# Patient Record
Sex: Female | Born: 1955 | Race: White | Hispanic: No | Marital: Married | Smoking: Former smoker
Health system: Southern US, Community
[De-identification: ages and names within clinical notes are randomized; demographics above are authoritative.]

## PROBLEM LIST (undated history)

## (undated) DIAGNOSIS — K219 Gastro-esophageal reflux disease without esophagitis: Secondary | ICD-10-CM

## (undated) DIAGNOSIS — F419 Anxiety disorder, unspecified: Secondary | ICD-10-CM

## (undated) DIAGNOSIS — I619 Nontraumatic intracerebral hemorrhage, unspecified: Secondary | ICD-10-CM

## (undated) DIAGNOSIS — D649 Anemia, unspecified: Secondary | ICD-10-CM

## (undated) DIAGNOSIS — F32A Depression, unspecified: Secondary | ICD-10-CM

## (undated) DIAGNOSIS — M879 Osteonecrosis, unspecified: Secondary | ICD-10-CM

## (undated) DIAGNOSIS — F329 Major depressive disorder, single episode, unspecified: Secondary | ICD-10-CM

## (undated) DIAGNOSIS — D709 Neutropenia, unspecified: Secondary | ICD-10-CM

## (undated) DIAGNOSIS — G629 Polyneuropathy, unspecified: Secondary | ICD-10-CM

## (undated) DIAGNOSIS — I639 Cerebral infarction, unspecified: Secondary | ICD-10-CM

## (undated) DIAGNOSIS — I10 Essential (primary) hypertension: Secondary | ICD-10-CM

## (undated) DIAGNOSIS — B370 Candidal stomatitis: Secondary | ICD-10-CM

## (undated) DIAGNOSIS — R51 Headache: Secondary | ICD-10-CM

## (undated) DIAGNOSIS — C801 Malignant (primary) neoplasm, unspecified: Secondary | ICD-10-CM

## (undated) DIAGNOSIS — M1990 Unspecified osteoarthritis, unspecified site: Secondary | ICD-10-CM

## (undated) DIAGNOSIS — K5790 Diverticulosis of intestine, part unspecified, without perforation or abscess without bleeding: Secondary | ICD-10-CM

## (undated) DIAGNOSIS — R519 Headache, unspecified: Secondary | ICD-10-CM

## (undated) DIAGNOSIS — R569 Unspecified convulsions: Secondary | ICD-10-CM

## (undated) DIAGNOSIS — R5081 Fever presenting with conditions classified elsewhere: Secondary | ICD-10-CM

## (undated) DIAGNOSIS — Z8719 Personal history of other diseases of the digestive system: Secondary | ICD-10-CM

## (undated) HISTORY — DX: Cerebral infarction, unspecified: I63.9

## (undated) HISTORY — PX: MASTECTOMY: SHX3

## (undated) HISTORY — DX: Unspecified convulsions: R56.9

---

## 1981-07-14 HISTORY — PX: TUBAL LIGATION: SHX77

## 1984-07-14 DIAGNOSIS — I619 Nontraumatic intracerebral hemorrhage, unspecified: Secondary | ICD-10-CM

## 1984-07-14 HISTORY — DX: Nontraumatic intracerebral hemorrhage, unspecified: I61.9

## 1984-07-14 HISTORY — PX: CEREBRAL ANGIOGRAM: SHX1326

## 2016-03-28 DIAGNOSIS — C50912 Malignant neoplasm of unspecified site of left female breast: Secondary | ICD-10-CM

## 2016-04-13 HISTORY — PX: OTHER SURGICAL HISTORY: SHX169

## 2016-05-01 DIAGNOSIS — Z171 Estrogen receptor negative status [ER-]: Secondary | ICD-10-CM

## 2016-05-01 DIAGNOSIS — C50812 Malignant neoplasm of overlapping sites of left female breast: Secondary | ICD-10-CM

## 2016-05-01 DIAGNOSIS — C7951 Secondary malignant neoplasm of bone: Secondary | ICD-10-CM

## 2016-05-09 DIAGNOSIS — C50812 Malignant neoplasm of overlapping sites of left female breast: Secondary | ICD-10-CM

## 2016-05-25 DIAGNOSIS — C50812 Malignant neoplasm of overlapping sites of left female breast: Secondary | ICD-10-CM

## 2016-05-25 DIAGNOSIS — K219 Gastro-esophageal reflux disease without esophagitis: Secondary | ICD-10-CM | POA: Diagnosis not present

## 2016-05-25 DIAGNOSIS — I10 Essential (primary) hypertension: Secondary | ICD-10-CM | POA: Diagnosis not present

## 2016-05-25 DIAGNOSIS — R5081 Fever presenting with conditions classified elsewhere: Secondary | ICD-10-CM

## 2016-05-25 DIAGNOSIS — F419 Anxiety disorder, unspecified: Secondary | ICD-10-CM | POA: Diagnosis not present

## 2016-05-25 DIAGNOSIS — C50919 Malignant neoplasm of unspecified site of unspecified female breast: Secondary | ICD-10-CM

## 2016-05-25 DIAGNOSIS — C7951 Secondary malignant neoplasm of bone: Secondary | ICD-10-CM

## 2016-05-25 DIAGNOSIS — D709 Neutropenia, unspecified: Secondary | ICD-10-CM | POA: Diagnosis not present

## 2016-05-25 DIAGNOSIS — D701 Agranulocytosis secondary to cancer chemotherapy: Secondary | ICD-10-CM

## 2016-05-25 DIAGNOSIS — B370 Candidal stomatitis: Secondary | ICD-10-CM | POA: Diagnosis not present

## 2016-05-26 DIAGNOSIS — C50919 Malignant neoplasm of unspecified site of unspecified female breast: Secondary | ICD-10-CM | POA: Diagnosis not present

## 2016-05-26 DIAGNOSIS — D709 Neutropenia, unspecified: Secondary | ICD-10-CM | POA: Diagnosis not present

## 2016-05-26 DIAGNOSIS — C7951 Secondary malignant neoplasm of bone: Secondary | ICD-10-CM | POA: Diagnosis not present

## 2016-05-26 DIAGNOSIS — I10 Essential (primary) hypertension: Secondary | ICD-10-CM | POA: Diagnosis not present

## 2016-05-27 DIAGNOSIS — D709 Neutropenia, unspecified: Secondary | ICD-10-CM | POA: Diagnosis not present

## 2016-05-27 DIAGNOSIS — C7951 Secondary malignant neoplasm of bone: Secondary | ICD-10-CM | POA: Diagnosis not present

## 2016-05-27 DIAGNOSIS — B370 Candidal stomatitis: Secondary | ICD-10-CM

## 2016-05-27 DIAGNOSIS — C50919 Malignant neoplasm of unspecified site of unspecified female breast: Secondary | ICD-10-CM | POA: Diagnosis not present

## 2016-05-27 DIAGNOSIS — I10 Essential (primary) hypertension: Secondary | ICD-10-CM | POA: Diagnosis not present

## 2016-05-28 DIAGNOSIS — I10 Essential (primary) hypertension: Secondary | ICD-10-CM | POA: Diagnosis not present

## 2016-05-28 DIAGNOSIS — D709 Neutropenia, unspecified: Secondary | ICD-10-CM

## 2016-05-28 DIAGNOSIS — C7951 Secondary malignant neoplasm of bone: Secondary | ICD-10-CM

## 2016-05-28 DIAGNOSIS — K219 Gastro-esophageal reflux disease without esophagitis: Secondary | ICD-10-CM

## 2016-05-28 DIAGNOSIS — B370 Candidal stomatitis: Secondary | ICD-10-CM

## 2016-05-28 DIAGNOSIS — F419 Anxiety disorder, unspecified: Secondary | ICD-10-CM

## 2016-05-28 DIAGNOSIS — C50919 Malignant neoplasm of unspecified site of unspecified female breast: Secondary | ICD-10-CM

## 2016-06-03 DIAGNOSIS — D701 Agranulocytosis secondary to cancer chemotherapy: Secondary | ICD-10-CM | POA: Diagnosis not present

## 2016-06-03 DIAGNOSIS — C7951 Secondary malignant neoplasm of bone: Secondary | ICD-10-CM | POA: Diagnosis not present

## 2016-06-03 DIAGNOSIS — C50812 Malignant neoplasm of overlapping sites of left female breast: Secondary | ICD-10-CM

## 2016-06-24 DIAGNOSIS — C50812 Malignant neoplasm of overlapping sites of left female breast: Secondary | ICD-10-CM | POA: Diagnosis not present

## 2016-06-24 DIAGNOSIS — C7951 Secondary malignant neoplasm of bone: Secondary | ICD-10-CM | POA: Diagnosis not present

## 2016-07-16 DIAGNOSIS — C50812 Malignant neoplasm of overlapping sites of left female breast: Secondary | ICD-10-CM | POA: Diagnosis not present

## 2016-07-16 DIAGNOSIS — C7951 Secondary malignant neoplasm of bone: Secondary | ICD-10-CM | POA: Diagnosis not present

## 2016-08-06 DIAGNOSIS — C773 Secondary and unspecified malignant neoplasm of axilla and upper limb lymph nodes: Secondary | ICD-10-CM

## 2016-08-06 DIAGNOSIS — C50812 Malignant neoplasm of overlapping sites of left female breast: Secondary | ICD-10-CM | POA: Diagnosis not present

## 2016-08-06 DIAGNOSIS — C7951 Secondary malignant neoplasm of bone: Secondary | ICD-10-CM

## 2016-09-17 DIAGNOSIS — Z171 Estrogen receptor negative status [ER-]: Secondary | ICD-10-CM | POA: Diagnosis not present

## 2016-09-17 DIAGNOSIS — G629 Polyneuropathy, unspecified: Secondary | ICD-10-CM | POA: Diagnosis not present

## 2016-09-17 DIAGNOSIS — D701 Agranulocytosis secondary to cancer chemotherapy: Secondary | ICD-10-CM | POA: Diagnosis not present

## 2016-09-17 DIAGNOSIS — D649 Anemia, unspecified: Secondary | ICD-10-CM | POA: Diagnosis not present

## 2016-09-17 DIAGNOSIS — C7951 Secondary malignant neoplasm of bone: Secondary | ICD-10-CM | POA: Diagnosis not present

## 2016-09-17 DIAGNOSIS — C773 Secondary and unspecified malignant neoplasm of axilla and upper limb lymph nodes: Secondary | ICD-10-CM | POA: Diagnosis not present

## 2016-09-17 DIAGNOSIS — N6452 Nipple discharge: Secondary | ICD-10-CM | POA: Diagnosis not present

## 2016-09-17 DIAGNOSIS — C50812 Malignant neoplasm of overlapping sites of left female breast: Secondary | ICD-10-CM | POA: Diagnosis not present

## 2016-10-08 DIAGNOSIS — C50812 Malignant neoplasm of overlapping sites of left female breast: Secondary | ICD-10-CM

## 2016-10-08 DIAGNOSIS — D6481 Anemia due to antineoplastic chemotherapy: Secondary | ICD-10-CM

## 2016-10-08 DIAGNOSIS — C7951 Secondary malignant neoplasm of bone: Secondary | ICD-10-CM | POA: Diagnosis not present

## 2016-10-08 DIAGNOSIS — M8788 Other osteonecrosis, other site: Secondary | ICD-10-CM

## 2016-10-08 DIAGNOSIS — G620 Drug-induced polyneuropathy: Secondary | ICD-10-CM | POA: Diagnosis not present

## 2016-10-29 DIAGNOSIS — N6452 Nipple discharge: Secondary | ICD-10-CM | POA: Diagnosis not present

## 2016-10-29 DIAGNOSIS — C7951 Secondary malignant neoplasm of bone: Secondary | ICD-10-CM | POA: Diagnosis not present

## 2016-10-29 DIAGNOSIS — C50812 Malignant neoplasm of overlapping sites of left female breast: Secondary | ICD-10-CM | POA: Diagnosis not present

## 2016-11-11 HISTORY — PX: BREAST SURGERY: SHX581

## 2016-12-03 DIAGNOSIS — C7951 Secondary malignant neoplasm of bone: Secondary | ICD-10-CM

## 2016-12-03 DIAGNOSIS — C50812 Malignant neoplasm of overlapping sites of left female breast: Secondary | ICD-10-CM | POA: Diagnosis not present

## 2017-01-05 ENCOUNTER — Other Ambulatory Visit: Payer: Self-pay | Admitting: Neurosurgery

## 2017-01-05 DIAGNOSIS — I671 Cerebral aneurysm, nonruptured: Secondary | ICD-10-CM

## 2017-01-20 ENCOUNTER — Ambulatory Visit (HOSPITAL_COMMUNITY)
Admission: RE | Admit: 2017-01-20 | Discharge: 2017-01-20 | Disposition: A | Discharge status: Home / Self Care | Payer: Medicaid Other | Source: Ambulatory Visit | Attending: Neurosurgery | Admitting: Neurosurgery

## 2017-01-20 ENCOUNTER — Other Ambulatory Visit: Payer: Self-pay | Admitting: Neurosurgery

## 2017-01-20 ENCOUNTER — Encounter (HOSPITAL_COMMUNITY): Payer: Self-pay | Admitting: Radiology

## 2017-01-20 DIAGNOSIS — I671 Cerebral aneurysm, nonruptured: Secondary | ICD-10-CM | POA: Diagnosis not present

## 2017-01-20 DIAGNOSIS — C50919 Malignant neoplasm of unspecified site of unspecified female breast: Secondary | ICD-10-CM | POA: Diagnosis not present

## 2017-01-20 DIAGNOSIS — Z87891 Personal history of nicotine dependence: Secondary | ICD-10-CM | POA: Diagnosis not present

## 2017-01-20 HISTORY — PX: IR ANGIO INTRA EXTRACRAN SEL INTERNAL CAROTID BILAT MOD SED: IMG5363

## 2017-01-20 HISTORY — PX: IR ANGIO VERTEBRAL SEL VERTEBRAL BILAT MOD SED: IMG5369

## 2017-01-20 HISTORY — PX: IR 3D INDEPENDENT WKST: IMG2385

## 2017-01-20 LAB — APTT: aPTT: 22 seconds — ABNORMAL LOW (ref 24–36)

## 2017-01-20 LAB — URINALYSIS, ROUTINE W REFLEX MICROSCOPIC
Bilirubin Urine: NEGATIVE
GLUCOSE, UA: NEGATIVE mg/dL
Hgb urine dipstick: NEGATIVE
KETONES UR: NEGATIVE mg/dL
LEUKOCYTES UA: NEGATIVE
NITRITE: NEGATIVE
PROTEIN: NEGATIVE mg/dL
Specific Gravity, Urine: 1.014 (ref 1.005–1.030)
pH: 8 (ref 5.0–8.0)

## 2017-01-20 LAB — CBC WITH DIFFERENTIAL/PLATELET
BASOS ABS: 0 10*3/uL (ref 0.0–0.1)
BASOS PCT: 0 %
EOS ABS: 0.1 10*3/uL (ref 0.0–0.7)
Eosinophils Relative: 2 %
HEMATOCRIT: 37.6 % (ref 36.0–46.0)
HEMOGLOBIN: 11.9 g/dL — AB (ref 12.0–15.0)
Lymphocytes Relative: 47 %
Lymphs Abs: 1.5 10*3/uL (ref 0.7–4.0)
MCH: 28.7 pg (ref 26.0–34.0)
MCHC: 31.6 g/dL (ref 30.0–36.0)
MCV: 90.8 fL (ref 78.0–100.0)
Monocytes Absolute: 0.1 10*3/uL (ref 0.1–1.0)
Monocytes Relative: 3 %
NEUTROS ABS: 1.5 10*3/uL — AB (ref 1.7–7.7)
NEUTROS PCT: 48 %
Platelets: 295 10*3/uL (ref 150–400)
RBC: 4.14 MIL/uL (ref 3.87–5.11)
RDW: 14.3 % (ref 11.5–15.5)
WBC: 3.1 10*3/uL — AB (ref 4.0–10.5)

## 2017-01-20 LAB — BASIC METABOLIC PANEL
ANION GAP: 9 (ref 5–15)
BUN: 11 mg/dL (ref 6–20)
CALCIUM: 9.6 mg/dL (ref 8.9–10.3)
CO2: 24 mmol/L (ref 22–32)
CREATININE: 0.87 mg/dL (ref 0.44–1.00)
Chloride: 104 mmol/L (ref 101–111)
Glucose, Bld: 99 mg/dL (ref 65–99)
Potassium: 4.6 mmol/L (ref 3.5–5.1)
SODIUM: 137 mmol/L (ref 135–145)

## 2017-01-20 LAB — PROTIME-INR
INR: 0.96
PROTHROMBIN TIME: 12.8 s (ref 11.4–15.2)

## 2017-01-20 MED ORDER — CHLORHEXIDINE GLUCONATE CLOTH 2 % EX PADS: 6.0000 | MEDICATED_PAD | Freq: Once | CUTANEOUS | Status: DC

## 2017-01-20 MED ORDER — CEFAZOLIN SODIUM-DEXTROSE 2-4 GM/100ML-% IV SOLN: 2.0000 g | INTRAVENOUS | Status: DC

## 2017-01-20 MED ORDER — LIDOCAINE HCL 1 % IJ SOLN
INTRAMUSCULAR | Status: DC | PRN
  Administered 2017-01-20: 10 mL

## 2017-01-20 MED ORDER — CEFAZOLIN SODIUM-DEXTROSE 2-4 GM/100ML-% IV SOLN
INTRAVENOUS | Status: AC
  Filled 2017-01-20: qty 100

## 2017-01-20 MED ORDER — LIDOCAINE HCL (PF) 1 % IJ SOLN
INTRAMUSCULAR | Status: AC
  Filled 2017-01-20: qty 30

## 2017-01-20 MED ORDER — FENTANYL CITRATE (PF) 100 MCG/2ML IJ SOLN
INTRAMUSCULAR | Status: AC
  Filled 2017-01-20: qty 2

## 2017-01-20 MED ORDER — MIDAZOLAM HCL 2 MG/2ML IJ SOLN
INTRAMUSCULAR | Status: AC | PRN
  Administered 2017-01-20: 1 mg via INTRAVENOUS

## 2017-01-20 MED ORDER — FENTANYL CITRATE (PF) 100 MCG/2ML IJ SOLN
INTRAMUSCULAR | Status: AC | PRN
  Administered 2017-01-20: 25 ug via INTRAVENOUS

## 2017-01-20 MED ORDER — MIDAZOLAM HCL 2 MG/2ML IJ SOLN
INTRAMUSCULAR | Status: AC
  Filled 2017-01-20: qty 2

## 2017-01-20 MED ORDER — IOPAMIDOL (ISOVUE-300) INJECTION 61%
INTRAVENOUS | Status: AC
  Administered 2017-01-20: 35 mL
  Filled 2017-01-20: qty 100

## 2017-01-20 MED ORDER — SODIUM CHLORIDE 0.9 % IV SOLN: INTRAVENOUS | Status: DC

## 2017-01-20 MED ORDER — HYDROCODONE-ACETAMINOPHEN 5-325 MG PO TABS: 1.0000 | ORAL_TABLET | ORAL | Status: DC | PRN

## 2017-01-20 MED ORDER — HEPARIN SODIUM (PORCINE) 1000 UNIT/ML IJ SOLN
INTRAMUSCULAR | Status: AC
  Filled 2017-01-20: qty 2

## 2017-01-20 MED ORDER — HEPARIN SODIUM (PORCINE) 1000 UNIT/ML IJ SOLN
INTRAMUSCULAR | Status: AC | PRN
  Administered 2017-01-20: 2000 [IU] via INTRAVENOUS

## 2017-01-20 NOTE — Sedation Documentation (Signed)
Patient is resting comfortably. 

## 2017-01-20 NOTE — Discharge Instructions (Signed)
Cerebral Angiogram, Care After °Refer to this sheet in the next few weeks. These instructions provide you with information on caring for yourself after your procedure. Your health care provider may also give you more specific instructions. Your treatment has been planned according to current medical practices, but problems sometimes occur. Call your health care provider if you have any problems or questions after your procedure. °What can I expect after the procedure? °After your procedure, it is typical to have the following: °· Bruising at the catheter insertion site that usually fades within 1-2 weeks. °· Blood collecting in the tissue (hematoma) that may be painful to the touch. It should usually decrease in size and tenderness within 1-2 weeks. °· A mild headache. ° °Follow these instructions at home: °· Take medicines only as directed by your health care provider. °· You may shower 24-48 hours after the procedure or as directed by your health care provider. Remove the bandage (dressing) and gently wash the site with plain soap and water. Pat the area dry with a clean towel. Do not rub the site, because this may cause bleeding. °· Do not take baths, swim, or use a hot tub until your health care provider approves. °· Check your insertion site every day for redness, swelling, or drainage. °· Do not apply powder or lotion to the site. °· Do not lift over 10 lb (4.5 kg) for 5 days after your procedure or as directed by your health care provider. °· Ask your health care provider when it is okay to: °? Return to work or school. °? Resume usual physical activities or sports. °? Resume sexual activity. °· Do not drive home if you are discharged the same day as the procedure. Have someone else drive you. °· You may drive 24 hours after the procedure unless otherwise instructed by your health care provider. °· Do not operate machinery or power tools for 24 hours after the procedure or as directed by your health care  provider. °· If your procedure was done as an outpatient procedure, which means that you went home the same day as your procedure, a responsible adult should be with you for the first 24 hours after you arrive home. °· Keep all follow-up visits as directed by your health care provider. This is important. °Contact a health care provider if: °· You have a fever. °· You have chills. °· You have increased bleeding from the catheter insertion site. Hold pressure on the site. °Get help right away if: °· You have vision changes or loss of vision. °· You have numbness or weakness on one side of your body. °· You have difficulty talking, or you have slurred speech or cannot speak (aphasia). °· You feel confused or have difficulty remembering. °· You have unusual pain at the catheter insertion site. °· You have redness, warmth, or swelling at the catheter insertion site. °· You have drainage (other than a small amount of blood on the dressing) from the catheter insertion site. °· The catheter insertion site is bleeding, and the bleeding does not stop after 30 minutes of holding steady pressure on the site. °These symptoms may represent a serious problem that is an emergency. Do not wait to see if the symptoms will go away. Get medical help right away. Call your local emergency services (911 in [place] Do not drive yourself to the hospital. °This information is not intended to replace advice given to you by your health care provider. Make sure you discuss any questions   you have with your health care provider. °Document Released: 11/14/2013 Document Revised: 12/06/2015 Document Reviewed: 07/13/2013 °Elsevier Interactive Patient Education © 2017 [employer] °Moderate Conscious Sedation, Adult, Care After °These instructions provide you with information about caring for yourself after your procedure. Your health care provider may also give you more specific instructions. Your treatment has been planned according to current  medical practices, but problems sometimes occur. Call your health care provider if you have any problems or questions after your procedure. °What can I expect after the procedure? °After your procedure, it is common: °· To feel sleepy for several hours. °· To feel clumsy and have poor balance for several hours. °· To have poor judgment for several hours. °· To vomit if you eat too soon. ° °Follow these instructions at home: °For at least 24 hours after the procedure: ° °· Do not: °? Participate in activities where you could fall or become injured. °? Drive. °? Use heavy machinery. °? Drink alcohol. °? Take sleeping pills or medicines that cause drowsiness. °? Make important decisions or sign legal documents. °? Take care of children on your own. °· Rest. °Eating and drinking °· Follow the diet recommended by your health care provider. °· If you vomit: °? Drink water, juice, or soup when you can drink without vomiting. °? Make sure you have little or no nausea before eating solid foods. °General instructions °· Have a responsible adult stay with you until you are awake and alert. °· Take over-the-counter and prescription medicines only as told by your health care provider. °· If you smoke, do not smoke without supervision. °· Keep all follow-up visits as told by your health care provider. This is important. °Contact a health care provider if: °· You keep feeling nauseous or you keep vomiting. °· You feel light-headed. °· You develop a rash. °· You have a fever. °Get help right away if: °· You have trouble breathing. °This information is not intended to replace advice given to you by your health care provider. Make sure you discuss any questions you have with your health care provider. °Document Released: 04/20/2013 Document Revised: 12/03/2015 Document Reviewed: 10/20/2015 °Elsevier Interactive Patient Education © 2018 [employer] ° °

## 2017-01-20 NOTE — H&P (Signed)
CC:  History of brain aneurysm  HPI: Diane Short is a 61 year old woman I am seeing for a history of aneurysm. She comes in after recent CT scan demonstrated a right-sided aneurysm. Briefly, she is currently under treatment for metastatic breast cancer. She has recently undergone mastectomy for symptomatic relief. She was on clinical trial chemotherapy, but follow-up scan revealed progression of her disease. She has therefore recently been started on Taxol. 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 At that time she said she underwent a diagnostic angiogram and subsequent right-sided temporal craniotomy. She was told that the aneurysm was "covered." She was not told about any clipping of the aneurysm. Of note, the patient has a history of tobacco smoking but quit this past November. She does have a medical history of high blood pressure which is reasonably well controlled medically. She has a strong family history of intracranial aneurysms including her father, a 1st cousin, and another family member on her father side.   PMH: No past medical history on file.  PSH: No past surgical history on file.  SH: Social History  Substance Use Topics  . Smoking status: Not on file  . Smokeless tobacco: Not on file  . Alcohol use Not on file    MEDS: Prior to Admission medications   Medication Sig Start Date End Date Taking? Authorizing Provider  acetaminophen (TYLENOL) 500 MG tablet Take 1,000 mg by mouth every 6 (six) hours as needed for mild pain.   Yes [provider]  Calcium Carbonate-Vitamin D (CALCIUM PLUS VITAMIN D PO) Take 1 tablet by mouth 3 (three) times daily.   Yes [provider]  carvedilol (COREG) 12.5 MG tablet Take 12.5 mg by mouth 2 (two) times daily with a meal.   Yes [provider]  citalopram (CELEXA) 20 MG tablet Take 20 mg by mouth daily.   Yes [provider]  dexamethasone (DECADRON) 4 MG  tablet Take 12 mg by mouth See admin instructions. Take 3 tabs the night before chemo and 3 tabs the morning of chemo   Yes [provider]  diphenhydrAMINE (BENADRYL) 25 MG tablet Take 25 mg by mouth at bedtime as needed for sleep.   Yes [provider]  loperamide (IMODIUM A-D) 2 MG tablet Take 2-4 mg by mouth daily as needed for diarrhea or loose stools.   Yes [provider]  omeprazole (PRILOSEC) 20 MG capsule Take 20 mg by mouth daily.   Yes [provider]  ondansetron (ZOFRAN) 4 MG tablet Take 4 mg by mouth every 4 (four) hours as needed for nausea or vomiting.   Yes [provider]  PACLitaxel (TAXOL IV) Inject into the vein. Takes once a week for 3 weeks then off for a week   Yes [provider]  prochlorperazine (COMPAZINE) 10 MG tablet Take 10 mg by mouth every 6 (six) hours as needed for nausea or vomiting.   Yes [provider]  loratadine-pseudoephedrine (CLARITIN-D 24 HOUR) 10-240 MG 24 hr tablet Take 1 tablet by mouth daily as needed for allergies.    [provider]    ALLERGY: Allergies  Allergen Reactions  . Nsaids Other (See Comments)    GI upset   . Ace Inhibitors Other (See Comments)    cough    ROS: ROS  NEUROLOGIC EXAM: Awake, alert, oriented Memory and concentration grossly intact Speech fluent, appropriate CN grossly intact Motor exam: Upper Extremities Deltoid Bicep Tricep  Grip  Right 5/5 5/5 5/5 5/5  Left 5/5 5/5 5/5 5/5   Lower Extremity IP Quad PF DF EHL  Right 5/5 5/5 5/5 5/5 5/5  Left 5/5 5/5 5/5 5/5 5/5   Sensation grossly intact to LT  [facility]: CT scan of the brain with and without contrast was reviewed. This appears to demonstrate a right middle cerebral artery bifurcation aneurysm measuring at least 14-15 mm in maximal dimension. There is evidence of prior right-sided frontotemporal craniotomy. Of note, there is no metal clip identified in proximity to this aneurysm.  There are metal cable securing the craniotomy flap.   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 She also has other risk factors including history of smoking, and strong family history. Unfortunately, she is currently on treatment for stage IV metastatic breast cancer. At this point, I do not know exactly what her life expectancy would be given her metastatic breast cancer. Certainly, under normal circumstances in a patient who does not have metastatic cancer, this is an aneurysm that should be treated.   PLAN: we will plan on proceeding with diagnostic cerebral angiogram in an attempt to further characterize this aneurysm.   Risks of the angiogram were discussed in the office, including the small risk of stroke, hemorrhage, dissection, contrast reaction, nephropathy, and groin hematoma. All her questions were answered.

## 2017-01-21 ENCOUNTER — Encounter (HOSPITAL_COMMUNITY): Payer: Self-pay | Admitting: Neurosurgery

## 2017-01-29 DIAGNOSIS — C50812 Malignant neoplasm of overlapping sites of left female breast: Secondary | ICD-10-CM

## 2017-01-29 DIAGNOSIS — C7951 Secondary malignant neoplasm of bone: Secondary | ICD-10-CM | POA: Diagnosis not present

## 2017-02-17 ENCOUNTER — Other Ambulatory Visit (HOSPITAL_COMMUNITY): Payer: Self-pay | Admitting: Neurosurgery

## 2017-02-17 DIAGNOSIS — I671 Cerebral aneurysm, nonruptured: Secondary | ICD-10-CM

## 2017-03-02 HISTORY — PX: CATARACT EXTRACTION: SUR2

## 2017-03-03 ENCOUNTER — Other Ambulatory Visit: Payer: Self-pay | Admitting: Neurosurgery

## 2017-03-05 ENCOUNTER — Encounter (HOSPITAL_COMMUNITY): Payer: Self-pay

## 2017-03-05 ENCOUNTER — Encounter (HOSPITAL_COMMUNITY)
Admission: RE | Admit: 2017-03-05 | Discharge: 2017-03-05 | Disposition: A | Discharge status: Home / Self Care | Payer: Medicaid Other | Source: Ambulatory Visit | Attending: Neurosurgery | Admitting: Neurosurgery

## 2017-03-05 DIAGNOSIS — I671 Cerebral aneurysm, nonruptured: Secondary | ICD-10-CM | POA: Diagnosis not present

## 2017-03-05 HISTORY — DX: Unspecified osteoarthritis, unspecified site: M19.90

## 2017-03-05 HISTORY — DX: Personal history of other diseases of the digestive system: Z87.19

## 2017-03-05 HISTORY — DX: Anxiety disorder, unspecified: F41.9

## 2017-03-05 HISTORY — DX: Essential (primary) hypertension: I10

## 2017-03-05 HISTORY — DX: Malignant (primary) neoplasm, unspecified: C80.1

## 2017-03-05 HISTORY — DX: Nontraumatic intracerebral hemorrhage, unspecified: I61.9

## 2017-03-05 HISTORY — DX: Gastro-esophageal reflux disease without esophagitis: K21.9

## 2017-03-05 HISTORY — DX: Anemia, unspecified: D64.9

## 2017-03-05 LAB — CBC WITH DIFFERENTIAL/PLATELET
BASOS PCT: 0 %
Basophils Absolute: 0 10*3/uL (ref 0.0–0.1)
EOS ABS: 0.2 10*3/uL (ref 0.0–0.7)
Eosinophils Relative: 2 %
HCT: 38.3 % (ref 36.0–46.0)
Hemoglobin: 12.1 g/dL (ref 12.0–15.0)
LYMPHS ABS: 2.6 10*3/uL (ref 0.7–4.0)
Lymphocytes Relative: 29 %
MCH: 28.8 pg (ref 26.0–34.0)
MCHC: 31.6 g/dL (ref 30.0–36.0)
MCV: 91.2 fL (ref 78.0–100.0)
MONO ABS: 0.6 10*3/uL (ref 0.1–1.0)
MONOS PCT: 7 %
NEUTROS PCT: 62 %
Neutro Abs: 5.6 10*3/uL (ref 1.7–7.7)
PLATELETS: 269 10*3/uL (ref 150–400)
RBC: 4.2 MIL/uL (ref 3.87–5.11)
RDW: 15.9 % — AB (ref 11.5–15.5)
WBC: 9.1 10*3/uL (ref 4.0–10.5)

## 2017-03-05 LAB — URINALYSIS, ROUTINE W REFLEX MICROSCOPIC
Bilirubin Urine: NEGATIVE
Glucose, UA: NEGATIVE mg/dL
Hgb urine dipstick: NEGATIVE
Ketones, ur: NEGATIVE mg/dL
Nitrite: NEGATIVE
PH: 6 (ref 5.0–8.0)
Protein, ur: NEGATIVE mg/dL
SPECIFIC GRAVITY, URINE: 1.006 (ref 1.005–1.030)

## 2017-03-05 LAB — BASIC METABOLIC PANEL
Anion gap: 9 (ref 5–15)
BUN: 10 mg/dL (ref 6–20)
CALCIUM: 9.9 mg/dL (ref 8.9–10.3)
CHLORIDE: 100 mmol/L — AB (ref 101–111)
CO2: 28 mmol/L (ref 22–32)
CREATININE: 0.96 mg/dL (ref 0.44–1.00)
GFR calc Af Amer: >60 mL/min (ref >60)
GFR calc non Af Amer: >60 mL/min (ref >60)
GLUCOSE: 98 mg/dL (ref 65–99)
Potassium: 4.5 mmol/L (ref 3.5–5.1)
Sodium: 137 mmol/L (ref 135–145)

## 2017-03-05 LAB — APTT: aPTT: 27 seconds (ref 24–36)

## 2017-03-05 LAB — PROTIME-INR
INR: 1
PROTHROMBIN TIME: 13.2 s (ref 11.4–15.2)

## 2017-03-05 NOTE — Progress Notes (Signed)
PCP: Dr. Alanda Amass  Request ekg from [facility]

## 2017-03-05 NOTE — Pre-Procedure Instructions (Addendum)
Diane Short  03/05/2017      [facility] [address], [place] [address] [place] [address] Phone: [phone] Fax: [phone]    Your procedure is scheduled on Aug. 31  Report to [facility] at 6:30 A.M.  Call this number if you have problems the morning of surgery:  [phone]   Remember:  Do not eat food or drink liquids after midnight on Thurs. Aug. 30   Take these medicines the morning of surgery with A SIP OF WATER : tylenol if needed, carvedilol(coreg), aspirin, celexa, plavix, ativan if needed, omeprazole, eye drops               Stop 1 week prior to surgery: vitamins and herbal medicines, fish oil                 Do not wear jewelry, make-up or nail polish.  Do not wear lotions, powders, or perfumes, or deoderant.  Do not shave 48 hours prior to surgery.  Men may shave face and neck.  Do not bring valuables to the hospital.  [facility] is not responsible for any belongings or valuables.  Contacts, dentures or bridgework may not be worn into surgery.  Leave your suitcase in the car.  After surgery it may be brought to your room.  For patients admitted to the hospital, discharge time will be determined by your treatment team.  Patients discharged the day of surgery will not be allowed to drive home.    Special instructions:   [facility]- Preparing For Surgery  Before surgery, you can play an important role. Because skin is not sterile, your skin needs to be as free of germs as possible. You can reduce the number of germs on your skin by washing with CHG (chlorahexidine gluconate) Soap before surgery.  CHG is an antiseptic cleaner which kills germs and bonds with the skin to continue killing germs even after washing.  Please do not use if you have an allergy to CHG or antibacterial soaps. If your skin becomes reddened/irritated stop using the CHG.  Do not shave (including legs and underarms)  for at least 48 hours prior to first CHG shower. It is OK to shave your face.  Please follow these instructions carefully.   1. Shower the NIGHT BEFORE SURGERY and the MORNING OF SURGERY with CHG.   2. If you chose to wash your hair, wash your hair first as usual with your normal shampoo.  3. After you shampoo, rinse your hair and body thoroughly to remove the shampoo.  4. Use CHG as you would any other liquid soap. You can apply CHG directly to the skin and wash gently with a scrungie or a clean washcloth.   5. Apply the CHG Soap to your body ONLY FROM THE NECK DOWN.  Do not use on open wounds or open sores. Avoid contact with your eyes, ears, mouth and genitals (private parts). Wash genitals (private parts) with your normal soap.  6. Wash thoroughly, paying special attention to the area where your surgery will be performed.  7. Thoroughly rinse your body with warm water from the neck down.  8. DO NOT shower/wash with your normal soap after using and rinsing off the CHG Soap.  9. Pat yourself dry with a CLEAN TOWEL.   10. Wear CLEAN PAJAMAS   11. Place CLEAN SHEETS on your bed the night of your first shower and  DO NOT SLEEP WITH PETS.    Day of Surgery: Do not apply any deodorants/lotions. Please wear clean clothes to the hospital/surgery center.      Please read over the following fact sheets that you were given. Coughing and Deep Breathing and Surgical Site Infection Prevention

## 2017-03-13 ENCOUNTER — Encounter (HOSPITAL_COMMUNITY): Payer: Self-pay | Admitting: Certified Registered Nurse Anesthetist

## 2017-03-13 ENCOUNTER — Inpatient Hospital Stay (HOSPITAL_COMMUNITY): Payer: Medicaid Other | Admitting: Certified Registered Nurse Anesthetist

## 2017-03-13 ENCOUNTER — Ambulatory Visit (HOSPITAL_COMMUNITY)
Admission: RE | Admit: 2017-03-13 | Discharge: 2017-03-13 | Disposition: A | Discharge status: Home / Self Care | Payer: Medicaid Other | Source: Ambulatory Visit | Attending: Neurosurgery | Admitting: Neurosurgery

## 2017-03-13 ENCOUNTER — Inpatient Hospital Stay (HOSPITAL_COMMUNITY)
Admission: RE | Admit: 2017-03-13 | Discharge: 2017-03-14 | DRG: 026 | Disposition: A | Discharge status: Home / Self Care | Payer: Medicaid Other | Source: Ambulatory Visit | Attending: Neurosurgery | Admitting: Neurosurgery

## 2017-03-13 ENCOUNTER — Encounter (HOSPITAL_COMMUNITY)
Admission: RE | Disposition: A | Discharge status: Home / Self Care | Payer: Self-pay | Source: Ambulatory Visit | Attending: Neurosurgery

## 2017-03-13 DIAGNOSIS — F17210 Nicotine dependence, cigarettes, uncomplicated: Secondary | ICD-10-CM | POA: Insufficient documentation

## 2017-03-13 DIAGNOSIS — C7951 Secondary malignant neoplasm of bone: Secondary | ICD-10-CM | POA: Diagnosis present

## 2017-03-13 DIAGNOSIS — Z7982 Long term (current) use of aspirin: Secondary | ICD-10-CM

## 2017-03-13 DIAGNOSIS — K219 Gastro-esophageal reflux disease without esophagitis: Secondary | ICD-10-CM | POA: Diagnosis present

## 2017-03-13 DIAGNOSIS — I10 Essential (primary) hypertension: Secondary | ICD-10-CM

## 2017-03-13 DIAGNOSIS — I671 Cerebral aneurysm, nonruptured: Secondary | ICD-10-CM | POA: Diagnosis present

## 2017-03-13 DIAGNOSIS — Z853 Personal history of malignant neoplasm of breast: Secondary | ICD-10-CM

## 2017-03-13 DIAGNOSIS — M1990 Unspecified osteoarthritis, unspecified site: Secondary | ICD-10-CM

## 2017-03-13 DIAGNOSIS — Z79899 Other long term (current) drug therapy: Secondary | ICD-10-CM

## 2017-03-13 DIAGNOSIS — F419 Anxiety disorder, unspecified: Secondary | ICD-10-CM | POA: Diagnosis present

## 2017-03-13 DIAGNOSIS — Z7902 Long term (current) use of antithrombotics/antiplatelets: Secondary | ICD-10-CM

## 2017-03-13 DIAGNOSIS — Z888 Allergy status to other drugs, medicaments and biological substances status: Secondary | ICD-10-CM | POA: Diagnosis not present

## 2017-03-13 DIAGNOSIS — Z886 Allergy status to analgesic agent status: Secondary | ICD-10-CM

## 2017-03-13 HISTORY — PX: RADIOLOGY WITH ANESTHESIA: SHX6223

## 2017-03-13 HISTORY — PX: IR TRANSCATH/EMBOLIZ: IMG695

## 2017-03-13 HISTORY — PX: IR ANGIOGRAM FOLLOW UP STUDY: IMG697

## 2017-03-13 HISTORY — PX: IR NEURO EACH ADD'L AFTER BASIC UNI RIGHT (MS): IMG5374

## 2017-03-13 HISTORY — PX: IR ANGIO INTRA EXTRACRAN SEL INTERNAL CAROTID UNI R MOD SED: IMG5362

## 2017-03-13 LAB — CREATININE, SERUM
CREATININE: 0.85 mg/dL (ref 0.44–1.00)
GFR calc Af Amer: >60 mL/min (ref >60)
GFR calc non Af Amer: >60 mL/min (ref >60)

## 2017-03-13 LAB — CBC
HCT: 34.1 % — ABNORMAL LOW (ref 36.0–46.0)
HEMOGLOBIN: 10.7 g/dL — AB (ref 12.0–15.0)
MCH: 29.2 pg (ref 26.0–34.0)
MCHC: 31.4 g/dL (ref 30.0–36.0)
MCV: 92.9 fL (ref 78.0–100.0)
Platelets: 197 10*3/uL (ref 150–400)
RBC: 3.67 MIL/uL — AB (ref 3.87–5.11)
RDW: 16.2 % — ABNORMAL HIGH (ref 11.5–15.5)
WBC: 9.1 10*3/uL (ref 4.0–10.5)

## 2017-03-13 LAB — MRSA PCR SCREENING: MRSA by PCR: NEGATIVE

## 2017-03-13 SURGERY — RADIOLOGY WITH ANESTHESIA
Anesthesia: General

## 2017-03-13 MED ORDER — HEPARIN SODIUM (PORCINE) 5000 UNIT/ML IJ SOLN
5000.0000 [IU] | Freq: Three times a day (TID) | INTRAMUSCULAR | Status: DC
  Administered 2017-03-14: 5000 [IU] via SUBCUTANEOUS
  Filled 2017-03-13: qty 1

## 2017-03-13 MED ORDER — HYDROMORPHONE HCL 1 MG/ML IJ SOLN: 0.2500 mg | INTRAMUSCULAR | Status: DC | PRN

## 2017-03-13 MED ORDER — ONDANSETRON HCL 4 MG/2ML IJ SOLN
INTRAMUSCULAR | Status: DC | PRN
  Administered 2017-03-13: 4 mg via INTRAVENOUS

## 2017-03-13 MED ORDER — NONFORMULARY OR COMPOUNDED ITEM
1.0000 [drp] | Freq: Two times a day (BID) | Status: DC
  Administered 2017-03-13 – 2017-03-14 (×2): 1 [drp] via OPHTHALMIC
  Filled 2017-03-13: qty 1

## 2017-03-13 MED ORDER — FENTANYL CITRATE (PF) 100 MCG/2ML IJ SOLN
INTRAMUSCULAR | Status: DC | PRN
  Administered 2017-03-13 (×2): 100 ug via INTRAVENOUS

## 2017-03-13 MED ORDER — LABETALOL HCL 5 MG/ML IV SOLN
INTRAVENOUS | Status: AC
  Administered 2017-03-13: 10 mg
  Filled 2017-03-13: qty 4

## 2017-03-13 MED ORDER — LOPERAMIDE HCL 2 MG PO CAPS: 2.0000 mg | ORAL_CAPSULE | Freq: Every day | ORAL | Status: DC | PRN

## 2017-03-13 MED ORDER — PROPOFOL 10 MG/ML IV BOLUS
INTRAVENOUS | Status: DC | PRN
  Administered 2017-03-13: 150 mg via INTRAVENOUS
  Administered 2017-03-13: 50 mg via INTRAVENOUS

## 2017-03-13 MED ORDER — ONDANSETRON HCL 4 MG PO TABS: 4.0000 mg | ORAL_TABLET | ORAL | Status: DC | PRN

## 2017-03-13 MED ORDER — CALCIUM CARBONATE-VITAMIN D 500-200 MG-UNIT PO TABS
1.0000 | ORAL_TABLET | Freq: Two times a day (BID) | ORAL | Status: DC
  Administered 2017-03-13 – 2017-03-14 (×2): 1 via ORAL
  Filled 2017-03-13 (×2): qty 1

## 2017-03-13 MED ORDER — LORAZEPAM 1 MG PO TABS: 0.5000 mg | ORAL_TABLET | ORAL | Status: DC | PRN

## 2017-03-13 MED ORDER — PHENYLEPHRINE HCL 10 MG/ML IJ SOLN
INTRAVENOUS | Status: DC | PRN
  Administered 2017-03-13: 50 ug/min via INTRAVENOUS

## 2017-03-13 MED ORDER — CARVEDILOL 12.5 MG PO TABS
25.0000 mg | ORAL_TABLET | Freq: Two times a day (BID) | ORAL | Status: DC
  Administered 2017-03-13 – 2017-03-14 (×2): 25 mg via ORAL
  Filled 2017-03-13 (×3): qty 2

## 2017-03-13 MED ORDER — IOPAMIDOL (ISOVUE-300) INJECTION 61%
150.0000 mL | Freq: Once | INTRAVENOUS | Status: AC | PRN
  Administered 2017-03-13: 60 mL via INTRA_ARTERIAL

## 2017-03-13 MED ORDER — HYDROCODONE-ACETAMINOPHEN 5-325 MG PO TABS
1.0000 | ORAL_TABLET | ORAL | Status: DC | PRN
Start: 2017-03-13 — End: 2017-03-14

## 2017-03-13 MED ORDER — MEPERIDINE HCL 25 MG/ML IJ SOLN: 6.2500 mg | INTRAMUSCULAR | Status: DC | PRN

## 2017-03-13 MED ORDER — NONFORMULARY OR COMPOUNDED ITEM: 1.0000 [drp] | Freq: Every day | Status: DC

## 2017-03-13 MED ORDER — SODIUM CHLORIDE 0.9 % IV SOLN
INTRAVENOUS | Status: DC
  Administered 2017-03-13 – 2017-03-14 (×2): via INTRAVENOUS

## 2017-03-13 MED ORDER — ARTIFICIAL TEARS OPHTHALMIC OINT
TOPICAL_OINTMENT | OPHTHALMIC | Status: DC | PRN
  Administered 2017-03-13: 1 via OPHTHALMIC

## 2017-03-13 MED ORDER — SODIUM CHLORIDE 0.9 % IV SOLN
INTRAVENOUS | Status: DC | PRN
  Administered 2017-03-13: 12:00:00 via INTRAVENOUS

## 2017-03-13 MED ORDER — LABETALOL HCL 5 MG/ML IV SOLN: 10.0000 mg | INTRAVENOUS | Status: DC | PRN

## 2017-03-13 MED ORDER — CHLORHEXIDINE GLUCONATE CLOTH 2 % EX PADS: 6.0000 | MEDICATED_PAD | Freq: Once | CUTANEOUS | Status: DC

## 2017-03-13 MED ORDER — CEFAZOLIN SODIUM-DEXTROSE 2-4 GM/100ML-% IV SOLN
2.0000 g | INTRAVENOUS | Status: AC
  Administered 2017-03-13: 2 g via INTRAVENOUS
  Filled 2017-03-13: qty 100

## 2017-03-13 MED ORDER — DEXAMETHASONE SODIUM PHOSPHATE 10 MG/ML IJ SOLN
INTRAMUSCULAR | Status: DC | PRN
  Administered 2017-03-13: 5 mg via INTRAVENOUS

## 2017-03-13 MED ORDER — LABETALOL HCL 5 MG/ML IV SOLN
INTRAVENOUS | Status: DC | PRN
  Administered 2017-03-13: 10 mg via INTRAVENOUS
  Administered 2017-03-13 (×2): 5 mg via INTRAVENOUS

## 2017-03-13 MED ORDER — LACTATED RINGERS IV SOLN: INTRAVENOUS | Status: DC

## 2017-03-13 MED ORDER — PREDNISOLON-GATIFLOX-BROMFENAC 1-0.5-0.075 % OP SUSP: 1.0000 [drp] | Freq: Two times a day (BID) | OPHTHALMIC | Status: DC

## 2017-03-13 MED ORDER — MIDAZOLAM HCL 2 MG/2ML IJ SOLN
INTRAMUSCULAR | Status: AC
  Filled 2017-03-13: qty 2

## 2017-03-13 MED ORDER — MIDAZOLAM HCL 2 MG/2ML IJ SOLN
INTRAMUSCULAR | Status: DC | PRN
  Administered 2017-03-13: 2 mg via INTRAVENOUS

## 2017-03-13 MED ORDER — ASPIRIN 325 MG PO TABS
325.0000 mg | ORAL_TABLET | Freq: Every day | ORAL | Status: DC
  Administered 2017-03-14: 325 mg via ORAL
  Filled 2017-03-13: qty 1

## 2017-03-13 MED ORDER — PANTOPRAZOLE SODIUM 40 MG PO TBEC
40.0000 mg | DELAYED_RELEASE_TABLET | Freq: Every day | ORAL | Status: DC
  Administered 2017-03-14: 40 mg via ORAL
  Filled 2017-03-13: qty 1

## 2017-03-13 MED ORDER — DIPHENHYDRAMINE HCL 25 MG PO TABS
25.0000 mg | ORAL_TABLET | Freq: Every evening | ORAL | Status: DC | PRN
Start: 2017-03-13 — End: 2017-03-14
  Filled 2017-03-13: qty 1

## 2017-03-13 MED ORDER — SUGAMMADEX SODIUM 200 MG/2ML IV SOLN
INTRAVENOUS | Status: DC | PRN
  Administered 2017-03-13: 200 mg via INTRAVENOUS

## 2017-03-13 MED ORDER — ROCURONIUM BROMIDE 100 MG/10ML IV SOLN
INTRAVENOUS | Status: DC | PRN
  Administered 2017-03-13: 10 mg via INTRAVENOUS
  Administered 2017-03-13: 30 mg via INTRAVENOUS
  Administered 2017-03-13: 50 mg via INTRAVENOUS

## 2017-03-13 MED ORDER — LIDOCAINE HCL (CARDIAC) 20 MG/ML IV SOLN
INTRAVENOUS | Status: DC | PRN
  Administered 2017-03-13: 100 mg via INTRAVENOUS

## 2017-03-13 MED ORDER — EPHEDRINE SULFATE 50 MG/ML IJ SOLN
INTRAMUSCULAR | Status: DC | PRN
  Administered 2017-03-13: 10 mg via INTRAVENOUS

## 2017-03-13 MED ORDER — IOPAMIDOL (ISOVUE-300) INJECTION 61%
INTRAVENOUS | Status: AC
  Filled 2017-03-13: qty 150

## 2017-03-13 MED ORDER — CLOPIDOGREL BISULFATE 75 MG PO TABS
75.0000 mg | ORAL_TABLET | Freq: Every day | ORAL | Status: DC
  Administered 2017-03-14: 75 mg via ORAL
  Filled 2017-03-13: qty 1

## 2017-03-13 MED ORDER — PROCHLORPERAZINE MALEATE 10 MG PO TABS
10.0000 mg | ORAL_TABLET | Freq: Four times a day (QID) | ORAL | Status: DC | PRN
  Filled 2017-03-13: qty 1

## 2017-03-13 MED ORDER — SODIUM CHLORIDE 0.9 % IV SOLN
INTRAVENOUS | Status: DC | PRN
  Administered 2017-03-13 (×2): via INTRAVENOUS

## 2017-03-13 MED ORDER — CITALOPRAM HYDROBROMIDE 10 MG PO TABS
20.0000 mg | ORAL_TABLET | Freq: Every day | ORAL | Status: DC
  Administered 2017-03-14: 20 mg via ORAL
  Filled 2017-03-13: qty 2

## 2017-03-13 MED ORDER — ONDANSETRON HCL 4 MG/2ML IJ SOLN: 4.0000 mg | INTRAMUSCULAR | Status: DC | PRN

## 2017-03-13 MED ORDER — SENNA 8.6 MG PO TABS
1.0000 | ORAL_TABLET | Freq: Two times a day (BID) | ORAL | Status: DC
  Administered 2017-03-13 – 2017-03-14 (×2): 8.6 mg via ORAL
  Filled 2017-03-13 (×2): qty 1

## 2017-03-13 MED ORDER — HEPARIN SODIUM (PORCINE) 1000 UNIT/ML IJ SOLN
INTRAMUSCULAR | Status: DC | PRN
  Administered 2017-03-13: 1000 [IU] via INTRAVENOUS
  Administered 2017-03-13: 5000 [IU] via INTRAVENOUS

## 2017-03-13 MED ORDER — ONDANSETRON HCL 4 MG/2ML IJ SOLN: 4.0000 mg | Freq: Once | INTRAMUSCULAR | Status: DC | PRN

## 2017-03-13 MED ORDER — DOCUSATE SODIUM 100 MG PO CAPS
100.0000 mg | ORAL_CAPSULE | Freq: Two times a day (BID) | ORAL | Status: DC
  Administered 2017-03-13 – 2017-03-14 (×2): 100 mg via ORAL
  Filled 2017-03-13 (×2): qty 1

## 2017-03-13 NOTE — Anesthesia Procedure Notes (Signed)
Procedure Name: Intubation Date/Time: 03/13/2017 11:13 AM Performed by: Rejeana Brock L Pre-anesthesia Checklist: Patient identified, Emergency Drugs available, Suction available and Patient being monitored Patient Re-evaluated:Patient Re-evaluated prior to induction Oxygen Delivery Method: Circle System Utilized Preoxygenation: Pre-oxygenation with 100% oxygen Induction Type: IV induction Ventilation: Mask ventilation without difficulty Laryngoscope Size: Mac and 3 Grade View: Grade I Tube type: Subglottic suction tube Tube size: 7.5 mm Number of attempts: 1 Airway Equipment and Method: Stylet and Oral airway Placement Confirmation: ETT inserted through vocal cords under direct vision,  positive ETCO2 and breath sounds checked- equal and bilateral Secured at: 22 cm Tube secured with: Tape Dental Injury: Teeth and Oropharynx as per pre-operative assessment

## 2017-03-13 NOTE — H&P (Signed)
Chief Complaint  Aneurysm  History of Present Illness  Diane Short is a 61 y.o. female with metastatic breast CA and a history of right MCA aneurysm surgery decades ago who was found to have large aneurysm residual/recurrence. She was also found to have multiple other aneurysms including left ICA, small right ICA and Acom, and a PICA aneurysm. I have spoken with her oncologist in [place] who indicates that her life expectancy is on the order of 5-10 yrs. After lengthy discussion with the patient and her family, they elected to proceed with treatment.  Past Medical History   Past Medical History:  Diagnosis Date  . 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 Anxiety   . Arthritis   . Cancer [facility])    breast cancer  . Cerebral hemorrhage ([place]) 1986  . GERD (gastroesophageal reflux disease)   . History of hiatal hernia   . Hypertension     Past Surgical History   Past Surgical History:  Procedure Laterality Date  . BREAST SURGERY Left 11/2016   metastic to bone  . CATARACT EXTRACTION Right 03/02/2017  . CEREBRAL ANGIOGRAM  1986  . CESAREAN SECTION  1980  . IR 3D INDEPENDENT WKST  01/20/2017  . IR ANGIO INTRA EXTRACRAN SEL INTERNAL CAROTID BILAT MOD SED  01/20/2017  . IR ANGIO VERTEBRAL SEL VERTEBRAL BILAT MOD SED  01/20/2017  . port a cathh  04/2016  . TUBAL LIGATION  1983    Social History   Social History  Substance Use Topics  . Smoking status: Current Every Day Smoker    Packs/day: 0.50    Years: 42.00    Types: Cigarettes  . Smokeless tobacco: Never Used  . Alcohol use Yes     Comment: light social    Medications   Prior to Admission medications   Medication Sig Start Date End Date Taking? Authorizing Provider  acetaminophen (TYLENOL) 500 MG tablet Take 1,000 mg by mouth 2 (two) times daily as needed for mild pain.    Yes [provider]  aspirin 325 MG tablet Take 325 mg by mouth daily.   Yes [provider]  Calcium Citrate-Vitamin D (CALCIUM CITRATE +  D3 PO) Take 1-2 tablets by mouth 2 (two) times daily. Takes 2 in the am and 1 in the evening   Yes [provider]  carvedilol (COREG) 25 MG tablet Take 25 mg by mouth 2 (two) times daily with a meal.   Yes [provider]  citalopram (CELEXA) 20 MG tablet Take 20 mg by mouth daily.   Yes [provider]  clopidogrel (PLAVIX) 75 MG tablet Take 75 mg by mouth daily.   Yes [provider]  dexamethasone (DECADRON) 4 MG tablet Take 12 mg by mouth See admin instructions. Take 3 tabs the night before chemo and 3 tabs the morning of chemo   Yes [provider]  diphenhydrAMINE (BENADRYL) 25 MG tablet Take 25 mg by mouth at bedtime as needed for sleep.   Yes [provider]  loperamide (IMODIUM A-D) 2 MG tablet Take 2-4 mg by mouth daily as needed for diarrhea or loose stools.   Yes [provider]  LORazepam (ATIVAN) 0.5 MG tablet Take 0.5 mg by mouth every 4 (four) hours as needed for anxiety.   Yes [provider]  omeprazole (PRILOSEC) 20 MG capsule Take 20 mg by mouth daily.   Yes [provider]  ondansetron (ZOFRAN) 4 MG tablet Take 4 mg by  mouth every 4 (four) hours as needed for nausea or vomiting.   Yes [provider]  Prednisolon-Gatiflox-Bromfenac 1-0.5-0.075 % SUSP Place 1 drop into the right eye See admin instructions. Starting aug 19 1 drops in right eye 3 times daily for 1 week 1 drop in right eye 2 times daily for 1 week 1 drop in right eye daily for 14 days   Yes [provider]  PACLitaxel (TAXOL IV) Inject into the vein. Takes once a week for 3 weeks then off for a week    [provider]  prochlorperazine (COMPAZINE) 10 MG tablet Take 10 mg by mouth every 6 (six) hours as needed for nausea or vomiting.    [provider]    Allergies   Allergies  Allergen Reactions  . Nsaids Other (See Comments)    GI upset   . Ace Inhibitors Other (See Comments)    cough     Review of Systems  ROS  Neurologic Exam  Awake, alert, oriented Memory and concentration grossly intact Speech fluent, appropriate CN grossly intact Motor exam: Upper Extremities Deltoid Bicep Tricep Grip  Right 5/5 5/5 5/5 5/5  Left 5/5 5/5 5/5 5/5   Lower Extremities IP Quad PF DF EHL  Right 5/5 5/5 5/5 5/5 5/5  Left 5/5 5/5 5/5 5/5 5/5   Sensation grossly intact to LT  Imaging  Large right MCA wide-necked aneurysm incorporating the MCA divisions. There are also smaller right Pcom, ACA, and larger left ophthalmic and PICA aneurysms.  Impression  - 61 y.o. female with multiple aneurysms, history of subarachnoid hemorrhage, and metastatic breast CA.  Plan  - Proceed with stent-supported coiling of right MCA aneurysm  I have reviewed extensively the risks and benefits as well as alternatives with the patient and family. All questions were answered and consent was obtained.

## 2017-03-13 NOTE — Transfer of Care (Signed)
Immediate Anesthesia Transfer of Care Note  Patient: Diane Short  Procedure(s) Performed: Procedure(s): stent coiling of right MCA aneurysm (N/A)  Patient Location: PACU  Anesthesia Type:General  Level of Consciousness: awake and alert   Airway & Oxygen Therapy: Patient Spontanous Breathing and Patient connected to nasal cannula oxygen  Post-op Assessment: Report given to RN, Post -op Vital signs reviewed and stable and Patient moving all extremities X 4  Post vital signs: Reviewed and stable  Last Vitals: There were no vitals filed for this visit.  Last Pain: There were no vitals filed for this visit.       Complications: No apparent anesthesia complications

## 2017-03-13 NOTE — Anesthesia Postprocedure Evaluation (Signed)
Anesthesia Post Note  Patient: Diane Short  Procedure(s) Performed: Procedure(s) (LRB): stent coiling of right MCA aneurysm (N/A)     Patient location during evaluation: PACU Anesthesia Type: General Level of consciousness: awake and alert Pain management: pain level controlled Vital Signs Assessment: post-procedure vital signs reviewed and stable Respiratory status: spontaneous breathing, nonlabored ventilation, respiratory function stable and patient connected to nasal cannula oxygen Cardiovascular status: blood pressure returned to baseline and stable Postop Assessment: no signs of nausea or vomiting Anesthetic complications: no    Last Vitals:  Vitals:   03/13/17 1517 03/13/17 1530  BP: 136/78   Pulse: 84 84  Resp: 11 14  Temp:  36.7 C  SpO2: 99% 99%    Last Pain: There were no vitals filed for this visit.               [name]

## 2017-03-13 NOTE — Progress Notes (Addendum)
[facility] Progress Note Patient Name: Diane Short DOB: Oct 02, 1955 MRN: [id number]   Date of Service  03/13/2017  HPI/Events of Note  Patient admitted for coiling of aneurysm with known metastatic breast cancer. Status post right MCA aneurysm coiling. Brief camera check is patient resting comfortably with family at bedside. Currently on nasal cannula. Normal work of breathing.   eICU Interventions  Continuing plan of care as per primary service.      Intervention Category Evaluation Type: New Patient Evaluation  Tera Partridge 03/13/2017, 4:14 PM

## 2017-03-13 NOTE — Anesthesia Preprocedure Evaluation (Signed)
Anesthesia Evaluation  Patient identified by MRN, date of birth, ID band Patient awake    Reviewed: Allergy & Precautions, NPO status , Patient's Chart, lab work & pertinent test results  Airway Mallampati: I  TM Distance: >3 FB Neck ROM: Full    Dental   Pulmonary Current Smoker,    Pulmonary exam normal        Cardiovascular hypertension, Pt. on medications Normal cardiovascular exam     Neuro/Psych Anxiety    GI/Hepatic GERD  Medicated and Controlled,  Endo/Other    Renal/GU      Musculoskeletal   Abdominal   Peds  Hematology   Anesthesia Other Findings   Reproductive/Obstetrics                             Anesthesia Physical Anesthesia Plan  ASA: III  Anesthesia Plan: General   Post-op Pain Management:    Induction: Intravenous  PONV Risk Score and Plan: 2 and Ondansetron, Dexamethasone and Treatment may vary due to age or medical condition  Airway Management Planned: Oral ETT  Additional Equipment: Arterial line  Intra-op Plan:   Post-operative Plan: Post-operative intubation/ventilation  Informed Consent:   Plan Discussed with: CRNA and Surgeon  Anesthesia Plan Comments:         Anesthesia Quick Evaluation

## 2017-03-13 NOTE — Sedation Documentation (Signed)
7 Fr. Exoseal to right groin 

## 2017-03-13 NOTE — Anesthesia Procedure Notes (Signed)
Arterial Line Insertion Start/End8/31/2018 10:00 AM, 03/13/2017 10:04 AM Performed by: Lelon Perla A, CRNA  Patient location: Pre-op. Preanesthetic checklist: patient identified, IV checked, site marked, risks and benefits discussed, surgical consent, monitors and equipment checked, pre-op evaluation, timeout performed and anesthesia consent Lidocaine 1% used for infiltration and patient sedated Right, radial was placed Catheter size: 20 G Hand hygiene performed , maximum sterile barriers used  and Seldinger technique used Allen's test indicative of satisfactory collateral circulation Attempts: 1 Procedure performed without using ultrasound guided technique. Following insertion, dressing applied. Post procedure assessment: normal and unchanged  Patient tolerated the procedure well with no immediate complications.

## 2017-03-14 NOTE — Progress Notes (Signed)
patient seen and examined postoperatively. Awake, alert, conversant. Following commands, good strength in all extremities.  - observe in ICU overnight. - if stable, plan on d\c home in am with continuation of Asa 325mg  and plavix 75mg  daily

## 2017-03-14 NOTE — Care Management Note (Signed)
Case Management Note  Patient Details  Name: Diane Short MRN: [id number] Date of Birth: Apr 22, 1956  Subjective/Objective:                 Patient with order to DC to home today. Chart reviewed. No Home Health or Equipment needs, no unacknowledged Case Management consults or medication needs identified at the time of this note. Plan for DC to home. If needs arise today prior to discharge, please call Carles Collet RN CM at [phone].    Action/Plan:   Expected Discharge Date:  03/14/17               Expected Discharge Plan:  Home/Self Care  In-House Referral:     Discharge planning Services  CM Consult  Post Acute Care Choice:    Choice offered to:     DME Arranged:    DME Agency:     HH Arranged:    HH Agency:     Status of Service:  Completed, signed off  If discussed at [employer] of Stay Meetings, dates discussed:    Additional Comments:  Carles Collet, RN 03/14/2017, 10:22 AM

## 2017-03-14 NOTE — Discharge Summary (Signed)
Physician Discharge Summary  Patient ID: Diane Short MRN: [id number] DOB/AGE: 11-24-1955 61 y.o.  Admit date: 03/13/2017 Discharge date: 03/14/2017  Admission Diagnoses:Cerebral aneurysm  Discharge Diagnoses: The same Active Problems:   Cerebral aneurysm, nonruptured   Discharged Condition: good  Hospital Course: Dr. Kathyrn Sheriff performed a coiling of the patient's cerebral aneurysm on 03/13/2017.  The patient's postoperative course was unremarkable. On postoperative day #1 the patient requested discharge to home. The patient, and her husband, were given oral and written discharge instructions. All her questions were answered.  Consults: None Significant Diagnostic Studies: Cerebral arteriogram Treatments: Coiling of intracranial aneurysm Discharge Exam: Blood pressure (!) 141/76, pulse 87, temperature 98.1 F (36.7 C), temperature source Oral, resp. rate 18, last menstrual period 08/14/2010, SpO2 99 %. The patient is alert and pleasant. Her speech and strength is normal.  Disposition: Home  Discharge Instructions    Call MD for:  difficulty breathing, headache or visual disturbances    Complete by:  As directed    Call MD for:  extreme fatigue    Complete by:  As directed    Call MD for:  hives    Complete by:  As directed    Call MD for:  persistant dizziness or light-headedness    Complete by:  As directed    Call MD for:  persistant nausea and vomiting    Complete by:  As directed    Call MD for:  redness, tenderness, or signs of infection (pain, swelling, redness, odor or green/yellow discharge around incision site)    Complete by:  As directed    Call MD for:  severe uncontrolled pain    Complete by:  As directed    Call MD for:  temperature >100.4    Complete by:  As directed    Diet - low sodium heart healthy    Complete by:  As directed    Discharge instructions    Complete by:  As directed    Call [phone] for a followup appointment. Take a stool  softener while you are using pain medications.   Driving Restrictions    Complete by:  As directed    Do not drive for 2 weeks.   Increase activity slowly    Complete by:  As directed    Lifting restrictions    Complete by:  As directed    Do not lift more than 5 pounds. No excessive bending or twisting.   May shower / Bathe    Complete by:  As directed    He may shower after the pain she is removed 3 days after surgery. Leave the incision alone.   Remove dressing in 24 hours    Complete by:  As directed      Allergies as of 03/14/2017      Reactions   Nsaids Other (See Comments)   GI upset    Ace Inhibitors Other (See Comments)   cough      Medication List    STOP taking these medications   dexamethasone 4 MG tablet Commonly known as:  DECADRON     TAKE these medications   acetaminophen 500 MG tablet Commonly known as:  TYLENOL Take 1,000 mg by mouth 2 (two) times daily as needed for mild pain.   aspirin 325 MG tablet Take 325 mg by mouth daily.   CALCIUM CITRATE + D3 PO Take 1-2 tablets by mouth 2 (two) times daily. Takes 2 in the am and 1 in the evening  carvedilol 25 MG tablet Commonly known as:  COREG Take 25 mg by mouth 2 (two) times daily with a meal.   citalopram 20 MG tablet Commonly known as:  CELEXA Take 20 mg by mouth daily.   clopidogrel 75 MG tablet Commonly known as:  PLAVIX Take 75 mg by mouth daily.   diphenhydrAMINE 25 MG tablet Commonly known as:  BENADRYL Take 25 mg by mouth at bedtime as needed for sleep.   loperamide 2 MG tablet Commonly known as:  IMODIUM A-D Take 2-4 mg by mouth daily as needed for diarrhea or loose stools.   LORazepam 0.5 MG tablet Commonly known as:  ATIVAN Take 0.5 mg by mouth every 4 (four) hours as needed for anxiety.   omeprazole 20 MG capsule Commonly known as:  PRILOSEC Take 20 mg by mouth daily.   ondansetron 4 MG tablet Commonly known as:  ZOFRAN Take 4 mg by mouth every 4 (four) hours as needed  for nausea or vomiting.   Prednisolon-Gatiflox-Bromfenac 1-0.5-0.075 % Susp Place 1 drop into the right eye See admin instructions. Starting aug 19 1 drops in right eye 3 times daily for 1 week 1 drop in right eye 2 times daily for 1 week 1 drop in right eye daily for 14 days   prochlorperazine 10 MG tablet Commonly known as:  COMPAZINE Take 10 mg by mouth every 6 (six) hours as needed for nausea or vomiting.   TAXOL IV Inject into the vein. Takes once a week for 3 weeks then off for a week            Discharge Care Instructions        Start     Ordered   03/14/17 0000  Discharge instructions    Comments:  Call [phone] for a followup appointment. Take a stool softener while you are using pain medications.   03/14/17 1004   03/14/17 0000  Increase activity slowly     03/14/17 1004   03/14/17 0000  May shower / Bathe    Comments:  He may shower after the pain she is removed 3 days after surgery. Leave the incision alone.   03/14/17 1004   03/14/17 0000  Driving Restrictions    Comments:  Do not drive for 2 weeks.   03/14/17 1004   03/14/17 0000  Lifting restrictions    Comments:  Do not lift more than 5 pounds. No excessive bending or twisting.   03/14/17 1004   03/14/17 0000  Diet - low sodium heart healthy     03/14/17 1004   03/14/17 0000  Call MD for:  temperature >100.4     03/14/17 1004   03/14/17 0000  Call MD for:  persistant nausea and vomiting     03/14/17 1004   03/14/17 0000  Call MD for:  severe uncontrolled pain     03/14/17 1004   03/14/17 0000  Call MD for:  redness, tenderness, or signs of infection (pain, swelling, redness, odor or green/yellow discharge around incision site)     03/14/17 1004   03/14/17 0000  Call MD for:  difficulty breathing, headache or visual disturbances     03/14/17 1004   03/14/17 0000  Call MD for:  hives     03/14/17 1004   03/14/17 0000  Call MD for:  persistant dizziness or light-headedness     03/14/17 1004    03/14/17 0000  Call MD for:  extreme fatigue     03/14/17 1004  03/14/17 0000  Remove dressing in 24 hours     03/14/17 1004       Signed: Jalayne Ganesh D 03/14/2017, 10:04 AM

## 2017-03-14 NOTE — Progress Notes (Signed)
Patient and husband given and reviewed all discharge instructions including MD number to call for follow up appt, puncture site care, restrictions, when to call MD/911, information on aneurysms, stroke prevention including s/s, and angiogram and medications. Patient provided with resources to quit smoking. States she quit for 6 months but then starting smoking again and knows that she needs to quit. All questions answered. Neurologically stable, femoral site level 0 and dp pulse 2+ brisk cap refill. Patient d/c home with belongings via wheelchair/NT escort.

## 2017-03-16 ENCOUNTER — Encounter (HOSPITAL_COMMUNITY): Payer: Self-pay | Admitting: Neurosurgery

## 2017-03-24 ENCOUNTER — Other Ambulatory Visit: Payer: Self-pay | Admitting: Neurosurgery

## 2017-03-25 ENCOUNTER — Other Ambulatory Visit (HOSPITAL_COMMUNITY): Payer: Self-pay | Admitting: Neurosurgery

## 2017-03-25 DIAGNOSIS — I671 Cerebral aneurysm, nonruptured: Secondary | ICD-10-CM

## 2017-04-08 ENCOUNTER — Encounter (HOSPITAL_COMMUNITY): Payer: Self-pay | Admitting: *Deleted

## 2017-04-08 NOTE — Progress Notes (Signed)
Pt denies SOB, chest pain, and being under the care of a cardiologist. Pt stated that a stress test was performed > 10 years ago. Pt stated that an echo and chest x ray was done at [facility]; records requested. Pt denies having a cardiac cath. Pt denies recent labs. Pt made aware to take Aspirin and Plavix morning of procedure ( per [facility], Surgical Coordinator). Pt made aware to stop taking vitamins, fish oil and herbal medications. Pt verbalized understanding of all pre-op instructions.

## 2017-04-09 ENCOUNTER — Encounter (HOSPITAL_COMMUNITY)
Admission: RE | Disposition: A | Discharge status: Home / Self Care | Payer: Self-pay | Source: Ambulatory Visit | Attending: Neurosurgery

## 2017-04-09 ENCOUNTER — Inpatient Hospital Stay (HOSPITAL_COMMUNITY)
Admission: RE | Admit: 2017-04-09 | Discharge: 2017-04-10 | DRG: 026 | Disposition: A | Discharge status: Home / Self Care | Payer: Medicaid Other | Source: Ambulatory Visit | Attending: Neurosurgery | Admitting: Neurosurgery

## 2017-04-09 ENCOUNTER — Inpatient Hospital Stay (HOSPITAL_COMMUNITY): Payer: Medicaid Other | Admitting: Certified Registered"

## 2017-04-09 ENCOUNTER — Ambulatory Visit (HOSPITAL_COMMUNITY)
Admission: RE | Admit: 2017-04-09 | Discharge: 2017-04-09 | Disposition: A | Discharge status: Home / Self Care | Payer: Medicaid Other | Source: Ambulatory Visit | Attending: Neurosurgery | Admitting: Neurosurgery

## 2017-04-09 ENCOUNTER — Encounter (HOSPITAL_COMMUNITY): Payer: Self-pay | Admitting: *Deleted

## 2017-04-09 DIAGNOSIS — I10 Essential (primary) hypertension: Secondary | ICD-10-CM | POA: Diagnosis present

## 2017-04-09 DIAGNOSIS — K449 Diaphragmatic hernia without obstruction or gangrene: Secondary | ICD-10-CM | POA: Diagnosis present

## 2017-04-09 DIAGNOSIS — K219 Gastro-esophageal reflux disease without esophagitis: Secondary | ICD-10-CM | POA: Diagnosis present

## 2017-04-09 DIAGNOSIS — F17210 Nicotine dependence, cigarettes, uncomplicated: Secondary | ICD-10-CM | POA: Diagnosis present

## 2017-04-09 DIAGNOSIS — Z7902 Long term (current) use of antithrombotics/antiplatelets: Secondary | ICD-10-CM

## 2017-04-09 DIAGNOSIS — M1990 Unspecified osteoarthritis, unspecified site: Secondary | ICD-10-CM | POA: Diagnosis present

## 2017-04-09 DIAGNOSIS — F419 Anxiety disorder, unspecified: Secondary | ICD-10-CM | POA: Diagnosis present

## 2017-04-09 DIAGNOSIS — Z7982 Long term (current) use of aspirin: Secondary | ICD-10-CM | POA: Diagnosis not present

## 2017-04-09 DIAGNOSIS — Z9841 Cataract extraction status, right eye: Secondary | ICD-10-CM

## 2017-04-09 DIAGNOSIS — C7951 Secondary malignant neoplasm of bone: Secondary | ICD-10-CM | POA: Diagnosis present

## 2017-04-09 DIAGNOSIS — Z886 Allergy status to analgesic agent status: Secondary | ICD-10-CM | POA: Diagnosis not present

## 2017-04-09 DIAGNOSIS — Z888 Allergy status to other drugs, medicaments and biological substances status: Secondary | ICD-10-CM

## 2017-04-09 DIAGNOSIS — I671 Cerebral aneurysm, nonruptured: Secondary | ICD-10-CM | POA: Diagnosis present

## 2017-04-09 DIAGNOSIS — Z853 Personal history of malignant neoplasm of breast: Secondary | ICD-10-CM | POA: Diagnosis not present

## 2017-04-09 DIAGNOSIS — I739 Peripheral vascular disease, unspecified: Secondary | ICD-10-CM | POA: Diagnosis present

## 2017-04-09 HISTORY — PX: IR ANGIO INTRA EXTRACRAN SEL COM CAROTID INNOMINATE UNI R MOD SED: IMG5359

## 2017-04-09 HISTORY — PX: IR US GUIDE VASC ACCESS RIGHT: IMG2390

## 2017-04-09 HISTORY — PX: IR ANGIOGRAM FOLLOW UP STUDY: IMG697

## 2017-04-09 HISTORY — DX: Headache: R51

## 2017-04-09 HISTORY — PX: IR NEURO EACH ADD'L AFTER BASIC UNI LEFT (MS): IMG5373

## 2017-04-09 HISTORY — PX: RADIOLOGY WITH ANESTHESIA: SHX6223

## 2017-04-09 HISTORY — PX: IR TRANSCATH/EMBOLIZ: IMG695

## 2017-04-09 HISTORY — DX: Headache, unspecified: R51.9

## 2017-04-09 LAB — CBC WITH DIFFERENTIAL/PLATELET
Basophils Absolute: 0 10*3/uL (ref 0.0–0.1)
Basophils Relative: 0 %
Eosinophils Absolute: 0.2 10*3/uL (ref 0.0–0.7)
Eosinophils Relative: 3 %
HEMATOCRIT: 36.7 % (ref 36.0–46.0)
HEMOGLOBIN: 12.1 g/dL (ref 12.0–15.0)
LYMPHS ABS: 1.6 10*3/uL (ref 0.7–4.0)
Lymphocytes Relative: 28 %
MCH: 30.5 pg (ref 26.0–34.0)
MCHC: 33 g/dL (ref 30.0–36.0)
MCV: 92.4 fL (ref 78.0–100.0)
MONO ABS: 0.4 10*3/uL (ref 0.1–1.0)
MONOS PCT: 6 %
NEUTROS ABS: 3.5 10*3/uL (ref 1.7–7.7)
Neutrophils Relative %: 63 %
Platelets: 261 10*3/uL (ref 150–400)
RBC: 3.97 MIL/uL (ref 3.87–5.11)
RDW: 15 % (ref 11.5–15.5)
WBC: 5.6 10*3/uL (ref 4.0–10.5)

## 2017-04-09 LAB — SURGICAL PCR SCREEN
MRSA, PCR: NEGATIVE
Staphylococcus aureus: NEGATIVE

## 2017-04-09 LAB — BASIC METABOLIC PANEL
ANION GAP: 10 (ref 5–15)
BUN: 9 mg/dL (ref 6–20)
CHLORIDE: 103 mmol/L (ref 101–111)
CO2: 26 mmol/L (ref 22–32)
CREATININE: 0.8 mg/dL (ref 0.44–1.00)
Calcium: 9.3 mg/dL (ref 8.9–10.3)
GFR calc non Af Amer: >60 mL/min (ref >60)
Glucose, Bld: 96 mg/dL (ref 65–99)
Potassium: 4.4 mmol/L (ref 3.5–5.1)
SODIUM: 139 mmol/L (ref 135–145)

## 2017-04-09 LAB — URINALYSIS, ROUTINE W REFLEX MICROSCOPIC
Bilirubin Urine: NEGATIVE
GLUCOSE, UA: NEGATIVE mg/dL
HGB URINE DIPSTICK: NEGATIVE
Ketones, ur: NEGATIVE mg/dL
LEUKOCYTES UA: NEGATIVE
Nitrite: NEGATIVE
PH: 7 (ref 5.0–8.0)
PROTEIN: NEGATIVE mg/dL
SPECIFIC GRAVITY, URINE: 1.014 (ref 1.005–1.030)

## 2017-04-09 LAB — APTT: aPTT: 27 seconds (ref 24–36)

## 2017-04-09 LAB — PROTIME-INR
INR: 0.99
Prothrombin Time: 13 seconds (ref 11.4–15.2)

## 2017-04-09 SURGERY — RADIOLOGY WITH ANESTHESIA
Anesthesia: General

## 2017-04-09 MED ORDER — CALCIUM CARBONATE-VITAMIN D 500-200 MG-UNIT PO TABS
ORAL_TABLET | Freq: Two times a day (BID) | ORAL | Status: DC
  Administered 2017-04-09 – 2017-04-10 (×2): 1 via ORAL
  Filled 2017-04-09 (×2): qty 1

## 2017-04-09 MED ORDER — NICARDIPINE HCL IN NACL 20-0.86 MG/200ML-% IV SOLN
3.0000 mg/h | INTRAVENOUS | Status: DC
  Administered 2017-04-09: 7.5 mg/h via INTRAVENOUS
  Administered 2017-04-09: 10 mg/h via INTRAVENOUS
  Filled 2017-04-09 (×2): qty 200

## 2017-04-09 MED ORDER — DEXAMETHASONE SODIUM PHOSPHATE 10 MG/ML IJ SOLN
INTRAMUSCULAR | Status: DC | PRN
  Administered 2017-04-09: 4 mg via INTRAVENOUS

## 2017-04-09 MED ORDER — SODIUM CHLORIDE 0.9 % IV SOLN: INTRAVENOUS | Status: DC

## 2017-04-09 MED ORDER — CITALOPRAM HYDROBROMIDE 10 MG PO TABS
20.0000 mg | ORAL_TABLET | Freq: Every day | ORAL | Status: DC
  Administered 2017-04-10: 20 mg via ORAL
  Filled 2017-04-09: qty 2

## 2017-04-09 MED ORDER — PANTOPRAZOLE SODIUM 40 MG PO TBEC
40.0000 mg | DELAYED_RELEASE_TABLET | Freq: Every day | ORAL | Status: DC
  Administered 2017-04-10: 40 mg via ORAL
  Filled 2017-04-09: qty 1

## 2017-04-09 MED ORDER — ONDANSETRON HCL 4 MG/2ML IJ SOLN
INTRAMUSCULAR | Status: DC | PRN
  Administered 2017-04-09: 4 mg via INTRAVENOUS

## 2017-04-09 MED ORDER — SODIUM CHLORIDE 0.9 % IV SOLN
INTRAVENOUS | Status: DC | PRN
  Administered 2017-04-09 (×2): via INTRAVENOUS

## 2017-04-09 MED ORDER — HYDROCODONE-ACETAMINOPHEN 5-325 MG PO TABS: 1.0000 | ORAL_TABLET | ORAL | Status: DC | PRN

## 2017-04-09 MED ORDER — ESMOLOL HCL 100 MG/10ML IV SOLN
INTRAVENOUS | Status: DC | PRN
  Administered 2017-04-09: 20 mg via INTRAVENOUS
  Administered 2017-04-09: 10 mg via INTRAVENOUS

## 2017-04-09 MED ORDER — LIDOCAINE HCL (CARDIAC) 20 MG/ML IV SOLN
INTRAVENOUS | Status: DC | PRN
  Administered 2017-04-09: 100 mg via INTRAVENOUS

## 2017-04-09 MED ORDER — LORAZEPAM 1 MG PO TABS: 0.5000 mg | ORAL_TABLET | ORAL | Status: DC | PRN

## 2017-04-09 MED ORDER — PROPOFOL 10 MG/ML IV BOLUS
INTRAVENOUS | Status: DC | PRN
  Administered 2017-04-09: 175 mg via INTRAVENOUS

## 2017-04-09 MED ORDER — PHENYLEPHRINE HCL 10 MG/ML IJ SOLN
INTRAMUSCULAR | Status: DC | PRN
  Administered 2017-04-09: 200 ug via INTRAVENOUS

## 2017-04-09 MED ORDER — ONDANSETRON HCL 4 MG PO TABS: 4.0000 mg | ORAL_TABLET | ORAL | Status: DC | PRN

## 2017-04-09 MED ORDER — ROCURONIUM BROMIDE 100 MG/10ML IV SOLN
INTRAVENOUS | Status: DC | PRN
  Administered 2017-04-09: 50 mg via INTRAVENOUS
  Administered 2017-04-09: 10 mg via INTRAVENOUS

## 2017-04-09 MED ORDER — LACTATED RINGERS IV SOLN
INTRAVENOUS | Status: DC
  Administered 2017-04-09: 12:00:00 via INTRAVENOUS

## 2017-04-09 MED ORDER — NICARDIPINE HCL IN NACL 20-0.86 MG/200ML-% IV SOLN
INTRAVENOUS | Status: DC | PRN
  Administered 2017-04-09: 5 mg/h via INTRAVENOUS

## 2017-04-09 MED ORDER — FENTANYL CITRATE (PF) 100 MCG/2ML IJ SOLN
INTRAMUSCULAR | Status: DC | PRN
  Administered 2017-04-09 (×4): 50 ug via INTRAVENOUS

## 2017-04-09 MED ORDER — DIPHENHYDRAMINE HCL 25 MG PO TABS
25.0000 mg | ORAL_TABLET | Freq: Every evening | ORAL | Status: DC | PRN
Start: 2017-04-09 — End: 2017-04-10
  Filled 2017-04-09: qty 1

## 2017-04-09 MED ORDER — PREDNISOLON-GATIFLOX-BROMFENAC 1-0.5-0.075 % OP SUSP: 1.0000 [drp] | OPHTHALMIC | Status: DC

## 2017-04-09 MED ORDER — PROCHLORPERAZINE MALEATE 10 MG PO TABS
10.0000 mg | ORAL_TABLET | Freq: Four times a day (QID) | ORAL | Status: DC | PRN
  Filled 2017-04-09: qty 1

## 2017-04-09 MED ORDER — FENTANYL CITRATE (PF) 100 MCG/2ML IJ SOLN: 25.0000 ug | INTRAMUSCULAR | Status: DC | PRN

## 2017-04-09 MED ORDER — DEXTROSE 5 % IV SOLN
INTRAVENOUS | Status: DC | PRN
  Administered 2017-04-09: 25 ug/min via INTRAVENOUS

## 2017-04-09 MED ORDER — CARVEDILOL 12.5 MG PO TABS
25.0000 mg | ORAL_TABLET | Freq: Two times a day (BID) | ORAL | Status: DC
  Administered 2017-04-09 – 2017-04-10 (×2): 25 mg via ORAL
  Filled 2017-04-09 (×2): qty 2

## 2017-04-09 MED ORDER — LABETALOL HCL 5 MG/ML IV SOLN
INTRAVENOUS | Status: DC | PRN
  Administered 2017-04-09 (×2): 10 mg via INTRAVENOUS

## 2017-04-09 MED ORDER — SUGAMMADEX SODIUM 200 MG/2ML IV SOLN
INTRAVENOUS | Status: DC | PRN
  Administered 2017-04-09: 200 mg via INTRAVENOUS

## 2017-04-09 MED ORDER — HEPARIN SODIUM (PORCINE) 1000 UNIT/ML IJ SOLN
INTRAMUSCULAR | Status: DC | PRN
  Administered 2017-04-09: 5000 [IU] via INTRAVENOUS

## 2017-04-09 MED ORDER — ASPIRIN 325 MG PO TABS
325.0000 mg | ORAL_TABLET | Freq: Every day | ORAL | Status: DC
  Administered 2017-04-10: 325 mg via ORAL
  Filled 2017-04-09 (×2): qty 1

## 2017-04-09 MED ORDER — LABETALOL HCL 5 MG/ML IV SOLN: 10.0000 mg | INTRAVENOUS | Status: DC | PRN

## 2017-04-09 MED ORDER — ACETAMINOPHEN 500 MG PO TABS: 1000.0000 mg | ORAL_TABLET | Freq: Two times a day (BID) | ORAL | Status: DC | PRN

## 2017-04-09 MED ORDER — SUCCINYLCHOLINE CHLORIDE 20 MG/ML IJ SOLN
INTRAMUSCULAR | Status: DC | PRN
  Administered 2017-04-09: 100 mg via INTRAVENOUS

## 2017-04-09 MED ORDER — MUPIROCIN 2 % EX OINT
TOPICAL_OINTMENT | CUTANEOUS | Status: AC
  Filled 2017-04-09: qty 22

## 2017-04-09 MED ORDER — CHLORHEXIDINE GLUCONATE CLOTH 2 % EX PADS
6.0000 | MEDICATED_PAD | Freq: Once | CUTANEOUS | Status: DC
  Administered 2017-04-09: 6 via TOPICAL

## 2017-04-09 MED ORDER — CLOPIDOGREL BISULFATE 75 MG PO TABS
75.0000 mg | ORAL_TABLET | Freq: Every day | ORAL | Status: DC
  Administered 2017-04-10: 75 mg via ORAL
  Filled 2017-04-09: qty 1

## 2017-04-09 MED ORDER — PANTOPRAZOLE SODIUM 40 MG IV SOLR: 40.0000 mg | Freq: Every day | INTRAVENOUS | Status: DC

## 2017-04-09 MED ORDER — LOPERAMIDE HCL 2 MG PO CAPS: 2.0000 mg | ORAL_CAPSULE | Freq: Every day | ORAL | Status: DC | PRN

## 2017-04-09 MED ORDER — FENTANYL CITRATE (PF) 100 MCG/2ML IJ SOLN
INTRAMUSCULAR | Status: AC
  Filled 2017-04-09: qty 4

## 2017-04-09 MED ORDER — ONDANSETRON HCL 4 MG/2ML IJ SOLN: 4.0000 mg | INTRAMUSCULAR | Status: DC | PRN

## 2017-04-09 MED ORDER — CHLORHEXIDINE GLUCONATE CLOTH 2 % EX PADS: 6.0000 | MEDICATED_PAD | Freq: Once | CUTANEOUS | Status: DC

## 2017-04-09 MED ORDER — CEFAZOLIN SODIUM-DEXTROSE 2-4 GM/100ML-% IV SOLN
2.0000 g | INTRAVENOUS | Status: AC
  Administered 2017-04-09: 2 g via INTRAVENOUS
  Filled 2017-04-09: qty 100

## 2017-04-09 MED ORDER — IOPAMIDOL (ISOVUE-300) INJECTION 61%
INTRAVENOUS | Status: AC
  Administered 2017-04-09: 55 mL
  Filled 2017-04-09: qty 150

## 2017-04-09 NOTE — Anesthesia Postprocedure Evaluation (Signed)
Anesthesia Post Note  Patient: Diane Short  Procedure(s) Performed: Procedure(s) (LRB): Arteriogram, pipeline embolization of aneruysm (N/A)     Patient location during evaluation: PACU Anesthesia Type: General Level of consciousness: awake Pain management: pain level controlled Vital Signs Assessment: post-procedure vital signs reviewed and stable Respiratory status: spontaneous breathing Cardiovascular status: stable Anesthetic complications: no    Last Vitals:  Vitals:   04/09/17 1136 04/09/17 1600  BP: (!) 160/72   Resp: 19   Temp: 36.7 C (!) (P) 36.4 C  SpO2: 100%     Last Pain:  Vitals:   04/09/17 1136  TempSrc: Oral                 Chanese Hartsough

## 2017-04-09 NOTE — Transfer of Care (Signed)
Immediate Anesthesia Transfer of Care Note  Patient: Diane Short  Procedure(s) Performed: Procedure(s): Arteriogram, pipeline embolization of aneruysm (N/A)  Patient Location: PACU  Anesthesia Type:General  Level of Consciousness: awake and alert   Airway & Oxygen Therapy: Patient Spontanous Breathing and Patient connected to nasal cannula oxygen  Post-op Assessment: Report given to RN and Post -op Vital signs reviewed and stable  Post vital signs: Reviewed and stable  Last Vitals:  Vitals:   04/09/17 1136  BP: (!) 160/72  Resp: 19  Temp: 36.7 C  SpO2: 100%    Last Pain:  Vitals:   04/09/17 1136  TempSrc: Oral         Complications: No apparent anesthesia complications

## 2017-04-09 NOTE — Progress Notes (Signed)
Pt seen in PACU. Awake, alert, oriented. Speech fluent. Moving all extremities with good strength. Will admit to ICU, goal SBP 110-176mmHg.

## 2017-04-09 NOTE — Brief Op Note (Signed)
04/09/2017  3:24 PM  PATIENT:  Diane Short  61 y.o. female  PRE-OPERATIVE DIAGNOSIS:  non ruptured cerebral aneurysm  I67.1  POST-OPERATIVE DIAGNOSIS:  Same  PROCEDURE:  Procedure(s): Arteriogram, pipeline embolization of aneruysm (N/A)  SURGEON:  Surgeon(s) and Role:    * Consuella Lose, MD - Primary  ANESTHESIA:   general  EBL:  Total I/O In: -  Out: 100 [Urine:100]  BLOOD ADMINISTERED:none  DRAINS: none   LOCAL MEDICATIONS USED:  NONE  DICTATION: .Other Dictation: Dictation Number See radiology report  PLAN OF CARE: Admit to inpatient   PATIENT DISPOSITION:  PACU - hemodynamically stable.

## 2017-04-09 NOTE — Anesthesia Procedure Notes (Signed)
Procedure Name: Intubation Date/Time: 04/09/2017 1:26 PM Performed by: Rejeana Brock L Pre-anesthesia Checklist: Patient identified, Emergency Drugs available, Suction available and Patient being monitored Patient Re-evaluated:Patient Re-evaluated prior to induction Oxygen Delivery Method: Circle System Utilized Preoxygenation: Pre-oxygenation with 100% oxygen Induction Type: IV induction Ventilation: Mask ventilation without difficulty Laryngoscope Size: Mac and 3 Grade View: Grade I Tube type: Subglottic suction tube Tube size: 7.5 mm Number of attempts: 1 Airway Equipment and Method: Stylet and Oral airway Placement Confirmation: ETT inserted through vocal cords under direct vision,  positive ETCO2 and breath sounds checked- equal and bilateral Secured at: 21 cm Tube secured with: Tape Dental Injury: Teeth and Oropharynx as per pre-operative assessment

## 2017-04-09 NOTE — H&P (Signed)
Chief Complaint  Aneurysms  History of Present Illness  Diane Short is a 61 y.o. female with a history of subarachnoid hemorrhage decades ago and multiple intracranial aneurysms. She also has a history of metastatic breast CA which appears to be stable in discussion with her oncologist. She underwent treatment of a large right MCA aneurysm a few weeks ago without complication and presents for treatment of a LICA aneurysm. She remains on ASA/Plavix without any significant bruising/spontaneous bleeding.  Past Medical History   Past Medical History:  Diagnosis Date  . 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 Anxiety   . Arthritis   . Cancer [facility])    breast cancer  . Cerebral hemorrhage ([place]) 1986  . GERD (gastroesophageal reflux disease)   . Headache   . History of hiatal hernia   . Hypertension     Past Surgical History   Past Surgical History:  Procedure Laterality Date  . BREAST SURGERY Left 11/2016   metastic to bone  . CATARACT EXTRACTION Right 03/02/2017  . CEREBRAL ANGIOGRAM  1986  . CESAREAN SECTION  1980  . IR 3D INDEPENDENT WKST  01/20/2017  . IR ANGIO INTRA EXTRACRAN SEL INTERNAL CAROTID BILAT MOD SED  01/20/2017  . IR ANGIO INTRA EXTRACRAN SEL INTERNAL CAROTID UNI R MOD SED  03/13/2017  . IR ANGIO VERTEBRAL SEL VERTEBRAL BILAT MOD SED  01/20/2017  . IR ANGIOGRAM FOLLOW UP STUDY  03/13/2017  . IR ANGIOGRAM FOLLOW UP STUDY  03/13/2017  . IR ANGIOGRAM FOLLOW UP STUDY  03/13/2017  . IR ANGIOGRAM FOLLOW UP STUDY  03/13/2017  . IR ANGIOGRAM FOLLOW UP STUDY  03/13/2017  . IR ANGIOGRAM FOLLOW UP STUDY  03/13/2017  . IR ANGIOGRAM FOLLOW UP STUDY  03/13/2017  . IR ANGIOGRAM FOLLOW UP STUDY  03/13/2017  . IR ANGIOGRAM FOLLOW UP STUDY  03/13/2017  . IR ANGIOGRAM FOLLOW UP STUDY  03/13/2017  . IR ANGIOGRAM FOLLOW UP STUDY  03/13/2017  . IR ANGIOGRAM FOLLOW UP STUDY  03/13/2017  . IR NEURO EACH ADD'L AFTER BASIC UNI RIGHT (MS)  03/13/2017  . IR TRANSCATH/EMBOLIZ  03/13/2017  . port a cathh   04/2016  . RADIOLOGY WITH ANESTHESIA N/A 03/13/2017   Procedure: stent coiling of right MCA aneurysm;  Surgeon: Consuella Lose, MD;  Location: [place];  Service: Radiology;  Laterality: N/A;  . TUBAL LIGATION  1983    Social History   Social History  Substance Use Topics  . Smoking status: Current Every Day Smoker    Packs/day: 0.50    Years: 42.00    Types: Cigarettes  . Smokeless tobacco: Never Used  . Alcohol use No    Medications   Prior to Admission medications   Medication Sig Start Date End Date Taking? Authorizing Provider  acetaminophen (TYLENOL) 500 MG tablet Take 1,000 mg by mouth 2 (two) times daily as needed for mild pain.    Yes [provider]  aspirin 325 MG tablet Take 325 mg by mouth daily.   Yes [provider]  Calcium Citrate-Vitamin D (CALCIUM CITRATE + D3 PO) Take 1-2 tablets by mouth 2 (two) times daily. Takes 2 in the am and 1 in the evening   Yes [provider]  carvedilol (COREG) 25 MG tablet Take 25 mg by mouth 2 (two) times daily with a meal.   Yes [provider]  citalopram (CELEXA) 20 MG tablet Take 20 mg by mouth daily.   Yes [provider]  clopidogrel (PLAVIX) 75 MG tablet Take 75 mg by mouth daily.   Yes [provider]  diphenhydrAMINE (BENADRYL) 25 MG tablet Take 25 mg by mouth at bedtime as needed for sleep.   Yes [provider]  LORazepam (ATIVAN) 0.5 MG tablet Take 0.5 mg by mouth every 4 (four) hours as needed for anxiety.   Yes [provider]  omeprazole (PRILOSEC) 20 MG capsule Take 20 mg by mouth daily.   Yes [provider]  Prednisolon-Gatiflox-Bromfenac 1-0.5-0.075 % SUSP Place 1 drop into the right eye See admin instructions. Starting aug 19 1 drops in right eye 3 times daily for 1 week 1 drop in right eye 2 times daily for 1 week 1 drop in right eye daily for 14 days   Yes [provider]  dexamethasone (DECADRON) 4 MG tablet Take 12 mg by  mouth See admin instructions. 12 mg the night before Chemo treatment, and 12 mg the morning of Chemo treatment    [provider]  loperamide (IMODIUM A-D) 2 MG tablet Take 2-4 mg by mouth daily as needed for diarrhea or loose stools.    [provider]  ondansetron (ZOFRAN) 4 MG tablet Take 4 mg by mouth every 4 (four) hours as needed for nausea or vomiting.    [provider]  PACLitaxel (TAXOL) 300 MG/50ML injection Inject into the vein See admin instructions. Inject into the vein. Takes once a week for 3 weeks then off for a week    [provider]  prochlorperazine (COMPAZINE) 10 MG tablet Take 10 mg by mouth every 6 (six) hours as needed for nausea or vomiting.    [provider]    Allergies   Allergies  Allergen Reactions  . Nsaids Other (See Comments)    GI upset   . Ace Inhibitors Other (See Comments)    cough    Review of Systems  ROS  Neurologic Exam  Awake, alert, oriented Memory and concentration grossly intact Speech fluent, appropriate CN grossly intact Motor exam: Upper Extremities Deltoid Bicep Tricep Grip  Right 5/5 5/5 5/5 5/5  Left 5/5 5/5 5/5 5/5   Lower Extremities IP Quad PF DF EHL  Right 5/5 5/5 5/5 5/5 5/5  Left 5/5 5/5 5/5 5/5 5/5   Sensation grossly intact to LT   Impression  - 61 y.o. female with multiple aneurysms, for treatment of LICA aneurysm today  Plan  - Proceed with pipeline embolization of LICA aneurysm  I have reviewed at length with the patient and her family the indications, risks, benefits, and alternatives. All questions were answered and consent was obtained.

## 2017-04-09 NOTE — Anesthesia Preprocedure Evaluation (Signed)
Anesthesia Evaluation  Patient identified by MRN, date of birth, ID band Patient awake    Reviewed: Allergy & Precautions, NPO status , Patient's Chart, lab work & pertinent test results  Airway Mallampati: II  TM Distance: >3 FB     Dental   Pulmonary Current Smoker,    breath sounds clear to auscultation       Cardiovascular hypertension, + Peripheral Vascular Disease   Rhythm:Regular Rate:Normal     Neuro/Psych  Headaches,    GI/Hepatic Neg liver ROS, hiatal hernia, GERD  ,  Endo/Other    Renal/GU negative Renal ROS     Musculoskeletal  (+) Arthritis ,   Abdominal   Peds  Hematology  (+) anemia ,   Anesthesia Other Findings   Reproductive/Obstetrics                             Anesthesia Physical Anesthesia Plan  ASA: III  Anesthesia Plan: General   Post-op Pain Management:    Induction: Intravenous  PONV Risk Score and Plan: 2 and Ondansetron, Dexamethasone and Treatment may vary due to age or medical condition  Airway Management Planned: Oral ETT  Additional Equipment:   Intra-op Plan:   Post-operative Plan: Extubation in OR  Informed Consent: I have reviewed the patients History and Physical, chart, labs and discussed the procedure including the risks, benefits and alternatives for the proposed anesthesia with the patient or authorized representative who has indicated his/her understanding and acceptance.   Dental advisory given  Plan Discussed with: CRNA and Anesthesiologist  Anesthesia Plan Comments:         Anesthesia Quick Evaluation

## 2017-04-09 NOTE — Anesthesia Procedure Notes (Signed)
Arterial Line Insertion Start/End9/27/2018 1:30 PM, 04/09/2017 1:40 PM Performed by: Inda Coke, CRNA  Patient location: OOR procedure area. Preanesthetic checklist: patient identified, IV checked, site marked, risks and benefits discussed, surgical consent, monitors and equipment checked, pre-op evaluation, timeout performed and anesthesia consent Patient sedated Right, radial was placed Catheter size: 20 G Hand hygiene performed  and maximum sterile barriers used  Allen's test indicative of satisfactory collateral circulation Attempts: 2 Procedure performed without using ultrasound guided technique. Ultrasound Notes:anatomy identified Following insertion, dressing applied. Post procedure assessment: normal  Patient tolerated the procedure well with no immediate complications.

## 2017-04-10 ENCOUNTER — Encounter (HOSPITAL_COMMUNITY): Payer: Self-pay | Admitting: Neurosurgery

## 2017-04-10 NOTE — Progress Notes (Signed)
Pt seen and examined. No issues overnight. Pt reports subjective word-finding difficulty, very mild. No weakness, visual changes. Voiding normally, able to get OOB.  EXAM: Temp:  [97.5 F (36.4 C)-98.6 F (37 C)] 98.4 F (36.9 C) (09/28 0400) Pulse Rate:  [69-96] 74 (09/28 0700) Resp:  [10-19] 13 (09/28 0700) BP: (95-160)/(55-86) 105/64 (09/28 0700) SpO2:  [94 %-100 %] 97 % (09/28 0700) Arterial Line BP: (113-145)/(46-66) 143/62 (09/28 0700) Weight:  [86.2 kg (190 lb)-90.6 kg (199 lb 11.8 oz)] 90.6 kg (199 lb 11.8 oz) (09/28 0545) Intake/Output      09/27 0701 - 09/28 0700 09/28 0701 - 09/29 0700   I.V. (mL/kg) 1200 (13.2)    Total Intake(mL/kg) 1200 (13.2)    Urine (mL/kg/hr) 2025    Blood 5    Total Output 2030     Net -830           Awake, alert, oriented Speech fluent CN intact Good strength throughout Right groin site c/d/i  LABS: Lab Results  Component Value Date   CREATININE 0.80 04/09/2017   BUN 9 04/09/2017   NA 139 04/09/2017   K 4.4 04/09/2017   CL 103 04/09/2017   CO2 26 04/09/2017   Lab Results  Component Value Date   WBC 5.6 04/09/2017   HGB 12.1 04/09/2017   HCT 36.7 04/09/2017   MCV 92.4 04/09/2017   PLT 261 04/09/2017    IMPRESSION: - 61 y.o. female POD#1 Pipeline embolization LICA aneurysm, doing well  PLAN: - D/C home today - Cont ASA / Plavix daily

## 2017-04-10 NOTE — Plan of Care (Signed)
Problem: Activity: Goal: Risk for activity intolerance will decrease Outcome: Completed/Met Date Met: 04/10/17 Ambulated in hall without complications  Problem: Nutrition: Goal: Adequate nutrition will be maintained Outcome: Completed/Met Date Met: 04/10/17 Started Carb Mod diet.  Good appetite

## 2017-04-10 NOTE — Discharge Summary (Signed)
Physician Discharge Summary  Patient ID: Diane Short MRN: [id number] DOB/AGE: [birth year] 59 y.o.  Admit date: 04/09/2017 Discharge date: 04/10/2017  Admission Diagnoses:  Cerebral aneurysm, unruptured  Discharge Diagnoses:  Same Active Problems:   Cerebral aneurysm, nonruptured   Discharged Condition: Stable  Hospital Course:  Diane Short is a 61 y.o. female with multiple intracranial aneurysms admitted after elective Pipeline embolization LICA aneurysm. She was at baseline postop, observed in ICU and discharged on POD#1. She was tolerating diet, voiding normally, ambulating well.  Treatments: Surgery - Pipeline embolization LICA aneurysm  Discharge Exam: Blood pressure 105/64, pulse 74, temperature 98.4 F (36.9 C), temperature source Oral, resp. rate 13, height 5\' 4"  (1.626 m), weight 90.6 kg (199 lb 11.8 oz), last menstrual period 08/14/2010, SpO2 97 %. Awake, alert, oriented Speech fluent, appropriate CN grossly intact 5/5 BUE/BLE Wound c/d/i  Disposition: 01-Home or Self Care  Discharge Instructions    Call MD for:  redness, tenderness, or signs of infection (pain, swelling, redness, odor or green/yellow discharge around incision site)    Complete by:  As directed    Call MD for:  temperature >100.4    Complete by:  As directed    Diet - low sodium heart healthy    Complete by:  As directed    Discharge instructions    Complete by:  As directed    Walk at home as much as possible, at least 4 times / day   Increase activity slowly    Complete by:  As directed    Lifting restrictions    Complete by:  As directed    No lifting > 10 lbs   May shower / Bathe    Complete by:  As directed    48 hours after surgery   May walk up steps    Complete by:  As directed    No dressing needed    Complete by:  As directed    Other Restrictions    Complete by:  As directed    No bending/twisting at waist     Allergies as of 04/10/2017      Reactions   Nsaids Other (See Comments)   GI upset    Ace Inhibitors Other (See Comments)   cough      Medication List    TAKE these medications   acetaminophen 500 MG tablet Commonly known as:  TYLENOL Take 1,000 mg by mouth 2 (two) times daily as needed for mild pain.   aspirin 325 MG tablet Take 325 mg by mouth daily.   CALCIUM CITRATE + D3 PO Take 1-2 tablets by mouth 2 (two) times daily. Takes 2 in the am and 1 in the evening   carvedilol 25 MG tablet Commonly known as:  COREG Take 25 mg by mouth 2 (two) times daily with a meal.   citalopram 20 MG tablet Commonly known as:  CELEXA Take 20 mg by mouth daily.   clopidogrel 75 MG tablet Commonly known as:  PLAVIX Take 75 mg by mouth daily.   dexamethasone 4 MG tablet Commonly known as:  DECADRON Take 12 mg by mouth See admin instructions. 12 mg the night before Chemo treatment, and 12 mg the morning of Chemo treatment   diphenhydrAMINE 25 MG tablet Commonly known as:  BENADRYL Take 25 mg by mouth at bedtime as needed for sleep.   loperamide 2 MG tablet Commonly known as:  IMODIUM A-D Take 2-4 mg by mouth daily as needed for diarrhea  or loose stools.   LORazepam 0.5 MG tablet Commonly known as:  ATIVAN Take 0.5 mg by mouth every 4 (four) hours as needed for anxiety.   omeprazole 20 MG capsule Commonly known as:  PRILOSEC Take 20 mg by mouth daily.   ondansetron 4 MG tablet Commonly known as:  ZOFRAN Take 4 mg by mouth every 4 (four) hours as needed for nausea or vomiting.   PACLitaxel 300 MG/50ML injection Commonly known as:  TAXOL Inject into the vein See admin instructions. Inject into the vein. Takes once a week for 3 weeks then off for a week   Prednisolon-Gatiflox-Bromfenac 1-0.5-0.075 % Susp Place 1 drop into the right eye See admin instructions. Starting aug 19 1 drops in right eye 3 times daily for 1 week 1 drop in right eye 2 times daily for 1 week 1 drop in right eye daily for 14 days   prochlorperazine 10  MG tablet Commonly known as:  COMPAZINE Take 10 mg by mouth every 6 (six) hours as needed for nausea or vomiting.            Discharge Care Instructions        Start     Ordered   04/10/17 0000  Discharge instructions    Comments:  Walk at home as much as possible, at least 4 times / day   04/10/17 0847   04/10/17 0000  Increase activity slowly     04/10/17 0847   04/10/17 0000  May walk up steps     04/10/17 0847   04/10/17 0000  May shower / Bathe    Comments:  48 hours after surgery   04/10/17 0847   04/10/17 0000  Lifting restrictions    Comments:  No lifting > 10 lbs   04/10/17 0847   04/10/17 0000  Other Restrictions    Comments:  No bending/twisting at waist   04/10/17 0847   04/10/17 0000  Diet - low sodium heart healthy     04/10/17 0847   04/10/17 0000  No dressing needed     04/10/17 0847   04/10/17 0000  Call MD for:  temperature >100.4     04/10/17 0847   04/10/17 0000  Call MD for:  redness, tenderness, or signs of infection (pain, swelling, redness, odor or green/yellow discharge around incision site)     04/10/17 0847     Follow-up Information    Consuella Lose, MD. Schedule an appointment as soon as possible for a visit in 2 week(s).   Specialty:  Neurosurgery Contact information: [address] [place] [address] [place] [address] [phone]           Signed: Jairo Ben 04/10/2017, 8:47 AM

## 2017-04-21 ENCOUNTER — Other Ambulatory Visit: Payer: Self-pay | Admitting: Neurosurgery

## 2017-04-21 ENCOUNTER — Other Ambulatory Visit (HOSPITAL_COMMUNITY): Payer: Self-pay | Admitting: Neurosurgery

## 2017-04-21 DIAGNOSIS — I671 Cerebral aneurysm, nonruptured: Secondary | ICD-10-CM

## 2017-05-25 ENCOUNTER — Other Ambulatory Visit: Payer: Self-pay | Admitting: Student

## 2017-05-25 ENCOUNTER — Encounter (HOSPITAL_COMMUNITY): Payer: Self-pay | Admitting: *Deleted

## 2017-05-25 NOTE — Progress Notes (Signed)
Spoke with pt for pre-op call. Pt denies any cardiac history, chest pain or sob. Pt states she is not diabetic. Pt states that she has been taking Plavix and Aspirin and knows to take in the AM.

## 2017-05-25 NOTE — Anesthesia Preprocedure Evaluation (Addendum)
Anesthesia Evaluation  Patient identified by MRN, date of birth, ID band Patient awake    Reviewed: Allergy & Precautions, NPO status , Patient's Chart, lab work & pertinent test results, reviewed documented beta blocker date and time   Airway Mallampati: II  TM Distance: >3 FB Neck ROM: Full    Dental  (+) Dental Advisory Given, Teeth Intact   Pulmonary Current Smoker,    breath sounds clear to auscultation       Cardiovascular hypertension, Pt. on medications and Pt. on home beta blockers + Peripheral Vascular Disease   Rhythm:Regular Rate:Normal     Neuro/Psych  Headaches, Anxiety Depression Hx cerebral hemorrhage 1986    GI/Hepatic Neg liver ROS, hiatal hernia, GERD  Controlled and Medicated,  Endo/Other    Renal/GU negative Renal ROS     Musculoskeletal  (+) Arthritis ,   Abdominal   Peds  Hematology  (+) anemia ,   Anesthesia Other Findings Breast cancer  Reproductive/Obstetrics                            Anesthesia Physical  Anesthesia Plan  ASA: III  Anesthesia Plan: General   Post-op Pain Management:    Induction: Intravenous  PONV Risk Score and Plan: 2 and Ondansetron, Dexamethasone, Treatment may vary due to age or medical condition and Midazolam  Airway Management Planned: Oral ETT  Additional Equipment: Arterial line  Intra-op Plan:   Post-operative Plan: Extubation in OR  Informed Consent: I have reviewed the patients History and Physical, chart, labs and discussed the procedure including the risks, benefits and alternatives for the proposed anesthesia with the patient or authorized representative who has indicated his/her understanding and acceptance.   Dental advisory given  Plan Discussed with: CRNA  Anesthesia Plan Comments:        Anesthesia Quick Evaluation

## 2017-05-26 ENCOUNTER — Inpatient Hospital Stay (HOSPITAL_COMMUNITY): Payer: Medicaid Other | Admitting: Certified Registered Nurse Anesthetist

## 2017-05-26 ENCOUNTER — Inpatient Hospital Stay (HOSPITAL_COMMUNITY)
Admission: RE | Admit: 2017-05-26 | Discharge: 2017-05-27 | DRG: 272 | Disposition: A | Discharge status: Home / Self Care | Payer: Medicaid Other | Source: Ambulatory Visit | Attending: Neurosurgery | Admitting: Neurosurgery

## 2017-05-26 ENCOUNTER — Encounter (HOSPITAL_COMMUNITY)
Admission: RE | Disposition: A | Discharge status: Home / Self Care | Payer: Self-pay | Source: Ambulatory Visit | Attending: Neurosurgery

## 2017-05-26 ENCOUNTER — Other Ambulatory Visit: Payer: Self-pay

## 2017-05-26 ENCOUNTER — Ambulatory Visit (HOSPITAL_COMMUNITY)
Admission: RE | Admit: 2017-05-26 | Discharge: 2017-05-26 | Disposition: A | Discharge status: Home / Self Care | Payer: Medicaid Other | Source: Ambulatory Visit | Attending: Neurosurgery | Admitting: Neurosurgery

## 2017-05-26 ENCOUNTER — Encounter (HOSPITAL_COMMUNITY): Payer: Self-pay | Admitting: *Deleted

## 2017-05-26 DIAGNOSIS — Z9841 Cataract extraction status, right eye: Secondary | ICD-10-CM | POA: Diagnosis not present

## 2017-05-26 DIAGNOSIS — K219 Gastro-esophageal reflux disease without esophagitis: Secondary | ICD-10-CM | POA: Diagnosis present

## 2017-05-26 DIAGNOSIS — D649 Anemia, unspecified: Secondary | ICD-10-CM | POA: Diagnosis present

## 2017-05-26 DIAGNOSIS — Z79899 Other long term (current) drug therapy: Secondary | ICD-10-CM

## 2017-05-26 DIAGNOSIS — Z7982 Long term (current) use of aspirin: Secondary | ICD-10-CM

## 2017-05-26 DIAGNOSIS — M1990 Unspecified osteoarthritis, unspecified site: Secondary | ICD-10-CM

## 2017-05-26 DIAGNOSIS — Z853 Personal history of malignant neoplasm of breast: Secondary | ICD-10-CM

## 2017-05-26 DIAGNOSIS — I671 Cerebral aneurysm, nonruptured: Secondary | ICD-10-CM | POA: Diagnosis present

## 2017-05-26 DIAGNOSIS — I726 Aneurysm of vertebral artery: Principal | ICD-10-CM | POA: Diagnosis present

## 2017-05-26 DIAGNOSIS — Z886 Allergy status to analgesic agent status: Secondary | ICD-10-CM

## 2017-05-26 DIAGNOSIS — Z9012 Acquired absence of left breast and nipple: Secondary | ICD-10-CM

## 2017-05-26 DIAGNOSIS — F17210 Nicotine dependence, cigarettes, uncomplicated: Secondary | ICD-10-CM | POA: Diagnosis present

## 2017-05-26 DIAGNOSIS — Z888 Allergy status to other drugs, medicaments and biological substances status: Secondary | ICD-10-CM | POA: Diagnosis not present

## 2017-05-26 DIAGNOSIS — I10 Essential (primary) hypertension: Secondary | ICD-10-CM | POA: Diagnosis present

## 2017-05-26 DIAGNOSIS — Z7901 Long term (current) use of anticoagulants: Secondary | ICD-10-CM

## 2017-05-26 DIAGNOSIS — F418 Other specified anxiety disorders: Secondary | ICD-10-CM | POA: Diagnosis present

## 2017-05-26 HISTORY — PX: IR ANGIOGRAM FOLLOW UP STUDY: IMG697

## 2017-05-26 HISTORY — PX: IR 3D INDEPENDENT WKST: IMG2385

## 2017-05-26 HISTORY — DX: Depression, unspecified: F32.A

## 2017-05-26 HISTORY — DX: Diverticulosis of intestine, part unspecified, without perforation or abscess without bleeding: K57.90

## 2017-05-26 HISTORY — DX: Major depressive disorder, single episode, unspecified: F32.9

## 2017-05-26 HISTORY — PX: IR TRANSCATH/EMBOLIZ: IMG695

## 2017-05-26 HISTORY — DX: Polyneuropathy, unspecified: G62.9

## 2017-05-26 HISTORY — PX: RADIOLOGY WITH ANESTHESIA: SHX6223

## 2017-05-26 HISTORY — PX: IR ANGIO VERTEBRAL SEL VERTEBRAL UNI L MOD SED: IMG5367

## 2017-05-26 LAB — CBC WITH DIFFERENTIAL/PLATELET
Basophils Absolute: 0 10*3/uL (ref 0.0–0.1)
Basophils Relative: 0 %
EOS ABS: 0.2 10*3/uL (ref 0.0–0.7)
EOS PCT: 4 %
HCT: 37.6 % (ref 36.0–46.0)
HEMOGLOBIN: 12.1 g/dL (ref 12.0–15.0)
LYMPHS ABS: 1.8 10*3/uL (ref 0.7–4.0)
LYMPHS PCT: 29 %
MCH: 30.1 pg (ref 26.0–34.0)
MCHC: 32.2 g/dL (ref 30.0–36.0)
MCV: 93.5 fL (ref 78.0–100.0)
MONOS PCT: 7 %
Monocytes Absolute: 0.4 10*3/uL (ref 0.1–1.0)
Neutro Abs: 3.6 10*3/uL (ref 1.7–7.7)
Neutrophils Relative %: 60 %
Platelets: 274 10*3/uL (ref 150–400)
RBC: 4.02 MIL/uL (ref 3.87–5.11)
RDW: 12.9 % (ref 11.5–15.5)
WBC: 6 10*3/uL (ref 4.0–10.5)

## 2017-05-26 LAB — BASIC METABOLIC PANEL
Anion gap: 8 (ref 5–15)
BUN: 10 mg/dL (ref 6–20)
CHLORIDE: 104 mmol/L (ref 101–111)
CO2: 24 mmol/L (ref 22–32)
CREATININE: 0.88 mg/dL (ref 0.44–1.00)
Calcium: 9 mg/dL (ref 8.9–10.3)
GFR calc Af Amer: >60 mL/min (ref >60)
GFR calc non Af Amer: >60 mL/min (ref >60)
Glucose, Bld: 100 mg/dL — ABNORMAL HIGH (ref 65–99)
POTASSIUM: 4 mmol/L (ref 3.5–5.1)
Sodium: 136 mmol/L (ref 135–145)

## 2017-05-26 LAB — CBC
HEMATOCRIT: 31.5 % — AB (ref 36.0–46.0)
Hemoglobin: 10 g/dL — ABNORMAL LOW (ref 12.0–15.0)
MCH: 30 pg (ref 26.0–34.0)
MCHC: 31.7 g/dL (ref 30.0–36.0)
MCV: 94.6 fL (ref 78.0–100.0)
PLATELETS: 227 10*3/uL (ref 150–400)
RBC: 3.33 MIL/uL — AB (ref 3.87–5.11)
RDW: 12.7 % (ref 11.5–15.5)
WBC: 8.2 10*3/uL (ref 4.0–10.5)

## 2017-05-26 LAB — CREATININE, SERUM
Creatinine, Ser: 0.81 mg/dL (ref 0.44–1.00)
GFR calc Af Amer: >60 mL/min (ref >60)
GFR calc non Af Amer: >60 mL/min (ref >60)

## 2017-05-26 LAB — URINALYSIS, ROUTINE W REFLEX MICROSCOPIC
BILIRUBIN URINE: NEGATIVE
Glucose, UA: NEGATIVE mg/dL
Hgb urine dipstick: NEGATIVE
KETONES UR: NEGATIVE mg/dL
Leukocytes, UA: NEGATIVE
NITRITE: NEGATIVE
PROTEIN: NEGATIVE mg/dL
Specific Gravity, Urine: 1.02 (ref 1.005–1.030)
pH: 6 (ref 5.0–8.0)

## 2017-05-26 LAB — PROTIME-INR
INR: 1
PROTHROMBIN TIME: 13.1 s (ref 11.4–15.2)

## 2017-05-26 LAB — MRSA PCR SCREENING: MRSA BY PCR: NEGATIVE

## 2017-05-26 LAB — APTT: aPTT: 27 seconds (ref 24–36)

## 2017-05-26 SURGERY — IR WITH ANESTHESIA
Anesthesia: General

## 2017-05-26 MED ORDER — PROMETHAZINE HCL 25 MG/ML IJ SOLN: 6.2500 mg | INTRAMUSCULAR | Status: DC | PRN

## 2017-05-26 MED ORDER — ONDANSETRON HCL 4 MG PO TABS: 4.0000 mg | ORAL_TABLET | ORAL | Status: DC | PRN

## 2017-05-26 MED ORDER — CALCIUM CARBONATE-VITAMIN D 500-200 MG-UNIT PO TABS
1.0000 | ORAL_TABLET | Freq: Two times a day (BID) | ORAL | Status: DC
  Administered 2017-05-26 – 2017-05-27 (×3): 1 via ORAL
  Filled 2017-05-26 (×3): qty 1

## 2017-05-26 MED ORDER — DIPHENHYDRAMINE HCL 25 MG PO CAPS: 25.0000 mg | ORAL_CAPSULE | Freq: Every evening | ORAL | Status: DC | PRN

## 2017-05-26 MED ORDER — PANTOPRAZOLE SODIUM 40 MG PO TBEC
40.0000 mg | DELAYED_RELEASE_TABLET | Freq: Every day | ORAL | Status: DC
  Administered 2017-05-27: 40 mg via ORAL
  Filled 2017-05-26: qty 1

## 2017-05-26 MED ORDER — MIDAZOLAM HCL 2 MG/2ML IJ SOLN
INTRAMUSCULAR | Status: AC
  Filled 2017-05-26: qty 2

## 2017-05-26 MED ORDER — CEFAZOLIN SODIUM-DEXTROSE 2-4 GM/100ML-% IV SOLN
2.0000 g | INTRAVENOUS | Status: AC
Start: 2017-05-26 — End: 2017-05-26
  Administered 2017-05-26: 2 g via INTRAVENOUS

## 2017-05-26 MED ORDER — LIDOCAINE HCL (CARDIAC) 20 MG/ML IV SOLN
INTRAVENOUS | Status: DC | PRN
  Administered 2017-05-26: 80 mg via INTRAVENOUS

## 2017-05-26 MED ORDER — IOPAMIDOL (ISOVUE-300) INJECTION 61%
INTRAVENOUS | Status: AC
  Administered 2017-05-26: 35 mL
  Filled 2017-05-26: qty 150

## 2017-05-26 MED ORDER — CEFAZOLIN SODIUM-DEXTROSE 2-4 GM/100ML-% IV SOLN
INTRAVENOUS | Status: AC
  Filled 2017-05-26: qty 100

## 2017-05-26 MED ORDER — HEPARIN SODIUM (PORCINE) 1000 UNIT/ML IJ SOLN
INTRAMUSCULAR | Status: DC | PRN
  Administered 2017-05-26: 5000 [IU] via INTRAVENOUS

## 2017-05-26 MED ORDER — FENTANYL CITRATE (PF) 100 MCG/2ML IJ SOLN
INTRAMUSCULAR | Status: DC | PRN
  Administered 2017-05-26 (×3): 50 ug via INTRAVENOUS

## 2017-05-26 MED ORDER — PROPOFOL 10 MG/ML IV BOLUS
INTRAVENOUS | Status: DC | PRN
  Administered 2017-05-26: 200 mg via INTRAVENOUS

## 2017-05-26 MED ORDER — SODIUM CHLORIDE 0.9 % IV SOLN
INTRAVENOUS | Status: DC | PRN
  Administered 2017-05-26: 08:00:00 via INTRAVENOUS

## 2017-05-26 MED ORDER — LORAZEPAM 1 MG PO TABS: 0.5000 mg | ORAL_TABLET | ORAL | Status: DC | PRN

## 2017-05-26 MED ORDER — ROCURONIUM BROMIDE 100 MG/10ML IV SOLN
INTRAVENOUS | Status: DC | PRN
  Administered 2017-05-26: 50 mg via INTRAVENOUS
  Administered 2017-05-26 (×2): 15 mg via INTRAVENOUS

## 2017-05-26 MED ORDER — CLOPIDOGREL BISULFATE 75 MG PO TABS
75.0000 mg | ORAL_TABLET | Freq: Every day | ORAL | Status: DC
  Administered 2017-05-27: 75 mg via ORAL
  Filled 2017-05-26: qty 1

## 2017-05-26 MED ORDER — ACETAMINOPHEN 500 MG PO TABS: 1000.0000 mg | ORAL_TABLET | Freq: Two times a day (BID) | ORAL | Status: DC | PRN

## 2017-05-26 MED ORDER — MIDAZOLAM HCL 5 MG/5ML IJ SOLN
INTRAMUSCULAR | Status: DC | PRN
Start: 2017-05-26 — End: 2017-05-26
  Administered 2017-05-26: 2 mg via INTRAVENOUS

## 2017-05-26 MED ORDER — ASPIRIN 325 MG PO TABS
325.0000 mg | ORAL_TABLET | Freq: Every day | ORAL | Status: DC
  Administered 2017-05-27: 325 mg via ORAL
  Filled 2017-05-26: qty 1

## 2017-05-26 MED ORDER — CHLORHEXIDINE GLUCONATE CLOTH 2 % EX PADS: 6.0000 | MEDICATED_PAD | Freq: Once | CUTANEOUS | Status: DC

## 2017-05-26 MED ORDER — HYDROCODONE-ACETAMINOPHEN 5-325 MG PO TABS: 1.0000 | ORAL_TABLET | ORAL | Status: DC | PRN

## 2017-05-26 MED ORDER — ONDANSETRON HCL 4 MG/2ML IJ SOLN
INTRAMUSCULAR | Status: DC | PRN
  Administered 2017-05-26: 4 mg via INTRAVENOUS

## 2017-05-26 MED ORDER — DEXAMETHASONE SODIUM PHOSPHATE 10 MG/ML IJ SOLN
INTRAMUSCULAR | Status: DC | PRN
  Administered 2017-05-26: 4 mg via INTRAVENOUS

## 2017-05-26 MED ORDER — LABETALOL HCL 5 MG/ML IV SOLN
INTRAVENOUS | Status: DC | PRN
  Administered 2017-05-26: 5 mg via INTRAVENOUS
  Administered 2017-05-26: 10 mg via INTRAVENOUS

## 2017-05-26 MED ORDER — DIPHENHYDRAMINE HCL 25 MG PO TABS
25.0000 mg | ORAL_TABLET | Freq: Every evening | ORAL | Status: DC | PRN
  Administered 2017-05-26: 25 mg via ORAL
  Filled 2017-05-26 (×2): qty 1

## 2017-05-26 MED ORDER — SUGAMMADEX SODIUM 200 MG/2ML IV SOLN
INTRAVENOUS | Status: DC | PRN
  Administered 2017-05-26: 200 mg via INTRAVENOUS

## 2017-05-26 MED ORDER — LACTATED RINGERS IV SOLN
INTRAVENOUS | Status: DC
  Administered 2017-05-26: 07:00:00 via INTRAVENOUS

## 2017-05-26 MED ORDER — HEPARIN (PORCINE) IN NACL 100-0.45 UNIT/ML-% IJ SOLN
INTRAMUSCULAR | Status: AC
  Filled 2017-05-26: qty 250

## 2017-05-26 MED ORDER — LOPERAMIDE HCL 2 MG PO CAPS: 2.0000 mg | ORAL_CAPSULE | Freq: Every day | ORAL | Status: DC | PRN

## 2017-05-26 MED ORDER — SODIUM CHLORIDE 0.9 % IV SOLN
INTRAVENOUS | Status: DC
  Administered 2017-05-26: 16:00:00 via INTRAVENOUS

## 2017-05-26 MED ORDER — FENTANYL CITRATE (PF) 100 MCG/2ML IJ SOLN
INTRAMUSCULAR | Status: AC
  Filled 2017-05-26: qty 4

## 2017-05-26 MED ORDER — PROCHLORPERAZINE MALEATE 10 MG PO TABS
10.0000 mg | ORAL_TABLET | Freq: Four times a day (QID) | ORAL | Status: DC | PRN
  Filled 2017-05-26: qty 1

## 2017-05-26 MED ORDER — LABETALOL HCL 5 MG/ML IV SOLN: 10.0000 mg | INTRAVENOUS | Status: DC | PRN

## 2017-05-26 MED ORDER — FENTANYL CITRATE (PF) 100 MCG/2ML IJ SOLN: 25.0000 ug | INTRAMUSCULAR | Status: DC | PRN

## 2017-05-26 MED ORDER — CITALOPRAM HYDROBROMIDE 10 MG PO TABS
20.0000 mg | ORAL_TABLET | Freq: Every day | ORAL | Status: DC
  Administered 2017-05-27: 20 mg via ORAL
  Filled 2017-05-26: qty 2

## 2017-05-26 MED ORDER — PHENYLEPHRINE HCL 10 MG/ML IJ SOLN
INTRAVENOUS | Status: DC | PRN
  Administered 2017-05-26: 35 ug/min via INTRAVENOUS

## 2017-05-26 MED ORDER — HEPARIN SODIUM (PORCINE) 5000 UNIT/ML IJ SOLN: 5000.0000 [IU] | Freq: Three times a day (TID) | INTRAMUSCULAR | Status: DC

## 2017-05-26 MED ORDER — CARVEDILOL 12.5 MG PO TABS
25.0000 mg | ORAL_TABLET | Freq: Two times a day (BID) | ORAL | Status: DC
  Administered 2017-05-26 – 2017-05-27 (×2): 25 mg via ORAL
  Filled 2017-05-26 (×2): qty 2

## 2017-05-26 MED ORDER — CALCIUM CITRATE-VITAMIN D 250-200 MG-UNIT PO TABS: ORAL_TABLET | Freq: Two times a day (BID) | ORAL | Status: DC

## 2017-05-26 NOTE — Brief Op Note (Signed)
05/26/2017  10:51 AM  PATIENT:  Diane Short  61 y.o. female  PRE-OPERATIVE DIAGNOSIS:  I67.1 non ruptured cerebral aneurysm  POST-OPERATIVE DIAGNOSIS:  * No post-op diagnosis entered *  PROCEDURE:  Procedure(s): Stent supported coil embolization (N/A) left PICA aneurysm  SURGEON:  Surgeon(s) and Role:    Consuella Lose, MD - Primary  PHYSICIAN ASSISTANT:   ASSISTANTS: none   ANESTHESIA:   general  EBL:  10 mL   BLOOD ADMINISTERED:none  DRAINS: none   LOCAL MEDICATIONS USED:  NONE  SPECIMEN:  No Specimen  DISPOSITION OF SPECIMEN:  N/A  COUNTS:  YES  TOURNIQUET:  * No tourniquets in log *  DICTATION: .Note written in EPIC  PLAN OF CARE: Admit to inpatient   PATIENT DISPOSITION:  PACU - hemodynamically stable.   Delay start of Pharmacological VTE agent (>24hrs) due to surgical blood loss or risk of bleeding: no

## 2017-05-26 NOTE — Anesthesia Procedure Notes (Signed)
Arterial Line Insertion Start/End11/13/2018 8:54 AM, 05/26/2017 8:59 AM Performed by: Audry Pili, MD, anesthesiologist  Patient location: OR. Preanesthetic checklist: patient identified, IV checked, risks and benefits discussed, surgical consent, monitors and equipment checked, pre-op evaluation, timeout performed and anesthesia consent Lidocaine 1% used for infiltration Right, radial was placed Catheter size: 20 Fr Hand hygiene performed  and maximum sterile barriers used   Attempts: 1 Procedure performed without using ultrasound guided technique. Following insertion, dressing applied and Biopatch. Post procedure assessment: normal and unchanged  Patient tolerated the procedure well with no immediate complications. Additional procedure comments: Placed post induction per surgeon preference.

## 2017-05-26 NOTE — Anesthesia Procedure Notes (Signed)
Procedure Name: Intubation Date/Time: 05/26/2017 8:57 AM Performed by: Inda Coke, CRNA Pre-anesthesia Checklist: Patient identified, Emergency Drugs available, Suction available and Patient being monitored Patient Re-evaluated:Patient Re-evaluated prior to induction Oxygen Delivery Method: Circle System Utilized Preoxygenation: Pre-oxygenation with 100% oxygen Induction Type: IV induction Ventilation: Mask ventilation without difficulty Laryngoscope Size: Mac and 3 Grade View: Grade I Tube type: Oral Tube size: 7.0 mm Number of attempts: 1 Airway Equipment and Method: Stylet and Oral airway Placement Confirmation: ETT inserted through vocal cords under direct vision,  positive ETCO2 and breath sounds checked- equal and bilateral Secured at: 21 cm Tube secured with: Tape Dental Injury: Teeth and Oropharynx as per pre-operative assessment

## 2017-05-26 NOTE — H&P (Signed)
Chief Complaint  Brain aneurysms  History of Present Illness  Diane Short is a 61 y.o. female with a history of metastatic breast cancer, and multiple intracranial aneurysms with a history of subarachnoid hemorrhage.  She has undergone treatment of the right middle cerebral artery aneurysm, as well as the left internal carotid artery aneurysm over the last few months.  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  She remains on aspirin and Plavix, and has taken a dose this morning.  She does not report any abnormal bleeding, bruising, etc.  Past Medical History   Past Medical History:  Diagnosis Date  . 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 Anxiety   . Arthritis   . Cancer [facility])    breast cancer  . Cerebral hemorrhage ([place]) 1986  . Depression    due to cancer dx  . Diverticulosis   . GERD (gastroesophageal reflux disease)   . Headache   . History of hiatal hernia   . Hypertension   . 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 Pneumonia     Past Surgical History   Past Surgical History:  Procedure Laterality Date  . BREAST SURGERY Left 11/2016   metastic to bone  . CATARACT EXTRACTION Right 03/02/2017  . CEREBRAL ANGIOGRAM  1986  . CESAREAN SECTION  1980  . IR 3D INDEPENDENT WKST  01/20/2017  . IR ANGIO INTRA EXTRACRAN SEL COM CAROTID INNOMINATE UNI R MOD SED  04/09/2017  . IR ANGIO INTRA EXTRACRAN SEL INTERNAL CAROTID BILAT MOD SED  01/20/2017  . IR ANGIO INTRA EXTRACRAN SEL INTERNAL CAROTID UNI R MOD SED  03/13/2017  . IR ANGIO VERTEBRAL SEL VERTEBRAL BILAT MOD SED  01/20/2017  . IR ANGIOGRAM FOLLOW UP STUDY  03/13/2017  . IR ANGIOGRAM FOLLOW UP STUDY  03/13/2017  . IR ANGIOGRAM FOLLOW UP STUDY  03/13/2017  . IR ANGIOGRAM FOLLOW UP STUDY  03/13/2017  . IR ANGIOGRAM FOLLOW UP STUDY  03/13/2017  . IR ANGIOGRAM FOLLOW UP STUDY  03/13/2017  . IR ANGIOGRAM FOLLOW UP STUDY  03/13/2017  . IR ANGIOGRAM FOLLOW UP STUDY  03/13/2017  . IR ANGIOGRAM FOLLOW UP STUDY  03/13/2017  .  IR ANGIOGRAM FOLLOW UP STUDY  03/13/2017  . IR ANGIOGRAM FOLLOW UP STUDY  03/13/2017  . IR ANGIOGRAM FOLLOW UP STUDY  03/13/2017  . IR ANGIOGRAM FOLLOW UP STUDY  04/09/2017  . IR NEURO EACH ADD'L AFTER BASIC UNI LEFT (MS)  04/09/2017  . IR NEURO EACH ADD'L AFTER BASIC UNI RIGHT (MS)  03/13/2017  . IR TRANSCATH/EMBOLIZ  03/13/2017  . IR TRANSCATH/EMBOLIZ  04/09/2017  . IR US GUIDE VASC ACCESS RIGHT  04/09/2017  . MASTECTOMY Left   . port a cathh  04/2016  . TUBAL LIGATION  1983    Social History   Social History   Tobacco Use  . Smoking status: Current Every Day Smoker    Packs/day: 0.50    Years: 42.00    Pack years: 21.00    Types: Cigarettes  . Smokeless tobacco: Never Used  Substance Use Topics  . Alcohol use: No  . Drug use: No    Medications   Prior to Admission medications   Medication Sig Start Date End Date Taking? Authorizing Provider  acetaminophen (TYLENOL) 500 MG tablet Take 1,000 mg by mouth 2 (two) times daily as needed for mild pain.    Yes [provider]  aspirin 325 MG tablet Take  325 mg by mouth daily.   Yes [provider]  Calcium Citrate-Vitamin D (CALCIUM CITRATE + D3 PO) Take 1-2 tablets by mouth 2 (two) times daily. Takes 2 in the am and 1 in the evening   Yes [provider]  carvedilol (COREG) 25 MG tablet Take 25 mg by mouth 2 (two) times daily with a meal.   Yes [provider]  citalopram (CELEXA) 20 MG tablet Take 20 mg by mouth daily.   Yes [provider]  clopidogrel (PLAVIX) 75 MG tablet Take 75 mg by mouth daily.   Yes [provider]  diphenhydrAMINE (BENADRYL) 25 MG tablet Take 25 mg by mouth at bedtime as needed for sleep.   Yes [provider]  omeprazole (PRILOSEC) 20 MG capsule Take 20 mg by mouth daily.   Yes [provider]  dexamethasone (DECADRON) 4 MG tablet Take 12 mg by mouth See admin instructions. 12 mg the night before Chemo treatment, and 12 mg the morning of  Chemo treatment    [provider]  loperamide (IMODIUM A-D) 2 MG tablet Take 2-4 mg by mouth daily as needed for diarrhea or loose stools.    [provider]  LORazepam (ATIVAN) 0.5 MG tablet Take 0.5 mg by mouth every 4 (four) hours as needed for anxiety.    [provider]  ondansetron (ZOFRAN) 4 MG tablet Take 4 mg by mouth every 4 (four) hours as needed for nausea or vomiting.    [provider]  PACLitaxel (TAXOL) 300 MG/50ML injection Inject into the vein See admin instructions. Inject into the vein. Takes once a week for 3 weeks then off for a week    [provider]  prochlorperazine (COMPAZINE) 10 MG tablet Take 10 mg by mouth every 6 (six) hours as needed for nausea or vomiting.    [provider]    Allergies   Allergies  Allergen Reactions  . Nsaids Other (See Comments)    GI upset   . Ace Inhibitors Other (See Comments)    cough    Review of Systems  ROS  Neurologic Exam  Awake, alert, oriented Memory and concentration grossly intact Speech fluent, appropriate CN grossly intact Motor exam: Upper Extremities Deltoid Bicep Tricep Grip  Right 5/5 5/5 5/5 5/5  Left 5/5 5/5 5/5 5/5   Lower Extremities IP Quad PF DF EHL  Right 5/5 5/5 5/5 5/5 5/5  Left 5/5 5/5 5/5 5/5 5/5   Sensation grossly intact to LT  Imaging  Approximately 5 x 3-1/2 mm left posterior inferior cerebellar artery aneurysm  Impression  - 61 y.o. female with multiple intracranial aneurysms including a untreated 5 mm left PICA aneurysm.  Plan  - Proceed with stent-coiling of LPICA aneurysm  I have reviewed the risks, benefits, and alternatives to the procedure with the patient and family. They are fully aware of the procedure. All questions were answered and consent was obtained.

## 2017-05-26 NOTE — Sedation Documentation (Signed)
Bedside report given. Dressing to right groin dry and intact. Pulses present. Pt awake and alert. In no distress.

## 2017-05-26 NOTE — Sedation Documentation (Signed)
Pt brought to IR2 by CRNA. Awake and alert. In no distress. Speech clear and appropriate. MAE well

## 2017-05-26 NOTE — Sedation Documentation (Signed)
Sheath to right groin removed. V-Pad applied 

## 2017-05-26 NOTE — Progress Notes (Signed)
This RN noted hematoma on pt right groin/lower abd upon arrival to unit. Called Dr. Kathyrn Sheriff. Held pressure until IR tech at bedside at 1315.

## 2017-05-26 NOTE — Transfer of Care (Signed)
Immediate Anesthesia Transfer of Care Note  Patient: Diane Short  Procedure(s) Performed: Stent supported coil embolization (N/A )  Patient Location: PACU  Anesthesia Type:General  Level of Consciousness: awake, alert  and oriented  Airway & Oxygen Therapy: Patient Spontanous Breathing and Patient connected to nasal cannula oxygen  Post-op Assessment: Report given to RN, Post -op Vital signs reviewed and stable, Patient moving all extremities X 4 and Patient able to stick tongue midline  Post vital signs: Reviewed and stable  Last Vitals:  Vitals:   05/26/17 0639  BP: 129/81  Pulse: 78  Resp: 18  Temp: 36.7 C  SpO2: 99%    Last Pain:  Vitals:   05/26/17 0639  TempSrc: Oral      Patients Stated Pain Goal: 4 (10/62/69 4854)  Complications: No apparent anesthesia complications

## 2017-05-26 NOTE — Anesthesia Postprocedure Evaluation (Signed)
Anesthesia Post Note  Patient: Evianna Chandran  Procedure(s) Performed: Stent supported coil embolization (N/A )     Patient location during evaluation: PACU Anesthesia Type: General Level of consciousness: awake and alert Pain management: pain level controlled Vital Signs Assessment: post-procedure vital signs reviewed and stable Respiratory status: spontaneous breathing, nonlabored ventilation, respiratory function stable and patient connected to nasal cannula oxygen Cardiovascular status: blood pressure returned to baseline and stable Postop Assessment: no apparent nausea or vomiting Anesthetic complications: no    Last Vitals:  Vitals:   05/26/17 1214 05/26/17 1215  BP: 95/69   Pulse: 82 84  Resp: 14 13  Temp:    SpO2: 95% 96%    Last Pain:  Vitals:   05/26/17 0639  TempSrc: Oral                 Audry Pili

## 2017-05-27 ENCOUNTER — Encounter (HOSPITAL_COMMUNITY): Payer: Self-pay | Admitting: Neurosurgery

## 2017-05-27 NOTE — Progress Notes (Signed)
Patient up and walking this am, eating breakfast, voided, and spoke with MD. Waiting on discharge orders. IV removed. Zalan Shidler, Rande Brunt, RN

## 2017-05-27 NOTE — Discharge Summary (Addendum)
Physician Discharge Summary  Patient ID: Diane Short MRN: [id number] DOB/AGE: Nov 28, 1955 61 y.o.  Admit date: 05/26/2017 Discharge date: 05/27/2017  Admission Diagnoses:  Left PICA aneurysm  Discharge Diagnoses:  Same Active Problems:   Cerebral aneurysm, nonruptured   Discharged Condition: Stable  Hospital Course:  Diane Short is a 61 y.o. female with multiple intracranial aneurysms and breast CA, admitted for elective treatment of left PICA aneurysm. She was at baseline postop, had a small hematoma in the right groin which resolved with continued manual pressure. She was ambulating normally, tolerating diet, and voiding.  Treatments: Surgery - Coiling left PICA aneurysm  Discharge Exam: Blood pressure (!) 121/97, pulse 82, temperature 98.6 F (37 C), temperature source Oral, resp. rate 13, height 5' 4.5" (1.638 m), weight 88.5 kg (195 lb), last menstrual period 08/14/2010, SpO2 96 %. Awake, alert, oriented Speech fluent, appropriate CN grossly intact 5/5 BUE/BLE Wound c/d/i  Disposition: 01-Home or Self Care  Discharge Instructions    Call MD for:  redness, tenderness, or signs of infection (pain, swelling, redness, odor or green/yellow discharge around incision site)   Complete by:  As directed    Call MD for:  temperature >100.4   Complete by:  As directed    Diet - low sodium heart healthy   Complete by:  As directed    Discharge instructions   Complete by:  As directed    Walk at home as much as possible, at least 4 times / day   Increase activity slowly   Complete by:  As directed    Lifting restrictions   Complete by:  As directed    No lifting > 10 lbs   May shower / Bathe   Complete by:  As directed    48 hours after surgery   May walk up steps   Complete by:  As directed    No dressing needed   Complete by:  As directed    Other Restrictions   Complete by:  As directed    No bending/twisting at waist     Allergies as of  05/27/2017      Reactions   Nsaids Other (See Comments)   GI upset    Ace Inhibitors Other (See Comments)   cough      Medication List    TAKE these medications   acetaminophen 500 MG tablet Commonly known as:  TYLENOL Take 1,000 mg by mouth 2 (two) times daily as needed for mild pain.   aspirin 325 MG tablet Take 325 mg by mouth daily.   CALCIUM CITRATE + D3 PO Take 1-2 tablets by mouth 2 (two) times daily. Takes 2 in the am and 1 in the evening   carvedilol 25 MG tablet Commonly known as:  COREG Take 25 mg by mouth 2 (two) times daily with a meal.   citalopram 20 MG tablet Commonly known as:  CELEXA Take 20 mg by mouth daily.   clopidogrel 75 MG tablet Commonly known as:  PLAVIX Take 75 mg by mouth daily.   dexamethasone 4 MG tablet Commonly known as:  DECADRON Take 12 mg by mouth See admin instructions. 12 mg the night before Chemo treatment, and 12 mg the morning of Chemo treatment   diphenhydrAMINE 25 MG tablet Commonly known as:  BENADRYL Take 25 mg by mouth at bedtime as needed for sleep.   loperamide 2 MG tablet Commonly known as:  IMODIUM A-D Take 2-4 mg by mouth daily as needed for  diarrhea or loose stools.   LORazepam 0.5 MG tablet Commonly known as:  ATIVAN Take 0.5 mg by mouth every 4 (four) hours as needed for anxiety.   omeprazole 20 MG capsule Commonly known as:  PRILOSEC Take 20 mg by mouth daily.   ondansetron 4 MG tablet Commonly known as:  ZOFRAN Take 4 mg by mouth every 4 (four) hours as needed for nausea or vomiting.   PACLitaxel 300 MG/50ML injection Commonly known as:  TAXOL Inject into the vein See admin instructions. Inject into the vein. Takes once a week for 3 weeks then off for a week   prochlorperazine 10 MG tablet Commonly known as:  COMPAZINE Take 10 mg by mouth every 6 (six) hours as needed for nausea or vomiting.      Follow-up Information    Diane Lose, MD Follow up in 4 week(s).   Specialty:   Neurosurgery Contact information: [address] [place] [address] [phone]           Signed: Consuella Short, Diane Short 05/27/2017, 8:46 AM

## 2017-05-27 NOTE — Progress Notes (Signed)
Pt seen and examined. No issues overnight. No groin/leg pain, able to ambulate without difficulty. Tolerating diet, voided normally.  EXAM: Temp:  [97.3 F (36.3 C)-99.6 F (37.6 C)] 98.6 F (37 C) (11/14 0400) Pulse Rate:  [67-98] 82 (11/14 0700) Resp:  [10-23] 13 (11/14 0700) BP: (94-156)/(45-97) 121/97 (11/14 0700) SpO2:  [93 %-100 %] 96 % (11/14 0700) Arterial Line BP: (91-156)/(47-84) 97/53 (11/14 0600) Intake/Output      11/13 0701 - 11/14 0700 11/14 0701 - 11/15 0700   P.O.  240   I.V. (mL/kg) 2150 (24.3) 75 (0.8)   Total Intake(mL/kg) 2150 (24.3) 315 (3.6)   Urine (mL/kg/hr) 2950 (1.4)    Blood 10    Total Output 2960    Net -810 +315        Urine Occurrence  1 x    Awake, alert, oriented Speech fluent CN intact Good strength throughout Groin site soft  LABS: Lab Results  Component Value Date   CREATININE 0.81 05/26/2017   BUN 10 05/26/2017   NA 136 05/26/2017   K 4.0 05/26/2017   CL 104 05/26/2017   CO2 24 05/26/2017   Lab Results  Component Value Date   WBC 8.2 05/26/2017   HGB 10.0 (L) 05/26/2017   HCT 31.5 (L) 05/26/2017   MCV 94.6 05/26/2017   PLT 227 05/26/2017    IMPRESSION: - 61 y.o. female POD#1 s/p coiling LPICA aneurysm, at baseline  PLAN: - Will d/c home today

## 2017-06-09 DIAGNOSIS — C50812 Malignant neoplasm of overlapping sites of left female breast: Secondary | ICD-10-CM

## 2017-06-09 DIAGNOSIS — C7951 Secondary malignant neoplasm of bone: Secondary | ICD-10-CM

## 2017-06-09 DIAGNOSIS — C779 Secondary and unspecified malignant neoplasm of lymph node, unspecified: Secondary | ICD-10-CM

## 2017-07-15 DIAGNOSIS — C779 Secondary and unspecified malignant neoplasm of lymph node, unspecified: Secondary | ICD-10-CM | POA: Diagnosis not present

## 2017-07-15 DIAGNOSIS — C7951 Secondary malignant neoplasm of bone: Secondary | ICD-10-CM | POA: Diagnosis not present

## 2017-07-15 DIAGNOSIS — C50812 Malignant neoplasm of overlapping sites of left female breast: Secondary | ICD-10-CM | POA: Diagnosis not present

## 2017-09-08 DIAGNOSIS — C50812 Malignant neoplasm of overlapping sites of left female breast: Secondary | ICD-10-CM | POA: Diagnosis not present

## 2017-09-08 DIAGNOSIS — C779 Secondary and unspecified malignant neoplasm of lymph node, unspecified: Secondary | ICD-10-CM | POA: Diagnosis not present

## 2017-09-08 DIAGNOSIS — C7951 Secondary malignant neoplasm of bone: Secondary | ICD-10-CM | POA: Diagnosis not present

## 2017-10-21 ENCOUNTER — Other Ambulatory Visit: Payer: Self-pay | Admitting: Neurosurgery

## 2017-10-21 DIAGNOSIS — I671 Cerebral aneurysm, nonruptured: Secondary | ICD-10-CM

## 2017-11-17 ENCOUNTER — Other Ambulatory Visit: Payer: Self-pay | Admitting: Neurosurgery

## 2017-11-17 ENCOUNTER — Ambulatory Visit (HOSPITAL_COMMUNITY)
Admission: RE | Admit: 2017-11-17 | Discharge: 2017-11-17 | Disposition: A | Discharge status: Home / Self Care | Payer: Medicaid Other | Source: Ambulatory Visit | Attending: Neurosurgery | Admitting: Neurosurgery

## 2017-11-17 ENCOUNTER — Encounter (HOSPITAL_COMMUNITY): Payer: Self-pay | Admitting: Neurosurgery

## 2017-11-17 DIAGNOSIS — Z853 Personal history of malignant neoplasm of breast: Secondary | ICD-10-CM | POA: Diagnosis not present

## 2017-11-17 DIAGNOSIS — Z7982 Long term (current) use of aspirin: Secondary | ICD-10-CM | POA: Insufficient documentation

## 2017-11-17 DIAGNOSIS — I10 Essential (primary) hypertension: Secondary | ICD-10-CM | POA: Insufficient documentation

## 2017-11-17 DIAGNOSIS — I671 Cerebral aneurysm, nonruptured: Secondary | ICD-10-CM

## 2017-11-17 DIAGNOSIS — F329 Major depressive disorder, single episode, unspecified: Secondary | ICD-10-CM | POA: Insufficient documentation

## 2017-11-17 DIAGNOSIS — Z7902 Long term (current) use of antithrombotics/antiplatelets: Secondary | ICD-10-CM | POA: Insufficient documentation

## 2017-11-17 DIAGNOSIS — Z886 Allergy status to analgesic agent status: Secondary | ICD-10-CM | POA: Diagnosis not present

## 2017-11-17 DIAGNOSIS — G629 Polyneuropathy, unspecified: Secondary | ICD-10-CM | POA: Insufficient documentation

## 2017-11-17 DIAGNOSIS — K219 Gastro-esophageal reflux disease without esophagitis: Secondary | ICD-10-CM | POA: Diagnosis not present

## 2017-11-17 DIAGNOSIS — Z09 Encounter for follow-up examination after completed treatment for conditions other than malignant neoplasm: Secondary | ICD-10-CM | POA: Diagnosis present

## 2017-11-17 DIAGNOSIS — F419 Anxiety disorder, unspecified: Secondary | ICD-10-CM | POA: Insufficient documentation

## 2017-11-17 DIAGNOSIS — F17210 Nicotine dependence, cigarettes, uncomplicated: Secondary | ICD-10-CM | POA: Diagnosis not present

## 2017-11-17 DIAGNOSIS — Z79899 Other long term (current) drug therapy: Secondary | ICD-10-CM | POA: Insufficient documentation

## 2017-11-17 HISTORY — PX: IR ANGIO INTRA EXTRACRAN SEL INTERNAL CAROTID BILAT MOD SED: IMG5363

## 2017-11-17 HISTORY — PX: IR ANGIO VERTEBRAL SEL VERTEBRAL UNI L MOD SED: IMG5367

## 2017-11-17 LAB — CBC WITH DIFFERENTIAL/PLATELET
BASOS ABS: 0 10*3/uL (ref 0.0–0.1)
Basophils Relative: 1 %
Eosinophils Absolute: 0.1 10*3/uL (ref 0.0–0.7)
Eosinophils Relative: 4 %
HCT: 32.8 % — ABNORMAL LOW (ref 36.0–46.0)
Hemoglobin: 10.3 g/dL — ABNORMAL LOW (ref 12.0–15.0)
LYMPHS ABS: 1.1 10*3/uL (ref 0.7–4.0)
Lymphocytes Relative: 52 %
MCH: 31.4 pg (ref 26.0–34.0)
MCHC: 31.4 g/dL (ref 30.0–36.0)
MCV: 100 fL (ref 78.0–100.0)
Monocytes Absolute: 0.1 10*3/uL (ref 0.1–1.0)
Monocytes Relative: 6 %
Neutro Abs: 0.7 10*3/uL — ABNORMAL LOW (ref 1.7–7.7)
Neutrophils Relative %: 37 %
Platelets: 280 10*3/uL (ref 150–400)
RBC: 3.28 MIL/uL — AB (ref 3.87–5.11)
RDW: 16.3 % — AB (ref 11.5–15.5)
WBC: 2 10*3/uL — AB (ref 4.0–10.5)

## 2017-11-17 LAB — BASIC METABOLIC PANEL
ANION GAP: 8 (ref 5–15)
CHLORIDE: 108 mmol/L (ref 101–111)
CO2: 25 mmol/L (ref 22–32)
CREATININE: 0.73 mg/dL (ref 0.44–1.00)
Calcium: 9.1 mg/dL (ref 8.9–10.3)
GFR calc Af Amer: >60 mL/min (ref >60)
GLUCOSE: 110 mg/dL — AB (ref 65–99)
POTASSIUM: 4.8 mmol/L (ref 3.5–5.1)
Sodium: 141 mmol/L (ref 135–145)

## 2017-11-17 LAB — PROTIME-INR
INR: 1.03
Prothrombin Time: 13.4 seconds (ref 11.4–15.2)

## 2017-11-17 LAB — APTT: aPTT: 25 seconds (ref 24–36)

## 2017-11-17 MED ORDER — HYDROCODONE-ACETAMINOPHEN 5-325 MG PO TABS: 1.0000 | ORAL_TABLET | ORAL | Status: DC | PRN

## 2017-11-17 MED ORDER — FENTANYL CITRATE (PF) 100 MCG/2ML IJ SOLN
INTRAMUSCULAR | Status: DC | PRN
  Administered 2017-11-17: 25 ug via INTRAVENOUS

## 2017-11-17 MED ORDER — SODIUM CHLORIDE 0.9 % IV SOLN: INTRAVENOUS | Status: DC

## 2017-11-17 MED ORDER — LIDOCAINE HCL (PF) 1 % IJ SOLN
INTRAMUSCULAR | Status: AC | PRN
  Administered 2017-11-17: 10 mL

## 2017-11-17 MED ORDER — MIDAZOLAM HCL 2 MG/2ML IJ SOLN
INTRAMUSCULAR | Status: AC
  Filled 2017-11-17: qty 2

## 2017-11-17 MED ORDER — HEPARIN SODIUM (PORCINE) 1000 UNIT/ML IJ SOLN
INTRAMUSCULAR | Status: AC
  Filled 2017-11-17: qty 1

## 2017-11-17 MED ORDER — FENTANYL CITRATE (PF) 100 MCG/2ML IJ SOLN
INTRAMUSCULAR | Status: AC
  Filled 2017-11-17: qty 2

## 2017-11-17 MED ORDER — MIDAZOLAM HCL 2 MG/2ML IJ SOLN
INTRAMUSCULAR | Status: DC | PRN
  Administered 2017-11-17: 1 mg via INTRAVENOUS

## 2017-11-17 MED ORDER — LIDOCAINE HCL 1 % IJ SOLN
INTRAMUSCULAR | Status: AC
  Filled 2017-11-17: qty 20

## 2017-11-17 MED ORDER — HEPARIN SODIUM (PORCINE) 1000 UNIT/ML IJ SOLN
INTRAMUSCULAR | Status: DC | PRN
  Administered 2017-11-17: 2000 [IU] via INTRAVENOUS

## 2017-11-17 MED ORDER — IOPAMIDOL (ISOVUE-300) INJECTION 61%
INTRAVENOUS | Status: AC
  Administered 2017-11-17: 45 mL
  Filled 2017-11-17: qty 100

## 2017-11-17 NOTE — Sedation Documentation (Signed)
IR will place exoseal R- 5French to R groin

## 2017-11-17 NOTE — Sedation Documentation (Signed)
Holding pressure R groin 

## 2017-11-17 NOTE — Discharge Instructions (Signed)
°  Drink plenty of fluids over the next 2-3 days ° °Femoral Site Care °Refer to this sheet in the next few weeks. These instructions provide you with information about caring for yourself after your procedure. Your health care provider may also give you more specific instructions. Your treatment has been planned according to current medical practices, but problems sometimes occur. Call your health care provider if you have any problems or questions after your procedure. °What can I expect after the procedure? °After your procedure, it is typical to have the following: °· Bruising at the site that usually fades within 1-2 weeks. °· Blood collecting in the tissue (hematoma) that may be painful to the touch. It should usually decrease in size and tenderness within 1-2 weeks. ° °Follow these instructions at home: °· Take medicines only as directed by your health care provider. °· You may shower 24-48 hours after the procedure or as directed by your health care provider. Remove the bandage (dressing) and gently wash the site with plain soap and water. Pat the area dry with a clean towel. Do not rub the site, because this may cause bleeding. °· Do not take baths, swim, or use a hot tub until your health care provider approves. °· Check your insertion site every day for redness, swelling, or drainage. °· Do not apply powder or lotion to the site. °· Limit use of stairs to twice a day for the first 2-3 days or as directed by your health care provider. °· Do not squat for the first 2-3 days or as directed by your health care provider. °· Do not lift over 10 lb (4.5 kg) for 5 days after your procedure or as directed by your health care provider. °· Ask your health care provider when it is okay to: °? Return to work or school. °? Resume usual physical activities or sports. °? Resume sexual activity. °· Do not drive home if you are discharged the same day as the procedure. Have someone else drive you. °· You may drive 24 hours  after the procedure unless otherwise instructed by your health care provider. °· Do not operate machinery or power tools for 24 hours after the procedure or as directed by your health care provider. °· If your procedure was done as an outpatient procedure, which means that you went home the same day as your procedure, a responsible adult should be with you for the first 24 hours after you arrive home. °· Keep all follow-up visits as directed by your health care provider. This is important. °Contact a health care provider if: °· You have a fever. °· You have chills. °· You have increased bleeding from the site. Hold pressure on the site. °Get help right away if: °· You have unusual pain at the site. °· You have redness, warmth, or swelling at the site. °· You have drainage (other than a small amount of blood on the dressing) from the site. °· The site is bleeding, and the bleeding does not stop after 30 minutes of holding steady pressure on the site. °· Your leg or foot becomes pale, cool, tingly, or numb. °This information is not intended to replace advice given to you by your health care provider. Make sure you discuss any questions you have with your health care provider. °Document Released: 03/03/2014 Document Revised: 12/06/2015 Document Reviewed: 01/17/2014 °Elsevier Interactive Patient Education © 2018 [employer] ° °

## 2017-11-17 NOTE — Progress Notes (Signed)
Dr Kathyrn Sheriff was contacted to verify orders for today. Surgical orders were placed but pt is having an angiogram. Not all orders were completed.

## 2017-11-17 NOTE — Sedation Documentation (Signed)
Patient is resting comfortably. 

## 2017-11-17 NOTE — H&P (Signed)
Chief Complaint  Aneurysms  History of Present Illness  Diane Short is a 62 y.o. female with a history of remote SAH and multiple intracranial aneurysms. She underwent stent-supported coiling of a right MCA aneurysm in 8/18, Pipeline embolization of a LICA aneurysm in 8/75, and coiling of a LPICA aneurysm in 11/18. She has made an excellent recovery and presents today for routine f/u angiogram.  Past Medical History   Past Medical History:  Diagnosis Date  . 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 Anxiety   . Arthritis   . Cancer [facility])    breast cancer  . Cerebral hemorrhage ([place]) 1986  . Depression    due to cancer dx  . Diverticulosis   . GERD (gastroesophageal reflux disease)   . Headache   . History of hiatal hernia   . Hypertension   . 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 Pneumonia     Past Surgical History   Past Surgical History:  Procedure Laterality Date  . BREAST SURGERY Left 11/2016   metastic to bone  . CATARACT EXTRACTION Right 03/02/2017  . CEREBRAL ANGIOGRAM  1986  . CESAREAN SECTION  1980  . IR 3D INDEPENDENT WKST  01/20/2017  . IR 3D INDEPENDENT WKST  05/26/2017  . IR ANGIO INTRA EXTRACRAN SEL COM CAROTID INNOMINATE UNI R MOD SED  04/09/2017  . IR ANGIO INTRA EXTRACRAN SEL INTERNAL CAROTID BILAT MOD SED  01/20/2017  . IR ANGIO INTRA EXTRACRAN SEL INTERNAL CAROTID UNI R MOD SED  03/13/2017  . IR ANGIO VERTEBRAL SEL VERTEBRAL BILAT MOD SED  01/20/2017  . IR ANGIO VERTEBRAL SEL VERTEBRAL UNI L MOD SED  05/26/2017  . IR ANGIOGRAM FOLLOW UP STUDY  03/13/2017  . IR ANGIOGRAM FOLLOW UP STUDY  03/13/2017  . IR ANGIOGRAM FOLLOW UP STUDY  03/13/2017  . IR ANGIOGRAM FOLLOW UP STUDY  03/13/2017  . IR ANGIOGRAM FOLLOW UP STUDY  03/13/2017  . IR ANGIOGRAM FOLLOW UP STUDY  03/13/2017  . IR ANGIOGRAM FOLLOW UP STUDY  03/13/2017  . IR ANGIOGRAM FOLLOW UP STUDY  03/13/2017  . IR ANGIOGRAM FOLLOW UP STUDY  03/13/2017  . IR ANGIOGRAM FOLLOW UP STUDY  03/13/2017  . IR ANGIOGRAM FOLLOW UP  STUDY  03/13/2017  . IR ANGIOGRAM FOLLOW UP STUDY  03/13/2017  . IR ANGIOGRAM FOLLOW UP STUDY  04/09/2017  . IR ANGIOGRAM FOLLOW UP STUDY  05/26/2017  . IR ANGIOGRAM FOLLOW UP STUDY  05/26/2017  . IR NEURO EACH ADD'L AFTER BASIC UNI LEFT (MS)  04/09/2017  . IR NEURO EACH ADD'L AFTER BASIC UNI RIGHT (MS)  03/13/2017  . IR TRANSCATH/EMBOLIZ  03/13/2017  . IR TRANSCATH/EMBOLIZ  04/09/2017  . IR TRANSCATH/EMBOLIZ  05/26/2017  . IR US GUIDE VASC ACCESS RIGHT  04/09/2017  . MASTECTOMY Left   . port a cathh  04/2016  . RADIOLOGY WITH ANESTHESIA N/A 03/13/2017   Procedure: stent coiling of right MCA aneurysm;  Surgeon: Consuella Lose, MD;  Location: [place];  Service: Radiology;  Laterality: N/A;  . RADIOLOGY WITH ANESTHESIA N/A 04/09/2017   Procedure: Arteriogram, pipeline embolization of aneruysm;  Surgeon: Consuella Lose, MD;  Location: [place];  Service: Radiology;  Laterality: N/A;  . RADIOLOGY WITH ANESTHESIA N/A 05/26/2017   Procedure: Stent supported coil embolization;  Surgeon: Consuella Lose, MD;  Location: [place];  Service: Radiology;  Laterality: N/A;  . [place]    Social History   Social History  Tobacco Use  . Smoking status: Current Every Day Smoker    Packs/day: 0.50    Years: 42.00    Pack years: 21.00    Types: Cigarettes  . Smokeless tobacco: Never Used  Substance Use Topics  . Alcohol use: No  . Drug use: No    Medications   Prior to Admission medications   Medication Sig Start Date End Date Taking? Authorizing Provider  acetaminophen (TYLENOL) 500 MG tablet Take 1,000 mg by mouth 2 (two) times daily as needed for mild pain.    Yes [provider]  aspirin 325 MG tablet Take 325 mg by mouth daily.   Yes [provider]  Calcium Citrate-Vitamin D (CALCIUM CITRATE + D3 PO) Take 1-2 tablets by mouth 2 (two) times daily. Takes 2 in the am and 1 in the evening   Yes [provider]  carvedilol (COREG) 25 MG tablet Take 25 mg by  mouth 2 (two) times daily with a meal.   Yes [provider]  citalopram (CELEXA) 20 MG tablet Take 20 mg by mouth daily.   Yes [provider]  clopidogrel (PLAVIX) 75 MG tablet Take 75 mg by mouth daily.   Yes [provider]  dexamethasone (DECADRON) 4 MG tablet Take 12 mg by mouth See admin instructions. 12 mg the night before Chemo treatment, and 12 mg the morning of Chemo treatment   Yes [provider]  diphenhydrAMINE (BENADRYL) 25 MG tablet Take 25 mg by mouth at bedtime as needed for sleep.   Yes [provider]  loperamide (IMODIUM A-D) 2 MG tablet Take 2-4 mg by mouth daily as needed for diarrhea or loose stools.   Yes [provider]  LORazepam (ATIVAN) 0.5 MG tablet Take 0.5 mg by mouth every 4 (four) hours as needed for anxiety.   Yes [provider]  omeprazole (PRILOSEC) 20 MG capsule Take 20 mg by mouth daily.   Yes [provider]  ondansetron (ZOFRAN) 4 MG tablet Take 4 mg by mouth every 4 (four) hours as needed for nausea or vomiting.   Yes [provider]  PACLitaxel (TAXOL) 300 MG/50ML injection Inject into the vein See admin instructions. Inject into the vein. Takes once a week for 3 weeks then off for a week   Yes [provider]  prochlorperazine (COMPAZINE) 10 MG tablet Take 10 mg by mouth every 6 (six) hours as needed for nausea or vomiting.   Yes [provider]    Allergies   Allergies  Allergen Reactions  . Nsaids Other (See Comments)    GI upset   . Ace Inhibitors Other (See Comments)    cough    Review of Systems  ROS  Neurologic Exam  Awake, alert, oriented Memory and concentration grossly intact Speech fluent, appropriate CN grossly intact Motor exam: Upper Extremities Deltoid Bicep Tricep Grip  Right 5/5 5/5 5/5 5/5  Left 5/5 5/5 5/5 5/5   Lower Extremities IP Quad PF DF EHL  Right 5/5 5/5 5/5 5/5 5/5  Left 5/5 5/5 5/5 5/5 5/5   Sensation  grossly intact to LT  Impression  - 62 y.o. female s/p treatment of multiple intracranial aneurysms, here for routine 6 month f/u.  Plan  - Proceed with diagnostic cerebral angiogram  I have reviewed the risks, benefits, and alternatives to the angiogram with the patient and her husband previously. All questions were answered and consent was obtained.

## 2017-11-17 NOTE — Sedation Documentation (Signed)
Hemostasis R groin- bedrest began- dsg on

## 2017-11-18 LAB — PATHOLOGIST SMEAR REVIEW

## 2017-12-01 DIAGNOSIS — C7951 Secondary malignant neoplasm of bone: Secondary | ICD-10-CM | POA: Diagnosis not present

## 2017-12-01 DIAGNOSIS — C50812 Malignant neoplasm of overlapping sites of left female breast: Secondary | ICD-10-CM | POA: Diagnosis not present

## 2017-12-01 DIAGNOSIS — C779 Secondary and unspecified malignant neoplasm of lymph node, unspecified: Secondary | ICD-10-CM | POA: Diagnosis not present

## 2017-12-01 DIAGNOSIS — G620 Drug-induced polyneuropathy: Secondary | ICD-10-CM | POA: Diagnosis not present

## 2017-12-29 DIAGNOSIS — C7951 Secondary malignant neoplasm of bone: Secondary | ICD-10-CM

## 2017-12-29 DIAGNOSIS — D709 Neutropenia, unspecified: Secondary | ICD-10-CM

## 2017-12-29 DIAGNOSIS — C779 Secondary and unspecified malignant neoplasm of lymph node, unspecified: Secondary | ICD-10-CM | POA: Diagnosis not present

## 2017-12-29 DIAGNOSIS — D649 Anemia, unspecified: Secondary | ICD-10-CM | POA: Diagnosis not present

## 2017-12-29 DIAGNOSIS — C50812 Malignant neoplasm of overlapping sites of left female breast: Secondary | ICD-10-CM

## 2018-01-06 ENCOUNTER — Other Ambulatory Visit: Payer: Self-pay | Admitting: Radiation Therapy

## 2018-01-06 DIAGNOSIS — C7931 Secondary malignant neoplasm of brain: Secondary | ICD-10-CM

## 2018-01-06 DIAGNOSIS — C7949 Secondary malignant neoplasm of other parts of nervous system: Principal | ICD-10-CM

## 2018-01-07 ENCOUNTER — Encounter: Payer: Self-pay | Admitting: Radiation Oncology

## 2018-01-07 ENCOUNTER — Ambulatory Visit
Admission: RE | Admit: 2018-01-07 | Discharge: 2018-01-07 | Disposition: A | Discharge status: Home / Self Care | Payer: Medicaid Other | Source: Ambulatory Visit | Attending: Radiation Oncology | Admitting: Radiation Oncology

## 2018-01-07 ENCOUNTER — Other Ambulatory Visit: Payer: Self-pay

## 2018-01-07 VITALS — BP 122/65 | HR 90 | Temp 98.7°F | Resp 16 | Ht 64.5 in | Wt 200.0 lb

## 2018-01-07 DIAGNOSIS — Z171 Estrogen receptor negative status [ER-]: Secondary | ICD-10-CM | POA: Diagnosis not present

## 2018-01-07 DIAGNOSIS — C7931 Secondary malignant neoplasm of brain: Secondary | ICD-10-CM | POA: Insufficient documentation

## 2018-01-07 DIAGNOSIS — C50912 Malignant neoplasm of unspecified site of left female breast: Secondary | ICD-10-CM | POA: Insufficient documentation

## 2018-01-07 DIAGNOSIS — Z7982 Long term (current) use of aspirin: Secondary | ICD-10-CM | POA: Insufficient documentation

## 2018-01-07 DIAGNOSIS — Z886 Allergy status to analgesic agent status: Secondary | ICD-10-CM | POA: Diagnosis not present

## 2018-01-07 DIAGNOSIS — Z79899 Other long term (current) drug therapy: Secondary | ICD-10-CM | POA: Diagnosis not present

## 2018-01-07 DIAGNOSIS — Z9221 Personal history of antineoplastic chemotherapy: Secondary | ICD-10-CM | POA: Diagnosis not present

## 2018-01-07 DIAGNOSIS — Z7902 Long term (current) use of antithrombotics/antiplatelets: Secondary | ICD-10-CM | POA: Diagnosis not present

## 2018-01-07 HISTORY — DX: Neutropenia, unspecified: R50.81

## 2018-01-07 HISTORY — DX: Candidal stomatitis: B37.0

## 2018-01-07 HISTORY — DX: Neutropenia, unspecified: D70.9

## 2018-01-07 HISTORY — DX: Osteonecrosis, unspecified: M87.9

## 2018-01-07 NOTE — Progress Notes (Signed)
Radiation Oncology         [phone] ________________________________  Name: Diane Short        MRN: [id number]  Date of Service: 01/07/2018 DOB: 11/05/1955  NW:GNFAO, Rayne Du, MD  Derwood Kaplan, MD     REFERRING PHYSICIAN: Derwood Kaplan, MD   DIAGNOSIS: The primary encounter diagnosis was Malignant neoplasm of left breast in female, estrogen receptor negative, unspecified site of breast ([place]). A diagnosis of Brain metastasis ([place]) was also pertinent to this visit.   HISTORY OF PRESENT ILLNESS: Diane Short is a 62 y.o. female seen at the request of Dr. Hinton Rao for newly diagnosed brain metastasis in the setting of a prior history of breast cancer. The patient was originally diagnosed with her breast cancer in 2017 and had weakly ER positive, ER negative, HER2 negative disease who presented with bone metastases in the left iliac bone. She had left axillary nodes and two masses in the left breast. She went on clinical trial with systemic chemotherapy, and underwent left mastectomy and node biopsy in May 2018.  She has not had any prior radiotherapy for her breast cancer.  She also has a history of a right sided aneurysm and had craniotomy approximately 30 years ago.  A CT scan in 2018 revealed concerns for recurrent aneurysm.  After multiple angiograms studies, she has undergone 3 procedures with Dr. Kathyrn Sheriff.  She underwent stent coiling of the right MCA aneurysm in August 2018, arteriogram and pipeline embolization of aneurysm in September 2018, and stent supported coil embolization in November 2018. Her last CT on 11/30/17 revealed stable scattered disease in the iliac bones, right femoral neck, and lumbar spine. She also has stable mediastinal adenopathy, and two nodes minimally enlarged in the mediastinum. She described headache and is currently receiving chemotherapy so she was sent for MRI of the brain on 01/06/18 which revealed a solitary left posterior  frontal metastasis measuring 13 x 15 x 26 mm. Multifocal areas of restricted diffusion in the right hemisphere consistent with her prior embolization versus infarcts. She has a left mastoid effusion and encasement of the left ICA. She comes today for evaluation to discuss radiotherapy to treat her brain disease. Of note, Dr. Kathyrn Sheriff is aware of her current situation and recommends stereotactic radiosurgery [facility]). She was started on Dexamethasone 4 mg daily as of yesterday.     PREVIOUS RADIATION THERAPY: No   PAST MEDICAL HISTORY:  Past Medical History:  Diagnosis Date  . 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 Anxiety   . Arthritis   . Cancer [facility])    breast cancer  . Cerebral hemorrhage ([place]) 1986  . Depression    due to cancer dx  . Diverticulosis   . GERD (gastroesophageal reflux disease)   . Headache   . History of hiatal hernia   . Hypertension   . 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 Neutropenic fever ([place])    Chemo related  . Osteonecrosis ([place]) 2017-2018   Right Jaw bone  . Thrush, oral    Chemo related       PAST SURGICAL HISTORY: Past Surgical History:  Procedure Laterality Date  . BREAST SURGERY Left 11/2016   metastic to bone  . CATARACT EXTRACTION Right 03/02/2017  . CEREBRAL ANGIOGRAM  1986  . CESAREAN SECTION  1980  . IR 3D INDEPENDENT WKST  01/20/2017  . IR 3D INDEPENDENT WKST  05/26/2017  . IR ANGIO INTRA EXTRACRAN SEL COM  CAROTID INNOMINATE UNI R MOD SED  04/09/2017  . IR ANGIO INTRA EXTRACRAN SEL INTERNAL CAROTID BILAT MOD SED  01/20/2017  . IR ANGIO INTRA EXTRACRAN SEL INTERNAL CAROTID BILAT MOD SED  11/17/2017  . IR ANGIO INTRA EXTRACRAN SEL INTERNAL CAROTID UNI R MOD SED  03/13/2017  . IR ANGIO VERTEBRAL SEL VERTEBRAL BILAT MOD SED  01/20/2017  . IR ANGIO VERTEBRAL SEL VERTEBRAL UNI L MOD SED  05/26/2017  . IR ANGIO VERTEBRAL SEL VERTEBRAL UNI L MOD SED  11/17/2017  . IR ANGIOGRAM FOLLOW UP STUDY  03/13/2017  . IR ANGIOGRAM FOLLOW UP STUDY  03/13/2017  . IR ANGIOGRAM  FOLLOW UP STUDY  03/13/2017  . IR ANGIOGRAM FOLLOW UP STUDY  03/13/2017  . IR ANGIOGRAM FOLLOW UP STUDY  03/13/2017  . IR ANGIOGRAM FOLLOW UP STUDY  03/13/2017  . IR ANGIOGRAM FOLLOW UP STUDY  03/13/2017  . IR ANGIOGRAM FOLLOW UP STUDY  03/13/2017  . IR ANGIOGRAM FOLLOW UP STUDY  03/13/2017  . IR ANGIOGRAM FOLLOW UP STUDY  03/13/2017  . IR ANGIOGRAM FOLLOW UP STUDY  03/13/2017  . IR ANGIOGRAM FOLLOW UP STUDY  03/13/2017  . IR ANGIOGRAM FOLLOW UP STUDY  04/09/2017  . IR ANGIOGRAM FOLLOW UP STUDY  05/26/2017  . IR ANGIOGRAM FOLLOW UP STUDY  05/26/2017  . IR NEURO EACH ADD'L AFTER BASIC UNI LEFT (MS)  04/09/2017  . IR NEURO EACH ADD'L AFTER BASIC UNI RIGHT (MS)  03/13/2017  . IR TRANSCATH/EMBOLIZ  03/13/2017  . IR TRANSCATH/EMBOLIZ  04/09/2017  . IR TRANSCATH/EMBOLIZ  05/26/2017  . IR US GUIDE VASC ACCESS RIGHT  04/09/2017  . MASTECTOMY Left   . port a cathh  04/2016  . RADIOLOGY WITH ANESTHESIA N/A 03/13/2017   Procedure: stent coiling of right MCA aneurysm;  Surgeon: Consuella Lose, MD;  Location: [place];  Service: Radiology;  Laterality: N/A;  . RADIOLOGY WITH ANESTHESIA N/A 04/09/2017   Procedure: Arteriogram, pipeline embolization of aneruysm;  Surgeon: Consuella Lose, MD;  Location: [place];  Service: Radiology;  Laterality: N/A;  . RADIOLOGY WITH ANESTHESIA N/A 05/26/2017   Procedure: Stent supported coil embolization;  Surgeon: Consuella Lose, MD;  Location: [place];  Service: Radiology;  Laterality: N/A;  . TUBAL LIGATION  1983     FAMILY HISTORY:  Family History  Problem Relation Age of Onset  . Alzheimer's disease Mother   . Colon cancer Father   . Cerebral aneurysm Father   . Diabetes Sister   . Stomach cancer Brother   . Esophageal cancer Brother   . Multiple myeloma Brother   . Hypertension Sister      SOCIAL HISTORY:  reports that she has been smoking cigarettes.  She has a 21.00 pack-year smoking history. She has never used smokeless tobacco. She reports that she does  not drink alcohol or use drugs. The patient is married and lives in [place]. 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   ALLERGIES: Nsaids and Ace inhibitors   MEDICATIONS:  Current Outpatient Medications  Medication Sig Dispense Refill  . acetaminophen (TYLENOL) 500 MG tablet Take 1,000 mg by mouth 2 (two) times daily as needed for mild pain.     Marland Kitchen aspirin 81 MG tablet Take 81 mg by mouth daily.     . Calcium Citrate-Vitamin D (CALCIUM CITRATE + D3 PO) Take 1-2 tablets by mouth 2 (two) times daily. Takes 2 in the am and 1 in the evening    . carvedilol (COREG) 25 MG tablet Take  25 mg by mouth 2 (two) times daily with a meal.    . citalopram (CELEXA) 20 MG tablet Take 20 mg by mouth daily.    Marland Kitchen dexamethasone (DECADRON) 4 MG tablet Take 4 mg by mouth daily.     . diphenhydrAMINE (BENADRYL) 25 MG tablet Take 25 mg by mouth at bedtime as needed for sleep.    Marland Kitchen loperamide (IMODIUM A-D) 2 MG tablet Take 2-4 mg by mouth daily as needed for diarrhea or loose stools.    Marland Kitchen LORazepam (ATIVAN) 0.5 MG tablet Take 0.5 mg by mouth every 4 (four) hours as needed for anxiety.    Marland Kitchen omeprazole (PRILOSEC) 20 MG capsule Take 20 mg by mouth daily.    . ondansetron (ZOFRAN) 4 MG tablet Take 4 mg by mouth every 4 (four) hours as needed for nausea or vomiting.    . prochlorperazine (COMPAZINE) 10 MG tablet Take 10 mg by mouth every 6 (six) hours as needed for nausea or vomiting.    . clopidogrel (PLAVIX) 75 MG tablet Take 75 mg by mouth daily.    Marland Kitchen PACLitaxel (TAXOL) 300 MG/50ML injection Inject into the vein See admin instructions. Inject into the vein. Takes once a week for 3 weeks then off for a week     No current facility-administered medications for this encounter.      REVIEW OF SYSTEMS: On review of systems, the patient reports that she is doing well overall. She has had a dull left sided headache for several weeks, perhaps even months in retrospect. She denies any visual or auditory changes. She is not  having any weakness or dizziness. She denies any chest pain, shortness of breath, cough, fevers, chills, night sweats, unintended weight changes. She denies any bowel or bladder disturbances, and denies abdominal pain, nausea or vomiting. She denies any new musculoskeletal or joint aches or pains. A complete review of systems is obtained and is otherwise negative.     PHYSICAL EXAM:  Wt Readings from Last 3 Encounters:  01/07/18 200 lb (90.7 kg)  11/17/17 200 lb (90.7 kg)  05/26/17 195 lb (88.5 kg)   Temp Readings from Last 3 Encounters:  01/07/18 98.7 F (37.1 C) (Oral)  11/17/17 98.8 F (37.1 C)  05/27/17 97.9 F (36.6 C) (Axillary)   BP Readings from Last 3 Encounters:  01/07/18 122/65  11/17/17 123/71  05/27/17 (!) 121/97   Pulse Readings from Last 3 Encounters:  01/07/18 90  11/17/17 84  05/27/17 82   Pain Assessment Pain Score: 1 (barely a 1, mild headache, just enought to know its there.)/10  In general this is a well appearing caucasian female in no acute distress. She's alert and oriented x4 and appropriate throughout the examination. Cardiopulmonary assessment is negative for acute distress and she exhibits normal effort. HEENT reveals that she is normocephalic atraumatic EOMs are intact.  No focal neurologic deficits are appreciated today.    ECOG = 1  0 - Asymptomatic (Fully active, able to carry on all predisease activities without restriction)  1 - Symptomatic but completely ambulatory (Restricted in physically strenuous activity but ambulatory and able to carry out work of a light or sedentary nature. For example, light housework, office work)  2 - Symptomatic, <50% in bed during the day (Ambulatory and capable of all self care but unable to carry out any work activities. Up and about more than 50% of waking hours)  3 - Symptomatic, >50% in bed, but not bedbound (Capable of only limited self-care,  confined to bed or chair 50% or more of waking hours)  4 -  Bedbound (Completely disabled. Cannot carry on any self-care. Totally confined to bed or chair)  5 - Death   Eustace Pen MM, Creech RH, Tormey DC, et al. [phone]). "Toxicity and response criteria of the [facility]". Will Oncol. 5 (6): 649-55    LABORATORY DATA:  Lab Results  Component Value Date   WBC 2.0 (L) 11/17/2017   HGB 10.3 (L) 11/17/2017   HCT 32.8 (L) 11/17/2017   MCV 100.0 11/17/2017   PLT 280 11/17/2017   Lab Results  Component Value Date   NA 141 11/17/2017   K 4.8 11/17/2017   CL 108 11/17/2017   CO2 25 11/17/2017   No results found for: ALT, AST, GGT, ALKPHOS, BILITOT    RADIOGRAPHY: No results found.     IMPRESSION/PLAN: 1. 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 Dr. Lisbeth Renshaw discusses the pathology findings and reviews the nature of metastatic disease to the brain.  Given the solitary nature of the findings on her MRI studies, we believe that she would be a candidate for stereotactic radiosurgery.  She does need to undergo additional MRI and we have checked that her stents and coils are compatible with 3T MRI.  We will follow-up with these results to confirm that she is still a candidate.  We also discussed the options to also consider whole brain radiotherapy though at this point it appears she would be a better candidate for targeted therapy to reduce toxicity that whole brain could expose her to.  We discussed the risks, benefits, short, and long term effects of radiotherapy, and the patient is interested in proceeding. Dr. Lisbeth Renshaw discusses the delivery and logistics of radiotherapy and anticipates single fraction of stereotactic radiosurgery possibly with open face mask trial. We outlined the rationale for close surveillance per NCCN guidelines as well.  She is in agreement with this plan.  She will continue to follow-up with Dr. Hinton Rao in [place] for her systemic management.   Regarding steroids, she will continue Dexamethasone at 4 mg daily. 2. GI Prophylaxis. The patient will continue her Dexamethasone as outlined above, and takes prilosec. She's been encouraged to continue this. 3. History of Recurrent MCA aneurysm. The patient has undergone stent coiling in 2018, pipeline embolization, and stent supported coil embolization on each occasion with Dr. Kathyrn Sheriff. She will continue her routine follow up with him and he will be an integral part of her case as we move forward with her SRS treatment plan.  In a visit lasting 60 minutes, greater than 50% of the time was spent face to face discussing her case, and coordinating the patient's care.  The above documentation reflects my direct findings during this shared patient visit. Please see the separate note by Dr. Lisbeth Renshaw on this date for the remainder of the patient's plan of care.    Carola Rhine, PAC

## 2018-01-07 NOTE — Progress Notes (Signed)
Location/Histology of Brain Tumor: Secondary malignant neoplasm of brain and spinal cord.  Primary Breast cancer with mets to brain and bone. Sept 2017 Breast: - Malignant neoplasm of overlapping sites of left female breast- Triple negative, stage 4- mets to bone.- weakly ER +, Her2 -  -Secondary malignant neoplasm of axillary lymph nodes. 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  Started taxol after mastectomy.  Started having blurred vision, went to eye dr. Prescription changed, found cataract.  Had removed.  Resolved for a while, started again.  Went for scan- showed aneurysm.  Patient presented with symptoms of: Headaches last Saturday.  Cerebral Angiogram 11/17/2017  1. 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 2. Continued flow remodeling of a supraclinoid left internal carotid artery aneurysm 6 months after pipeline embolization. There is minimal aneurysm filling. There is no in stent stenosis. 3. Small aneurysm residual measuring 2.3 x 2.8 mm of the previously coiled left PICA aneurysm. 4. 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  MRI Head 01/06/2018 -Solitary left posterior frontal metastasis, 13 x 15 x 26 mm. -Multifocal areas of restricted diffusion, right hemisphere, which could represent watershed infarcts versus a shower of emboli. -Suspected partial recanalization of the treated right MCA saccular aneurysm.  Repeat catheter angiogram is likely necessary to further assess. -Marrow replacement in the clivus, consistent with metastatic breast cancer.  Encasement of the left ICA.  Left mastoid effusion, possible eustacian tube dysfunction due to  tumor. -aneurysm is filling back up, the one that was gone on 11/2017 scan.  MRI Brain scheduled for 01/10/2018  Past or anticipated interventions, if any, per neurosurgery:  Dr. Kathyrn Sheriff 11/17/17 -Stent supported coiling of a right MCA aneurysm 8/18 -Pipeline embolization of a LICA aneurysm in 5/05 -Coiling of a LPICA aneurysm in 11/18  Between repairs she had cataracts removed.  Total of 4 aneurysms Dr. 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  Changed to [place] and gemzar due to neuropathy with taxol. Dose of Decadron, if applicable: 4 mg daily  Recent neurologic symptoms, if any:   Seizures:  No  Headaches: mild headache 2/10, constant  Nausea: No  Dizziness/ataxia: lightheaded a couple of time upon standing.  Difficulty with hand coordination: No, fine motor skills takes maybe a couple tries.  Focal numbness/weakness: Left arm lasted 2-3 minutes last week became numb.  Felt like when you hit funny bone.  Visual deficits/changes: No, improved when cataracts removed.  Confusion/Memory deficits: No confusion, noticed some memory loss.  Painful bone metastases at present, if any: Right hip girdle  BP 122/65 (BP Location: Right Arm, Patient Position: Sitting, Cuff Size: Normal)   Pulse 90   Temp 98.7 F (37.1 C) (Oral)   Resp 16   Ht 5' 4.5" (1.638 m)   Wt 200 lb (90.7 kg)   LMP 08/14/2010 (Exact Date)   SpO2 100%   BMI 33.80 kg/m    Wt Readings from Last 3 Encounters:  01/07/18 200 lb (90.7 kg)  11/17/17 200 lb (90.7 kg)  05/26/17 195 lb (88.5 kg)    SAFETY ISSUES:  Prior radiation? No  Pacemaker/ICD? No  Possible current pregnancy?  Tubal Ligation  Is the patient on methotrexate?  No  Additional Complaints / other details: History of bone and breast cancer.  Port placed 04/2016- Right Chest Left Mastectomy- Bone Metastasis- hip  girdle

## 2018-01-10 ENCOUNTER — Other Ambulatory Visit: Payer: Medicaid Other

## 2018-01-13 ENCOUNTER — Other Ambulatory Visit: Payer: Self-pay | Admitting: *Deleted

## 2018-01-13 ENCOUNTER — Ambulatory Visit
Admission: RE | Admit: 2018-01-13 | Discharge: 2018-01-13 | Disposition: A | Discharge status: Home / Self Care | Payer: Medicaid Other | Source: Ambulatory Visit | Attending: Radiation Oncology | Admitting: Radiation Oncology

## 2018-01-13 ENCOUNTER — Ambulatory Visit: Payer: Medicaid Other

## 2018-01-13 VITALS — BP 140/73 | HR 67 | Temp 98.8°F | Resp 20 | Wt 198.0 lb

## 2018-01-13 DIAGNOSIS — Z510 Encounter for antineoplastic radiation therapy: Secondary | ICD-10-CM | POA: Diagnosis present

## 2018-01-13 DIAGNOSIS — C50912 Malignant neoplasm of unspecified site of left female breast: Secondary | ICD-10-CM | POA: Diagnosis not present

## 2018-01-13 DIAGNOSIS — C7931 Secondary malignant neoplasm of brain: Secondary | ICD-10-CM | POA: Diagnosis not present

## 2018-01-13 DIAGNOSIS — Z171 Estrogen receptor negative status [ER-]: Secondary | ICD-10-CM | POA: Diagnosis not present

## 2018-01-13 LAB — BUN & CREATININE (CHCC)
BUN: 14 mg/dL (ref 6–20)
Creatinine: 0.82 mg/dL (ref 0.44–1.00)
GFR, Estimated: >60 mL/min (ref >60)

## 2018-01-13 MED ORDER — HEPARIN SOD (PORK) LOCK FLUSH 100 UNIT/ML IV SOLN
500.0000 [IU] | Freq: Once | INTRAVENOUS | Status: AC
  Administered 2018-01-13: 500 [IU] via INTRAVENOUS

## 2018-01-13 MED ORDER — SODIUM CHLORIDE 0.9% FLUSH
10.0000 mL | Freq: Once | INTRAVENOUS | Status: AC
  Administered 2018-01-13: 10 mL via INTRAVENOUS

## 2018-01-13 NOTE — Progress Notes (Signed)
Has armband been applied?  Yes  Does patient have an allergy to IV contrast dye?: No   Has patient ever received premedication for IV contrast dye?: No  Does patient take metformin?: No  If patient does take metformin when was the last dose: N/A  Date of lab work: 01/13/2018 BUN: 14 CR: 0.82 EGfr: >60  IV site: Port accessed  Has IV site been added to flowsheet?  Yes

## 2018-01-16 ENCOUNTER — Ambulatory Visit
Admission: RE | Admit: 2018-01-16 | Discharge: 2018-01-16 | Disposition: A | Discharge status: Home / Self Care | Payer: Medicaid Other | Source: Ambulatory Visit | Attending: Radiation Oncology | Admitting: Radiation Oncology

## 2018-01-16 ENCOUNTER — Other Ambulatory Visit: Payer: Medicaid Other

## 2018-01-16 DIAGNOSIS — C7931 Secondary malignant neoplasm of brain: Secondary | ICD-10-CM

## 2018-01-16 DIAGNOSIS — C7949 Secondary malignant neoplasm of other parts of nervous system: Principal | ICD-10-CM

## 2018-01-16 MED ORDER — GADOBENATE DIMEGLUMINE 529 MG/ML IV SOLN
19.0000 mL | Freq: Once | INTRAVENOUS | Status: AC | PRN
  Administered 2018-01-16: 19 mL via INTRAVENOUS

## 2018-01-19 ENCOUNTER — Ambulatory Visit: Payer: Medicaid Other | Admitting: Radiation Oncology

## 2018-01-19 DIAGNOSIS — Z510 Encounter for antineoplastic radiation therapy: Secondary | ICD-10-CM | POA: Diagnosis not present

## 2018-01-21 ENCOUNTER — Encounter: Payer: Self-pay | Admitting: Radiation Oncology

## 2018-01-21 ENCOUNTER — Ambulatory Visit
Admission: RE | Admit: 2018-01-21 | Discharge: 2018-01-21 | Disposition: A | Discharge status: Home / Self Care | Payer: Medicaid Other | Source: Ambulatory Visit | Attending: Radiation Oncology | Admitting: Radiation Oncology

## 2018-01-21 VITALS — BP 149/74 | HR 70 | Temp 98.0°F | Resp 18

## 2018-01-21 DIAGNOSIS — C7931 Secondary malignant neoplasm of brain: Secondary | ICD-10-CM

## 2018-01-21 DIAGNOSIS — Z510 Encounter for antineoplastic radiation therapy: Secondary | ICD-10-CM | POA: Diagnosis not present

## 2018-01-21 NOTE — Progress Notes (Signed)
[place] arrived with Mont Dutton ambulatory post [facility] to brain x one. 0858 BP (!) 152/70 (BP Location: Right Arm, Patient Position: Sitting, Cuff Size: Normal)   Pulse 60   Temp 98.2 F (36.8 C) (Oral)   Resp 18   LMP 08/14/2010 (Exact Date)   SpO2 100%  Monitor for  30 minutes alert and oriented x 3, denies headache, dizziness or any visual problems  fatigue or pain.  Knows to call for any unusual symptoms, increased head ache that won't go away, increased fever > 100.5, increased vomiting, vision changes.  Understands to avoid strenuous activity for the next 24 hours and call [phone] with needs.   One month follow up appointment card given with instructions by Mont Dutton, RPT [phone] BP (!) 149/74 (BP Location: Right Arm, Patient Position: Sitting, Cuff Size: Normal)   Pulse 70   Temp 98 F (36.7 C) (Oral)   Resp 18   LMP 08/14/2010 (Exact Date)   SpO2 100%   0927 Discharged home with husband and grandson  ambulatory upstairs to waiting area for the car.  Denies headache, dizziness or any visual problems  fatigue or pain.

## 2018-01-23 NOTE — Progress Notes (Signed)
  Radiation Oncology         [phone]) [phone] ________________________________  Name: Diane Short MRN: [id number]  Date: 01/21/2018  DOB: [birth year]   SPECIAL TREATMENT PROCEDURE   3D TREATMENT PLANNING AND DOSIMETRY: The patient's radiation plan was reviewed and approved by Dr. Elenor Legato from neurosurgery and radiation oncology prior to treatment. It showed 3-dimensional radiation distributions overlaid onto the planning CT/MRI image set. [facility] for the target structures as well as the organs at risk were reviewed. The documentation of the 3D plan and dosimetry are filed in the radiation oncology EMR.   NARRATIVE: The patient was brought to the TrueBeam stereotactic radiation treatment machine and placed supine on the CT couch. The head frame was applied, and the patient was set up for stereotactic radiosurgery. Neurosurgery was present for the set-up and delivery   SIMULATION VERIFICATION: In the couch zero-angle position, the patient underwent Exactrac imaging using the Brainlab system with orthogonal KV images. These were carefully aligned and repeated to confirm treatment position for each of the isocenters. The Exactrac snap film verification was repeated at each couch angle.   SPECIAL TREATMENT PROCEDURE: The patient received stereotactic radiosurgery to the following target:  PTV1 target was treated using 3 Arcs to a prescription dose of 18 Gy. ExacTrac Snap verification was performed for each couch angle.   STEREOTACTIC TREATMENT MANAGEMENT: Following delivery, the patient was transported to nursing in stable condition and monitored for possible acute effects. Vital signs were recorded . The patient tolerated treatment without significant acute effects, and was discharged to home in stable condition.  PLAN: Follow-up in one month.   ------------------------------------------------  Jodelle Gross, MD, PhD

## 2018-01-28 NOTE — Addendum Note (Signed)
Encounter addended by: Consuella Lose, MD on: 01/28/2018 11:31 AM  Actions taken: Sign clinical note

## 2018-01-28 NOTE — Op Note (Signed)
  STEREOTACTIC RADIOSURGERY OPERATIVE NOTE   PRE-OPERATIVE DIAGNOSIS:  Metastatic Breast CA  POST-OPERATIVE DIAGNOSIS:  Same  PROCEDURE:  Stereotactic Radiosurgery  SURGEON:  Consuella Lose, MD  RADIATION ONCOLOGIST: Dr. Kyung Rudd, MD  TECHNIQUE:  The patient underwent a radiation treatment planning session in the radiation oncology simulation suite under the care of the radiation oncology physician and physicist.  I participated closely in the radiation treatment planning afterwards. The patient underwent planning CT which was fused to 3T high resolution MRI with 1 mm axial slices.  These images were fused on the planning system.  We contoured the gross target volumes and subsequently expanded this to yield the Planning Target Volume. I actively participated in the planning process.  I helped to define and review the target contours and also the contours of the optic pathway, eyes, brainstem and selected nearby organs at risk.  All the dose constraints for critical structures were reviewed and compared to AAPM Task Group 101.  The prescription dose conformity was reviewed.  I approved the plan electronically.    Accordingly, Diane Short  was brought to the TrueBeam stereotactic radiation treatment linac and placed in the custom immobilization mask.  The patient was aligned according to the IR fiducial markers with BrainLab Exactrac, then orthogonal x-rays were used in ExacTrac with the 6DOF robotic table and the shifts were made to align the patient  Diane Short received stereotactic radiosurgery to a prescription dose of 18Gy to left frontal lesion uneventfully.    The detailed description of the procedure is recorded in the radiation oncology procedure note.  I was present for the duration of the procedure.  DISPOSITION:   Following delivery, the patient was transported to nursing in stable condition and monitored for possible acute effects to be discharged to home in  stable condition with follow-up in one month.  Consuella Lose, MD [facility]

## 2018-02-03 NOTE — Progress Notes (Signed)
  Radiation Oncology         [phone] ________________________________  Name: Diane Short MRN: [id number]  Date: 01/21/2018  DOB: Oct 29, 1955  End of Treatment Note  Diagnosis:   62 y.o. 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  There were no signs of acute toxicity after treatment.  Plan: The patient has completed radiation treatment. The patient will return to radiation oncology clinic for routine followup in one month. I advised the patient to call or return sooner if they have any questions or concerns related to their recovery or treatment. ________________________________  Jodelle Gross, MD, PhD  This document serves as a record of services personally performed by Kyung Rudd, MD. It was created on his behalf by Rae Lips, a trained medical scribe. The creation of this record is based on the scribe's personal observations and the provider's statements to them. This document has been checked and approved by the attending provider.

## 2018-02-22 ENCOUNTER — Other Ambulatory Visit: Payer: Self-pay

## 2018-02-22 ENCOUNTER — Ambulatory Visit
Admission: RE | Admit: 2018-02-22 | Discharge: 2018-02-22 | Disposition: A | Discharge status: Home / Self Care | Payer: Medicaid Other | Source: Ambulatory Visit | Attending: Radiation Oncology | Admitting: Radiation Oncology

## 2018-02-22 ENCOUNTER — Ambulatory Visit: Payer: Medicaid Other

## 2018-02-22 ENCOUNTER — Encounter: Payer: Self-pay | Admitting: Radiation Oncology

## 2018-02-22 DIAGNOSIS — I671 Cerebral aneurysm, nonruptured: Secondary | ICD-10-CM | POA: Diagnosis not present

## 2018-02-22 DIAGNOSIS — Z886 Allergy status to analgesic agent status: Secondary | ICD-10-CM | POA: Diagnosis not present

## 2018-02-22 DIAGNOSIS — C50912 Malignant neoplasm of unspecified site of left female breast: Secondary | ICD-10-CM | POA: Diagnosis not present

## 2018-02-22 DIAGNOSIS — Z171 Estrogen receptor negative status [ER-]: Secondary | ICD-10-CM | POA: Insufficient documentation

## 2018-02-22 DIAGNOSIS — Z79899 Other long term (current) drug therapy: Secondary | ICD-10-CM | POA: Insufficient documentation

## 2018-02-22 DIAGNOSIS — Z7982 Long term (current) use of aspirin: Secondary | ICD-10-CM | POA: Insufficient documentation

## 2018-02-22 DIAGNOSIS — C7931 Secondary malignant neoplasm of brain: Secondary | ICD-10-CM | POA: Insufficient documentation

## 2018-02-22 NOTE — Addendum Note (Signed)
Encounter addended by: Malena Edman, RN on: 02/22/2018 1:16 PM  Actions taken: Charge Capture section accepted

## 2018-02-22 NOTE — Progress Notes (Signed)
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  The patient tolerated her procedure well, she was given taper instructions for  dexamethasone following this but developed some rebound symptoms of headache and is taking 4 mg daily.                      On review of systems, the patient states she has had several episodes of headaches while trying to taper to 2 mg. She is back to 4 mg of Dexamethasone daily. She is having trouble with waking up at night and falling back asleep. She's taking benadryl and ativan, which has not significantly changed, but a change in position and to another room allows her to fall back asleep. She denies any concerns with nausea. Her headache is described as throbbing in a band like presentation and she does not have a history of headaches prior to her brain disease being diagnosed. She does have post nasal drip as a result of her current chemo regimen. No other complaints are noted.  ALLERGIES:  is allergic to nsaids and ace inhibitors.  Meds: Current Outpatient Medications  Medication Sig Dispense Refill  . acetaminophen (TYLENOL) 500 MG tablet Take 1,000 mg by mouth 2 (two) times daily as needed for mild pain.     Marland Kitchen aspirin 81 MG tablet Take 81 mg by mouth daily.     . Calcium Citrate-Vitamin D (CALCIUM CITRATE + D3 PO) Take 1-2 tablets by mouth 2 (two) times daily. Takes 2 in the am and 1 in the evening    . carvedilol (COREG) 25 MG tablet Take 25 mg by  mouth 2 (two) times daily with a meal.    . citalopram (CELEXA) 20 MG tablet Take 20 mg by mouth daily.    Marland Kitchen dexamethasone (DECADRON) 4 MG tablet Take 4 mg by mouth daily.     Marland Kitchen LORazepam (ATIVAN) 0.5 MG tablet Take 0.5 mg by mouth every 4 (four) hours as needed for anxiety.    Marland Kitchen omeprazole (PRILOSEC) 20 MG capsule Take 20 mg by mouth daily.    . diphenhydrAMINE (BENADRYL) 25 MG tablet Take 25 mg by mouth at bedtime as needed for sleep.    Marland Kitchen loperamide (IMODIUM A-D) 2 MG tablet Take 2-4 mg by mouth daily as needed for diarrhea or loose stools.    . ondansetron (ZOFRAN) 4 MG tablet Take 4 mg by mouth every 4 (four) hours as needed for nausea or vomiting.    . prochlorperazine (COMPAZINE) 10 MG tablet Take 10 mg by mouth every 6 (six) hours as needed for nausea or vomiting.     No current facility-administered medications for this encounter.     Physical Findings:  height is 5' 4.5" (1.638 m) (pended) and weight is 210 lb 12.8 oz (95.6 kg) (pended). Her oral temperature is 98.3 F (36.8 C) (pended). Her blood pressure is 163/81 (abnormal, pended) and her pulse is 79 (pended). Her respiration is  18 (pended) and oxygen saturation is 100% (pended).  Pain Assessment Pain Score: (P) 1  Pain Frequency: (P) Several days a week Pain Loc: (P) Back/10 In general this is a well appearing caucasian female in no acute distress. She's alert and oriented x4 and appropriate throughout the examination. Cardiopulmonary assessment is negative for acute distress and she exhibits normal effort. She appears to be neurologically intact without focal changes.  Lab Findings: Lab Results  Component Value Date   WBC 2.0 (L) 11/17/2017   HGB 10.3 (L) 11/17/2017   HCT 32.8 (L) 11/17/2017   MCV 100.0 11/17/2017   PLT 280 11/17/2017     Radiographic Findings: No results found.  Impression/Plan: 1. 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   The patient appears to be recovering from the effects of radiotherapy.  We plan to follow her in her in our multidisciplinary brain and spine oncology conference in clinic.  She will be due for her first posttreatment scan with an MRI in approximately  2 months time.  She will also follow-up with Dr. Hinton Rao and continues on Gemzar. I will be happy to review the results with her by phone for her next scan, and then see her every 3 months subsequently after MRI scans.  We did review NCCN guidelines for the frequency of imaging. We discussed trying again to taper her Dexamethasone and reviewed the need to continue PPI therapy and risks of long term steroid use. She is in agreement to go to 2 mg Dexamethasone daily until 8/19 then 2 mg every other day and end on 8/28. She will notify us of headaches or other symptoms during her taper. 2. History of recurrent MCA aneurysm.  The patient continues to follow expectantly with Dr. Kathyrn Sheriff.  As he participates in brain oncology conference, we will confirm with him whether she needs to have angiography at the time of her MRI scans for following her oncology course. 3. Sleep disruptions. I anticipate that this is multifactorial but hopefully her symptoms will improve after tapering her steroid. 4. PAC. The patient has central IV access and wishes for her PAC to be used for MRI related Contrast. We will coordinate this.     Carola Rhine, PAC

## 2018-02-23 ENCOUNTER — Encounter: Payer: Self-pay | Admitting: Radiation Oncology

## 2018-02-26 DIAGNOSIS — D649 Anemia, unspecified: Secondary | ICD-10-CM | POA: Diagnosis not present

## 2018-02-26 DIAGNOSIS — C771 Secondary and unspecified malignant neoplasm of intrathoracic lymph nodes: Secondary | ICD-10-CM | POA: Diagnosis not present

## 2018-02-26 DIAGNOSIS — Z7982 Long term (current) use of aspirin: Secondary | ICD-10-CM

## 2018-02-26 DIAGNOSIS — Z923 Personal history of irradiation: Secondary | ICD-10-CM

## 2018-02-26 DIAGNOSIS — R600 Localized edema: Secondary | ICD-10-CM

## 2018-02-26 DIAGNOSIS — D701 Agranulocytosis secondary to cancer chemotherapy: Secondary | ICD-10-CM

## 2018-02-26 DIAGNOSIS — R55 Syncope and collapse: Secondary | ICD-10-CM

## 2018-02-26 DIAGNOSIS — D709 Neutropenia, unspecified: Secondary | ICD-10-CM

## 2018-02-26 DIAGNOSIS — C7931 Secondary malignant neoplasm of brain: Secondary | ICD-10-CM

## 2018-02-26 DIAGNOSIS — Z79899 Other long term (current) drug therapy: Secondary | ICD-10-CM

## 2018-02-26 DIAGNOSIS — C50812 Malignant neoplasm of overlapping sites of left female breast: Secondary | ICD-10-CM

## 2018-02-26 DIAGNOSIS — C7951 Secondary malignant neoplasm of bone: Secondary | ICD-10-CM | POA: Diagnosis not present

## 2018-03-04 NOTE — Progress Notes (Addendum)
  Radiation Oncology         [phone] ________________________________  Name: Diane Short MRN: [id number]  Date: 01/13/2018  DOB: 03-11-1956  DIAGNOSIS:     ICD-10-CM   1. Brain metastasis ([place]) C79.31     NARRATIVE:  The patient was brought to the [facility].  Identity was confirmed.  All relevant records and images related to the planned course of therapy were reviewed.  The patient freely provided informed written consent to proceed with treatment after reviewing the details related to the planned course of therapy. The consent form was witnessed and verified by the simulation staff. Intravenous access was established for contrast administration. Then, the patient was set-up in a stable reproducible supine position for radiation therapy.  A relocatable thermoplastic stereotactic head frame was fabricated for precise immobilization.  CT images were obtained.  Surface markings were placed.  The CT images were loaded into the planning software and fused with the patient's targeting MRI scan.  Then the target and avoidance structures were contoured.  Treatment planning then occurred.  The radiation prescription was entered and confirmed.  I have requested 3D planning  I have requested a DVH of the following structures: Brain stem, brain, left eye, right eye, lenses, optic chiasm, target volumes, uninvolved brain, and normal tissue.    SPECIAL TREATMENT PROCEDURE:  The planned course of therapy using radiation constitutes a special treatment procedure. Special care is required in the management of this patient for the following reasons. This treatment constitutes a Special Treatment Procedure for the following reason: High dose per fraction requiring special monitoring for increased toxicities of treatment including daily imaging.  The special nature of the planned course of radiotherapy will require increased physician supervision and oversight to ensure patient's safety with  optimal treatment outcomes.  PLAN:  The patient will receive 18 Gy in 1 fraction.   ------------------------------------------------  Jodelle Gross, MD, PhD

## 2018-03-22 ENCOUNTER — Inpatient Hospital Stay (HOSPITAL_COMMUNITY)
Admission: EM | Admit: 2018-03-22 | Discharge: 2018-04-16 | DRG: 020 | Disposition: A | Discharge status: Rehabilitation Facility | Payer: Medicaid Other | Source: Other Acute Inpatient Hospital | Attending: Internal Medicine | Admitting: Internal Medicine

## 2018-03-22 ENCOUNTER — Emergency Department (HOSPITAL_COMMUNITY): Payer: Medicaid Other

## 2018-03-22 ENCOUNTER — Inpatient Hospital Stay (HOSPITAL_COMMUNITY): Payer: Medicaid Other

## 2018-03-22 ENCOUNTER — Encounter (HOSPITAL_COMMUNITY): Payer: Self-pay

## 2018-03-22 DIAGNOSIS — C7931 Secondary malignant neoplasm of brain: Secondary | ICD-10-CM | POA: Diagnosis present

## 2018-03-22 DIAGNOSIS — C50912 Malignant neoplasm of unspecified site of left female breast: Secondary | ICD-10-CM | POA: Diagnosis present

## 2018-03-22 DIAGNOSIS — S60221A Contusion of right hand, initial encounter: Secondary | ICD-10-CM | POA: Diagnosis not present

## 2018-03-22 DIAGNOSIS — Z9289 Personal history of other medical treatment: Secondary | ICD-10-CM | POA: Diagnosis not present

## 2018-03-22 DIAGNOSIS — I10 Essential (primary) hypertension: Secondary | ICD-10-CM | POA: Diagnosis present

## 2018-03-22 DIAGNOSIS — W19XXXA Unspecified fall, initial encounter: Secondary | ICD-10-CM | POA: Diagnosis present

## 2018-03-22 DIAGNOSIS — R569 Unspecified convulsions: Secondary | ICD-10-CM | POA: Diagnosis not present

## 2018-03-22 DIAGNOSIS — R1310 Dysphagia, unspecified: Secondary | ICD-10-CM | POA: Diagnosis not present

## 2018-03-22 DIAGNOSIS — Z8249 Family history of ischemic heart disease and other diseases of the circulatory system: Secondary | ICD-10-CM

## 2018-03-22 DIAGNOSIS — Z9012 Acquired absence of left breast and nipple: Secondary | ICD-10-CM

## 2018-03-22 DIAGNOSIS — N390 Urinary tract infection, site not specified: Secondary | ICD-10-CM | POA: Diagnosis present

## 2018-03-22 DIAGNOSIS — G8191 Hemiplegia, unspecified affecting right dominant side: Secondary | ICD-10-CM | POA: Diagnosis present

## 2018-03-22 DIAGNOSIS — R410 Disorientation, unspecified: Secondary | ICD-10-CM | POA: Diagnosis not present

## 2018-03-22 DIAGNOSIS — K626 Ulcer of anus and rectum: Secondary | ICD-10-CM | POA: Diagnosis not present

## 2018-03-22 DIAGNOSIS — E876 Hypokalemia: Secondary | ICD-10-CM | POA: Diagnosis not present

## 2018-03-22 DIAGNOSIS — Z171 Estrogen receptor negative status [ER-]: Secondary | ICD-10-CM

## 2018-03-22 DIAGNOSIS — J189 Pneumonia, unspecified organism: Secondary | ICD-10-CM

## 2018-03-22 DIAGNOSIS — E874 Mixed disorder of acid-base balance: Secondary | ICD-10-CM | POA: Diagnosis not present

## 2018-03-22 DIAGNOSIS — R0989 Other specified symptoms and signs involving the circulatory and respiratory systems: Secondary | ICD-10-CM | POA: Diagnosis not present

## 2018-03-22 DIAGNOSIS — G910 Communicating hydrocephalus: Secondary | ICD-10-CM | POA: Diagnosis present

## 2018-03-22 DIAGNOSIS — Z7189 Other specified counseling: Secondary | ICD-10-CM

## 2018-03-22 DIAGNOSIS — F17210 Nicotine dependence, cigarettes, uncomplicated: Secondary | ICD-10-CM | POA: Diagnosis present

## 2018-03-22 DIAGNOSIS — J9602 Acute respiratory failure with hypercapnia: Secondary | ICD-10-CM | POA: Diagnosis not present

## 2018-03-22 DIAGNOSIS — I609 Nontraumatic subarachnoid hemorrhage, unspecified: Secondary | ICD-10-CM | POA: Diagnosis not present

## 2018-03-22 DIAGNOSIS — J90 Pleural effusion, not elsewhere classified: Secondary | ICD-10-CM | POA: Diagnosis not present

## 2018-03-22 DIAGNOSIS — Z888 Allergy status to other drugs, medicaments and biological substances status: Secondary | ICD-10-CM

## 2018-03-22 DIAGNOSIS — R0600 Dyspnea, unspecified: Secondary | ICD-10-CM | POA: Diagnosis not present

## 2018-03-22 DIAGNOSIS — E870 Hyperosmolality and hypernatremia: Secondary | ICD-10-CM | POA: Diagnosis not present

## 2018-03-22 DIAGNOSIS — R5381 Other malaise: Secondary | ICD-10-CM | POA: Diagnosis not present

## 2018-03-22 DIAGNOSIS — Z807 Family history of other malignant neoplasms of lymphoid, hematopoietic and related tissues: Secondary | ICD-10-CM

## 2018-03-22 DIAGNOSIS — R627 Adult failure to thrive: Secondary | ICD-10-CM | POA: Diagnosis not present

## 2018-03-22 DIAGNOSIS — Z978 Presence of other specified devices: Secondary | ICD-10-CM

## 2018-03-22 DIAGNOSIS — J9600 Acute respiratory failure, unspecified whether with hypoxia or hypercapnia: Secondary | ICD-10-CM | POA: Diagnosis not present

## 2018-03-22 DIAGNOSIS — Z9841 Cataract extraction status, right eye: Secondary | ICD-10-CM

## 2018-03-22 DIAGNOSIS — R29731 NIHSS score 31: Secondary | ICD-10-CM | POA: Diagnosis not present

## 2018-03-22 DIAGNOSIS — Z298 Encounter for other specified prophylactic measures: Secondary | ICD-10-CM | POA: Diagnosis not present

## 2018-03-22 DIAGNOSIS — J95859 Other complication of respirator [ventilator]: Secondary | ICD-10-CM

## 2018-03-22 DIAGNOSIS — Z79891 Long term (current) use of opiate analgesic: Secondary | ICD-10-CM

## 2018-03-22 DIAGNOSIS — I739 Peripheral vascular disease, unspecified: Secondary | ICD-10-CM | POA: Diagnosis present

## 2018-03-22 DIAGNOSIS — I69051 Hemiplegia and hemiparesis following nontraumatic subarachnoid hemorrhage affecting right dominant side: Secondary | ICD-10-CM | POA: Diagnosis not present

## 2018-03-22 DIAGNOSIS — R4701 Aphasia: Secondary | ICD-10-CM | POA: Diagnosis not present

## 2018-03-22 DIAGNOSIS — Z23 Encounter for immunization: Secondary | ICD-10-CM

## 2018-03-22 DIAGNOSIS — D6481 Anemia due to antineoplastic chemotherapy: Secondary | ICD-10-CM | POA: Diagnosis present

## 2018-03-22 DIAGNOSIS — T502X5A Adverse effect of carbonic-anhydrase inhibitors, benzothiadiazides and other diuretics, initial encounter: Secondary | ICD-10-CM | POA: Diagnosis not present

## 2018-03-22 DIAGNOSIS — R001 Bradycardia, unspecified: Secondary | ICD-10-CM | POA: Diagnosis present

## 2018-03-22 DIAGNOSIS — Z79899 Other long term (current) drug therapy: Secondary | ICD-10-CM

## 2018-03-22 DIAGNOSIS — G9341 Metabolic encephalopathy: Secondary | ICD-10-CM | POA: Diagnosis not present

## 2018-03-22 DIAGNOSIS — R7303 Prediabetes: Secondary | ICD-10-CM | POA: Diagnosis not present

## 2018-03-22 DIAGNOSIS — B9620 Unspecified Escherichia coli [E. coli] as the cause of diseases classified elsewhere: Secondary | ICD-10-CM | POA: Diagnosis present

## 2018-03-22 DIAGNOSIS — R0902 Hypoxemia: Secondary | ICD-10-CM

## 2018-03-22 DIAGNOSIS — R739 Hyperglycemia, unspecified: Secondary | ICD-10-CM | POA: Diagnosis present

## 2018-03-22 DIAGNOSIS — R29727 NIHSS score 27: Secondary | ICD-10-CM | POA: Diagnosis present

## 2018-03-22 DIAGNOSIS — E8770 Fluid overload, unspecified: Secondary | ICD-10-CM | POA: Diagnosis not present

## 2018-03-22 DIAGNOSIS — F419 Anxiety disorder, unspecified: Secondary | ICD-10-CM | POA: Diagnosis present

## 2018-03-22 DIAGNOSIS — J9811 Atelectasis: Secondary | ICD-10-CM | POA: Diagnosis present

## 2018-03-22 DIAGNOSIS — D638 Anemia in other chronic diseases classified elsewhere: Secondary | ICD-10-CM | POA: Diagnosis not present

## 2018-03-22 DIAGNOSIS — D539 Nutritional anemia, unspecified: Secondary | ICD-10-CM | POA: Diagnosis present

## 2018-03-22 DIAGNOSIS — I6032 Nontraumatic subarachnoid hemorrhage from left posterior communicating artery: Principal | ICD-10-CM | POA: Diagnosis present

## 2018-03-22 DIAGNOSIS — R490 Dysphonia: Secondary | ICD-10-CM | POA: Diagnosis not present

## 2018-03-22 DIAGNOSIS — K1379 Other lesions of oral mucosa: Secondary | ICD-10-CM | POA: Diagnosis not present

## 2018-03-22 DIAGNOSIS — J9690 Respiratory failure, unspecified, unspecified whether with hypoxia or hypercapnia: Secondary | ICD-10-CM

## 2018-03-22 DIAGNOSIS — J988 Other specified respiratory disorders: Secondary | ICD-10-CM | POA: Diagnosis not present

## 2018-03-22 DIAGNOSIS — K5790 Diverticulosis of intestine, part unspecified, without perforation or abscess without bleeding: Secondary | ICD-10-CM | POA: Diagnosis present

## 2018-03-22 DIAGNOSIS — T451X5A Adverse effect of antineoplastic and immunosuppressive drugs, initial encounter: Secondary | ICD-10-CM | POA: Diagnosis present

## 2018-03-22 DIAGNOSIS — Z833 Family history of diabetes mellitus: Secondary | ICD-10-CM

## 2018-03-22 DIAGNOSIS — J9601 Acute respiratory failure with hypoxia: Secondary | ICD-10-CM | POA: Diagnosis not present

## 2018-03-22 DIAGNOSIS — Z820 Family history of epilepsy and other diseases of the nervous system: Secondary | ICD-10-CM

## 2018-03-22 DIAGNOSIS — I959 Hypotension, unspecified: Secondary | ICD-10-CM | POA: Diagnosis present

## 2018-03-22 DIAGNOSIS — R9401 Abnormal electroencephalogram [EEG]: Secondary | ICD-10-CM | POA: Diagnosis present

## 2018-03-22 DIAGNOSIS — K219 Gastro-esophageal reflux disease without esophagitis: Secondary | ICD-10-CM | POA: Diagnosis present

## 2018-03-22 DIAGNOSIS — Z515 Encounter for palliative care: Secondary | ICD-10-CM | POA: Diagnosis not present

## 2018-03-22 DIAGNOSIS — J9801 Acute bronchospasm: Secondary | ICD-10-CM | POA: Diagnosis not present

## 2018-03-22 DIAGNOSIS — Z800 Family history of malignant neoplasm of digestive organs: Secondary | ICD-10-CM

## 2018-03-22 DIAGNOSIS — J690 Pneumonitis due to inhalation of food and vomit: Secondary | ICD-10-CM | POA: Diagnosis not present

## 2018-03-22 DIAGNOSIS — E669 Obesity, unspecified: Secondary | ICD-10-CM | POA: Diagnosis present

## 2018-03-22 DIAGNOSIS — D6959 Other secondary thrombocytopenia: Secondary | ICD-10-CM | POA: Diagnosis not present

## 2018-03-22 DIAGNOSIS — G629 Polyneuropathy, unspecified: Secondary | ICD-10-CM | POA: Diagnosis present

## 2018-03-22 DIAGNOSIS — R1312 Dysphagia, oropharyngeal phase: Secondary | ICD-10-CM | POA: Diagnosis not present

## 2018-03-22 DIAGNOSIS — L8990 Pressure ulcer of unspecified site, unspecified stage: Secondary | ICD-10-CM

## 2018-03-22 DIAGNOSIS — F329 Major depressive disorder, single episode, unspecified: Secondary | ICD-10-CM | POA: Diagnosis present

## 2018-03-22 DIAGNOSIS — C7951 Secondary malignant neoplasm of bone: Secondary | ICD-10-CM | POA: Diagnosis present

## 2018-03-22 DIAGNOSIS — G92 Toxic encephalopathy: Secondary | ICD-10-CM | POA: Diagnosis not present

## 2018-03-22 DIAGNOSIS — G9340 Encephalopathy, unspecified: Secondary | ICD-10-CM | POA: Diagnosis not present

## 2018-03-22 DIAGNOSIS — I671 Cerebral aneurysm, nonruptured: Secondary | ICD-10-CM | POA: Diagnosis present

## 2018-03-22 DIAGNOSIS — Z7982 Long term (current) use of aspirin: Secondary | ICD-10-CM

## 2018-03-22 DIAGNOSIS — E162 Hypoglycemia, unspecified: Secondary | ICD-10-CM | POA: Diagnosis not present

## 2018-03-22 DIAGNOSIS — Z923 Personal history of irradiation: Secondary | ICD-10-CM

## 2018-03-22 DIAGNOSIS — Z6838 Body mass index (BMI) 38.0-38.9, adult: Secondary | ICD-10-CM

## 2018-03-22 DIAGNOSIS — J9589 Other postprocedural complications and disorders of respiratory system, not elsewhere classified: Secondary | ICD-10-CM | POA: Diagnosis not present

## 2018-03-22 DIAGNOSIS — E559 Vitamin D deficiency, unspecified: Secondary | ICD-10-CM | POA: Diagnosis not present

## 2018-03-22 DIAGNOSIS — R601 Generalized edema: Secondary | ICD-10-CM | POA: Diagnosis not present

## 2018-03-22 DIAGNOSIS — R451 Restlessness and agitation: Secondary | ICD-10-CM | POA: Diagnosis not present

## 2018-03-22 DIAGNOSIS — R740 Nonspecific elevation of levels of transaminase and lactic acid dehydrogenase [LDH]: Secondary | ICD-10-CM | POA: Diagnosis not present

## 2018-03-22 DIAGNOSIS — C50919 Malignant neoplasm of unspecified site of unspecified female breast: Secondary | ICD-10-CM | POA: Diagnosis not present

## 2018-03-22 DIAGNOSIS — K449 Diaphragmatic hernia without obstruction or gangrene: Secondary | ICD-10-CM | POA: Diagnosis present

## 2018-03-22 DIAGNOSIS — G4700 Insomnia, unspecified: Secondary | ICD-10-CM | POA: Diagnosis not present

## 2018-03-22 DIAGNOSIS — R7309 Other abnormal glucose: Secondary | ICD-10-CM | POA: Diagnosis not present

## 2018-03-22 DIAGNOSIS — G9389 Other specified disorders of brain: Secondary | ICD-10-CM | POA: Diagnosis present

## 2018-03-22 DIAGNOSIS — M1990 Unspecified osteoarthritis, unspecified site: Secondary | ICD-10-CM | POA: Diagnosis present

## 2018-03-22 LAB — COMPREHENSIVE METABOLIC PANEL
ALBUMIN: 3.2 g/dL — AB (ref 3.5–5.0)
ALK PHOS: 76 U/L (ref 38–126)
ALT: 35 U/L (ref 0–44)
ANION GAP: 10 (ref 5–15)
AST: 33 U/L (ref 15–41)
BUN: 6 mg/dL (ref 6–20)
CALCIUM: 8.5 mg/dL — AB (ref 8.9–10.3)
CO2: 25 mmol/L (ref 22–32)
Chloride: 105 mmol/L (ref 98–111)
Creatinine, Ser: 0.76 mg/dL (ref 0.44–1.00)
GFR calc Af Amer: >60 mL/min (ref >60)
GFR calc non Af Amer: >60 mL/min (ref >60)
Glucose, Bld: 180 mg/dL — ABNORMAL HIGH (ref 70–99)
POTASSIUM: 3.6 mmol/L (ref 3.5–5.1)
SODIUM: 140 mmol/L (ref 135–145)
Total Bilirubin: 0.5 mg/dL (ref 0.3–1.2)
Total Protein: 5.8 g/dL — ABNORMAL LOW (ref 6.5–8.1)

## 2018-03-22 LAB — CBC WITH DIFFERENTIAL/PLATELET
BASOS ABS: 0 10*3/uL (ref 0.0–0.1)
BASOS PCT: 0 %
EOS ABS: 0 10*3/uL (ref 0.0–0.7)
Eosinophils Relative: 0 %
HCT: 31.8 % — ABNORMAL LOW (ref 36.0–46.0)
HEMOGLOBIN: 9.7 g/dL — AB (ref 12.0–15.0)
LYMPHS ABS: 1.5 10*3/uL (ref 0.7–4.0)
Lymphocytes Relative: 13 %
MCH: 30.9 pg (ref 26.0–34.0)
MCHC: 30.5 g/dL (ref 30.0–36.0)
MCV: 101.3 fL — ABNORMAL HIGH (ref 78.0–100.0)
MONO ABS: 0.3 10*3/uL (ref 0.1–1.0)
Monocytes Relative: 3 %
NEUTROS ABS: 9.7 10*3/uL — AB (ref 1.7–7.7)
Neutrophils Relative %: 84 %
Platelets: 73 10*3/uL — ABNORMAL LOW (ref 150–400)
RBC: 3.14 MIL/uL — ABNORMAL LOW (ref 3.87–5.11)
RDW: 18.9 % — AB (ref 11.5–15.5)
WBC: 11.5 10*3/uL — ABNORMAL HIGH (ref 4.0–10.5)

## 2018-03-22 LAB — I-STAT CHEM 8, ED
BUN: 5 mg/dL — ABNORMAL LOW (ref 6–20)
CALCIUM ION: 1.15 mmol/L (ref 1.15–1.40)
CREATININE: 0.6 mg/dL (ref 0.44–1.00)
Chloride: 103 mmol/L (ref 98–111)
GLUCOSE: 183 mg/dL — AB (ref 70–99)
HCT: 28 % — ABNORMAL LOW (ref 36.0–46.0)
HEMOGLOBIN: 9.5 g/dL — AB (ref 12.0–15.0)
Potassium: 3.5 mmol/L (ref 3.5–5.1)
SODIUM: 139 mmol/L (ref 135–145)
TCO2: 27 mmol/L (ref 22–32)

## 2018-03-22 LAB — GLUCOSE, CAPILLARY
Glucose-Capillary: 144 mg/dL — ABNORMAL HIGH (ref 70–99)
Glucose-Capillary: 130 mg/dL — ABNORMAL HIGH (ref 70–99)
Glucose-Capillary: 124 mg/dL — ABNORMAL HIGH (ref 70–99)
Glucose-Capillary: 115 mg/dL — ABNORMAL HIGH (ref 70–99)
Glucose-Capillary: 136 mg/dL — ABNORMAL HIGH (ref 70–99)

## 2018-03-22 LAB — POCT I-STAT 3, ART BLOOD GAS (G3+)
Acid-base deficit: 2 mmol/L (ref 0.0–2.0)
Bicarbonate: 24.3 mmol/L (ref 20.0–28.0)
Bicarbonate: 25.6 mmol/L (ref 20.0–28.0)
O2 SAT: 94 %
O2 SAT: 99 %
PCO2 ART: 50.5 mmHg — AB (ref 32.0–48.0)
PO2 ART: 164 mmHg — AB (ref 83.0–108.0)
Patient temperature: 38.4
TCO2: 26 mmol/L (ref 22–32)
TCO2: 27 mmol/L (ref 22–32)
pCO2 arterial: 51.1 mmHg — ABNORMAL HIGH (ref 32.0–48.0)
pH, Arterial: 7.286 — ABNORMAL LOW (ref 7.350–7.450)
pH, Arterial: 7.319 — ABNORMAL LOW (ref 7.350–7.450)
pO2, Arterial: 82 mmHg — ABNORMAL LOW (ref 83.0–108.0)

## 2018-03-22 LAB — I-STAT ARTERIAL BLOOD GAS, ED
ACID-BASE DEFICIT: 1 mmol/L (ref 0.0–2.0)
Bicarbonate: 25.8 mmol/L (ref 20.0–28.0)
O2 SAT: 96 %
PH ART: 7.32 — AB (ref 7.350–7.450)
PO2 ART: 80 mmHg — AB (ref 83.0–108.0)
TCO2: 27 mmol/L (ref 22–32)
pCO2 arterial: 49 mmHg — ABNORMAL HIGH (ref 32.0–48.0)

## 2018-03-22 LAB — PROTIME-INR
INR: 1.04
Prothrombin Time: 13.5 seconds (ref 11.4–15.2)

## 2018-03-22 LAB — MRSA PCR SCREENING: MRSA by PCR: NEGATIVE

## 2018-03-22 MED ORDER — MIDAZOLAM HCL 2 MG/2ML IJ SOLN
INTRAMUSCULAR | Status: AC
  Filled 2018-03-22: qty 2

## 2018-03-22 MED ORDER — CLEVIDIPINE BUTYRATE 0.5 MG/ML IV EMUL: 0.0000 mg/h | INTRAVENOUS | Status: DC

## 2018-03-22 MED ORDER — STROKE: EARLY STAGES OF RECOVERY BOOK
Freq: Once | Status: DC
  Filled 2018-03-22: qty 1

## 2018-03-22 MED ORDER — PANTOPRAZOLE SODIUM 40 MG PO TBEC: 40.0000 mg | DELAYED_RELEASE_TABLET | Freq: Every day | ORAL | Status: DC

## 2018-03-22 MED ORDER — IOPAMIDOL (ISOVUE-370) INJECTION 76%
50.0000 mL | Freq: Once | INTRAVENOUS | Status: AC | PRN
  Administered 2018-03-22: 50 mL via INTRAVENOUS

## 2018-03-22 MED ORDER — DOCUSATE SODIUM 100 MG PO CAPS
100.0000 mg | ORAL_CAPSULE | Freq: Two times a day (BID) | ORAL | Status: DC
  Filled 2018-03-22: qty 1

## 2018-03-22 MED ORDER — MIDAZOLAM HCL 2 MG/2ML IJ SOLN
1.0000 mg | INTRAMUSCULAR | Status: DC | PRN
  Administered 2018-03-22: 05:00:00 via INTRAVENOUS

## 2018-03-22 MED ORDER — DEXMEDETOMIDINE HCL IN NACL 200 MCG/50ML IV SOLN
0.0000 ug/kg/h | INTRAVENOUS | Status: DC
  Administered 2018-03-22: 1 ug/kg/h via INTRAVENOUS
  Administered 2018-03-22: 0.4 ug/kg/h via INTRAVENOUS
  Filled 2018-03-22 (×2): qty 50

## 2018-03-22 MED ORDER — FENTANYL 2500MCG IN NS 250ML (10MCG/ML) PREMIX INFUSION
25.0000 ug/h | INTRAVENOUS | Status: DC
  Administered 2018-03-22: 250 ug/h via INTRAVENOUS
  Administered 2018-03-22: 25 ug/h via INTRAVENOUS
  Administered 2018-03-23: 175 ug/h via INTRAVENOUS
  Administered 2018-03-24: 150 ug/h via INTRAVENOUS
  Filled 2018-03-22 (×4): qty 250

## 2018-03-22 MED ORDER — IOPAMIDOL (ISOVUE-370) INJECTION 76%
INTRAVENOUS | Status: AC
  Filled 2018-03-22: qty 50

## 2018-03-22 MED ORDER — NICARDIPINE HCL IN NACL 20-0.86 MG/200ML-% IV SOLN
INTRAVENOUS | Status: AC
  Filled 2018-03-22: qty 200

## 2018-03-22 MED ORDER — PANTOPRAZOLE SODIUM 40 MG PO PACK
40.0000 mg | PACK | Freq: Every day | ORAL | Status: DC
  Administered 2018-03-22 – 2018-03-24 (×3): 40 mg
  Filled 2018-03-22 (×4): qty 20

## 2018-03-22 MED ORDER — SODIUM CHLORIDE 0.9 % IV SOLN
0.0000 mg/h | INTRAVENOUS | Status: DC
  Filled 2018-03-22: qty 10

## 2018-03-22 MED ORDER — NICARDIPINE HCL IN NACL 20-0.86 MG/200ML-% IV SOLN
0.0000 mg/h | INTRAVENOUS | Status: DC
  Administered 2018-03-22: 9 mg/h via INTRAVENOUS
  Administered 2018-03-22: 12.5 mg/h via INTRAVENOUS
  Administered 2018-03-24 (×2): 15 mg/h via INTRAVENOUS
  Administered 2018-03-25: 2.5 mg/h via INTRAVENOUS
  Administered 2018-03-25: 15 mg/h via INTRAVENOUS
  Administered 2018-03-25: 7.5 mg/h via INTRAVENOUS
  Filled 2018-03-22 (×6): qty 200

## 2018-03-22 MED ORDER — FENTANYL 2500MCG IN NS 250ML (10MCG/ML) PREMIX INFUSION
25.0000 ug/h | INTRAVENOUS | Status: DC
  Administered 2018-03-22: 150 ug/h via INTRAVENOUS

## 2018-03-22 MED ORDER — FENTANYL BOLUS VIA INFUSION
25.0000 ug | INTRAVENOUS | Status: DC | PRN
  Filled 2018-03-22: qty 25

## 2018-03-22 MED ORDER — MIDAZOLAM BOLUS VIA INFUSION
1.0000 mg | INTRAVENOUS | Status: DC | PRN
  Filled 2018-03-22: qty 2

## 2018-03-22 MED ORDER — MIDAZOLAM HCL 2 MG/2ML IJ SOLN
2.0000 mg | INTRAMUSCULAR | Status: DC | PRN
  Administered 2018-03-23: 2 mg via INTRAVENOUS
  Filled 2018-03-22: qty 2

## 2018-03-22 MED ORDER — ONDANSETRON HCL 4 MG/2ML IJ SOLN: 4.0000 mg | Freq: Four times a day (QID) | INTRAMUSCULAR | Status: DC | PRN

## 2018-03-22 MED ORDER — ACETAMINOPHEN 160 MG/5ML PO SOLN
650.0000 mg | ORAL | Status: DC | PRN
  Administered 2018-03-22 – 2018-04-16 (×27): 650 mg
  Filled 2018-03-22 (×29): qty 20.3

## 2018-03-22 MED ORDER — ACETAMINOPHEN 325 MG PO TABS
650.0000 mg | ORAL_TABLET | ORAL | Status: DC | PRN
  Administered 2018-03-25 – 2018-04-08 (×5): 650 mg via ORAL
  Filled 2018-03-22 (×4): qty 2

## 2018-03-22 MED ORDER — ONDANSETRON 4 MG PO TBDP: 4.0000 mg | ORAL_TABLET | Freq: Four times a day (QID) | ORAL | Status: DC | PRN

## 2018-03-22 MED ORDER — CHLORHEXIDINE GLUCONATE 0.12% ORAL RINSE (MEDLINE KIT)
15.0000 mL | Freq: Two times a day (BID) | OROMUCOSAL | Status: DC
  Administered 2018-03-22 – 2018-04-15 (×48): 15 mL via OROMUCOSAL

## 2018-03-22 MED ORDER — FENTANYL BOLUS VIA INFUSION
50.0000 ug | INTRAVENOUS | Status: DC | PRN
  Filled 2018-03-22: qty 50

## 2018-03-22 MED ORDER — ALBUTEROL SULFATE (2.5 MG/3ML) 0.083% IN NEBU
2.5000 mg | INHALATION_SOLUTION | RESPIRATORY_TRACT | Status: DC | PRN
  Administered 2018-03-26: 2.5 mg via RESPIRATORY_TRACT
  Filled 2018-03-22: qty 3

## 2018-03-22 MED ORDER — SODIUM CHLORIDE 0.9 % IV SOLN
INTRAVENOUS | Status: DC
  Administered 2018-03-22 – 2018-03-27 (×8): via INTRAVENOUS

## 2018-03-22 MED ORDER — IPRATROPIUM-ALBUTEROL 0.5-2.5 (3) MG/3ML IN SOLN
3.0000 mL | Freq: Four times a day (QID) | RESPIRATORY_TRACT | Status: DC
  Administered 2018-03-22 – 2018-03-26 (×16): 3 mL via RESPIRATORY_TRACT
  Filled 2018-03-22 (×16): qty 3

## 2018-03-22 MED ORDER — NIMODIPINE 30 MG PO CAPS
60.0000 mg | ORAL_CAPSULE | ORAL | Status: DC
  Administered 2018-04-08 – 2018-04-09 (×5): 60 mg via ORAL
  Filled 2018-03-22 (×10): qty 2

## 2018-03-22 MED ORDER — MIDAZOLAM HCL 2 MG/2ML IJ SOLN
1.0000 mg | INTRAMUSCULAR | Status: DC | PRN
  Administered 2018-03-22 (×2): 1 mg via INTRAVENOUS

## 2018-03-22 MED ORDER — DEXMEDETOMIDINE HCL IN NACL 400 MCG/100ML IV SOLN
0.4000 ug/kg/h | INTRAVENOUS | Status: DC
  Administered 2018-03-22: 1 ug/kg/h via INTRAVENOUS
  Administered 2018-03-22: 0.8 ug/kg/h via INTRAVENOUS
  Administered 2018-03-22: 0.9 ug/kg/h via INTRAVENOUS
  Administered 2018-03-23: 0.6 ug/kg/h via INTRAVENOUS
  Administered 2018-03-23: 0.8 ug/kg/h via INTRAVENOUS
  Administered 2018-03-23: 0.6 ug/kg/h via INTRAVENOUS
  Administered 2018-03-24: 0.5 ug/kg/h via INTRAVENOUS
  Filled 2018-03-22 (×6): qty 100

## 2018-03-22 MED ORDER — SODIUM CHLORIDE 0.9 % IV SOLN
Freq: Once | INTRAVENOUS | Status: AC
  Administered 2018-03-22: 05:00:00 via INTRAVENOUS

## 2018-03-22 MED ORDER — FENTANYL CITRATE (PF) 100 MCG/2ML IJ SOLN
50.0000 ug | Freq: Once | INTRAMUSCULAR | Status: AC
  Administered 2018-03-22: 50 ug via INTRAVENOUS
  Filled 2018-03-22: qty 2

## 2018-03-22 MED ORDER — NIMODIPINE 60 MG/20ML PO SOLN
60.0000 mg | ORAL | Status: DC
  Administered 2018-03-22 – 2018-04-12 (×115): 60 mg
  Filled 2018-03-22 (×115): qty 20

## 2018-03-22 MED ORDER — INSULIN ASPART 100 UNIT/ML ~~LOC~~ SOLN
0.0000 [IU] | SUBCUTANEOUS | Status: DC
  Administered 2018-03-22 – 2018-03-24 (×5): 3 [IU] via SUBCUTANEOUS
  Administered 2018-03-24 (×2): 4 [IU] via SUBCUTANEOUS
  Administered 2018-03-24: 7 [IU] via SUBCUTANEOUS
  Administered 2018-03-24: 3 [IU] via SUBCUTANEOUS
  Administered 2018-03-25 – 2018-03-26 (×7): 4 [IU] via SUBCUTANEOUS
  Administered 2018-03-26: 7 [IU] via SUBCUTANEOUS
  Administered 2018-03-26: 4 [IU] via SUBCUTANEOUS
  Administered 2018-03-26: 3 [IU] via SUBCUTANEOUS
  Administered 2018-03-26: 4 [IU] via SUBCUTANEOUS
  Administered 2018-03-26: 3 [IU] via SUBCUTANEOUS
  Administered 2018-03-27 (×5): 7 [IU] via SUBCUTANEOUS
  Administered 2018-03-27: 4 [IU] via SUBCUTANEOUS
  Administered 2018-03-28: 7 [IU] via SUBCUTANEOUS
  Administered 2018-03-28 (×2): 4 [IU] via SUBCUTANEOUS
  Administered 2018-03-28: 3 [IU] via SUBCUTANEOUS
  Administered 2018-03-28: 4 [IU] via SUBCUTANEOUS
  Administered 2018-03-29 – 2018-03-30 (×9): 3 [IU] via SUBCUTANEOUS
  Administered 2018-03-30: 4 [IU] via SUBCUTANEOUS
  Administered 2018-03-30 – 2018-03-31 (×3): 3 [IU] via SUBCUTANEOUS
  Administered 2018-03-31 (×2): 4 [IU] via SUBCUTANEOUS
  Administered 2018-03-31: 3 [IU] via SUBCUTANEOUS
  Administered 2018-03-31: 4 [IU] via SUBCUTANEOUS
  Administered 2018-03-31 – 2018-04-01 (×2): 3 [IU] via SUBCUTANEOUS
  Administered 2018-04-01: 4 [IU] via SUBCUTANEOUS
  Administered 2018-04-01 (×2): 3 [IU] via SUBCUTANEOUS
  Administered 2018-04-01: 4 [IU] via SUBCUTANEOUS
  Administered 2018-04-02: 3 [IU] via SUBCUTANEOUS
  Administered 2018-04-02 (×3): 4 [IU] via SUBCUTANEOUS
  Administered 2018-04-02: 3 [IU] via SUBCUTANEOUS
  Administered 2018-04-02 – 2018-04-03 (×2): 4 [IU] via SUBCUTANEOUS
  Administered 2018-04-03: 7 [IU] via SUBCUTANEOUS
  Administered 2018-04-03 – 2018-04-05 (×8): 3 [IU] via SUBCUTANEOUS

## 2018-03-22 MED ORDER — ACETAMINOPHEN 650 MG RE SUPP
650.0000 mg | RECTAL | Status: DC | PRN
  Administered 2018-04-04: 650 mg via RECTAL
  Filled 2018-03-22: qty 1

## 2018-03-22 MED ORDER — SODIUM CHLORIDE 0.9 % IV SOLN
INTRAVENOUS | Status: DC | PRN
  Administered 2018-03-22 – 2018-03-24 (×2): 250 mL via INTRAVENOUS

## 2018-03-22 MED ORDER — NICARDIPINE HCL IN NACL 20-0.86 MG/200ML-% IV SOLN
3.0000 mg/h | Freq: Once | INTRAVENOUS | Status: AC
  Administered 2018-03-22: 5 mg/h via INTRAVENOUS
  Filled 2018-03-22: qty 200

## 2018-03-22 MED ORDER — NOREPINEPHRINE 4 MG/250ML-% IV SOLN
0.0000 ug/min | INTRAVENOUS | Status: DC
  Administered 2018-03-22: 8 ug/min via INTRAVENOUS
  Filled 2018-03-22 (×3): qty 250

## 2018-03-22 MED ORDER — FUROSEMIDE 10 MG/ML IJ SOLN
40.0000 mg | Freq: Once | INTRAMUSCULAR | Status: AC
  Administered 2018-03-22: 40 mg via INTRAVENOUS
  Filled 2018-03-22: qty 4

## 2018-03-22 MED ORDER — LEVETIRACETAM IN NACL 1000 MG/100ML IV SOLN
1000.0000 mg | Freq: Two times a day (BID) | INTRAVENOUS | Status: DC
  Administered 2018-03-22 – 2018-03-26 (×8): 1000 mg via INTRAVENOUS
  Filled 2018-03-22 (×8): qty 100

## 2018-03-22 MED ORDER — ORAL CARE MOUTH RINSE
15.0000 mL | OROMUCOSAL | Status: DC
  Administered 2018-03-22 – 2018-04-04 (×125): 15 mL via OROMUCOSAL

## 2018-03-22 NOTE — Consult Note (Signed)
Reason for Consult: Subarachnoid hemorrhage grade 5 Referring Physician: Emergency department  Diane Short is an 62 y.o. female.  HPI: 62 year old female with a long and complicated past medical history to include progressive stage IV breast cancer with current completion of second cycle of chemotherapy for palliative management.  She is also recently had radiation stereotactic radiosurgery for a single solitary brain met.  Of note she is got a history of 5 intracerebral aneurysms 3 of whom have been treated endovascularly.  She has 2 remaining a right P-comm and an Acomm.  She had a progressive decline over the last few weeks according to her husband and last night she went to bed and awoke around 1:00 sat inside the bed was responsive to her husband saying she felt fine although she did at one point say she thought she might of had a stroke she was not complaining of any worsening of her chronic headache.  Sooner she got up she collapsed and fell backwards into the bed and then table.  Husband feels that she had some event while she was sleeping that woke her up and to the patient's perspective represented a possible stroke.  Patient was seen by EMS taken to [facility] and on route to [place] became unresponsive requiring intubation at [facility] CT scan showed subarachnoid hemorrhage and patient has been transferred here for evaluation.  Husband does say that she is got incurable stage IV progressive breast cancer and is actively in the middle of chemotherapy having finished her second cycle last week scheduled to have a week off this week.  Has noted be markedly anemic this past week with a hemoglobin dropping to 7 was transfused 2 units of packed cells.  Has had problems with chronic and progressive fatigue lack of energy and generalized weakness.  Past Medical History:  Diagnosis Date  . 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 Anxiety   . Arthritis   . Cancer [facility])    breast cancer  . Cerebral hemorrhage  ([place]) 1986  . Depression    due to cancer dx  . Diverticulosis   . GERD (gastroesophageal reflux disease)   . Headache   . History of hiatal hernia   . Hypertension   . 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 Neutropenic fever ([place])    Chemo related  . Osteonecrosis ([place]) 2017-2018   Right Jaw bone  . Thrush, oral    Chemo related    Past Surgical History:  Procedure Laterality Date  . BREAST SURGERY Left 11/2016   metastic to bone  . CATARACT EXTRACTION Right 03/02/2017  . CEREBRAL ANGIOGRAM  1986  . CESAREAN SECTION  1980  . IR 3D INDEPENDENT [facility]KST  01/20/2017  . IR 3D INDEPENDENT [facility]KST  05/26/2017  . IR ANGIO INTRA EXTRACRAN SEL COM CAROTID INNOMINATE UNI R MOD SED  04/09/2017  . IR ANGIO INTRA EXTRACRAN SEL INTERNAL CAROTID BILAT MOD SED  01/20/2017  . IR ANGIO INTRA EXTRACRAN SEL INTERNAL CAROTID BILAT MOD SED  11/17/2017  . IR ANGIO INTRA EXTRACRAN SEL INTERNAL CAROTID UNI R MOD SED  03/13/2017  . IR ANGIO VERTEBRAL SEL VERTEBRAL BILAT MOD SED  01/20/2017  . IR ANGIO VERTEBRAL SEL VERTEBRAL UNI L MOD SED  05/26/2017  . IR ANGIO VERTEBRAL SEL VERTEBRAL UNI L MOD SED  11/17/2017  . IR ANGIOGRAM FOLLOW UP STUDY  03/13/2017  . IR ANGIOGRAM FOLLOW UP STUDY  03/13/2017  . IR ANGIOGRAM FOLLOW UP STUDY  03/13/2017  . IR ANGIOGRAM FOLLOW UP STUDY  03/13/2017  . IR ANGIOGRAM FOLLOW UP STUDY  03/13/2017  . IR ANGIOGRAM FOLLOW UP STUDY  03/13/2017  . IR ANGIOGRAM FOLLOW UP STUDY  03/13/2017  . IR ANGIOGRAM FOLLOW UP STUDY  03/13/2017  . IR ANGIOGRAM FOLLOW UP STUDY  03/13/2017  . IR ANGIOGRAM FOLLOW UP STUDY  03/13/2017  . IR ANGIOGRAM FOLLOW UP STUDY  03/13/2017  . IR ANGIOGRAM FOLLOW UP STUDY  03/13/2017  . IR ANGIOGRAM FOLLOW UP STUDY  04/09/2017  . IR ANGIOGRAM FOLLOW UP STUDY  05/26/2017  . IR ANGIOGRAM FOLLOW UP STUDY  05/26/2017  . IR NEURO EACH ADD'L AFTER BASIC UNI LEFT (MS)  04/09/2017  . IR NEURO EACH ADD'L AFTER BASIC UNI RIGHT (MS)  03/13/2017  . IR TRANSCATH/EMBOLIZ  03/13/2017  . IR  TRANSCATH/EMBOLIZ  04/09/2017  . IR TRANSCATH/EMBOLIZ  05/26/2017  . IR US GUIDE VASC ACCESS RIGHT  04/09/2017  . MASTECTOMY Left   . port a cathh  04/2016  . RADIOLOGY WITH ANESTHESIA N/A 03/13/2017   Procedure: stent coiling of right MCA aneurysm;  Surgeon: Consuella Lose, MD;  Location: [place];  Service: Radiology;  Laterality: N/A;  . RADIOLOGY WITH ANESTHESIA N/A 04/09/2017   Procedure: Arteriogram, pipeline embolization of aneruysm;  Surgeon: Consuella Lose, MD;  Location: [place];  Service: Radiology;  Laterality: N/A;  . RADIOLOGY WITH ANESTHESIA N/A 05/26/2017   Procedure: Stent supported coil embolization;  Surgeon: Consuella Lose, MD;  Location: [place];  Service: Radiology;  Laterality: N/A;  . TUBAL LIGATION  1983    Family History  Problem Relation Age of Onset  . Alzheimer's disease Mother   . Colon cancer Father   . Cerebral aneurysm Father   . Diabetes Sister   . Stomach cancer Brother   . Esophageal cancer Brother   . Multiple myeloma Brother   . Hypertension Sister     Social History:  reports that she has been smoking cigarettes. She has a 21.00 pack-year smoking history. She has never used smokeless tobacco. She reports that she does not drink alcohol or use drugs.  Allergies:  Allergies  Allergen Reactions  . Nsaids Other (See Comments)    GI upset   . Ace Inhibitors Other (See Comments)    cough    Medications: I have reviewed the patient's current medications.  Results for orders placed or performed during the hospital encounter of 03/22/18 (from the past 48 hour(s))  Comprehensive metabolic panel     Status: Abnormal   Collection Time: 03/22/18  5:39 AM  Result Value Ref Range   Sodium 140 135 - 145 mmol/L   Potassium 3.6 3.5 - 5.1 mmol/L   Chloride 105 98 - 111 mmol/L   CO2 25 22 - 32 mmol/L   Glucose, Bld 180 (H) 70 - 99 mg/dL   BUN 6 6 - 20 mg/dL   Creatinine, Ser 0.76 0.44 - 1.00 mg/dL   Calcium 8.5 (L) 8.9 - 10.3 mg/dL   Total  Protein 5.8 (L) 6.5 - 8.1 g/dL   Albumin 3.2 (L) 3.5 - 5.0 g/dL   AST 33 15 - 41 U/L   ALT 35 0 - 44 U/L   Alkaline Phosphatase 76 38 - 126 U/L   Total Bilirubin 0.5 0.3 - 1.2 mg/dL   GFR calc non Af Amer >60 >60 mL/min   GFR calc Af Amer >60 >60 mL/min    Comment: (NOTE) The eGFR has been calculated using  the CKD EPI equation. This calculation has not been validated in all clinical situations. eGFR's persistently <60 mL/min signify possible Chronic Kidney Disease.    Anion gap 10 5 - 15    Comment: Performed at [facility] [address] [place], [place] [address]  I-Stat Chem 8, ED     Status: Abnormal   Collection Time: 03/22/18  5:46 AM  Result Value Ref Range   Sodium 139 135 - 145 mmol/L   Potassium 3.5 3.5 - 5.1 mmol/L   Chloride 103 98 - 111 mmol/L   BUN 5 (L) 6 - 20 mg/dL   Creatinine, Ser 0.60 0.44 - 1.00 mg/dL   Glucose, Bld 183 (H) 70 - 99 mg/dL   Calcium, Ion 1.15 1.15 - 1.40 mmol/L   TCO2 27 22 - 32 mmol/L   Hemoglobin 9.5 (L) 12.0 - 15.0 g/dL   HCT 28.0 (L) 36.0 - 46.0 %    No results found.  Review of Systems  Unable to perform ROS: Critical illness   Blood pressure 117/78, pulse 87, temperature (!) 95.1 F (35.1 C), temperature source Temporal, resp. rate (!) 9, height '5\' 4"'  (1.626 m), last menstrual period 08/14/2010, SpO2 100 %. Physical Exam  Neurological: She is unresponsive.  Pupils pinpoint currently on fentanyl and Versed minimal response to noxious stimulation but patient did have spontaneous movement with a gag of all 4 extremities.    Assessment/Plan: 62 year old female with multiple medical problems to include progressive stage IV breast cancer multiple intracranial aneurysms presents with subarachnoid hemorrhage possibly from rupture of 1 of her to untreated aneurysms.  We have ordered a CT angiogram will await interpretation of this recommend critical care admission medicine consult possibly even neurology evaluation for possible  stroke as the impetus of her collapse.  There was no history consistent with sudden onset headache that would more clearly point towards the rupture of the aneurysm is being the inciting event.  In addition she also had trauma when she fell back and hit her head although this would be an unusual location for traumatic hemorrhage is possible in the setting of this patient with multiple medical comorbidities.  Odie Rauen P 03/22/2018, 6:26 AM

## 2018-03-22 NOTE — Procedures (Signed)
Arterial Catheter Insertion Procedure Note Diane Short [id number] 1955/08/09  Procedure: Insertion of Arterial Catheter  Indications: Blood pressure monitoring  Procedure Details Consent: Unable to obtain consent because of altered level of consciousness. Time Out: Verified patient identification, verified procedure, site/side was marked, verified correct patient position, special equipment/implants available, medications/allergies/relevent history reviewed, required imaging and test results available.  Performed  Maximum sterile technique was used including antiseptics, cap, gloves, gown, hand hygiene, mask and sheet. Skin prep: Chlorhexidine; local anesthetic administered 20 gauge catheter was inserted into right radial artery using the Seldinger technique. ULTRASOUND GUIDANCE USED: NO Evaluation Blood flow good; BP tracing good. Complications: No apparent complications.   Gonzella Lex 03/22/2018

## 2018-03-22 NOTE — Progress Notes (Signed)
IV midazolam 50 mg/50 mL returned and wasted in clean room's sharps container. [place], CPhT is witness.

## 2018-03-22 NOTE — ED Provider Notes (Addendum)
[facility] EMERGENCY DEPARTMENT Provider Note  CSN: [id number] Arrival date & time: 03/22/18 0086  Chief Complaint(s) No chief complaint on file.  HPI Diane Short is a 62 y.o. 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  She presented there for sudden onset headache, patient also had fall following the headache resulting in head trauma on the nightstand.  EMS was called and noted that the patient was altered, bradycardic and was throwing up.  Upon arrival to [facility], patient was intubated for airway protection, he reported that his CT confirmed evidence of aneurysmal bleed.  Pressures were controlled with fentanyl and propofol.  However she was weaned off of propofol due to low blood pressures.  In route, EMS reported that the patient remain stable with blood pressures in the 110s. she required an additional 100 mcg of fentanyl, but no other intervention.  Remainder of history, ROS, and physical exam limited due to patient's condition (intubation, sedation). Additional information was obtained from transferring MD, EMS, and records.   Level V Caveat.  On review of records from [place]: labs revealed WBC 7.6, hemoglobin 9.6, hematocrit 28.7, platelets of 57.  INR 1.1.   Chest x-ray showed appropriate ET tube placement with no evidence of pulmonary infiltrates.   CT scan confirmed acute subarachnoid hemorrhage predominantly within the basilar cistern with extension into the ventricles.  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  HPI  Past Medical History Past Medical History:  Diagnosis Date  . 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 Anxiety   . Arthritis   . Cancer [facility])    breast cancer  . Cerebral hemorrhage ([place]) 1986  .  Depression    due to cancer dx  . Diverticulosis   . GERD (gastroesophageal reflux disease)   . Headache   . History of hiatal hernia   . Hypertension   . 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 Neutropenic fever ([place])    Chemo related  . Osteonecrosis ([place]) 2017-2018   Right Jaw bone  . Thrush, oral    Chemo related   Patient Active Problem List   Diagnosis Date Noted  . Malignant neoplasm of left breast in female, estrogen receptor negative ([place]) 01/07/2018  . Brain metastasis ([place]) 01/07/2018  . Cerebral aneurysm, nonruptured 03/13/2017   Home Medication(s) Prior to Admission medications   Medication Sig Start Date End Date Taking? Authorizing Provider  acetaminophen (TYLENOL) 500 MG tablet Take 1,000 mg by mouth 2 (two) times daily as needed for mild pain.    Yes [provider]  aspirin 81 MG tablet Take 81 mg by mouth daily.    Yes [provider]  Calcium Citrate-Vitamin D (CALCIUM CITRATE + D3 PO) Take 1-2 tablets by mouth 2 (two) times daily. Takes 2 in the am and 1 in the evening   Yes [provider]  carvedilol (COREG) 25 MG tablet Take 25 mg by mouth 2 (two) times daily with a meal.   Yes [provider]  citalopram (CELEXA) 20 MG tablet Take 20 mg by mouth daily.   Yes [provider]  diphenhydrAMINE (BENADRYL) 25 MG tablet Take 25 mg by mouth at bedtime as needed for sleep.   Yes [provider]  loperamide (IMODIUM A-D) 2 MG tablet Take 2-4 mg  by mouth daily as needed for diarrhea or loose stools.   Yes [provider]  loratadine (CLARITIN) 10 MG tablet Take 10 mg by mouth daily.   Yes [provider]  LORazepam (ATIVAN) 0.5 MG tablet Take 0.5 mg by mouth every 4 (four) hours as needed for anxiety.   Yes [provider]  omeprazole (PRILOSEC) 20 MG capsule Take 20 mg by mouth daily.   Yes [provider]  ondansetron (ZOFRAN) 4 MG tablet Take 4 mg by mouth every 4 (four) hours as  needed for nausea or vomiting.   Yes [provider]  prochlorperazine (COMPAZINE) 10 MG tablet Take 10 mg by mouth every 6 (six) hours as needed for nausea or vomiting.   Yes [provider]  traMADol (ULTRAM) 50 MG tablet Take 50 mg by mouth every 6 (six) hours as needed for moderate pain.   Yes [provider]                                                                                                                                    Past Surgical History Past Surgical History:  Procedure Laterality Date  . BREAST SURGERY Left 11/2016   metastic to bone  . CATARACT EXTRACTION Right 03/02/2017  . CEREBRAL ANGIOGRAM  1986  . CESAREAN SECTION  1980  . IR 3D INDEPENDENT WKST  01/20/2017  . IR 3D INDEPENDENT WKST  05/26/2017  . IR ANGIO INTRA EXTRACRAN SEL COM CAROTID INNOMINATE UNI R MOD SED  04/09/2017  . IR ANGIO INTRA EXTRACRAN SEL INTERNAL CAROTID BILAT MOD SED  01/20/2017  . IR ANGIO INTRA EXTRACRAN SEL INTERNAL CAROTID BILAT MOD SED  11/17/2017  . IR ANGIO INTRA EXTRACRAN SEL INTERNAL CAROTID UNI R MOD SED  03/13/2017  . IR ANGIO VERTEBRAL SEL VERTEBRAL BILAT MOD SED  01/20/2017  . IR ANGIO VERTEBRAL SEL VERTEBRAL UNI L MOD SED  05/26/2017  . IR ANGIO VERTEBRAL SEL VERTEBRAL UNI L MOD SED  11/17/2017  . IR ANGIOGRAM FOLLOW UP STUDY  03/13/2017  . IR ANGIOGRAM FOLLOW UP STUDY  03/13/2017  . IR ANGIOGRAM FOLLOW UP STUDY  03/13/2017  . IR ANGIOGRAM FOLLOW UP STUDY  03/13/2017  . IR ANGIOGRAM FOLLOW UP STUDY  03/13/2017  . IR ANGIOGRAM FOLLOW UP STUDY  03/13/2017  . IR ANGIOGRAM FOLLOW UP STUDY  03/13/2017  . IR ANGIOGRAM FOLLOW UP STUDY  03/13/2017  . IR ANGIOGRAM FOLLOW UP STUDY  03/13/2017  . IR ANGIOGRAM FOLLOW UP STUDY  03/13/2017  . IR ANGIOGRAM FOLLOW UP STUDY  03/13/2017  . IR ANGIOGRAM FOLLOW UP STUDY  03/13/2017  . IR ANGIOGRAM FOLLOW UP STUDY  04/09/2017  . IR ANGIOGRAM FOLLOW UP STUDY  05/26/2017  . IR ANGIOGRAM FOLLOW UP STUDY  05/26/2017  . IR NEURO EACH  ADD'L AFTER BASIC UNI LEFT (MS)  04/09/2017  . IR NEURO EACH ADD'L AFTER BASIC UNI RIGHT (  MS)  03/13/2017  . IR TRANSCATH/EMBOLIZ  03/13/2017  . IR TRANSCATH/EMBOLIZ  04/09/2017  . IR TRANSCATH/EMBOLIZ  05/26/2017  . IR US GUIDE VASC ACCESS RIGHT  04/09/2017  . MASTECTOMY Left   . port a cathh  04/2016  . RADIOLOGY WITH ANESTHESIA N/A 03/13/2017   Procedure: stent coiling of right MCA aneurysm;  Surgeon: Consuella Lose, MD;  Location: [facility];  Service: Radiology;  Laterality: N/A;  . RADIOLOGY WITH ANESTHESIA N/A 04/09/2017   Procedure: Arteriogram, pipeline embolization of aneruysm;  Surgeon: Consuella Lose, MD;  Location: [place];  Service: Radiology;  Laterality: N/A;  . RADIOLOGY WITH ANESTHESIA N/A 05/26/2017   Procedure: Stent supported coil embolization;  Surgeon: Consuella Lose, MD;  Location: [place];  Service: Radiology;  Laterality: N/A;  . TUBAL LIGATION  1983   Family History Family History  Problem Relation Age of Onset  . Alzheimer's disease Mother   . Colon cancer Father   . Cerebral aneurysm Father   . Diabetes Sister   . Stomach cancer Brother   . Esophageal cancer Brother   . Multiple myeloma Brother   . Hypertension Sister     Social History Social History   Tobacco Use  . Smoking status: Current Every Day Smoker    Packs/day: 0.50    Years: 42.00    Pack years: 21.00    Types: Cigarettes  . Smokeless tobacco: Never Used  Substance Use Topics  . Alcohol use: No  . Drug use: No   Allergies Nsaids and Ace inhibitors  Review of Systems Review of Systems  Unable to perform ROS: Intubated    Physical Exam Vital Signs  I have reviewed the triage vital signs BP 110/63   Pulse 68   Temp (!) 95.1 F (35.1 C) (Temporal)   Resp (!) 9   Ht '5\' 4"'  (1.626 m)   LMP 08/14/2010 (Exact Date)   SpO2 97%   BMI (P) 36.18 kg/m   Physical Exam  Constitutional: She appears well-developed and well-nourished. No distress.  HENT:  Head: Normocephalic and  atraumatic.  Nose: Nose normal.  Eyes: Pupils are equal, round, and reactive to light. Conjunctivae and EOM are normal. Right eye exhibits no discharge. Left eye exhibits no discharge. No scleral icterus.  Neck: Normal range of motion. Neck supple.  Cardiovascular: Normal rate and regular rhythm. Exam reveals no gallop and no friction rub.  No murmur heard. Pulmonary/Chest: Effort normal and breath sounds normal. No stridor. No respiratory distress. She has no rales.  Abdominal: Soft. She exhibits no distension. There is no tenderness.  Musculoskeletal: She exhibits no edema or tenderness.  Neurological: She is unresponsive.  Skin: Skin is warm and dry. No rash noted. She is not diaphoretic. No erythema.  Psychiatric: She has a normal mood and affect.  Vitals reviewed.   ED Results and Treatments Labs (all labs ordered are listed, but only abnormal results are displayed) Labs Reviewed  CBC WITH DIFFERENTIAL/PLATELET - Abnormal; Notable for the following components:      Result Value   WBC 11.5 (*)    RBC 3.14 (*)    Hemoglobin 9.7 (*)    HCT 31.8 (*)    MCV 101.3 (*)    RDW 18.9 (*)    Platelets 73 (*)    Neutro Abs 9.7 (*)    All other components within normal limits  COMPREHENSIVE METABOLIC PANEL - Abnormal; Notable for the following components:   Glucose, Bld 180 (*)    Calcium 8.5 (*)  Total Protein 5.8 (*)    Albumin 3.2 (*)    All other components within normal limits  I-STAT CHEM 8, ED - Abnormal; Notable for the following components:   BUN 5 (*)    Glucose, Bld 183 (*)    Hemoglobin 9.5 (*)    HCT 28.0 (*)    All other components within normal limits  PROTIME-INR                                                                                                                         EKG  EKG Interpretation  Date/Time:  Monday March 22 2018 04:24:06 EDT Ventricular Rate:  81 PR Interval:    QRS Duration: 89 QT Interval:  391 QTC Calculation: 454 R  Axis:   67 Text Interpretation:  Sinus rhythm Baseline wander in lead(s) II III aVL aVF V1 V2 V3 Otherwise no significant change Confirmed by Addison Lank [id number]) on 03/22/2018 5:02:09 AM      Radiology Ct Angio Head W Or Wo Contrast  Result Date: 03/22/2018 CLINICAL DATA:  Subarachnoid hemorrhage EXAM: CT ANGIOGRAPHY HEAD AND NECK TECHNIQUE: Multidetector CT imaging of the head and neck was performed using the standard protocol during bolus administration of intravenous contrast. Multiplanar CT image reconstructions and MIPs were obtained to evaluate the vascular anatomy. Carotid stenosis measurements (when applicable) are obtained utilizing NASCET criteria, using the distal internal carotid diameter as the denominator. CONTRAST:  49m ISOVUE-370 IOPAMIDOL (ISOVUE-370) INJECTION 76% COMPARISON:  Head CT 03/22/2018 and 12/24/2016 Cerebral angiogram 11/17/2017 FINDINGS: CTA NECK FINDINGS AORTIC ARCH: There is mild calcific atherosclerosis of the aortic arch. There is no aneurysm, dissection or hemodynamically significant stenosis of the visualized ascending aorta and aortic arch. Conventional 3 vessel aortic branching pattern. The visualized proximal subclavian arteries are widely patent. RIGHT CAROTID SYSTEM: --Common carotid artery: Widely patent origin without common carotid artery dissection or aneurysm. --Internal carotid artery: No dissection, occlusion or aneurysm. No hemodynamically significant stenosis. --External carotid artery: No acute abnormality. LEFT CAROTID SYSTEM: --Common carotid artery: Widely patent origin without common carotid artery dissection or aneurysm. --Internal carotid artery:No dissection, occlusion or aneurysm. No hemodynamically significant stenosis. --External carotid artery: No acute abnormality. VERTEBRAL ARTERIES: Codominant configuration. Both origins are normal. No dissection, occlusion or flow-limiting stenosis to the vertebrobasilar confluence. SKELETON: There is no bony  spinal canal stenosis. No lytic or blastic lesion. OTHER NECK: Normal pharynx, larynx and major salivary glands. No cervical lymphadenopathy. Unremarkable thyroid gland. UPPER CHEST: Endotracheal tube tip is just above the carina. There is bilateral upper lobe dependent atelectasis. CTA HEAD FINDINGS ANTERIOR CIRCULATION: --Intracranial internal carotid arteries: Flow diverting stent of the left internal carotid artery cavernous segment. No aneurysm filling at this site. Normal right ICA. --Anterior cerebral arteries: There is an inferiorly projecting aneurysm of the anterior communicating artery that measures 2 mm at its base and 3 mm base to apex. Both A1 segments are patent and normal caliber. The distal anterior cerebral arteries are normal. --  Middle cerebral arteries: There is a large coil mass at the site of the treated right MCA bifurcation aneurysm. Streak artifact obscures the area. There appears to be a focus of residual aneurysm filling measuring 2-3 mm (series 7, image 95). The left middle cerebral artery is normal. --Posterior communicating arteries: There is an inferiorly projecting aneurysm of the right P-comm origin that measures 3 mm at its base and 3 mm base to apex. The left P-comm is absent. POSTERIOR CIRCULATION: --Basilar artery: Normal. --Posterior cerebral arteries: Normal. --Superior cerebellar arteries: Normal. --Inferior cerebellar arteries: There is a coil mass at the site of a treated left PICA aneurysm. There appears to be a mild amount of residual filling along the medial aspect. VENOUS SINUSES: As permitted by contrast timing, patent. ANATOMIC VARIANTS: None DELAYED PHASE: No parenchymal contrast enhancement. Redemonstration of subarachnoid hemorrhage within the basal cisterns and sylvian fissures with intraventricular spread and associated communicating hydrocephalus. Volume of hemorrhage is increased slightly since the earlier study. Review of the MIP images confirms the above  findings. IMPRESSION: 1. Increased volume of subarachnoid hemorrhage predominantly located in the basal cisterns and sylvian fissures with intraventricular extension in a pattern most consistent with aneurysmal hemorrhage. It is not clear which aneurysm is the source of the hemorrhage, but the pattern seems to favor the left PICA as the most likely site. 2. Small focus of residual filling at the medial aspect of the treated left PICA aneurysm. 3. Treated large right MCA bifurcation aneurysm with a small focus of possible residual filling that is poorly visualized due to streak artifact from the coil mass. 4. Unchanged appearance of inferiorly projecting right P-comm aneurysm compared to the angiogram of 11/17/2017. 5. Unchanged appearance of inferiorly projecting A-comm aneurysm compared to cerebral angiogram of 11/17/2017. 6. Flow diverting stent at left ICA cavernous segment aneurysm site without residual filling. 7. No large vessel occlusion. Electronically Signed   By: Ulyses Jarred M.D.   On: 03/22/2018 06:49   Ct Angio Neck W And/or Wo Contrast  Result Date: 03/22/2018 CLINICAL DATA:  Subarachnoid hemorrhage EXAM: CT ANGIOGRAPHY HEAD AND NECK TECHNIQUE: Multidetector CT imaging of the head and neck was performed using the standard protocol during bolus administration of intravenous contrast. Multiplanar CT image reconstructions and MIPs were obtained to evaluate the vascular anatomy. Carotid stenosis measurements (when applicable) are obtained utilizing NASCET criteria, using the distal internal carotid diameter as the denominator. CONTRAST:  16m ISOVUE-370 IOPAMIDOL (ISOVUE-370) INJECTION 76% COMPARISON:  Head CT 03/22/2018 and 12/24/2016 Cerebral angiogram 11/17/2017 FINDINGS: CTA NECK FINDINGS AORTIC ARCH: There is mild calcific atherosclerosis of the aortic arch. There is no aneurysm, dissection or hemodynamically significant stenosis of the visualized ascending aorta and aortic arch. Conventional 3  vessel aortic branching pattern. The visualized proximal subclavian arteries are widely patent. RIGHT CAROTID SYSTEM: --Common carotid artery: Widely patent origin without common carotid artery dissection or aneurysm. --Internal carotid artery: No dissection, occlusion or aneurysm. No hemodynamically significant stenosis. --External carotid artery: No acute abnormality. LEFT CAROTID SYSTEM: --Common carotid artery: Widely patent origin without common carotid artery dissection or aneurysm. --Internal carotid artery:No dissection, occlusion or aneurysm. No hemodynamically significant stenosis. --External carotid artery: No acute abnormality. VERTEBRAL ARTERIES: Codominant configuration. Both origins are normal. No dissection, occlusion or flow-limiting stenosis to the vertebrobasilar confluence. SKELETON: There is no bony spinal canal stenosis. No lytic or blastic lesion. OTHER NECK: Normal pharynx, larynx and major salivary glands. No cervical lymphadenopathy. Unremarkable thyroid gland. UPPER CHEST: Endotracheal tube tip is just above  the carina. There is bilateral upper lobe dependent atelectasis. CTA HEAD FINDINGS ANTERIOR CIRCULATION: --Intracranial internal carotid arteries: Flow diverting stent of the left internal carotid artery cavernous segment. No aneurysm filling at this site. Normal right ICA. --Anterior cerebral arteries: There is an inferiorly projecting aneurysm of the anterior communicating artery that measures 2 mm at its base and 3 mm base to apex. Both A1 segments are patent and normal caliber. The distal anterior cerebral arteries are normal. --Middle cerebral arteries: There is a large coil mass at the site of the treated right MCA bifurcation aneurysm. Streak artifact obscures the area. There appears to be a focus of residual aneurysm filling measuring 2-3 mm (series 7, image 95). The left middle cerebral artery is normal. --Posterior communicating arteries: There is an inferiorly projecting  aneurysm of the right P-comm origin that measures 3 mm at its base and 3 mm base to apex. The left P-comm is absent. POSTERIOR CIRCULATION: --Basilar artery: Normal. --Posterior cerebral arteries: Normal. --Superior cerebellar arteries: Normal. --Inferior cerebellar arteries: There is a coil mass at the site of a treated left PICA aneurysm. There appears to be a mild amount of residual filling along the medial aspect. VENOUS SINUSES: As permitted by contrast timing, patent. ANATOMIC VARIANTS: None DELAYED PHASE: No parenchymal contrast enhancement. Redemonstration of subarachnoid hemorrhage within the basal cisterns and sylvian fissures with intraventricular spread and associated communicating hydrocephalus. Volume of hemorrhage is increased slightly since the earlier study. Review of the MIP images confirms the above findings. IMPRESSION: 1. Increased volume of subarachnoid hemorrhage predominantly located in the basal cisterns and sylvian fissures with intraventricular extension in a pattern most consistent with aneurysmal hemorrhage. It is not clear which aneurysm is the source of the hemorrhage, but the pattern seems to favor the left PICA as the most likely site. 2. Small focus of residual filling at the medial aspect of the treated left PICA aneurysm. 3. Treated large right MCA bifurcation aneurysm with a small focus of possible residual filling that is poorly visualized due to streak artifact from the coil mass. 4. Unchanged appearance of inferiorly projecting right P-comm aneurysm compared to the angiogram of 11/17/2017. 5. Unchanged appearance of inferiorly projecting A-comm aneurysm compared to cerebral angiogram of 11/17/2017. 6. Flow diverting stent at left ICA cavernous segment aneurysm site without residual filling. 7. No large vessel occlusion. Electronically Signed   By: Ulyses Jarred M.D.   On: 03/22/2018 06:49   Dg Chest Port 1 View  Result Date: 03/22/2018 CLINICAL DATA:  62 year old female  post intubation. Shortness of breath. Subsequent encounter. EXAM: PORTABLE CHEST 1 VIEW COMPARISON:  03/22/2018 2:06 a.m. FINDINGS: Endotracheal tube tip midline 2.5 cm above the carina. Right MediPort catheter tip mid superior vena cava level. Nasogastric tube courses below the diaphragm. Tip is not included on the present exam. Biapical pleural thickening.  No pneumothorax detected. Prominent mediastinum consistent with mediastinal adenopathy as noted on 02/26/2018 chest CT. Heart appears slightly prominent which may be related to AP magnification poor inspiration. Pulmonary vascular congestion.  No segmental consolidation. IMPRESSION: 1. Endotracheal tube tip 2.5 cm above the carina. 2. Progressive pulmonary vascular congestion when compared to exam earlier today. 3. Mediastinal adenopathy. Electronically Signed   By: Genia Del M.D.   On: 03/22/2018 07:12   Pertinent labs & imaging results that were available during my care of the patient were reviewed by me and considered in my medical decision making (see chart for details).  Medications Ordered in ED Medications  fentaNYL  2541mg in NS 2531m(1031mml) infusion-PREMIX (225 mcg/hr Intravenous Rate/Dose Change 03/22/18 0613)  fentaNYL (SUBLIMAZE) bolus via infusion 25 mcg (has no administration in time range)  midazolam (VERSED) injection 1 mg ( Intravenous Given 03/22/18 0508)  midazolam (VERSED) injection 1 mg (1 mg Intravenous Given 03/22/18 0611)  iopamidol (ISOVUE-370) 76 % injection (has no administration in time range)  niCARdipine in saline (CARDENE-IV) 20-0.86 MG/200ML-% infusion SOLN (has no administration in time range)  midazolam (VERSED) 50 mg in sodium chloride 0.9 % 50 mL (1 mg/mL) infusion (has no administration in time range)  midazolam (VERSED) bolus via infusion 1-2 mg (has no administration in time range)  fentaNYL (SUBLIMAZE) injection 50 mcg (50 mcg Intravenous Given 03/22/18 0453)  0.9 %  sodium chloride infusion ( Intravenous  New Bag/Given 03/22/18 0443)  nicardipine (CARDENE) 19m62m 0.86% saline 200ml19minfusion (0.1 mg/ml) (15 mg/hr Intravenous Rate/Dose Change 03/22/18 0527)  iopamidol (ISOVUE-370) 76 % injection 50 mL (50 mLs Intravenous Contrast Given 03/22/18 [phone]                                                                                                                                Procedures Procedures CRITICAL CARE Performed by: PedroGrayce Sessionsama Total critical care time: 35 minutes Critical care time was exclusive of separately billable procedures and treating other patients. Critical care was necessary to treat or prevent imminent or life-threatening deterioration. Critical care was time spent personally by me on the following activities: development of treatment plan with patient and/or surrogate as well as nursing, discussions with consultants, evaluation of patient's response to treatment, examination of patient, obtaining history from patient or surrogate, ordering and performing treatments and interventions, ordering and review of laboratory studies, ordering and review of radiographic studies, pulse oximetry and re-evaluation of patient's condition.   (including critical care time)  Medical Decision Making / ED Course I have reviewed the nursing notes for this encounter and the patient's prior records (if available in EHR or on provided paperwork).    Subarachnoid hemorrhage from aneurysmal bleed, intubated.  Initial blood pressures elevated with systolics in the 180s.415Atermittent sedation provided.  Patient placed on Cardene drip with a goal of systolic blood pressures in the 140s.  Plan from neurosurgery was to obtain a CT Angio of the head and neck.  Since chemistry panel had not resulted prior to transfer, will obtain labs here to assess renal function.    Dr. Gram Lonell Grandchild) evaluated the patient in the ED and based on comorbidities with likelihood of coagulopathy, they will not  surgically intervene at this time. They requested medical ICU admission for continued management. They will continue to follow.  Spoke with Dr. SommeOletta Darter ElinkSargeant team will evaluate in the ED.  Final Clinical Impression(s) / ED Diagnoses Final diagnoses:  SAH (subarachnoid hemorrhage) (HCC) [place]      This chart was dictated using  voice recognition software.  Despite best efforts to proofread,  errors can occur which can change the documentation meaning.     Fatima Blank, MD 03/22/18 [phone]

## 2018-03-22 NOTE — ED Provider Notes (Signed)
Signout from Dr. Leonette Monarch.  62-year-old female with subarachnoid hemorrhage intubated on sedation and nicardipine.  Neurosurgery planning no acute interventions.  Awaiting critical care interventionalist for admission.   Hayden Rasmussen, MD 03/23/18 1023

## 2018-03-22 NOTE — Progress Notes (Signed)
Patient transported to 4N18 from the ED without any apparent complications.

## 2018-03-22 NOTE — ED Triage Notes (Signed)
Pt comes via [place] from [facility], pt woke up around 1 am with sudden onset of headache and blurred vision, stopped at [facility] due to patient going unresponsive and need for intubation. Ct scans showed bleed. En route pt received 100 mcg fentanyl

## 2018-03-22 NOTE — Procedures (Signed)
ELECTROENCEPHALOGRAM REPORT   Patient: Diane Short       Room #: 7V72Q EEG No. ID: [id number] Age: 62 y.o.        Sex: female Referring Physician: Halford Chessman Report Date:  03/22/2018        Interpreting Physician: Alexis Goodell  History: Diane Short is an 62 y.o. female presenting after being unresponsive.  Found to have subarachnoid hemoorhage  Medications:  Colace, Insulin, Cardene, Nimotop, Protonix, Precedex, Levophed  Conditions of Recording:  This is a 21 channel routine scalp EEG performed with bipolar and monopolar montages arranged in accordance to the international 10/20 system of electrode placement. One channel was dedicated to EKG recording.  The patient is in the intubated and sedated state.  Description:  The background activity is slow and disorganized.  It consists of a mixture of poorly organized, intermixed theta and delta activity.  This activity seems more pronounced over the right temporal region due to the persistent increased voltage noted in this area.  Voltage otherwise is low. The patient is stimulated during the recording with no change in background rhythm noted.   No epileptiform activity is noted.  Hyperventilation and intermittent photic stimulation were not performed.  IMPRESSION: This is an abnormal EEG secondary to general background slowing.  This finding may be seen with a diffuse disturbance that is etiologically nonspecific, but may include a metabolic encephalopathy, among other possibilities.  This activity is more pronounced and of higher voltage over the right temporal region and may suggest a focal abnormality in that area.  Clinical correlation is recommended.  No epileptiform activity was noted.     Alexis Goodell, MD Neurology [phone] 03/22/2018, 3:57 PM

## 2018-03-22 NOTE — Progress Notes (Signed)
Initial Nutrition Assessment  DOCUMENTATION CODES:   Obesity unspecified  INTERVENTION:   When able to begin TF:  Vital High Protein at 50 ml/h (1200 ml per day)  Provides 1200 kcal, 105 gm protein, 1003 ml free water daily  NUTRITION DIAGNOSIS:   Inadequate oral intake related to inability to eat as evidenced by NPO status.  GOAL:   Provide needs based on ASPEN/SCCM guidelines  MONITOR:   Vent status, TF tolerance, Labs, I & O's  REASON FOR ASSESSMENT:   Ventilator    ASSESSMENT:   62 yo female with PMH of HTN, GERD, hiatal hernia, breast cancer with brain mets (receiving chemo & XRT), and osteonecrosis of R jaw bone who was admitted on 9/9 with SAH.  Patient is currently intubated on ventilator support MV: 9.3 L/min Temp (24hrs), Avg:99.2 F (37.3 C), Min:95.1 F (35.1 C), Max:100.2 F (37.9 C)   Labs reviewed. CBG's: 144-130 Medications reviewed and include Colace, fentanyl, Precedex, Levophed.  Per discussion with CCM team, no plans to begin TF today, but will likely be ready for feedings tomorrow.     NUTRITION - FOCUSED PHYSICAL EXAM:    Most Recent Value  Orbital Region  No depletion  Upper Arm Region  No depletion  Thoracic and Lumbar Region  Unable to assess  Buccal Region  Unable to assess  Temple Region  No depletion  Clavicle Bone Region  No depletion  Clavicle and Acromion Bone Region  No depletion  Scapular Bone Region  Unable to assess  Dorsal Hand  No depletion  Patellar Region  No depletion  Anterior Thigh Region  No depletion  Posterior Calf Region  No depletion  Edema (RD Assessment)  None  Hair  Reviewed  Eyes  Unable to assess  Mouth  Unable to assess  Skin  Reviewed  Nails  Reviewed       Diet Order:   Diet Order            Diet NPO time specified  Diet effective now              EDUCATION NEEDS:   No education needs have been identified at this time  Skin:  Skin Assessment: Reviewed RN Assessment  Last  BM:  unknown  Height:   Ht Readings from Last 1 Encounters:  03/22/18 5\' 4"  (1.626 m)    Weight:   Wt Readings from Last 1 Encounters:  03/22/18 90.7 kg    Ideal Body Weight:  54.5 kg  BMI:  Body mass index is 34.33 kg/m.  Estimated Nutritional Needs:   Kcal:  1000-1270  Protein:  109 gm  Fluid:  >/= 1.5 L    Molli Barrows, RD, LDN, [place] Pager [phone] After Hours Pager [phone]

## 2018-03-22 NOTE — Progress Notes (Addendum)
RT Note: I stat machine will not cross over results:  ABG drawn on 460-22-40% +5.  RR  Temp. 95.6  Results given to receiving RT.

## 2018-03-22 NOTE — Progress Notes (Signed)
NEUROSURGERY PROGRESS NOTE   Pts admission history reviewed. Spoke with Dr. Saintclair Halsted. Briefly, patient has history of metastatic breast cancer, and 5 known intracranial aneurysms.  Over the course of the last year she has undergone treatment of a large right middle cerebral artery aneurysm, a left carotid aneurysm, and a left PICA aneurysm.  More recently she was diagnosed with a solitary brain metastasis which was treated with stereotactic radiosurgery.  [facility] after she woke up and thought she had had a stroke.  By the time she got to outside hospital she required intubation for airway protection and was transferred here.  She has required pressors for blood pressure and heart rate.  She is actively on chemotherapy for treatment of her metastatic breast cancer.  She recently underwent transfusion for anemia.  EXAM:  BP 136/76   Pulse 80   Temp (!) 101.3 F (38.5 C)   Resp (!) 28   Ht 5\' 4"  (1.626 m)   Wt 90.7 kg   LMP 08/14/2010 (Exact Date)   SpO2 100%   BMI 34.33 kg/m   Intubated, sedated on precedex. Per RN she was moving BUE purposefully, localizing. No significant movements of BLE Pupils 32mm reactive Breathing over vent  IMAGING: Noncontrast head CT demonstrates basal subarachnoid hemorrhage.  Appears slightly eccentric to the left along the ventral surface of the posterior fossa.  There is also intraventricular extension.  There is mild ventriculomegaly.  CT angiogram was reviewed and compared to most recent catheter based angiogram of May 2019.  This again demonstrates small approximately 2 millimeter aneurysms in the right posterior communicating artery region and the anterior communicating artery region.  No significant aneurysm is seen in the right middle cerebral artery.  I do not see any real filling of the left ophthalmic aneurysm.  It is difficult to make out if there is significant aneurysm residual of the left pica.  IMPRESSION:  62  y.o. female with complicated medical history including actively treated metastatic breast cancer and history of multiple intracranial aneurysms.  She presents with Hunt-Hess Grade 4 subarachnoid hemorrhage.  she is requiring vasopressors for hypotension and bradycardia.  There is also mild ventriculomegaly however she has significant thrombocytopenia due to her chemotherapy.  From a neurosurgical perspective, I think the most pressing matter is whether not she requires CSF diversion for hydrocephalus.  At this point she has thrombocytopenic and I  think placement of a ventriculostomy would require platelet transfusion.  It is also unclear exactly which of her aneurysms may have ruptured to cause a subarachnoid hemorrhage.  Based on the location and pattern of bleeding I suspect the most likely culprit is the posterior inferior cerebellar artery aneurysm which on most recent angiogram was shown to have a small residual.  PLAN: - With her somewhat unstable hemodynamic condition, I will plan on repeating CT scan of the head without contrast tomorrow morning to determine if there is progressive hydrocephalus.  In this case we will plan on platelet transfusion and placement of ventriculostomy. - Based on her clinical condition tomorrow we can decide if it is reasonable to proceed with diagnostic angiogram and potential endovascular treatment of the posterior circulation aneurysm residual if feasible.  Given her condition, I do not think she would be a candidate for open surgical clipping of any of the aneurysms.  Alternatively, we could manage her medically, and if she does make some neurologic recovery  consider treating her aneurysm on a delayed basis.  I did review the situation with the patient's husband and other family.  This is obviously a very difficult situation.  I recommended the treatment course above taking into account  her age and underlying medical comorbidities.  I explained to them that she may  need ventriculostomy.  There is also risk of rehemorrhage if we elect to attempt to treat her aneurysm(s) in a delayed fashion.  We discussed the possibility that she may not make any meaningful neurologic recovery in which case, especially given her metastatic cancer, we would need to seriously consider our goals of care. Family seems to understand the situation. All their questions were answered and they are agreeable to the plan above.

## 2018-03-22 NOTE — H&P (Signed)
Diane Short  KGM:[id number] DOB: Oct 26, 1955 DOA: 03/22/2018 PCP: Burnard Bunting, MD    LOS: 0 days   Reason for Consult / Chief Complaint:  Headache  Consulting MD and date of consult:  Dr. Leonette Monarch, ER 03/22/2018  HPI/summary of hospital stay:  Hx from chart and family.  62 yo female with hx of Stage 4 breast cancer on chemo with XRT to brain met, 5 cerebral aneurysms with tx for 3 had persistent headache.  She went to bed on 03/21/18.  Husband noted her making grunting noises around 1 am on 03/22/18.  She got up to use the bathroom and then passed out.  She was brought to [facility] ER.  Intubated for airway.  CT head should SAH.  She was transferred to [facility] and seen by neurosurgery.  Subjective  Pt unable to provide information.  Assessment & Plan:   Subarachnoid hemorrhage. - consult neurology - seen by neurosurgery in ER  Acute respiratory failure with hypoxia, hypercapnia. Tobacco abuse. - full vent support - f/u CXR, ABG - scheduled BDs  Stage 4 Breast cancer with mets to brain. - followed by oncology in [place]  History of hypertension. Edema. - lasix 40 mg IV x 1  Hyperglycemia. - SSI  Anemia, thrombocytopenia. - likely from chronic disease and chemotherapy - f/u CBC    Best Practice / Goals of Care / Disposition.   DVT prophylaxis: SCDs GI prophylaxis: Protonis Diet: NPO Mobility: bed rest Code Status: full code Family Communication: updated on 03/22/18  Disposition / Summary of Today's Plan 03/22/18   Admit to neuro ICU, consult neurology, neurosurgery seen pt in ER, adjust sedation and f/u ABG.  Consultants: date of consult/date signed off (if applicable)/final recs  Neurosurgery 9/09 >> Neurology 9/09 >>  Procedures: Rt port >>  ETT 9/09 >>   Significant Diagnostic Tests: CT head 9/09 >> SAH, mild hydrocephalus  Micro Data:  Antimicrobials:     Objective    Examination:  General - sedated Eyes - pupils pinpoint ENT -  ETT in place, edema around neck Cardiac - regular rate/rhythm, no murmur Chest - b/l expiratory wheeze and faint crackles, s/p mastectomy, port in Rt chest Abdomen - soft, non tender, + bowel sounds, no hepatosplenomegaly GU - no lesions Extremities - 1+ edema Skin - no rashes Lymphatics - no lymphadenopathy Neuro - not following commands    Blood pressure 108/80, pulse 79, temperature (!) 95.1 F (35.1 C), temperature source Temporal, resp. rate (!) 9, height '5\' 4"'  (1.626 m), last menstrual period 08/14/2010, SpO2 97 %.    Vent Mode: PRVC FiO2 (%):  [100 %] 100 % Set Rate:  [18 bmp] 18 bmp Vt Set:  [440 mL] 440 mL PEEP:  [5 cmH20] 5 cmH20 Plateau Pressure:  [22 cmH20] 22 cmH20  No intake or output data in the 24 hours ending 03/22/18 0806 There were no vitals filed for this visit.   Labs    CBC: Recent Labs  Lab 03/22/18 0539 03/22/18 0546  WBC 11.5*  --   NEUTROABS 9.7*  --   HGB 9.7* 9.5*  HCT 31.8* 28.0*  MCV 101.3*  --   PLT 73*  --    Basic Metabolic Panel: Recent Labs  Lab 03/22/18 0539 03/22/18 0546  NA 140 139  K 3.6 3.5  CL 105 103  CO2 25  --   GLUCOSE 180* 183*  BUN 6 5*  CREATININE 0.76 0.60  CALCIUM 8.5*  --  GFR: CrCl cannot be calculated (Unknown ideal weight.). Recent Labs  Lab 03/22/18 0539  WBC 11.5*   Liver Function Tests: Recent Labs  Lab 03/22/18 0539  AST 33  ALT 35  ALKPHOS 76  BILITOT 0.5  PROT 5.8*  ALBUMIN 3.2*   No results for input(s): LIPASE, AMYLASE in the last 168 hours. No results for input(s): AMMONIA in the last 168 hours. ABG    Component Value Date/Time   PHART 7.320 (L) 03/22/2018 0700   PCO2ART 49.0 (H) 03/22/2018 0700   PO2ART 80.0 (L) 03/22/2018 0700   HCO3 25.8 03/22/2018 0700   TCO2 27 03/22/2018 0700   ACIDBASEDEF 1.0 03/22/2018 0700   O2SAT 96.0 03/22/2018 0700    Coagulation Profile: No results for input(s): INR, PROTIME in the last 168 hours. Cardiac Enzymes: No results for  input(s): CKTOTAL, CKMB, CKMBINDEX, TROPONINI in the last 168 hours. HbA1C: No results found for: HGBA1C CBG: No results for input(s): GLUCAP in the last 168 hours.   Review of Systems:     Past medical history  She,  has a past medical history of Anemia, Anxiety, Arthritis, Cancer ([place]), Cerebral hemorrhage ([place]) (1986), Depression, Diverticulosis, GERD (gastroesophageal reflux disease), Headache, History of hiatal hernia, Hypertension, Neuropathy, Neutropenic fever ([place]), Osteonecrosis ([place]) (2017-2018), and Thrush, oral.   Surgical History    Past Surgical History:  Procedure Laterality Date  . BREAST SURGERY Left 11/2016   metastic to bone  . CATARACT EXTRACTION Right 03/02/2017  . CEREBRAL ANGIOGRAM  1986  . CESAREAN SECTION  1980  . IR 3D INDEPENDENT WKST  01/20/2017  . IR 3D INDEPENDENT WKST  05/26/2017  . IR ANGIO INTRA EXTRACRAN SEL COM CAROTID INNOMINATE UNI R MOD SED  04/09/2017  . IR ANGIO INTRA EXTRACRAN SEL INTERNAL CAROTID BILAT MOD SED  01/20/2017  . IR ANGIO INTRA EXTRACRAN SEL INTERNAL CAROTID BILAT MOD SED  11/17/2017  . IR ANGIO INTRA EXTRACRAN SEL INTERNAL CAROTID UNI R MOD SED  03/13/2017  . IR ANGIO VERTEBRAL SEL VERTEBRAL BILAT MOD SED  01/20/2017  . IR ANGIO VERTEBRAL SEL VERTEBRAL UNI L MOD SED  05/26/2017  . IR ANGIO VERTEBRAL SEL VERTEBRAL UNI L MOD SED  11/17/2017  . IR ANGIOGRAM FOLLOW UP STUDY  03/13/2017  . IR ANGIOGRAM FOLLOW UP STUDY  03/13/2017  . IR ANGIOGRAM FOLLOW UP STUDY  03/13/2017  . IR ANGIOGRAM FOLLOW UP STUDY  03/13/2017  . IR ANGIOGRAM FOLLOW UP STUDY  03/13/2017  . IR ANGIOGRAM FOLLOW UP STUDY  03/13/2017  . IR ANGIOGRAM FOLLOW UP STUDY  03/13/2017  . IR ANGIOGRAM FOLLOW UP STUDY  03/13/2017  . IR ANGIOGRAM FOLLOW UP STUDY  03/13/2017  . IR ANGIOGRAM FOLLOW UP STUDY  03/13/2017  . IR ANGIOGRAM FOLLOW UP STUDY  03/13/2017  . IR ANGIOGRAM FOLLOW UP STUDY  03/13/2017  . IR ANGIOGRAM FOLLOW UP STUDY  04/09/2017  . IR ANGIOGRAM FOLLOW UP STUDY   05/26/2017  . IR ANGIOGRAM FOLLOW UP STUDY  05/26/2017  . IR NEURO EACH ADD'L AFTER BASIC UNI LEFT (MS)  04/09/2017  . IR NEURO EACH ADD'L AFTER BASIC UNI RIGHT (MS)  03/13/2017  . IR TRANSCATH/EMBOLIZ  03/13/2017  . IR TRANSCATH/EMBOLIZ  04/09/2017  . IR TRANSCATH/EMBOLIZ  05/26/2017  . IR US GUIDE VASC ACCESS RIGHT  04/09/2017  . MASTECTOMY Left   . port a cathh  04/2016  . RADIOLOGY WITH ANESTHESIA N/A 03/13/2017   Procedure: stent coiling of right MCA aneurysm;  Surgeon: Consuella Lose,  MD;  Location: [facility];  Service: Radiology;  Laterality: N/A;  . RADIOLOGY WITH ANESTHESIA N/A 04/09/2017   Procedure: Arteriogram, pipeline embolization of aneruysm;  Surgeon: Consuella Lose, MD;  Location: [facility];  Service: Radiology;  Laterality: N/A;  . RADIOLOGY WITH ANESTHESIA N/A 05/26/2017   Procedure: Stent supported coil embolization;  Surgeon: Consuella Lose, MD;  Location: [facility];  Service: Radiology;  Laterality: N/A;  . TUBAL LIGATION  1983     Social History   Social History   Socioeconomic History  . Marital status: Married    Spouse name: Not on file  . Number of children: Not on file  . Years of education: Not on file  . Highest education level: Not on file  Occupational History  . Not on file  Social Needs  . Financial resource strain: Not on file  . Food insecurity:    Worry: Not on file    Inability: Not on file  . Transportation needs:    Medical: Not on file    Non-medical: Not on file  Tobacco Use  . Smoking status: Current Every Day Smoker    Packs/day: 0.50    Years: 42.00    Pack years: 21.00    Types: Cigarettes  . Smokeless tobacco: Never Used  Substance and Sexual Activity  . Alcohol use: No  . Drug use: No  . Sexual activity: Not on file  Lifestyle  . Physical activity:    Days per week: Not on file    Minutes per session: Not on file  . Stress: Not on file  Relationships  . Social connections:    Talks on phone: Not on file    Gets  together: Not on file    Attends religious service: Not on file    Active member of club or organization: Not on file    Attends meetings of clubs or organizations: Not on file    Relationship status: Not on file  . Intimate partner violence:    Fear of current or ex partner: No    Emotionally abused: No    Physically abused: No    Forced sexual activity: No  Other Topics Concern  . Not on file  Social History Narrative  . Not on file  ,  reports that she has been smoking cigarettes. She has a 21.00 pack-year smoking history. She has never used smokeless tobacco. She reports that she does not drink alcohol or use drugs.   Family history   Her family history includes Alzheimer's disease in her mother; Cerebral aneurysm in her father; Colon cancer in her father; Diabetes in her sister; Esophageal cancer in her brother; Hypertension in her sister; Multiple myeloma in her brother; Stomach cancer in her brother.   Allergies Allergies  Allergen Reactions  . Nsaids Other (See Comments)    GI upset   . Ace Inhibitors Other (See Comments)    cough    Home meds  Prior to Admission medications   Medication Sig Start Date End Date Taking? Authorizing Provider  acetaminophen (TYLENOL) 500 MG tablet Take 1,000 mg by mouth 2 (two) times daily as needed for mild pain.    Yes [provider]  aspirin 81 MG tablet Take 81 mg by mouth daily.    Yes [provider]  Calcium Citrate-Vitamin D (CALCIUM CITRATE + D3 PO) Take 1-2 tablets by mouth 2 (two) times daily. Takes 2 in the am and 1 in the evening   Yes [provider]  carvedilol (COREG) 25 MG tablet Take 25 mg by mouth 2 (two) times daily with a meal.   Yes [provider]  citalopram (CELEXA) 20 MG tablet Take 20 mg by mouth daily.   Yes [provider]  diphenhydrAMINE (BENADRYL) 25 MG tablet Take 25 mg by mouth at bedtime as needed for sleep.   Yes [provider]  loperamide (IMODIUM  A-D) 2 MG tablet Take 2-4 mg by mouth daily as needed for diarrhea or loose stools.   Yes [provider]  loratadine (CLARITIN) 10 MG tablet Take 10 mg by mouth daily.   Yes [provider]  LORazepam (ATIVAN) 0.5 MG tablet Take 0.5 mg by mouth every 4 (four) hours as needed for anxiety.   Yes [provider]  omeprazole (PRILOSEC) 20 MG capsule Take 20 mg by mouth daily.   Yes [provider]  ondansetron (ZOFRAN) 4 MG tablet Take 4 mg by mouth every 4 (four) hours as needed for nausea or vomiting.   Yes [provider]  prochlorperazine (COMPAZINE) 10 MG tablet Take 10 mg by mouth every 6 (six) hours as needed for nausea or vomiting.   Yes [provider]  traMADol (ULTRAM) 50 MG tablet Take 50 mg by mouth every 6 (six) hours as needed for moderate pain.   Yes [provider]     CC time 43 minutes  Chesley Mires, MD [place] 03/22/2018, 8:06 AM

## 2018-03-22 NOTE — Progress Notes (Signed)
EEG completed, results pending. 

## 2018-03-22 NOTE — ED Notes (Signed)
Pt has swelling to neck area

## 2018-03-23 ENCOUNTER — Inpatient Hospital Stay (HOSPITAL_COMMUNITY): Payer: Medicaid Other

## 2018-03-23 ENCOUNTER — Encounter (HOSPITAL_COMMUNITY)
Admission: EM | Disposition: A | Discharge status: Rehabilitation Facility | Payer: Self-pay | Source: Other Acute Inpatient Hospital | Attending: Pulmonary Disease

## 2018-03-23 ENCOUNTER — Inpatient Hospital Stay (HOSPITAL_COMMUNITY): Payer: Medicaid Other | Admitting: Anesthesiology

## 2018-03-23 ENCOUNTER — Other Ambulatory Visit: Payer: Self-pay

## 2018-03-23 ENCOUNTER — Encounter (HOSPITAL_COMMUNITY): Payer: Self-pay | Admitting: Anesthesiology

## 2018-03-23 DIAGNOSIS — R0600 Dyspnea, unspecified: Secondary | ICD-10-CM

## 2018-03-23 DIAGNOSIS — T451X5A Adverse effect of antineoplastic and immunosuppressive drugs, initial encounter: Secondary | ICD-10-CM

## 2018-03-23 DIAGNOSIS — J988 Other specified respiratory disorders: Secondary | ICD-10-CM

## 2018-03-23 DIAGNOSIS — D6959 Other secondary thrombocytopenia: Secondary | ICD-10-CM

## 2018-03-23 HISTORY — PX: RADIOLOGY WITH ANESTHESIA: SHX6223

## 2018-03-23 HISTORY — PX: IR NEURO EACH ADD'L AFTER BASIC UNI LEFT (MS): IMG5373

## 2018-03-23 HISTORY — PX: IR ANGIOGRAM FOLLOW UP STUDY: IMG697

## 2018-03-23 HISTORY — PX: IR ANGIO INTRA EXTRACRAN SEL INTERNAL CAROTID BILAT MOD SED: IMG5363

## 2018-03-23 HISTORY — PX: IR TRANSCATH/EMBOLIZ: IMG695

## 2018-03-23 LAB — GLUCOSE, CAPILLARY
GLUCOSE-CAPILLARY: 123 mg/dL — AB (ref 70–99)
GLUCOSE-CAPILLARY: 116 mg/dL — AB (ref 70–99)
Glucose-Capillary: 110 mg/dL — ABNORMAL HIGH (ref 70–99)
Glucose-Capillary: 109 mg/dL — ABNORMAL HIGH (ref 70–99)
Glucose-Capillary: 118 mg/dL — ABNORMAL HIGH (ref 70–99)

## 2018-03-23 LAB — BASIC METABOLIC PANEL
Anion gap: 10 (ref 5–15)
BUN: 8 mg/dL (ref 6–20)
CALCIUM: 8.1 mg/dL — AB (ref 8.9–10.3)
CHLORIDE: 109 mmol/L (ref 98–111)
CO2: 21 mmol/L — AB (ref 22–32)
CREATININE: 0.93 mg/dL (ref 0.44–1.00)
GFR calc non Af Amer: >60 mL/min (ref >60)
GLUCOSE: 117 mg/dL — AB (ref 70–99)
Potassium: 3.7 mmol/L (ref 3.5–5.1)
Sodium: 140 mmol/L (ref 135–145)

## 2018-03-23 LAB — CBC
HCT: 26.7 % — ABNORMAL LOW (ref 36.0–46.0)
Hemoglobin: 8.3 g/dL — ABNORMAL LOW (ref 12.0–15.0)
MCH: 30.7 pg (ref 26.0–34.0)
MCHC: 31.1 g/dL (ref 30.0–36.0)
MCV: 98.9 fL (ref 78.0–100.0)
PLATELETS: 38 10*3/uL — AB (ref 150–400)
RBC: 2.7 MIL/uL — ABNORMAL LOW (ref 3.87–5.11)
RDW: 18.8 % — ABNORMAL HIGH (ref 11.5–15.5)
WBC: 8.7 10*3/uL (ref 4.0–10.5)

## 2018-03-23 LAB — MAGNESIUM
MAGNESIUM: 1.4 mg/dL — AB (ref 1.7–2.4)
MAGNESIUM: 2.2 mg/dL (ref 1.7–2.4)
MAGNESIUM: 1.9 mg/dL (ref 1.7–2.4)

## 2018-03-23 LAB — POCT I-STAT 3, ART BLOOD GAS (G3+)
Acid-base deficit: 3 mmol/L — ABNORMAL HIGH (ref 0.0–2.0)
Bicarbonate: 21.3 mmol/L (ref 20.0–28.0)
O2 SAT: 94 %
PH ART: 7.368 (ref 7.350–7.450)
Patient temperature: 99.7
TCO2: 22 mmol/L (ref 22–32)
pCO2 arterial: 37.2 mmHg (ref 32.0–48.0)
pO2, Arterial: 76 mmHg — ABNORMAL LOW (ref 83.0–108.0)

## 2018-03-23 LAB — ECHOCARDIOGRAM COMPLETE
Height: 64 in
Weight: 3597.91 oz

## 2018-03-23 LAB — PHOSPHORUS
PHOSPHORUS: 2.8 mg/dL (ref 2.5–4.6)
PHOSPHORUS: 2.8 mg/dL (ref 2.5–4.6)
Phosphorus: 3.2 mg/dL (ref 2.5–4.6)

## 2018-03-23 SURGERY — IR WITH ANESTHESIA
Anesthesia: General

## 2018-03-23 MED ORDER — SODIUM CHLORIDE 0.9 % IV SOLN
INTRAVENOUS | Status: DC | PRN
  Administered 2018-03-23: 15 ug/min via INTRAVENOUS

## 2018-03-23 MED ORDER — IOHEXOL 300 MG/ML  SOLN
80.0000 mL | Freq: Once | INTRAMUSCULAR | Status: AC | PRN
  Administered 2018-03-23: 80 mL via INTRA_ARTERIAL

## 2018-03-23 MED ORDER — MAGNESIUM SULFATE 2 GM/50ML IV SOLN
2.0000 g | Freq: Once | INTRAVENOUS | Status: AC
  Administered 2018-03-23: 2 g via INTRAVENOUS
  Filled 2018-03-23: qty 50

## 2018-03-23 MED ORDER — SODIUM CHLORIDE 0.9 % IV SOLN
INTRAVENOUS | Status: DC | PRN
  Administered 2018-03-23: 175 ug/h via INTRAVENOUS

## 2018-03-23 MED ORDER — VITAL HIGH PROTEIN PO LIQD
1000.0000 mL | ORAL | Status: DC
  Administered 2018-03-23: 1000 mL

## 2018-03-23 MED ORDER — ROCURONIUM BROMIDE 100 MG/10ML IV SOLN
INTRAVENOUS | Status: DC | PRN
  Administered 2018-03-23 (×2): 10 mg via INTRAVENOUS
  Administered 2018-03-23: 30 mg via INTRAVENOUS

## 2018-03-23 MED ORDER — PROPOFOL 10 MG/ML IV BOLUS
INTRAVENOUS | Status: DC | PRN
  Administered 2018-03-23: 75 mg via INTRAVENOUS

## 2018-03-23 MED ORDER — VITAL HIGH PROTEIN PO LIQD: 1000.0000 mL | ORAL | Status: DC

## 2018-03-23 MED ORDER — INFLUENZA VAC SPLIT QUAD 0.5 ML IM SUSY
0.5000 mL | PREFILLED_SYRINGE | INTRAMUSCULAR | Status: AC
  Administered 2018-03-24: 0.5 mL via INTRAMUSCULAR
  Filled 2018-03-23: qty 0.5

## 2018-03-23 MED ORDER — CHLORHEXIDINE GLUCONATE CLOTH 2 % EX PADS
6.0000 | MEDICATED_PAD | Freq: Every day | CUTANEOUS | Status: DC
  Administered 2018-03-23 – 2018-04-16 (×20): 6 via TOPICAL

## 2018-03-23 MED ORDER — PRO-STAT SUGAR FREE PO LIQD
30.0000 mL | Freq: Two times a day (BID) | ORAL | Status: DC
  Administered 2018-03-23: 30 mL
  Filled 2018-03-23: qty 30

## 2018-03-23 MED ORDER — ONDANSETRON HCL 4 MG/2ML IJ SOLN
INTRAMUSCULAR | Status: DC | PRN
  Administered 2018-03-23: 4 mg via INTRAVENOUS

## 2018-03-23 MED ORDER — SODIUM CHLORIDE 0.9% FLUSH
10.0000 mL | Freq: Two times a day (BID) | INTRAVENOUS | Status: DC
  Administered 2018-03-23 – 2018-03-26 (×7): 10 mL
  Administered 2018-03-26: 40 mL
  Administered 2018-03-27: 10 mL
  Administered 2018-03-27: 40 mL
  Administered 2018-03-28 – 2018-04-07 (×15): 10 mL
  Administered 2018-04-08: 20 mL
  Administered 2018-04-08 – 2018-04-12 (×9): 10 mL
  Administered 2018-04-13: 20 mL
  Administered 2018-04-13: 30 mL
  Administered 2018-04-14: 10 mL
  Administered 2018-04-14: 30 mL
  Administered 2018-04-15 – 2018-04-16 (×3): 10 mL

## 2018-03-23 MED ORDER — POTASSIUM CHLORIDE 20 MEQ/15ML (10%) PO SOLN
20.0000 meq | Freq: Once | ORAL | Status: AC
  Administered 2018-03-23: 20 meq
  Filled 2018-03-23: qty 15

## 2018-03-23 MED ORDER — DEXMEDETOMIDINE HCL IN NACL 200 MCG/50ML IV SOLN
INTRAVENOUS | Status: AC
  Administered 2018-03-23: 18:00:00
  Filled 2018-03-23: qty 50

## 2018-03-23 NOTE — Anesthesia Preprocedure Evaluation (Signed)
Anesthesia Evaluation  Patient identified by MRN, date of birth, ID band Patient unresponsive    Reviewed: Allergy & Precautions, NPO status , Patient's Chart, lab work & pertinent test results  Airway Mallampati: Intubated       Dental   Pulmonary Current Smoker,    + rhonchi        Cardiovascular hypertension, + Peripheral Vascular Disease   Rhythm:Regular Rate:Normal     Neuro/Psych    GI/Hepatic hiatal hernia, GERD  ,  Endo/Other    Renal/GU      Musculoskeletal  (+) Arthritis ,   Abdominal   Peds  Hematology  (+) anemia ,   Anesthesia Other Findings   Reproductive/Obstetrics                             Anesthesia Physical Anesthesia Plan  ASA: IV  Anesthesia Plan: General   Post-op Pain Management:    Induction: Intravenous  PONV Risk Score and Plan: Treatment may vary due to age or medical condition  Airway Management Planned: Oral ETT  Additional Equipment:   Intra-op Plan:   Post-operative Plan: Post-operative intubation/ventilation  Informed Consent:   Plan Discussed with: CRNA, Anesthesiologist and Surgeon  Anesthesia Plan Comments:         Anesthesia Quick Evaluation

## 2018-03-23 NOTE — Sedation Documentation (Signed)
Right groin sheath removed. 7Fr. Exoseal applied.

## 2018-03-23 NOTE — Progress Notes (Signed)
  NEUROSURGERY PROGRESS NOTE   No issues overnight.   EXAM:  BP 108/77   Pulse 72   Temp 99.3 F (37.4 C)   Resp (!) 29   Ht 5\' 4"  (1.626 m)   Wt 102 kg   LMP 08/14/2010 (Exact Date)   SpO2 99%   BMI 38.60 kg/m   Opens eyes spontaneously Breathing over vent Nods to questions Following commands briskly BUE/BLE  IMAGING: CT head this am reviewed and compared to yesterday. Demonstrates stable SAH/IVH with stable ventricular size.  IMPRESSION:  62 y.o. female SAHd# 1 with hx of metastatic breast CA on chemo currently. Appears to have improved neurologically, no clinical or radiographic evidence of HCP to necessitate CSF diversion. - Unclear which aneurysm has bled, however suspect it is the left PICA: pos fossa hematoma is centered around this region, and of the four anterior circulation aneurysms, 2 are treated essentially completely while two are quite small and have regular morphology. The PICA aneurysm was shown to have a residual on most recent angiogram. - With her neurologic improvement, I do think that proceeding the angiogram for possible treatment of the left PICA aneurysm is reasonable. Obviously, if the aneurysm is not treatable through endovascular means I do not think she is a candidate for open surgical clipping given her underlying medical condition and active chemo with thrombocytopenia.  PLAN: - Will proceed with angiogram, possible PICA coiling  I have reviewed the CT findings and the plan above with the patient's husband and family. All their questions were answered, and they are agreeable to proceed. Consent for the procedure was obtained. The patient's husband is very aware of the details of the procedure and its risks as he has been present with the patient for all three previous endovascular treatments.

## 2018-03-23 NOTE — Brief Op Note (Signed)
  NEUROSURGERY BRIEF OP NOTE   PREOP DX: Subarachnoid Hemorrhage  POSTOP DX: Same  PROCEDURE: Diagnostic cerebral angiogram, coil embolization of left PICA aneurysm  SURGEON: Dr. Consuella Lose, MD  ANESTHESIA: GETA  EBL: Minimal  SPECIMENS: None  COMPLICATIONS: None  CONDITION: Stable to recovery  FINDINGS (See full report in [place]): 1. Further recanalization of left PICA aneurysm since angiogram of May 2019, now measuring 3.6 x 5.2 mm, successfully coiled. 2. Stable appearance of previously treated right MCA aneurysm, without aneurysm recurrence or residual 3. Miniscule filling of previously treated left ICA aneurysm with Pipeline device, stable in comparison to prior angiogram of May 2019. 4. Stable appearance of small right Pcom and Acom aneurysms from May 2019.

## 2018-03-23 NOTE — Progress Notes (Signed)
Diane Short  SFK:[id number] DOB: 02-Mar-1956 DOA: 03/22/2018 PCP: Burnard Bunting, MD    LOS: 1 day   Reason for Consult / Chief Complaint:  Headache  Consulting MD and date of consult:  Dr. Leonette Monarch, ER 03/22/2018  HPI/summary of hospital stay:  62 yo female with hx of cerebral aneurysms s/p intervention presented with persistent headache, and altered mental status.  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  Intubated for airway protection.  Hx of Stage 4 breast cancer with brain met tx with chemo and brain XRT.  Subjective  Denies headache, chest pain, abdominal pain.  Assessment & Plan:   Subarachnoid hemorrhage. - neurosurgery to determine whether she needs to have diagnostic angiogram - keppra for seizure prophylaxis - continue nimotop  Acute respiratory failure with hypoxia, hypercapnia. Tobacco abuse. 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 - followed by oncology in [place]  History of hypertension. Edema. - monitor hemodynamics - hold outpt coreg for now  Hyperglycemia. - SSI  Anemia, thrombocytopenia. - likely from chronic disease and chemotherapy - f/u CBC with differential - transfuse PLT for count < 10K or if she needs surgical intervention  Hypokalemia, hypomagnesemia. - replace as needed  Transient fever 9/09. - could be related to [facility] - f/u cultures - monitor off ABx   Best Practice / Goals of Care / Disposition.   DVT prophylaxis: SCDs GI prophylaxis: Protonix Diet: tube feeds Mobility: bed rest Code Status: full code Family Communication: updated on 03/23/18  Disposition / Summary of Today's Plan 03/23/18   Mental status improved, and CT head findings stable.  Still requires vent support.  Will start tube feeds.  Neurosurgery to determine next steps for intervention of aneurysms.  Consultants: date of consult/date signed off (if applicable)/final recs  Neurosurgery  9/09 >>  Procedures: Rt port >>  ETT 9/09 >>   Significant Diagnostic Tests: CT head 9/09 >> SAH, mild hydrocephalus EEG 9/09 >> slowing CT head 9/10 >> stable SAH and IVH Echo 9/10 >>   Micro Data: Urine 9/09 >> Blood 9/09 >> Sputum 9/09 >>  Antimicrobials:     Objective    Examination:  General - alert Eyes - pupils reactive ENT - ETT in place Cardiac - regular rate/rhythm, no murmur Chest - equal breath sounds b/l, no wheezing or rales Abdomen - soft, non tender, + bowel sounds, no hepatosplenomegaly GU - no lesions noted Extremities - mild oozing from IO site Rt tibia Skin - no rashes Neuro - follows commands, moves all extremities   Blood pressure (!) 98/49, pulse 77, temperature 98.8 F (37.1 C), resp. rate (!) 25, height '5\' 4"'  (1.626 m), weight 102 kg, last menstrual period 08/14/2010, SpO2 99 %.    Vent Mode: PRVC FiO2 (%):  [40 %-60 %] 50 % Set Rate:  [22 bmp-28 bmp] 28 bmp Vt Set:  [440 mL] 440 mL PEEP:  [5 cmH20] 5 cmH20 Plateau Pressure:  [20 cmH20-29 cmH20] 27 cmH20   Intake/Output Summary (Last 24 hours) at 03/23/2018 0820 Last data filed at 03/23/2018 0800 Gross per 24 hour  Intake 3369.84 ml  Output 1340 ml  Net 2029.84 ml   Filed Weights   03/22/18 0819 03/23/18 0404  Weight: 90.7 kg 102 kg     Labs    CBC: Recent Labs  Lab 03/22/18 0539 03/22/18 0546 03/23/18 0419  WBC 11.5*  --  8.7  NEUTROABS 9.7*  --   --   HGB 9.7* 9.5* 8.3*  HCT 31.8* 28.0* 26.7*  MCV 101.3*  --  98.9  PLT 73*  --  38*   Basic Metabolic Panel: Recent Labs  Lab 03/22/18 0539 03/22/18 0546 03/23/18 0419  NA 140 139 140  K 3.6 3.5 3.7  CL 105 103 109  CO2 25  --  21*  GLUCOSE 180* 183* 117*  BUN 6 5* 8  CREATININE 0.76 0.60 0.93  CALCIUM 8.5*  --  8.1*  MG  --   --  1.4*  PHOS  --   --  3.2   GFR: Estimated Creatinine Clearance: 74.7 mL/min (by C-G formula based on SCr of 0.93 mg/dL). Recent Labs  Lab 03/22/18 0539 03/23/18 0419  WBC  11.5* 8.7   Liver Function Tests: Recent Labs  Lab 03/22/18 0539  AST 33  ALT 35  ALKPHOS 76  BILITOT 0.5  PROT 5.8*  ALBUMIN 3.2*   No results for input(s): LIPASE, AMYLASE in the last 168 hours. No results for input(s): AMMONIA in the last 168 hours. ABG    Component Value Date/Time   PHART 7.368 03/23/2018 0330   PCO2ART 37.2 03/23/2018 0330   PO2ART 76.0 (L) 03/23/2018 0330   HCO3 21.3 03/23/2018 0330   TCO2 22 03/23/2018 0330   ACIDBASEDEF 3.0 (H) 03/23/2018 0330   O2SAT 94.0 03/23/2018 0330    Coagulation Profile: Recent Labs  Lab 03/22/18 0924  INR 1.04   Cardiac Enzymes: No results for input(s): CKTOTAL, CKMB, CKMBINDEX, TROPONINI in the last 168 hours. HbA1C: No results found for: HGBA1C CBG: Recent Labs  Lab 03/22/18 1156 03/22/18 1606 03/22/18 1935 03/22/18 2307 03/23/18 0312  GLUCAP 130* 124* 115* 136* 110*    CC time 32 minutes  Chesley Mires, MD [place] 03/23/2018, 8:30 AM

## 2018-03-23 NOTE — Progress Notes (Signed)
[facility] Progress Note Patient Name: Diane Short DOB: 10/08/1955 MRN: [id number]   Date of Service  03/23/2018  HPI/Events of Note  K+ = 3.7, Mg++ = 1.4 and Creatinine = 0.93.  eICU Interventions  Will replace Mg++ and K+.     Intervention Category Major Interventions: Electrolyte abnormality - evaluation and management  [name] 03/23/2018, 5:53 AM

## 2018-03-23 NOTE — Anesthesia Postprocedure Evaluation (Signed)
Anesthesia Post Note  Patient: Diane Short  Procedure(s) Performed: IR WITH ANESTHESIA (N/A )     Patient location during evaluation: ICU Anesthesia Type: General and Regional Level of consciousness: awake and patient remains intubated per anesthesia plan Pain management: pain level controlled Vital Signs Assessment: post-procedure vital signs reviewed and stable Respiratory status: patient remains intubated per anesthesia plan Cardiovascular status: stable Postop Assessment: no apparent nausea or vomiting Anesthetic complications: no    Last Vitals:  Vitals:   03/23/18 1450 03/23/18 1504  BP:  118/60  Pulse:  81  Resp:  (!) 28  Temp:    SpO2: 100% 100%    Last Pain:  Vitals:   03/22/18 2000  TempSrc: Bladder                 Diane Short

## 2018-03-23 NOTE — Transfer of Care (Signed)
Immediate Anesthesia Transfer of Care Note  Patient: Diane Short  Procedure(s) Performed: IR WITH ANESTHESIA (N/A )  Patient Location: NICU  Anesthesia Type:General  Level of Consciousness: sedated and Patient remains intubated per anesthesia plan  Airway & Oxygen Therapy: Patient remains intubated per anesthesia plan  Post-op Assessment: Report given to RN and Post -op Vital signs reviewed and stable  Post vital signs: Reviewed and stable  Last Vitals:  Vitals Value Taken Time  BP    Temp 36.8 C 03/23/2018  5:53 PM  Pulse 77 03/23/2018  5:53 PM  Resp 28 03/23/2018  5:53 PM  SpO2 100 % 03/23/2018  5:53 PM  Vitals shown include unvalidated device data.  Last Pain:  Vitals:   03/22/18 2000  TempSrc: Bladder         Complications: No apparent anesthesia complications

## 2018-03-23 NOTE — Progress Notes (Signed)
  Echocardiogram 2D Echocardiogram has been performed.  Johny Chess 03/23/2018, 10:28 AM

## 2018-03-23 NOTE — Progress Notes (Signed)
Patient transported from 4N18 to CT and back without any complications.

## 2018-03-23 NOTE — Plan of Care (Signed)
Pt currently intubated.  Started on Vital High Protein at 78ml/hour today. Will continue to monitor.  Katherine Mantle RN

## 2018-03-23 NOTE — Progress Notes (Signed)
Responded to spiritual care consult to support patient and family at bedside.  Patient husband, daughter and friend supporting patient.  Husband [name] said that patient was doing much better today.  Patient nurse affirmed that patient has made a lot of progress and she credits it to prayer, presence and support. Per husband request prayer was provided  And family asked that chaplain servides keep patient in prayer and welcomes a follow up visit at a later time. Provided empathetic listening, presence.emotional and spiritual support. Will follow as needed. Jaclynn Major, [place], [facility], Pager [phone]

## 2018-03-24 ENCOUNTER — Encounter (HOSPITAL_COMMUNITY): Payer: Self-pay | Admitting: Neurosurgery

## 2018-03-24 ENCOUNTER — Inpatient Hospital Stay (HOSPITAL_COMMUNITY): Payer: Medicaid Other

## 2018-03-24 DIAGNOSIS — Z978 Presence of other specified devices: Secondary | ICD-10-CM

## 2018-03-24 DIAGNOSIS — I609 Nontraumatic subarachnoid hemorrhage, unspecified: Secondary | ICD-10-CM

## 2018-03-24 DIAGNOSIS — J9589 Other postprocedural complications and disorders of respiratory system, not elsewhere classified: Secondary | ICD-10-CM

## 2018-03-24 LAB — PHOSPHORUS
PHOSPHORUS: 2.3 mg/dL — AB (ref 2.5–4.6)
Phosphorus: 2.6 mg/dL (ref 2.5–4.6)

## 2018-03-24 LAB — GLUCOSE, CAPILLARY
GLUCOSE-CAPILLARY: 139 mg/dL — AB (ref 70–99)
GLUCOSE-CAPILLARY: 144 mg/dL — AB (ref 70–99)
GLUCOSE-CAPILLARY: 177 mg/dL — AB (ref 70–99)
Glucose-Capillary: 114 mg/dL — ABNORMAL HIGH (ref 70–99)
Glucose-Capillary: 158 mg/dL — ABNORMAL HIGH (ref 70–99)
Glucose-Capillary: 207 mg/dL — ABNORMAL HIGH (ref 70–99)

## 2018-03-24 LAB — BASIC METABOLIC PANEL
Anion gap: 11 (ref 5–15)
BUN: 8 mg/dL (ref 6–20)
CO2: 19 mmol/L — ABNORMAL LOW (ref 22–32)
Calcium: 7.8 mg/dL — ABNORMAL LOW (ref 8.9–10.3)
Chloride: 111 mmol/L (ref 98–111)
Creatinine, Ser: 0.63 mg/dL (ref 0.44–1.00)
GFR calc non Af Amer: >60 mL/min (ref >60)
Glucose, Bld: 162 mg/dL — ABNORMAL HIGH (ref 70–99)
Potassium: 3.8 mmol/L (ref 3.5–5.1)
SODIUM: 141 mmol/L (ref 135–145)

## 2018-03-24 LAB — CBC WITH DIFFERENTIAL/PLATELET
ABS IMMATURE GRANULOCYTES: 0.2 10*3/uL — AB (ref 0.0–0.1)
BASOS ABS: 0 10*3/uL (ref 0.0–0.1)
BASOS PCT: 0 %
EOS ABS: 0 10*3/uL (ref 0.0–0.7)
Eosinophils Relative: 0 %
HCT: 21.8 % — ABNORMAL LOW (ref 36.0–46.0)
Hemoglobin: 6.8 g/dL — CL (ref 12.0–15.0)
IMMATURE GRANULOCYTES: 2 %
Lymphocytes Relative: 7 %
Lymphs Abs: 0.7 10*3/uL (ref 0.7–4.0)
MCH: 31.2 pg (ref 26.0–34.0)
MCHC: 31.2 g/dL (ref 30.0–36.0)
MCV: 100 fL (ref 78.0–100.0)
MONOS PCT: 8 %
Monocytes Absolute: 0.8 10*3/uL (ref 0.1–1.0)
NEUTROS ABS: 7.4 10*3/uL (ref 1.7–7.7)
NEUTROS PCT: 83 %
PLATELETS: 21 10*3/uL — AB (ref 150–400)
RBC: 2.18 MIL/uL — AB (ref 3.87–5.11)
RDW: 19 % — AB (ref 11.5–15.5)
WBC: 9.1 10*3/uL (ref 4.0–10.5)

## 2018-03-24 LAB — MAGNESIUM
Magnesium: 2 mg/dL (ref 1.7–2.4)
Magnesium: 1.9 mg/dL (ref 1.7–2.4)

## 2018-03-24 LAB — POCT I-STAT 3, ART BLOOD GAS (G3+)
Acid-base deficit: 5 mmol/L — ABNORMAL HIGH (ref 0.0–2.0)
Acid-base deficit: 2 mmol/L (ref 0.0–2.0)
BICARBONATE: 20.8 mmol/L (ref 20.0–28.0)
BICARBONATE: 24.7 mmol/L (ref 20.0–28.0)
O2 SAT: 97 %
O2 Saturation: 100 %
PCO2 ART: 41.4 mmHg (ref 32.0–48.0)
PO2 ART: 106 mmHg (ref 83.0–108.0)
Patient temperature: 99.9
TCO2: 22 mmol/L (ref 22–32)
TCO2: 26 mmol/L (ref 22–32)
pCO2 arterial: 57.4 mmHg — ABNORMAL HIGH (ref 32.0–48.0)
pH, Arterial: 7.313 — ABNORMAL LOW (ref 7.350–7.450)
pH, Arterial: 7.244 — ABNORMAL LOW (ref 7.350–7.450)
pO2, Arterial: 199 mmHg — ABNORMAL HIGH (ref 83.0–108.0)

## 2018-03-24 LAB — CULTURE, RESPIRATORY W GRAM STAIN

## 2018-03-24 LAB — PREPARE RBC (CROSSMATCH)

## 2018-03-24 LAB — URINE CULTURE

## 2018-03-24 LAB — HEMOGLOBIN AND HEMATOCRIT, BLOOD
HCT: 26.4 % — ABNORMAL LOW (ref 36.0–46.0)
Hemoglobin: 8.2 g/dL — ABNORMAL LOW (ref 12.0–15.0)

## 2018-03-24 LAB — CULTURE, RESPIRATORY: CULTURE: NORMAL

## 2018-03-24 LAB — TRIGLYCERIDES: TRIGLYCERIDES: 226 mg/dL — AB (ref <150)

## 2018-03-24 LAB — ABO/RH: ABO/RH(D): O POS

## 2018-03-24 MED ORDER — RACEPINEPHRINE HCL 2.25 % IN NEBU
0.5000 mL | INHALATION_SOLUTION | Freq: Once | RESPIRATORY_TRACT | Status: AC
  Administered 2018-03-24: 0.5 mL via RESPIRATORY_TRACT

## 2018-03-24 MED ORDER — SODIUM CHLORIDE 0.9% IV SOLUTION
Freq: Once | INTRAVENOUS | Status: AC
  Administered 2018-03-24: 13:00:00 via INTRAVENOUS

## 2018-03-24 MED ORDER — PRO-STAT SUGAR FREE PO LIQD
60.0000 mL | Freq: Two times a day (BID) | ORAL | Status: DC
  Administered 2018-03-24 – 2018-03-29 (×10): 60 mL
  Filled 2018-03-24 (×10): qty 60

## 2018-03-24 MED ORDER — PROPOFOL 1000 MG/100ML IV EMUL
0.0000 ug/kg/min | INTRAVENOUS | Status: DC
  Administered 2018-03-24: 25 ug/kg/min via INTRAVENOUS
  Administered 2018-03-24: 15 ug/kg/min via INTRAVENOUS
  Administered 2018-03-24: 35 ug/kg/min via INTRAVENOUS
  Administered 2018-03-25: 40 ug/kg/min via INTRAVENOUS
  Administered 2018-03-25: 30 ug/kg/min via INTRAVENOUS
  Administered 2018-03-25: 50 ug/kg/min via INTRAVENOUS
  Administered 2018-03-25 (×2): 40 ug/kg/min via INTRAVENOUS
  Administered 2018-03-26 (×4): 50 ug/kg/min via INTRAVENOUS
  Filled 2018-03-24 (×11): qty 100

## 2018-03-24 MED ORDER — FENTANYL CITRATE (PF) 100 MCG/2ML IJ SOLN
100.0000 ug | INTRAMUSCULAR | Status: DC | PRN
  Administered 2018-03-24 – 2018-03-25 (×11): 100 ug via INTRAVENOUS
  Filled 2018-03-24 (×11): qty 2

## 2018-03-24 MED ORDER — PIPERACILLIN-TAZOBACTAM 3.375 G IVPB
3.3750 g | Freq: Three times a day (TID) | INTRAVENOUS | Status: DC
  Administered 2018-03-24 – 2018-03-26 (×6): 3.375 g via INTRAVENOUS
  Filled 2018-03-24 (×8): qty 50

## 2018-03-24 MED ORDER — RACEPINEPHRINE HCL 2.25 % IN NEBU
INHALATION_SOLUTION | RESPIRATORY_TRACT | Status: AC
  Administered 2018-03-24: 0.5 mL via RESPIRATORY_TRACT
  Filled 2018-03-24: qty 0.5

## 2018-03-24 MED ORDER — SODIUM PHOSPHATES 45 MMOLE/15ML IV SOLN
20.0000 mmol | Freq: Once | INTRAVENOUS | Status: AC
  Administered 2018-03-24: 20 mmol via INTRAVENOUS
  Filled 2018-03-24 (×2): qty 6.67

## 2018-03-24 MED ORDER — PROPOFOL 1000 MG/100ML IV EMUL
INTRAVENOUS | Status: AC
  Administered 2018-03-24: 15 ug/kg/min via INTRAVENOUS
  Filled 2018-03-24: qty 100

## 2018-03-24 MED ORDER — FENTANYL CITRATE (PF) 100 MCG/2ML IJ SOLN
100.0000 ug | INTRAMUSCULAR | Status: AC | PRN
  Administered 2018-03-24 – 2018-03-25 (×3): 100 ug via INTRAVENOUS
  Filled 2018-03-24 (×3): qty 2

## 2018-03-24 MED ORDER — FENTANYL CITRATE (PF) 100 MCG/2ML IJ SOLN
100.0000 ug | Freq: Once | INTRAMUSCULAR | Status: AC
  Administered 2018-03-24: 100 ug via INTRAVENOUS

## 2018-03-24 MED ORDER — SODIUM CHLORIDE 0.9 % IV SOLN
1.0000 g | INTRAVENOUS | Status: DC
  Filled 2018-03-24: qty 10

## 2018-03-24 MED ORDER — ETOMIDATE 2 MG/ML IV SOLN
20.0000 mg | Freq: Once | INTRAVENOUS | Status: AC
  Administered 2018-03-24: 20 mg via INTRAVENOUS

## 2018-03-24 MED ORDER — FENTANYL CITRATE (PF) 100 MCG/2ML IJ SOLN
INTRAMUSCULAR | Status: AC
  Administered 2018-03-24: 100 ug via INTRAVENOUS
  Filled 2018-03-24: qty 4

## 2018-03-24 MED ORDER — MIDAZOLAM HCL 2 MG/2ML IJ SOLN
INTRAMUSCULAR | Status: AC
  Administered 2018-03-24: 12:00:00
  Filled 2018-03-24: qty 2

## 2018-03-24 MED ORDER — DEXAMETHASONE SODIUM PHOSPHATE 4 MG/ML IJ SOLN
4.0000 mg | Freq: Four times a day (QID) | INTRAMUSCULAR | Status: AC
  Administered 2018-03-24 – 2018-03-25 (×4): 4 mg via INTRAVENOUS
  Filled 2018-03-24 (×4): qty 1

## 2018-03-24 MED ORDER — VITAL HIGH PROTEIN PO LIQD
1000.0000 mL | ORAL | Status: DC
  Administered 2018-03-24 – 2018-03-28 (×4): 1000 mL
  Filled 2018-03-24 (×3): qty 1000

## 2018-03-24 NOTE — Procedures (Signed)
Extubation Procedure Note  Patient Details:   Name: Diane Short DOB: [birth year] MRN: [id number]   Airway Documentation:    Vent end date: 03/24/18 Vent end time: 1026   Evaluation  O2 sats: transiently fell during during procedure Complications: Complications of stridor Patient did not tolerate procedure well. Bilateral Breath Sounds: Stridor   Yes   Positive cuff leak noted prior to extubation.  Within a few minutes of extubation, pt developed stridor.  NP notified and racemic epi ordered.  Racemic started at 10:33, pt's WOB and stridor increased. NP and MD with family at bedside decided to re-intubate.  Diane Short 03/24/2018, 11:10 AM

## 2018-03-24 NOTE — Progress Notes (Addendum)
Diane Short  GLO:[id number] DOB: [birth year] DOA: 03/22/2018 PCP: Burnard Bunting, MD    LOS: 2 days   Reason for Consult / Chief Complaint:  Headache  Consulting MD and date of consult:  Dr. Leonette Monarch, ER 03/22/2018  HPI/summary of hospital stay:  62 yo female with hx of cerebral aneurysms s/p intervention presented with persistent headache, and altered mental status.  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  Intubated for airway protection.  Nimodipine started.  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  9/11: Awake, following commands, passed SBT but developed stridor and desaturated. Vomited during intubation. ABX started empirically for aspiration and identified UTI.  On vent. Decadron started.   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 Stridor, failed extubation 9/11 c/b Possible aspiration PNA Tobacco abuse. Extubated-->developed inspiratory stridor. Re-intubated.  pcxr ett good position. RLL atx vs infiltrate. ? 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 [place] History of hypertension. Edema. Plan Holding outpatient Coreg for now Continue telemetry monitoring  Hyperglycemia. Plan Sliding scale insulin  Anemia, thrombocytopenia. - likely from chronic disease and chemotherapy -No evidence of acute bleeding -Hemoglobin is down to 6.8 Plan Transfuse 1 unit PRBCs Transfuse for platelet count less  than 10 K A.m. Chemistry  Intermittent fluid and electrolyte imbalance: Mild normal anion gap metabolic acidosis, hypophosphatemia Plan Watching chloride levels Trend chemistries Replace electrolytes as needed  Transient fever 9/09.  She does have a E. coli urinary tract infection Plan Ceftriaxone day 1 of 5 DC Foley if able  Best Practice / Goals of Care / Disposition.   DVT prophylaxis: SCDs GI prophylaxis: Protonix Diet: tube feeds Mobility: bed rest Code Status: full code Family Communication: updated on 03/23/18  Disposition / Summary of Today's Plan 03/24/18   Mental status improved, and CT head findings stable.  Still requires vent support.  Will start tube feeds.  Neurosurgery to determine next steps for intervention of aneurysms.  Consultants: date of consult/date signed off (if applicable)/final recs  Neurosurgery 9/09 >>  Procedures: Rt port >>  ETT 9/09 >> 9/11 9/10: Angiogram and successful coiling of PICA aneurysm by neurosurgery  Significant Diagnostic Tests: CT head 9/09 >> SAH, mild hydrocephalus EEG 9/09 >> slowing CT head 9/10 >> stable SAH and IVH Echo 9/10 >> left ventricular EF 55 to 60%.  Wall motion was normal.  Grade 1 diastolic dysfunction  Micro Data: Urine 9/09 : >100K E coli. Pan sens Blood 9/09 >> Sputum 9/09 >>  Antimicrobials:   Zosyn 9/11>>>  Objective   Examination: General: 62year old white female currently resting in bed she looks excellent on spontaneous breathing trial HEENT normocephalic atraumatic no jugular venous distention mucous membranes moist orally intubated Pulmonary: Clear to auscultation excellent tidal volume on pressure support ventilation of 5 Cardiac: Regular rate and rhythm without murmur rub or gallop Abdomen: Soft nontender no organomegaly GU: Clear yellow Neuro: Awake, moves all extremities, appropriate. Extremities: Warm  and dry   Blood pressure 116/88, pulse 72, temperature 99.7 F (37.6 C),  temperature source Bladder, resp. rate (Abnormal) 28, height '5\' 4"'  (1.626 m), weight 102 kg, last menstrual period 08/14/2010, SpO2 100 %.    Vent Mode: PRVC FiO2 (%):  [50 %] 50 % Set Rate:  [28 bmp] 28 bmp Vt Set:  [440 mL] 440 mL PEEP:  [5 cmH20] 5 cmH20 Plateau Pressure:  [19 cmH20-27 cmH20] 23 cmH20   Intake/Output Summary (Last 24 hours) at 03/24/2018 0858 Last data filed at 03/24/2018 0800 Gross per 24 hour  Intake 3143.45 ml  Output 1980 ml  Net 1163.45 ml   Filed Weights   03/22/18 0819 03/23/18 0404  Weight: 90.7 kg 102 kg     Labs    CBC: Recent Labs  Lab 03/22/18 0539 03/22/18 0546 03/23/18 0419 03/24/18 0542  WBC 11.5*  --  8.7 9.1  NEUTROABS 9.7*  --   --  7.4  HGB 9.7* 9.5* 8.3* 6.8*  HCT 31.8* 28.0* 26.7* 21.8*  MCV 101.3*  --  98.9 100.0  PLT 73*  --  38* 21*   Basic Metabolic Panel: Recent Labs  Lab 03/22/18 0539 03/22/18 0546 03/23/18 0419 03/23/18 0924 03/23/18 1810 03/24/18 0542  NA 140 139 140  --   --  141  K 3.6 3.5 3.7  --   --  3.8  CL 105 103 109  --   --  111  CO2 25  --  21*  --   --  19*  GLUCOSE 180* 183* 117*  --   --  162*  BUN 6 5* 8  --   --  8  CREATININE 0.76 0.60 0.93  --   --  0.63  CALCIUM 8.5*  --  8.1*  --   --  7.8*  MG  --   --  1.4* 2.2 1.9 2.0  PHOS  --   --  3.2 2.8 2.8 2.3*   GFR: Estimated Creatinine Clearance: 86.9 mL/min (by C-G formula based on SCr of 0.63 mg/dL). Recent Labs  Lab 03/22/18 0539 03/23/18 0419 03/24/18 0542  WBC 11.5* 8.7 9.1   Liver Function Tests: Recent Labs  Lab 03/22/18 0539  AST 33  ALT 35  ALKPHOS 76  BILITOT 0.5  PROT 5.8*  ALBUMIN 3.2*   No results for input(s): LIPASE, AMYLASE in the last 168 hours. No results for input(s): AMMONIA in the last 168 hours. ABG    Component Value Date/Time   PHART 7.313 (L) 03/24/2018 0332   PCO2ART 41.4 03/24/2018 0332   PO2ART 106.0 03/24/2018 0332   HCO3 20.8 03/24/2018 0332   TCO2 22 03/24/2018 0332   ACIDBASEDEF 5.0 (H)  03/24/2018 0332   O2SAT 97.0 03/24/2018 0332    Coagulation Profile: Recent Labs  Lab 03/22/18 0924  INR 1.04   Cardiac Enzymes: No results for input(s): CKTOTAL, CKMB, CKMBINDEX, TROPONINI in the last 168 hours. HbA1C: No results found for: HGBA1C CBG: Recent Labs  Lab 03/23/18 1125 03/23/18 1930 03/23/18 2326 03/24/18 0320 03/24/18 0837  GLUCAP 123* 118* 116* 139* Novice ACNP-BC [place] Pager # [phone] OR # [phone] if no answer

## 2018-03-24 NOTE — Progress Notes (Signed)
Nutrition Follow-up  DOCUMENTATION CODES:   Obesity unspecified  INTERVENTION:   Vital High Protein @ 25 ml/hr (600 ml/day) 60 ml Prostat BID  Provides: 1000 kcal, 112 grams protein, and 501 ml free water.  TF regimen and propofol at current rate providing 1359 total kcal/day    NUTRITION DIAGNOSIS:   Inadequate oral intake related to inability to eat as evidenced by NPO status. Ongoing.   GOAL:   Provide needs based on ASPEN/SCCM guidelines Progressing.   MONITOR:   Vent status, TF tolerance, Labs, I & O's  ASSESSMENT:   62 yo female with PMH of HTN, GERD, hiatal hernia, breast cancer with brain mets (receiving chemo & XRT), and osteonecrosis of R jaw bone who was admitted on 9/9 with SAH.  Pt discussed during ICU rounds and with RN.  Pt extubated this am but failed and was re-intubated  Patient is currently intubated on ventilator support Temp (24hrs), Avg:99.5 F (37.5 C), Min:98.1 F (36.7 C), Max:101.5 F (38.6 C)  Propofol: 13.6 ml/hr provides 359 kcal per day Medications reviewed and include: decadron, colace, SSI Labs reviewed: PO4: 2.3 (L), hemoglobin: 6.8 (L) MAP: 93   I/O: +3488 ml since admit UOP: 1570 ml x 24 hrs   OG tube in place, TF off   Diet Order:   Diet Order            Diet NPO time specified  Diet effective now              EDUCATION NEEDS:   No education needs have been identified at this time  Skin:  Skin Assessment: Reviewed RN Assessment  Last BM:  9/11 type 7 x 4  Height:   Ht Readings from Last 1 Encounters:  03/24/18 5\' 4"  (1.626 m)    Weight:   Wt Readings from Last 1 Encounters:  03/23/18 102 kg    Ideal Body Weight:  54.5 kg  BMI:  Body mass index is 38.6 kg/m.  Estimated Nutritional Needs:   Kcal:  1000-1270  Protein:  109 gm  Fluid:  >/= 1.5 L  Maylon Peppers RD, LDN, CNSC [phone] Pager [phone] After Hours Pager

## 2018-03-24 NOTE — Procedures (Addendum)
Intubation Procedure Note Kinsley Holderman [id number] 05-03-1956  Procedure: Intubation Indications: Respiratory insufficiency Developed inspiratory stridor and desaturation after extubation Procedure Details Consent: Risks of procedure as well as the alternatives and risks of each were explained to the (patient/caregiver).  Consent for procedure obtained. Time Out: Verified patient identification, verified procedure, site/side was marked, verified correct patient position, special equipment/implants available, medications/allergies/relevent history reviewed, required imaging and test results available.  Performed Cords were edematous and swollen Maximum sterile technique was used including antiseptics, cap, gloves, hand hygiene and mask.  MAC and 4    Evaluation Hemodynamic Status: BP stable throughout; O2 sats: stable throughout Patient's Current Condition: stable Complications: No apparent complications Patient did tolerate procedure well. Chest X-ray ordered to verify placement.  CXR: pending.   Clementeen Graham 03/24/2018  Erick Colace ACNP-BC [place] Pager # [phone] OR # [phone] if no answer

## 2018-03-24 NOTE — Progress Notes (Signed)
PCCM INTERVAL PROGRESS NOTE  Called to bedside to evaluate patient for oral bleeding. Notably she was extubated and unfortunately re-intubated earlier today. She has had some bleeding from her mouth since that time. Upon my assessment there is minimal blood in the OG tubing, subglottic tubing, and about 69mL blood in the suction canister attached to the oral suction device. Appears as though whatever injury there is is above the ETT cuff. Cannot visually identify any injury to the oral mucosa.   Plan - Will monitor this overnight.  - If this persists or worsens, will give platelets. Hers were 20K - Recent Hgb 8.2 which is not out of the ordinary for her, and will be repeating in the morning.    Georgann Housekeeper, AGACNP-BC [facility] Pulmonology/Critical Care Pager [phone] or [phone]  03/24/2018 10:12 PM

## 2018-03-24 NOTE — Progress Notes (Signed)
Transcranial Doppler  Date POD PCO2 HCT BP  MCA ACA PCA OPHT SIPH VERT Basilar  03/24/18 vs     Right  Left   92  13   -58  -52   19  *   28  *   137  *   93  *   90           Right  Left                                            Right  Left                                             Right  Left                                             Right  Left                                            Right  Left                                            Right  Left                                        MCA = Middle Cerebral Artery      OPHT = Opthalmic Artery     BASILAR = Basilar Artery   ACA = Anterior Cerebral Artery     SIPH = Carotid Siphon PCA = Posterior Cerebral Artery   VERT = Verterbral Artery                   Normal MCA = 62+\-12 ACA = 50+\-12 PCA = 42+\-23   03/24/2018 * Not found Lindegaard ratio - Rt 0.99 Lt 0.12

## 2018-03-24 NOTE — Progress Notes (Signed)
CRITICAL VALUE ALERT  Critical Value:  Hemoglobin   Date & Time Notied:  03/24/18 @ 0730  Provider Notified: Marni Griffon NP  Orders Received/Actions taken: see MAR for orders

## 2018-03-24 NOTE — Progress Notes (Signed)
  NEUROSURGERY PROGRESS NOTE   Pt seen and examined. No issues overnight.   EXAM: Temp:  [98.1 F (36.7 C)-101.5 F (38.6 C)] 99.5 F (37.5 C) (09/11 0900) Pulse Rate:  [62-83] 66 (09/11 0926) Resp:  [12-29] 12 (09/11 0926) BP: (108-150)/(53-117) 116/88 (09/11 0926) SpO2:  [99 %-100 %] 100 % (09/11 0926) Arterial Line BP: (102-150)/(50-73) 121/62 (09/11 0900) FiO2 (%):  [40 %-50 %] 40 % (09/11 0926) Intake/Output      09/10 0701 - 09/11 0700 09/11 0701 - 09/12 0700   I.V. (mL/kg) 2239.1 (22) 76.2 (0.7)   NG/GT 685.2    IV Piggyback 277    Total Intake(mL/kg) 3201.3 (31.4) 76.2 (0.7)   Urine (mL/kg/hr) 1570 (0.6)    Emesis/NG output 400    Blood 10    Total Output 1980    Net +1221.3 +76.2         Awake, alert Nods appropriately to questions. Follows commands briskly x4 ext  LABS: Lab Results  Component Value Date   CREATININE 0.63 03/24/2018   BUN 8 03/24/2018   NA 141 03/24/2018   K 3.8 03/24/2018   CL 111 03/24/2018   CO2 19 (L) 03/24/2018   Lab Results  Component Value Date   WBC 9.1 03/24/2018   HGB 6.8 (LL) 03/24/2018   HCT 21.8 (L) 03/24/2018   MCV 100.0 03/24/2018   PLT 21 (LL) 03/24/2018    IMPRESSION: - 62 y.o. female SAH d#2 s/p coiling recanalized left PICA aneurysm. Doing well neurologically  PLAN: - Likely extubate this am - Cont supportive care, SBP up to 13mmHg today - Nimotop - TCD today

## 2018-03-24 NOTE — Progress Notes (Signed)
175 ml wasted from Fentanyl gtt bag with Arlee Muslim, RN

## 2018-03-24 NOTE — Addendum Note (Signed)
Addendum  created 03/24/18 2309 by Belinda Block, MD   Attestation recorded in [place], [place] filed, Dance movement psychotherapist edited

## 2018-03-24 NOTE — Progress Notes (Signed)
Pharmacy Antibiotic Note  Diane Short is a 62 y.o. female admitted on 03/22/2018 with UTI.  Pharmacy has been consulted for Ceftriaxone dosing.  Plan: Ceftriaxone 1 gram Iv q24hrs x 5 days  Height: 5\' 4"  (162.6 cm) Weight: 224 lb 13.9 oz (102 kg) IBW/kg (Calculated) : 54.7  Temp (24hrs), Avg:99.4 F (37.4 C), Min:98.1 F (36.7 C), Max:101.5 F (38.6 C)  Recent Labs  Lab 03/22/18 0539 03/22/18 0546 03/23/18 0419 03/24/18 0542  WBC 11.5*  --  8.7 9.1  CREATININE 0.76 0.60 0.93 0.63    Estimated Creatinine Clearance: 86.9 mL/min (by C-G formula based on SCr of 0.63 mg/dL).    Allergies  Allergen Reactions  . Nsaids Other (See Comments)    GI upset   . Ace Inhibitors Other (See Comments)    cough    Antimicrobials this admission: Ceftriaxone 9/11 >> 9/15  Microbiology results: 9/9 BCx: ngtd 9/9 UCx: E. Coli - pan sensitive 9/9 Sputum: ngtd  9/9 MRSA PCR: negative  Thank you for allowing pharmacy to be a part of this patient's care.  Alanda Slim, PharmD, [facility] Clinical Pharmacist Please see AMION for all Pharmacists' Contact Phone Numbers 03/24/2018, 10:22 AM

## 2018-03-25 DIAGNOSIS — J9601 Acute respiratory failure with hypoxia: Secondary | ICD-10-CM

## 2018-03-25 LAB — BPAM RBC
Blood Product Expiration Date: 201910082359
ISSUE DATE / TIME: 201909111242
Unit Type and Rh: 5100

## 2018-03-25 LAB — BASIC METABOLIC PANEL
ANION GAP: 10 (ref 5–15)
BUN: 10 mg/dL (ref 6–20)
CHLORIDE: 114 mmol/L — AB (ref 98–111)
CO2: 19 mmol/L — ABNORMAL LOW (ref 22–32)
Calcium: 7.9 mg/dL — ABNORMAL LOW (ref 8.9–10.3)
Creatinine, Ser: 0.81 mg/dL (ref 0.44–1.00)
GFR calc Af Amer: >60 mL/min (ref >60)
GFR calc non Af Amer: >60 mL/min (ref >60)
Glucose, Bld: 210 mg/dL — ABNORMAL HIGH (ref 70–99)
POTASSIUM: 3.4 mmol/L — AB (ref 3.5–5.1)
SODIUM: 143 mmol/L (ref 135–145)

## 2018-03-25 LAB — GLUCOSE, CAPILLARY
GLUCOSE-CAPILLARY: 195 mg/dL — AB (ref 70–99)
GLUCOSE-CAPILLARY: 193 mg/dL — AB (ref 70–99)
GLUCOSE-CAPILLARY: 164 mg/dL — AB (ref 70–99)
GLUCOSE-CAPILLARY: 159 mg/dL — AB (ref 70–99)
Glucose-Capillary: 155 mg/dL — ABNORMAL HIGH (ref 70–99)
Glucose-Capillary: 168 mg/dL — ABNORMAL HIGH (ref 70–99)

## 2018-03-25 LAB — TYPE AND SCREEN
ABO/RH(D): O POS
ANTIBODY SCREEN: NEGATIVE
UNIT DIVISION: 0

## 2018-03-25 LAB — CBC
HEMATOCRIT: 26.1 % — AB (ref 36.0–46.0)
HEMOGLOBIN: 8.1 g/dL — AB (ref 12.0–15.0)
MCH: 30.3 pg (ref 26.0–34.0)
MCHC: 31 g/dL (ref 30.0–36.0)
MCV: 97.8 fL (ref 78.0–100.0)
Platelets: 23 10*3/uL — CL (ref 150–400)
RBC: 2.67 MIL/uL — ABNORMAL LOW (ref 3.87–5.11)
RDW: 18.9 % — ABNORMAL HIGH (ref 11.5–15.5)
WBC: 12 10*3/uL — AB (ref 4.0–10.5)

## 2018-03-25 MED ORDER — DEXAMETHASONE SODIUM PHOSPHATE 4 MG/ML IJ SOLN
4.0000 mg | Freq: Four times a day (QID) | INTRAMUSCULAR | Status: AC
  Administered 2018-03-25 (×2): 4 mg via INTRAVENOUS
  Filled 2018-03-25 (×2): qty 1

## 2018-03-25 MED ORDER — LABETALOL HCL 5 MG/ML IV SOLN
10.0000 mg | INTRAVENOUS | Status: AC | PRN
  Administered 2018-03-25: 10 mg via INTRAVENOUS
  Administered 2018-03-25 (×2): 20 mg via INTRAVENOUS
  Administered 2018-03-25: 10 mg via INTRAVENOUS
  Administered 2018-03-25 – 2018-03-26 (×4): 20 mg via INTRAVENOUS
  Administered 2018-03-26 (×2): 10 mg via INTRAVENOUS
  Filled 2018-03-25 (×10): qty 4

## 2018-03-25 MED ORDER — FENTANYL 2500MCG IN NS 250ML (10MCG/ML) PREMIX INFUSION
0.0000 ug/h | INTRAVENOUS | Status: DC
  Administered 2018-03-25: 25 ug/h via INTRAVENOUS
  Administered 2018-03-26: 40 ug/h via INTRAVENOUS
  Filled 2018-03-25 (×2): qty 250

## 2018-03-25 MED ORDER — HYDRALAZINE HCL 20 MG/ML IJ SOLN
10.0000 mg | INTRAMUSCULAR | Status: DC | PRN
  Administered 2018-03-25 – 2018-03-26 (×3): 20 mg via INTRAVENOUS
  Filled 2018-03-25: qty 1
  Filled 2018-03-25: qty 2
  Filled 2018-03-25: qty 1

## 2018-03-25 MED ORDER — FAMOTIDINE IN NACL 20-0.9 MG/50ML-% IV SOLN
20.0000 mg | INTRAVENOUS | Status: DC
  Administered 2018-03-25: 20 mg via INTRAVENOUS
  Filled 2018-03-25 (×2): qty 50

## 2018-03-25 MED ORDER — POTASSIUM CHLORIDE 20 MEQ/15ML (10%) PO SOLN
40.0000 meq | Freq: Once | ORAL | Status: AC
  Administered 2018-03-25: 40 meq
  Filled 2018-03-25: qty 30

## 2018-03-25 NOTE — Progress Notes (Signed)
[facility] Progress Note Patient Name: Diane Short DOB: [birth year] MRN: [id number]   Date of Service  03/25/2018  HPI/Events of Note  Patient on mechanical ventilation whom remains restless and uncomfortable on propofol and intermittent fentanyl  eICU Interventions  Start fentanyl drip.     Intervention Category Minor Interventions: Agitation / anxiety - evaluation and management  Mauri [place], P 03/25/2018, 10:18 PM

## 2018-03-25 NOTE — Progress Notes (Signed)
Diane Short  TML:[id number] DOB: 1956-03-09 DOA: 03/22/2018 PCP: Burnard Bunting, MD    LOS: 3 days   Reason for Consult / Chief Complaint:  Headache  Consulting MD and date of consult:  Dr. Leonette Monarch, ER 03/22/2018  HPI/summary of hospital stay:  62 yo female with hx of cerebral aneurysms s/p intervention presented with persistent headache, and altered mental status.  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  Intubated for airway protection.  Nimodipine started.  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  9/11: Awake, following commands, passed SBT but developed stridor and desaturated. Vomited during intubation. ABX started empirically for aspiration and identified UTI.  On vent. Decadron started.   9/11 extubated but failed due to stridor; therefore, reintubated  Consultants: date of consult/date signed off (if applicable)/final recs  Neurosurgery 9/09 >>  Procedures: Rt port >>  ETT 9/09 >> 9/11.  9/11 >  9/10: Angiogram and successful coiling of PICA aneurysm by neurosurgery  Significant Diagnostic Tests: CT head 9/09 >> SAH, mild hydrocephalus EEG 9/09 >> slowing CT head 9/10 >> stable SAH and IVH Echo 9/10 >> left ventricular EF 55 to 60%.  Grade 1 diastolic dysfunction.  Micro Data: Urine 9/09 : >100K E coli. Pan sens Blood 9/09 >> Sputum 9/09 >>  Antimicrobials:   Zosyn 9/11>>>   Subjective  Comfortable on vent now, was agitated earlier during WUA.  Wanting tube out. Had some minimal oral bleeding overnight but no source identified.   Assessment & Plan:   Subarachnoid hemorrhage status post coiling and embolization of PICA aneurysm 9/10. Plan Serial neuro checks Keppra for seizure prophylaxis Continue Nimodipine  Defer serial transcutaneous Dopplers timing to neurosurgeon BP goal < 160  Acute respiratory failure with hypoxia, hypercapnia - extubated  9/11 but failed due to stridor and required reintubation. Possible aspiration PNA. Tobacco abuse. Plan Full vent support Hold SBT this AM given airway edema / stridor Continue decadron - 2 more doses today Continue zosyn PAD protocol VAP bundle  Follow CXR  Stage 4 Breast cancer with mets to brain - followed by oncology in [place]. Plan Follow-up in [place]  History of hypertension. Edema. Plan Holding outpatient Coreg for now Labetalol and Hydralazine PRN, goal SBP < 160 Continue telemetry monitoring  Hyperglycemia. Plan Sliding scale insulin  Anemia. Thrombocytopenia. - likely from chronic disease and chemotherapy -No evidence of acute bleeding Plan Transfuse for Hgb < 7 Transfuse for platelet count less than 10 K Follow CBC  Hypokalemia. Mild normal anion gap metabolic acidosis. Hypophosphatemia - resolved. Plan Trend chemistries 40 mEq per tube Replace electrolytes as needed  E. coli urinary tract infection (pan sens) - was on ceftriaxone; however, this was changed to zosyn to also cover for possible aspiration. Plan Continue zosyn  Best Practice / Goals of Care / Disposition.   DVT prophylaxis: SCDs GI prophylaxis: Famotidine Diet: tube feeds Mobility: bed rest Code Status: full code Family Communication: updated on 03/23/18  Disposition / Summary of Today's Plan 03/25/18   Continuing full vent support, not weaning this AM due to airway edema noted during re-intubation. Neurosurgery to determine next steps for intervention of aneurysms.  Objective   Examination:  General: Adult female, no distress. HEENT: [place]/AT, MMM.  ETT in place. Pulmonary: CTAB. Cardiac: RRR, no M/R/G. Abdomen: BS x 4, S/NT/ND. Neuro: Sedated but opens eyes to voice and follows  commands. Extremities: Warm and dry.   Blood pressure (!) 150/67, pulse (!) 104, temperature (!) 100.4 F (38 C), resp. rate 20, height '5\' 4"'  (1.626 m), weight 102 kg, last menstrual period  08/14/2010, SpO2 100 %.    Vent Mode: PRVC FiO2 (%):  [40 %-100 %] 40 % Set Rate:  [16 bmp-20 bmp] 20 bmp Vt Set:  [440 mL] 440 mL PEEP:  [5 cmH20] 5 cmH20 Pressure Support:  [5 cmH20] 5 cmH20 Plateau Pressure:  [12 cmH20-20 cmH20] 12 cmH20   Intake/Output Summary (Last 24 hours) at 03/25/2018 0824 Last data filed at 03/25/2018 0700 Gross per 24 hour  Intake 3358.55 ml  Output 1525 ml  Net 1833.55 ml   Filed Weights   03/22/18 0819 03/23/18 0404  Weight: 90.7 kg 102 kg     Labs    CBC: Recent Labs  Lab 03/22/18 0539 03/22/18 0546 03/23/18 0419 03/24/18 0542 03/24/18 1717 03/25/18 0555  WBC 11.5*  --  8.7 9.1  --  12.0*  NEUTROABS 9.7*  --   --  7.4  --   --   HGB 9.7* 9.5* 8.3* 6.8* 8.2* 8.1*  HCT 31.8* 28.0* 26.7* 21.8* 26.4* 26.1*  MCV 101.3*  --  98.9 100.0  --  97.8  PLT 73*  --  38* 21*  --  23*   Basic Metabolic Panel: Recent Labs  Lab 03/22/18 0539 03/22/18 0546 03/23/18 0419 03/23/18 0924 03/23/18 1810 03/24/18 0542 03/24/18 1714 03/25/18 0555  NA 140 139 140  --   --  141  --  143  K 3.6 3.5 3.7  --   --  3.8  --  3.4*  CL 105 103 109  --   --  111  --  114*  CO2 25  --  21*  --   --  19*  --  19*  GLUCOSE 180* 183* 117*  --   --  162*  --  210*  BUN 6 5* 8  --   --  8  --  10  CREATININE 0.76 0.60 0.93  --   --  0.63  --  0.81  CALCIUM 8.5*  --  8.1*  --   --  7.8*  --  7.9*  MG  --   --  1.4* 2.2 1.9 2.0 1.9  --   PHOS  --   --  3.2 2.8 2.8 2.3* 2.6  --    GFR: Estimated Creatinine Clearance: 85.8 mL/min (by C-G formula based on SCr of 0.81 mg/dL). Recent Labs  Lab 03/22/18 0539 03/23/18 0419 03/24/18 0542 03/25/18 0555  WBC 11.5* 8.7 9.1 12.0*   Liver Function Tests: Recent Labs  Lab 03/22/18 0539  AST 33  ALT 35  ALKPHOS 76  BILITOT 0.5  PROT 5.8*  ALBUMIN 3.2*   No results for input(s): LIPASE, AMYLASE in the last 168 hours. No results for input(s): AMMONIA in the last 168 hours. ABG    Component Value Date/Time    PHART 7.244 (L) 03/24/2018 1212   PCO2ART 57.4 (H) 03/24/2018 1212   PO2ART 199.0 (H) 03/24/2018 1212   HCO3 24.7 03/24/2018 1212   TCO2 26 03/24/2018 1212   ACIDBASEDEF 2.0 03/24/2018 1212   O2SAT 100.0 03/24/2018 1212    Coagulation Profile: Recent Labs  Lab 03/22/18 0924  INR 1.04   Cardiac Enzymes: No results for input(s): CKTOTAL, CKMB, CKMBINDEX, TROPONINI in the last 168 hours. HbA1C: No results found for: HGBA1C CBG: Recent Labs  Lab 03/24/18 1529 03/24/18 1940 03/24/18 2341 03/25/18 0323 03/25/18 0752  GLUCAP 114* 177* 207* 195* 193*    CC time: 30 min.   Montey Hora, [facility] Pager: [phone]  or [phone] 03/25/2018, 8:43 AM

## 2018-03-25 NOTE — Progress Notes (Addendum)
E-Link MD made aware of patient's restlessness even while on propofol. New orders received for fentanyl drip, will continue to monitor.

## 2018-03-25 NOTE — Progress Notes (Signed)
  NEUROSURGERY PROGRESS NOTE   Pt seen and examined. Pt extubated yesterday but required immediate reintubation for stridor/upper airway edema. Started on dex.  EXAM: Temp:  [98.8 F (37.1 C)-100.9 F (38.3 C)] 99.3 F (37.4 C) (09/12 1148) Pulse Rate:  [73-136] 97 (09/12 1148) Resp:  [10-27] 27 (09/12 1148) BP: (101-172)/(47-107) 133/53 (09/12 1148) SpO2:  [93 %-100 %] 100 % (09/12 1148) Arterial Line BP: (110-248)/(49-97) 150/56 (09/12 1100) FiO2 (%):  [40 %-60 %] 40 % (09/12 1148) Intake/Output      09/11 0701 - 09/12 0700 09/12 0701 - 09/13 0700   I.V. (mL/kg) 2188.5 (21.5)    Blood 315    NG/GT 460    IV Piggyback 471.3    Total Intake(mL/kg) 3434.8 (33.7)    Urine (mL/kg/hr) 1235 (0.5)    Emesis/NG output 90    Stool 200    Blood     Total Output 1525    Net +1909.8         Stool Occurrence 5 x     Awake, alert Nods to questions Breathing spontaneously Moving all extremities to command, good strength  LABS: Lab Results  Component Value Date   CREATININE 0.81 03/25/2018   BUN 10 03/25/2018   NA 143 03/25/2018   K 3.4 (L) 03/25/2018   CL 114 (H) 03/25/2018   CO2 19 (L) 03/25/2018   Lab Results  Component Value Date   WBC 12.0 (H) 03/25/2018   HGB 8.1 (L) 03/25/2018   HCT 26.1 (L) 03/25/2018   MCV 97.8 03/25/2018   PLT 23 (LL) 03/25/2018    IMPRESSION: - 62 y.o. female [place] d# 3 s/p left PICA recanalized aneurysm coiling. Neurologically stable.  - Metastatic breast CA  PLAN: - Will cont with supportive care. Cont dex, likely another attempt at extubation in 2-3 days. - Cont Nimotop, TCD MWF.  I reviewed the situation with the patient's family. All their questions were answered. Will proceed for now with continued supportive care. Discussed the possibility of trach and long-term care if she fails extubation again v one-way extubation given her underlying medical condition. Will attempt to speak with her oncologist today.

## 2018-03-26 ENCOUNTER — Inpatient Hospital Stay (HOSPITAL_COMMUNITY): Payer: Medicaid Other

## 2018-03-26 DIAGNOSIS — I609 Nontraumatic subarachnoid hemorrhage, unspecified: Secondary | ICD-10-CM

## 2018-03-26 DIAGNOSIS — J9602 Acute respiratory failure with hypercapnia: Secondary | ICD-10-CM

## 2018-03-26 LAB — POCT I-STAT 3, ART BLOOD GAS (G3+)
Acid-base deficit: 4 mmol/L — ABNORMAL HIGH (ref 0.0–2.0)
Acid-base deficit: 3 mmol/L — ABNORMAL HIGH (ref 0.0–2.0)
BICARBONATE: 25.3 mmol/L (ref 20.0–28.0)
BICARBONATE: 23.6 mmol/L (ref 20.0–28.0)
O2 Saturation: 99 %
O2 Saturation: 96 %
PCO2 ART: 71.7 mmHg — AB (ref 32.0–48.0)
PCO2 ART: 54.1 mmHg — AB (ref 32.0–48.0)
PO2 ART: 158 mmHg — AB (ref 83.0–108.0)
PO2 ART: 100 mmHg (ref 83.0–108.0)
Patient temperature: 97.9
Patient temperature: 98.8
TCO2: 28 mmol/L (ref 22–32)
TCO2: 25 mmol/L (ref 22–32)
pH, Arterial: 7.154 — CL (ref 7.350–7.450)
pH, Arterial: 7.248 — ABNORMAL LOW (ref 7.350–7.450)

## 2018-03-26 LAB — GLUCOSE, CAPILLARY
Glucose-Capillary: 133 mg/dL — ABNORMAL HIGH (ref 70–99)
Glucose-Capillary: 164 mg/dL — ABNORMAL HIGH (ref 70–99)
Glucose-Capillary: 158 mg/dL — ABNORMAL HIGH (ref 70–99)
Glucose-Capillary: 146 mg/dL — ABNORMAL HIGH (ref 70–99)
Glucose-Capillary: 174 mg/dL — ABNORMAL HIGH (ref 70–99)
Glucose-Capillary: 210 mg/dL — ABNORMAL HIGH (ref 70–99)

## 2018-03-26 LAB — CBC
HCT: 28.6 % — ABNORMAL LOW (ref 36.0–46.0)
HEMOGLOBIN: 8.4 g/dL — AB (ref 12.0–15.0)
MCH: 30.1 pg (ref 26.0–34.0)
MCHC: 29.4 g/dL — ABNORMAL LOW (ref 30.0–36.0)
MCV: 102.5 fL — AB (ref 78.0–100.0)
Platelets: 32 10*3/uL — ABNORMAL LOW (ref 150–400)
RBC: 2.79 MIL/uL — AB (ref 3.87–5.11)
RDW: 19.9 % — ABNORMAL HIGH (ref 11.5–15.5)
WBC: 17.1 10*3/uL — ABNORMAL HIGH (ref 4.0–10.5)

## 2018-03-26 LAB — BASIC METABOLIC PANEL
ANION GAP: 8 (ref 5–15)
BUN: 19 mg/dL (ref 6–20)
CO2: 25 mmol/L (ref 22–32)
Calcium: 8.5 mg/dL — ABNORMAL LOW (ref 8.9–10.3)
Chloride: 114 mmol/L — ABNORMAL HIGH (ref 98–111)
Creatinine, Ser: 0.79 mg/dL (ref 0.44–1.00)
GFR calc Af Amer: >60 mL/min (ref >60)
GFR calc non Af Amer: >60 mL/min (ref >60)
Glucose, Bld: 158 mg/dL — ABNORMAL HIGH (ref 70–99)
POTASSIUM: 4 mmol/L (ref 3.5–5.1)
Sodium: 147 mmol/L — ABNORMAL HIGH (ref 135–145)

## 2018-03-26 LAB — BLOOD GAS, ARTERIAL
Acid-base deficit: 2.2 mmol/L — ABNORMAL HIGH (ref 0.0–2.0)
BICARBONATE: 23.7 mmol/L (ref 20.0–28.0)
Drawn by: 313941
FIO2: 40
LHR: 20 {breaths}/min
O2 Saturation: 97.9 %
PEEP: 5 cmH2O
PO2 ART: 111 mmHg — AB (ref 83.0–108.0)
Patient temperature: 98.8
VT: 440 mL
pCO2 arterial: 53.6 mmHg — ABNORMAL HIGH (ref 32.0–48.0)
pH, Arterial: 7.269 — ABNORMAL LOW (ref 7.350–7.450)

## 2018-03-26 LAB — MAGNESIUM: Magnesium: 2.4 mg/dL (ref 1.7–2.4)

## 2018-03-26 LAB — PHOSPHORUS: PHOSPHORUS: 5.3 mg/dL — AB (ref 2.5–4.6)

## 2018-03-26 MED ORDER — MIDAZOLAM BOLUS VIA INFUSION
1.0000 mg | INTRAVENOUS | Status: DC | PRN
  Administered 2018-04-02: 1 mg via INTRAVENOUS
  Filled 2018-03-26: qty 2

## 2018-03-26 MED ORDER — SODIUM CHLORIDE 0.9 % IV SOLN
3.0000 ug/kg/min | INTRAVENOUS | Status: DC
  Administered 2018-03-26 – 2018-03-29 (×5): 3 ug/kg/min via INTRAVENOUS
  Filled 2018-03-26 (×6): qty 20

## 2018-03-26 MED ORDER — SODIUM CHLORIDE 0.9 % IV SOLN
1.0000 mg/h | INTRAVENOUS | Status: DC
  Filled 2018-03-26: qty 10

## 2018-03-26 MED ORDER — MIDAZOLAM HCL 2 MG/2ML IJ SOLN
INTRAMUSCULAR | Status: AC
  Filled 2018-03-26: qty 2

## 2018-03-26 MED ORDER — CARVEDILOL 3.125 MG PO TABS
6.2500 mg | ORAL_TABLET | Freq: Two times a day (BID) | ORAL | Status: DC
  Administered 2018-03-26 – 2018-03-28 (×5): 6.25 mg
  Filled 2018-03-26 (×5): qty 2

## 2018-03-26 MED ORDER — FENTANYL CITRATE (PF) 100 MCG/2ML IJ SOLN: 100.0000 ug | Freq: Once | INTRAMUSCULAR | Status: DC | PRN

## 2018-03-26 MED ORDER — PANTOPRAZOLE SODIUM 40 MG PO PACK
40.0000 mg | PACK | ORAL | Status: DC
  Administered 2018-03-26 – 2018-04-03 (×9): 40 mg
  Filled 2018-03-26 (×9): qty 20

## 2018-03-26 MED ORDER — CISATRACURIUM BOLUS VIA INFUSION
0.0500 mg/kg | Freq: Once | INTRAVENOUS | Status: AC
  Administered 2018-03-26: 5.1 mg via INTRAVENOUS
  Filled 2018-03-26: qty 6

## 2018-03-26 MED ORDER — FENTANYL CITRATE (PF) 100 MCG/2ML IJ SOLN
100.0000 ug | Freq: Once | INTRAMUSCULAR | Status: AC
  Administered 2018-03-26: 100 ug via INTRAVENOUS

## 2018-03-26 MED ORDER — ARTIFICIAL TEARS OPHTHALMIC OINT
1.0000 "application " | TOPICAL_OINTMENT | Freq: Three times a day (TID) | OPHTHALMIC | Status: DC
  Administered 2018-03-26 – 2018-03-30 (×12): 1 via OPHTHALMIC
  Filled 2018-03-26: qty 3.5

## 2018-03-26 MED ORDER — MIDAZOLAM HCL 2 MG/2ML IJ SOLN
2.0000 mg | Freq: Once | INTRAMUSCULAR | Status: AC
  Administered 2018-03-26: 2 mg via INTRAVENOUS
  Filled 2018-03-26: qty 2

## 2018-03-26 MED ORDER — VECURONIUM BROMIDE 10 MG IV SOLR
10.0000 mg | Freq: Once | INTRAVENOUS | Status: AC
  Administered 2018-03-26: 10 mg via INTRAVENOUS
  Filled 2018-03-26: qty 10

## 2018-03-26 MED ORDER — NICARDIPINE HCL IN NACL 20-0.86 MG/200ML-% IV SOLN
5.0000 mg/h | INTRAVENOUS | Status: DC
  Administered 2018-03-26 (×3): 15 mg/h via INTRAVENOUS
  Administered 2018-03-26: 5 mg/h via INTRAVENOUS
  Administered 2018-03-26 – 2018-03-27 (×2): 12.5 mg/h via INTRAVENOUS
  Administered 2018-03-27: 15 mg/h via INTRAVENOUS
  Administered 2018-03-27: 10 mg/h via INTRAVENOUS
  Administered 2018-03-27 (×2): 12.5 mg/h via INTRAVENOUS
  Administered 2018-03-27: 15 mg/h via INTRAVENOUS
  Administered 2018-03-27 (×3): 12.5 mg/h via INTRAVENOUS
  Administered 2018-03-27 – 2018-03-28 (×2): 10 mg/h via INTRAVENOUS
  Administered 2018-03-28: 15 mg/h via INTRAVENOUS
  Administered 2018-03-28: 12.5 mg/h via INTRAVENOUS
  Administered 2018-03-28: 10 mg/h via INTRAVENOUS
  Administered 2018-03-28 (×2): 12.5 mg/h via INTRAVENOUS
  Administered 2018-03-28: 7.5 mg/h via INTRAVENOUS
  Administered 2018-03-28: 12.5 mg/h via INTRAVENOUS
  Administered 2018-03-29 (×3): 15 mg/h via INTRAVENOUS
  Filled 2018-03-26 (×2): qty 200
  Filled 2018-03-26: qty 400
  Filled 2018-03-26 (×13): qty 200
  Filled 2018-03-26 (×4): qty 400
  Filled 2018-03-26 (×3): qty 200
  Filled 2018-03-26: qty 400

## 2018-03-26 MED ORDER — IPRATROPIUM-ALBUTEROL 0.5-2.5 (3) MG/3ML IN SOLN
3.0000 mL | RESPIRATORY_TRACT | Status: DC
  Administered 2018-03-26: 3 mL via RESPIRATORY_TRACT
  Filled 2018-03-26: qty 3

## 2018-03-26 MED ORDER — METHYLPREDNISOLONE SODIUM SUCC 40 MG IJ SOLR
40.0000 mg | Freq: Three times a day (TID) | INTRAMUSCULAR | Status: DC
  Administered 2018-03-26 – 2018-03-28 (×7): 40 mg via INTRAVENOUS
  Filled 2018-03-26 (×7): qty 1

## 2018-03-26 MED ORDER — METOPROLOL TARTRATE 5 MG/5ML IV SOLN
2.5000 mg | INTRAVENOUS | Status: DC | PRN
  Administered 2018-03-26 – 2018-04-01 (×6): 5 mg via INTRAVENOUS
  Administered 2018-04-01: 2.5 mg via INTRAVENOUS
  Administered 2018-04-02 – 2018-04-09 (×3): 5 mg via INTRAVENOUS
  Filled 2018-03-26 (×11): qty 5

## 2018-03-26 MED ORDER — HYDRALAZINE HCL 20 MG/ML IJ SOLN
10.0000 mg | INTRAMUSCULAR | Status: DC | PRN
  Administered 2018-03-30 – 2018-04-11 (×13): 10 mg via INTRAVENOUS
  Filled 2018-03-26 (×13): qty 1

## 2018-03-26 MED ORDER — LEVETIRACETAM 100 MG/ML PO SOLN
1000.0000 mg | Freq: Two times a day (BID) | ORAL | Status: DC
  Administered 2018-03-26 – 2018-04-05 (×20): 1000 mg
  Filled 2018-03-26 (×21): qty 10

## 2018-03-26 MED ORDER — FENTANYL BOLUS VIA INFUSION
50.0000 ug | INTRAVENOUS | Status: DC | PRN
  Administered 2018-03-26: 50 ug via INTRAVENOUS
  Filled 2018-03-26: qty 50

## 2018-03-26 MED ORDER — CARVEDILOL 12.5 MG PO TABS: 25.0000 mg | ORAL_TABLET | Freq: Two times a day (BID) | ORAL | Status: DC

## 2018-03-26 MED ORDER — SODIUM CHLORIDE 0.9 % IV SOLN
1.0000 g | INTRAVENOUS | Status: DC
  Administered 2018-03-26 – 2018-03-30 (×5): 1 g via INTRAVENOUS
  Filled 2018-03-26 (×5): qty 10

## 2018-03-26 MED ORDER — MIDAZOLAM HCL 2 MG/2ML IJ SOLN
2.0000 mg | INTRAMUSCULAR | Status: DC | PRN
  Administered 2018-03-26: 2 mg via INTRAVENOUS

## 2018-03-26 MED ORDER — FENTANYL BOLUS VIA INFUSION
50.0000 ug | INTRAVENOUS | Status: DC | PRN
  Filled 2018-03-26: qty 50

## 2018-03-26 MED ORDER — PROPOFOL 1000 MG/100ML IV EMUL
25.0000 ug/kg/min | INTRAVENOUS | Status: DC
  Administered 2018-03-26 (×2): 80 ug/kg/min via INTRAVENOUS
  Administered 2018-03-26 – 2018-03-27 (×3): 40 ug/kg/min via INTRAVENOUS
  Filled 2018-03-26 (×8): qty 100

## 2018-03-26 MED ORDER — ALBUTEROL SULFATE (2.5 MG/3ML) 0.083% IN NEBU
2.5000 mg | INHALATION_SOLUTION | RESPIRATORY_TRACT | Status: DC | PRN
  Administered 2018-03-26: 2.5 mg via RESPIRATORY_TRACT
  Filled 2018-03-26: qty 3

## 2018-03-26 MED ORDER — SODIUM CHLORIDE 0.9 % IV SOLN
0.0000 mg/h | INTRAVENOUS | Status: DC
  Administered 2018-03-26: 2 mg/h via INTRAVENOUS
  Administered 2018-03-27: 1 mg/h via INTRAVENOUS
  Administered 2018-03-28: 3 mg/h via INTRAVENOUS
  Administered 2018-03-29 (×2): 7 mg/h via INTRAVENOUS
  Administered 2018-03-29: 5 mg/h via INTRAVENOUS
  Administered 2018-03-30: 4 mg/h via INTRAVENOUS
  Administered 2018-03-31: 1 mg/h via INTRAVENOUS
  Administered 2018-04-01: 3 mg/h via INTRAVENOUS
  Filled 2018-03-26 (×10): qty 10

## 2018-03-26 MED ORDER — LABETALOL HCL 5 MG/ML IV SOLN
10.0000 mg | INTRAVENOUS | Status: AC | PRN
  Administered 2018-03-29 – 2018-04-01 (×10): 10 mg via INTRAVENOUS
  Filled 2018-03-26 (×11): qty 4

## 2018-03-26 MED ORDER — IPRATROPIUM-ALBUTEROL 0.5-2.5 (3) MG/3ML IN SOLN
3.0000 mL | Freq: Four times a day (QID) | RESPIRATORY_TRACT | Status: DC
  Administered 2018-03-26 – 2018-04-04 (×36): 3 mL via RESPIRATORY_TRACT
  Filled 2018-03-26 (×36): qty 3

## 2018-03-26 MED ORDER — MIDAZOLAM HCL 2 MG/2ML IJ SOLN
2.0000 mg | Freq: Once | INTRAMUSCULAR | Status: AC
  Administered 2018-03-26: 2 mg via INTRAVENOUS

## 2018-03-26 MED ORDER — FENTANYL 2500MCG IN NS 250ML (10MCG/ML) PREMIX INFUSION
0.0000 ug/h | INTRAVENOUS | Status: DC
  Administered 2018-03-26: 40 ug/h via INTRAVENOUS
  Administered 2018-03-28: 300 ug/h via INTRAVENOUS
  Administered 2018-03-29: 375 ug/h via INTRAVENOUS
  Administered 2018-03-29: 350 ug/h via INTRAVENOUS
  Administered 2018-03-29: 400 ug/h via INTRAVENOUS
  Administered 2018-03-29: 325 ug/h via INTRAVENOUS
  Administered 2018-03-30: 300 ug/h via INTRAVENOUS
  Filled 2018-03-26 (×7): qty 250

## 2018-03-26 NOTE — Progress Notes (Signed)
Transcranial Doppler  Date POD PCO2 HCT BP  MCA ACA PCA OPHT SIPH VERT Basilar  03/24/18 vs     Right  Left   92  13   -58  -52   19  *   28  *   137  *   93  *   90      9/13 MS     Right  Left   124  34   -70  *   -39  *   *  12   *  -26   -36  -53   -61           Right  Left                                             Right  Left                                             Right  Left                                            Right  Left                                            Right  Left                                        MCA = Middle Cerebral Artery      OPHT = Opthalmic Artery     BASILAR = Basilar Artery   ACA = Anterior Cerebral Artery     SIPH = Carotid Siphon PCA = Posterior Cerebral Artery   VERT = Verterbral Artery                   Normal MCA = 62+\-12 ACA = 50+\-12 PCA = 42+\-23   03/24/2018 * Not found Lind[place]ard ratio - Rt 0.99 Lt 0.12  03/26/18 *Unable to insonate Lindegaard ratio- Rt 2.95 Lt 0.55   03/26/2018 4:52 PM Maudry Mayhew, MHA, RVT, RDCS, RDMS

## 2018-03-26 NOTE — Progress Notes (Addendum)
Spoke to E-Link MD about patient being non synchronous with ventilator. New orders received for another dose of versed. Will continue to monitor.

## 2018-03-26 NOTE — Progress Notes (Signed)
Patient HR 124. Notified Elink MD Dr. Jimmy Footman at [id number]. Awaiting MD med order. RN will continue to monitor.

## 2018-03-26 NOTE — Progress Notes (Signed)
Pt paralyzed and is more comfortable/synchronized on the vent.  Bilateral breath sounds much better.

## 2018-03-26 NOTE — Progress Notes (Addendum)
Spoke with E-Link MD and had MD camera into room. Patient is not synchronous with ventilator. Patient having long exhalations and low tidal volumes. New orders received for versed. Will continue to monitor.

## 2018-03-26 NOTE — Progress Notes (Signed)
[place] Progress Note Patient Name: Diane Short DOB: 1955-08-09 MRN: [id number]   Date of Service  03/26/2018  HPI/Events of Note  Despite heavy sedation patient still with high airway pressures, wheezing, and prolonged expiration.  Difficulty ventilating.  eICU Interventions  Dose of paralytic. Return to [facility] mode of ventilation ABG in 1 hour     Intervention Category Major Interventions: Respiratory failure - evaluation and management  Mauri Brooklyn, P 03/26/2018, 6:43 AM

## 2018-03-26 NOTE — Progress Notes (Signed)
PULMONARY / CRITICAL CARE MEDICINE   NAME:  Diane Short, MRN:  [id number], DOB:  1955/12/17, LOS: 4 ADMISSION DATE:  03/22/2018, CONSULTATION DATE: 03/22/2018 REFERRING MD:  Dr. Leonette Monarch, ER, CHIEF COMPLAINT:  Headache  BRIEF HISTORY:    62 yo female smoker with hx of cerebral aneurysms s/p intervention presented with persistent headache, and altered mental status.  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  Intubated for airway protection.  Nimodipine started.  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  SIGNIFICANT PAST MEDICAL HISTORY   Stage 4 Breast cancer, Neuropathy, HTN, Hiatal hernia, GERD, Depression  SIGNIFICANT EVENTS:  9/09 Admit 9/10 cerebral angiogram with coiling 9/11 extubated >> reintubated due to stridor and start steroids; start ABx for UTI, aspiration 9/13 wheezing, respiratory acidosis >> paralytic x 1, deep sedation  STUDIES:   CT head 9/09 >> SAH, mild hydrocephalus EEG 9/09 >> slowing CT head 9/10 >> stable SAH and IVH Echo 9/10 >> left ventricular EF 55 to 60%.  Grade 1 diastolic dysfunction.  CULTURES:  Urine 9/09 >> E coli Sputum 9/09 >> oral flora Blood 9/09 >>   ANTIBIOTICS:  Zosyn 9/11 >> 9/13 Rocephin 9/13 >>   LINES/TUBES:  Rt port >> ETT 9/09 >> 9/11 ETT 9/11 >> Rt radial aline 9/13 >>   CONSULTANTS:  Neurosurgery 9/09 >>  SUBJECTIVE:  Remains on heavy sedation  CONSTITUTIONAL: BP (!) 142/60   Pulse 85   Temp 97.9 F (36.6 C) (Axillary)   Resp 20   Ht _0  (1.626 m)   Wt 102 kg   LMP 08/14/2010 (Exact Date)   SpO2 100%   BMI 38.60 kg/m   I/O last 3 completed shifts: In: 4691.5 [I.V.:3196.9; NG/GT:1112.5; IV Piggyback:382.1] Out: 2785 [Urine:2295; Emesis/NG output:90; Stool:400]     Vent Mode: PRVC FiO2 (%):  [40 %-50 %] 50 % Set Rate:  [20 bmp] 20 bmp Vt Set:  [440 mL] 440 mL PEEP:  [5 cmH20] 5 cmH20 Plateau Pressure:  [15 ZTI45-80 cmH20] 19 cmH20  PHYSICAL EXAM:  General -  sedated Eyes - pupils reactive ENT - ETT in place Cardiac - regular rate/rhythm, no murmur Chest - prolonged exhalation, no wheeze Abdomen - soft, non tender, + bowel sounds Extremities - 1+ edema Skin - no rashes Neuro - RASS -3  CXR 9/13 (reviewed by me) >> low lung volumes   ASSESSMENT AND PLAN    Acute respiratory failure with hypoxia, hypercapnia Postextubation stridor 9/11. History of tobacco abuse with acute bronchospasm 9/13. - full vent support - solumedrol for stridor and bronchospasm - scheduled BDs - f/u CXR, ABG  SAH s/p coiling and embolization of PICA aneurysm 9/10. - continue nimotop - keppra for seizure prophylaxis - goal SBP < 998  Acute metabolic encephalopathy. - RASS goal -2 to -3 until respiratory status stable  Aspiration pneumonia. E coli UTI. - day 3 of Abx, change to Rocephin  Hx of HTN. - coreg - prn hydralazine  Stage 4 breast cancer s/p mastectomy, XRT to brain met, and recently on chemo. Chemo induced anemia, thrombocytopenia. - followed by oncology in [place] - f/u CBC  Hyperglycemia. - SSI  SUMMARY OF TODAY'S PLAN:  Keep deeply sedated.  Scheduled BDs and steroids.  Suspect she will need to remain on vent support through the weekend.  Will need to determine long term options for breast cancer given her likely prolonged recovery from [facility].    Best Practice / Goals of  Care / Disposition.   DVT PROPHYLAXIS: SCDs SUP: protonix NUTRITION: tube feeds MOBILITY: bed rest GOALS OF CARE: full code FAMILY DISCUSSIONS:  Updated family at bedside 9/13   LABS  Glucose Recent Labs  Lab 03/25/18 1150 03/25/18 1509 03/25/18 1913 03/25/18 2323 03/26/18 0317 03/26/18 0755  GLUCAP 164* 155* 159* 168* 133* 164*    BMET Recent Labs  Lab 03/24/18 0542 03/25/18 0555 03/26/18 0550  NA 141 143 147*  K 3.8 3.4* 4.0  CL 111 114* 114*  CO2 19* 19* 25  BUN _0 CREATININE 0.63 0.81 0.79  GLUCOSE 162* 210* 158*    Liver  Enzymes Recent Labs  Lab 03/22/18 0539  AST 33  ALT 35  ALKPHOS 76  BILITOT 0.5  ALBUMIN 3.2*    Electrolytes Recent Labs  Lab 03/24/18 0542 03/24/18 1714 03/25/18 0555 03/26/18 0550  CALCIUM 7.8*  --  7.9* 8.5*  MG 2.0 1.9  --  2.4  PHOS 2.3* 2.6  --  5.3*    CBC Recent Labs  Lab 03/24/18 0542 03/24/18 1717 03/25/18 0555 03/26/18 0550  WBC 9.1  --  12.0* 17.1*  HGB 6.8* 8.2* 8.1* 8.4*  HCT 21.8* 26.4* 26.1* 28.6*  PLT 21*  --  23* 32*    ABG Recent Labs  Lab 03/26/18 0356 03/26/18 0631 03/26/18 0743  PHART 7.269* 7.154* 7.248*  PCO2ART 53.6* 71.7* 54.1*  PO2ART 111* 158.0* 100.0    Coag's Recent Labs  Lab 03/22/18 0924  INR 1.04    Sepsis Markers No results for input(s): LATICACIDVEN, PROCALCITON, O2SATVEN in the last 168 hours.  Cardiac Enzymes No results for input(s): TROPONINI, PROBNP in the last 168 hours.  CC time 34 minutes  Chesley Mires, MD [facility] Pulmonary/Critical Care 03/26/2018, 8:21 AM

## 2018-03-26 NOTE — Progress Notes (Signed)
  NEUROSURGERY PROGRESS NOTE   Pt seen and examined. Pt developed significant vent dysynchrony last night requiring sedation, paralytic.  EXAM: Temp:  [97.9 F (36.6 C)-100.4 F (38 C)] 98.4 F (36.9 C) (09/13 1200) Pulse Rate:  [75-113] 113 (09/13 1800) Resp:  [5-27] 20 (09/13 1800) BP: (130-194)/(49-93) 153/50 (09/13 1800) SpO2:  [87 %-100 %] 96 % (09/13 1800) Arterial Line BP: (139-197)/(60-82) 183/61 (09/13 1800) FiO2 (%):  [40 %-50 %] 40 % (09/13 1650) Intake/Output      09/12 0701 - 09/13 0700 09/13 0701 - 09/14 0700   I.V. (mL/kg) 2085.3 (20.4) 1082.6 (10.6)   Blood     NG/GT 820    IV Piggyback 338.5 137.4   Total Intake(mL/kg) 3243.7 (31.8) 1220 (12)   Urine (mL/kg/hr) 1610 (0.7) 475 (0.4)   Emesis/NG output     Stool 300    Total Output 1910 475   Net +1333.7 +745         Currently opens eyes to voice Moves spontaneously BUE, not currently FC  LABS: Lab Results  Component Value Date   CREATININE 0.79 03/26/2018   BUN 19 03/26/2018   NA 147 (H) 03/26/2018   K 4.0 03/26/2018   CL 114 (H) 03/26/2018   CO2 25 03/26/2018   Lab Results  Component Value Date   WBC 17.1 (H) 03/26/2018   HGB 8.4 (L) 03/26/2018   HCT 28.6 (L) 03/26/2018   MCV 102.5 (H) 03/26/2018   PLT 32 (L) 03/26/2018    IMPRESSION: - 62 y.o. female SAH d# 4 s/p coiling recanalized LPICA aneurysm. Appeared neurologically well prior to sedation/paralytic for vent synchrony - Metastatic breast CA. I did speak with the patient's oncologist yesterday who indicated that her disease is relatively slowly progressive and that it is possible she could survive a year or longer without any active treatment. She suggested that if there is a meaningful chance of recovery from the [facility] even with possible trach/PEG etc, it wouldn't be unreasonable to pursue such a course.  PLAN: - Will cont supportive care - TCD today - vent mgmt per PCCM - Nimotop

## 2018-03-26 NOTE — Progress Notes (Signed)
Pt is dyssynchronous with vent at this time and has expiratory wheezes.  She is taking several small breathes with decreased VTe.  RN notified and MD paged.

## 2018-03-26 NOTE — Progress Notes (Signed)
[place] Progress Note Patient Name: Diane Short DOB: 10-06-1955 MRN: [id number]   Date of Service  03/26/2018  HPI/Events of Note  In early morning hours patient developed elevated airway pressures with prolonged expiration and wheezing.    eICU Interventions  Sedation increased Albuterol treatments changed from every 6 hrs to every 4 hrs Patient has been changed to pressure control mode of ventilation with plan for repeat abg in 1 hour     Intervention Category Intermediate Interventions: Respiratory distress - evaluation and management  Mauri Brooklyn, P 03/26/2018, 5:34 AM

## 2018-03-27 ENCOUNTER — Inpatient Hospital Stay (HOSPITAL_COMMUNITY): Payer: Medicaid Other

## 2018-03-27 DIAGNOSIS — J9600 Acute respiratory failure, unspecified whether with hypoxia or hypercapnia: Secondary | ICD-10-CM

## 2018-03-27 LAB — BASIC METABOLIC PANEL
Anion gap: 9 (ref 5–15)
BUN: 30 mg/dL — AB (ref 6–20)
CHLORIDE: 117 mmol/L — AB (ref 98–111)
CO2: 22 mmol/L (ref 22–32)
CREATININE: 0.73 mg/dL (ref 0.44–1.00)
Calcium: 8.2 mg/dL — ABNORMAL LOW (ref 8.9–10.3)
GFR calc non Af Amer: >60 mL/min (ref >60)
Glucose, Bld: 236 mg/dL — ABNORMAL HIGH (ref 70–99)
Potassium: 3.4 mmol/L — ABNORMAL LOW (ref 3.5–5.1)
Sodium: 148 mmol/L — ABNORMAL HIGH (ref 135–145)

## 2018-03-27 LAB — BLOOD GAS, ARTERIAL
Acid-base deficit: 1.7 mmol/L (ref 0.0–2.0)
Bicarbonate: 24 mmol/L (ref 20.0–28.0)
DRAWN BY: 39899
FIO2: 70
O2 Saturation: 95.6 %
PCO2 ART: 51.3 mmHg — AB (ref 32.0–48.0)
PEEP: 10 cmH2O
Patient temperature: 98
RATE: 20 resp/min
VT: 440 mL
pH, Arterial: 7.29 — ABNORMAL LOW (ref 7.350–7.450)
pO2, Arterial: 81.8 mmHg — ABNORMAL LOW (ref 83.0–108.0)

## 2018-03-27 LAB — CBC
HCT: 27.4 % — ABNORMAL LOW (ref 36.0–46.0)
Hemoglobin: 8.2 g/dL — ABNORMAL LOW (ref 12.0–15.0)
MCH: 30.5 pg (ref 26.0–34.0)
MCHC: 29.9 g/dL — AB (ref 30.0–36.0)
MCV: 101.9 fL — AB (ref 78.0–100.0)
PLATELETS: 58 10*3/uL — AB (ref 150–400)
RBC: 2.69 MIL/uL — AB (ref 3.87–5.11)
RDW: 19.8 % — AB (ref 11.5–15.5)
WBC: 19.5 10*3/uL — ABNORMAL HIGH (ref 4.0–10.5)

## 2018-03-27 LAB — CULTURE, BLOOD (ROUTINE X 2)
CULTURE: NO GROWTH
CULTURE: NO GROWTH
Special Requests: ADEQUATE

## 2018-03-27 LAB — GLUCOSE, CAPILLARY
GLUCOSE-CAPILLARY: 162 mg/dL — AB (ref 70–99)
GLUCOSE-CAPILLARY: 205 mg/dL — AB (ref 70–99)
Glucose-Capillary: 238 mg/dL — ABNORMAL HIGH (ref 70–99)
Glucose-Capillary: 235 mg/dL — ABNORMAL HIGH (ref 70–99)
Glucose-Capillary: 231 mg/dL — ABNORMAL HIGH (ref 70–99)
Glucose-Capillary: 207 mg/dL — ABNORMAL HIGH (ref 70–99)

## 2018-03-27 LAB — TRIGLYCERIDES: TRIGLYCERIDES: 371 mg/dL — AB (ref <150)

## 2018-03-27 MED ORDER — FUROSEMIDE 10 MG/ML IJ SOLN
80.0000 mg | Freq: Three times a day (TID) | INTRAMUSCULAR | Status: DC
  Administered 2018-03-27 – 2018-03-28 (×3): 80 mg via INTRAVENOUS
  Filled 2018-03-27 (×3): qty 8

## 2018-03-27 MED ORDER — POTASSIUM CHLORIDE 20 MEQ/15ML (10%) PO SOLN
40.0000 meq | Freq: Once | ORAL | Status: AC
  Administered 2018-03-27: 40 meq
  Filled 2018-03-27: qty 30

## 2018-03-27 MED ORDER — POTASSIUM CHLORIDE 20 MEQ/15ML (10%) PO SOLN
40.0000 meq | Freq: Two times a day (BID) | ORAL | Status: DC
  Administered 2018-03-27 – 2018-03-28 (×4): 40 meq
  Filled 2018-03-27 (×4): qty 30

## 2018-03-27 NOTE — Progress Notes (Signed)
PULMONARY / CRITICAL CARE MEDICINE   NAME:  Diane Short, MRN:  [id number], DOB:  Jul 09, 1956, LOS: 5 ADMISSION DATE:  03/22/2018, CONSULTATION DATE: 03/22/2018 REFERRING MD:  Dr. Leonette Monarch, ER, CHIEF COMPLAINT:  Headache  BRIEF HISTORY:    62 yo female smoker with hx of cerebral aneurysms s/p intervention presented with persistent headache, and altered mental status.  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  Intubated for airway protection.  Nimodipine started.  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  SIGNIFICANT PAST MEDICAL HISTORY   Stage 4 Breast cancer, Neuropathy, HTN, Hiatal hernia, GERD, Depression  SIGNIFICANT EVENTS:  9/09 Admit 9/10 cerebral angiogram with coiling 9/11 extubated >> reintubated due to stridor and start steroids; start ABx for UTI, aspiration 9/13 wheezing, respiratory acidosis >> paralytic x 1, deep sedation. STarted on nimbex  STUDIES:   CT head 9/09 >> SAH, mild hydrocephalus EEG 9/09 >> slowing CT head 9/10 >> stable SAH and IVH Echo 9/10 >> left ventricular EF 55 to 60%.  Grade 1 diastolic dysfunction.  CULTURES:  Urine 9/09 >> E coli Sputum 9/09 >> oral flora Blood 9/09 >>   ANTIBIOTICS:  Zosyn 9/11 >> 9/13 Rocephin 9/13 >>   LINES/TUBES:  Rt port >> ETT 9/09 >> 9/11 ETT 9/11 >> Rt radial aline 9/13 >>   CONSULTANTS:  Neurosurgery 9/09 >>  SUBJECTIVE:  03/27/2018 - on nimbex since yesterday due to vent dynschorny. On fent, versed and diprivan. BIS 40-60. Pulse ox 89%, 45% fio2, peep - 5, On Cardene  For goal sbp < 160. Currently 135. On tube feeds. Lot of 3rd spacing.  +. Rt hand bruising - worse per RN but pulse rt radial, rt ulnar and cap refill on right hand   CONSTITUTIONAL: BP (!) 129/50   Pulse 92   Temp 98.5 F (36.9 C) (Axillary)   Resp 20   Ht '5\' 4"'  (1.626 m)   Wt 102 kg   LMP 08/14/2010 (Exact Date)   SpO2 93%   BMI 38.60 kg/m   I/O last 3 completed shifts: In: 6579.3 [I.V.:5209;  NG/GT:1070; IV Piggyback:300.3] Out: 2960 [Urine:2160; Stool:800]     Vent Mode: PRVC FiO2 (%):  [40 %-50 %] 50 % Set Rate:  [20 bmp] 20 bmp Vt Set:  [440 mL] 440 mL PEEP:  [5 cmH20] 5 cmH20 Plateau Pressure:  [17 cmH20-23 cmH20] 23 cmH20  PHYSICAL EXAM: General Appearance:  Looks criticall ill OBESE - + Head:  Normocephalic, without obvious abnormality, atraumatic Eyes:  PERRL - yes, conjunctiva/corneas - clear     Ears:  Normal external ear canals, both ears Nose:  G tube - no Throat:  ETT TUBE - yes , OG tube - yes with TF Neck:  Supple,  No enlargement/tenderness/nodules Lungs: Clear to auscultation bilaterally, Ventilator   Synchrony - yes Heart:  S1 and S2 normal, no murmur, CVP - no.  Pressors - no Abdomen:  Soft, no masses, no organomegaly Genitalia / Rectal:  Not done Extremities:  Extremities- Rt hand duskyu and bruised but good cap refill, rt radial in place, rt ulnar palpable, good cap erfill Skin:  ntact in exposed areas . Sacral area - x Neurologic:  Sedation - fent,versed, diprivan , nimbex -> RASS - -5/BIS < 60\      LABS    PULMONARY Recent Labs  Lab 03/23/18 0330 03/24/18 0332 03/24/18 1212 03/26/18 0356 03/26/18 0631 03/26/18 0743 03/27/18 0313  PHART 7.368 7.313* 7.244* 7.269*  7.154* 7.248* 7.233*  PCO2ART 37.2 41.4 57.4* 53.6* 71.7* 54.1* 58.1*  PO2ART 76.0* 106.0 199.0* 111* 158.0* 100.0 63.1*  HCO3 21.3 20.8 24.7 23.7 25.3 23.6 23.7  TCO2 '22 22 26  ' --  28 25  --   O2SAT 94.0 97.0 100.0 97.9 99.0 96.0 88.6    CBC Recent Labs  Lab 03/25/18 0555 03/26/18 0550 03/27/18 0519  HGB 8.1* 8.4* 8.2*  HCT 26.1* 28.6* 27.4*  WBC 12.0* 17.1* 19.5*  PLT 23* 32* 58*    COAGULATION Recent Labs  Lab 03/22/18 0924  INR 1.04    CARDIAC  No results for input(s): TROPONINI in the last 168 hours. No results for input(s): PROBNP in the last 168 hours.   CHEMISTRY Recent Labs  Lab 03/23/18 0419 03/23/18 0924 03/23/18 1810 03/24/18 0542  03/24/18 1714 03/25/18 0555 03/26/18 0550 03/27/18 0519  NA 140  --   --  141  --  143 147* 148*  K 3.7  --   --  3.8  --  3.4* 4.0 3.4*  CL 109  --   --  111  --  114* 114* 117*  CO2 21*  --   --  19*  --  19* 25 22  GLUCOSE 117*  --   --  162*  --  210* 158* 236*  BUN 8  --   --  8  --  10 19 30*  CREATININE 0.93  --   --  0.63  --  0.81 0.79 0.73  CALCIUM 8.1*  --   --  7.8*  --  7.9* 8.5* 8.2*  MG 1.4* 2.2 1.9 2.0 1.9  --  2.4  --   PHOS 3.2 2.8 2.8 2.3* 2.6  --  5.3*  --    Estimated Creatinine Clearance: 86.9 mL/min (by C-G formula based on SCr of 0.73 mg/dL).   LIVER Recent Labs  Lab 03/22/18 0539 03/22/18 0924  AST 33  --   ALT 35  --   ALKPHOS 76  --   BILITOT 0.5  --   PROT 5.8*  --   ALBUMIN 3.2*  --   INR  --  1.04     INFECTIOUS No results for input(s): LATICACIDVEN, PROCALCITON in the last 168 hours.   ENDOCRINE CBG (last 3)  Recent Labs    03/27/18 0313 03/27/18 0749 03/27/18 1131  GLUCAP 238* 235* 231*         IMAGING x48h  - image(s) personally visualized  -   highlighted in bold Dg Chest Port 1 View  Result Date: 03/27/2018 CLINICAL DATA:  Respiratory failure EXAM: PORTABLE CHEST 1 VIEW COMPARISON:  Yesterday FINDINGS: Endotracheal tube tip at the clavicular heads. An orogastric tube tip reaches the stomach. Porta catheter on the right with high loop in the neck. Tip is at the distal SVC. Low volumes. There is no edema, consolidation, effusion, or pneumothorax. Normal heart size and mediastinal contours. IMPRESSION: 1. Stable hardware positioning. 2. Stable low volume chest. Electronically Signed   By: Monte Fantasia M.D.   On: 03/27/2018 08:47   Dg Chest Port 1 View  Result Date: 03/26/2018 CLINICAL DATA:  Other complication of respirator. EXAM: PORTABLE CHEST 1 VIEW COMPARISON:  Radiograph 03/24/2018 FINDINGS: Endotracheal tube tip at the thoracic inlet. Enteric tube tip and side-port below the diaphragm. Right chest port with tip in  the SVC. Unchanged heart size and mediastinal contours. Unchanged bibasilar atelectasis. No pulmonary edema, large pleural effusion or pneumothorax. IMPRESSION: 1. Unchanged  support apparatus in satisfactory position. 2. Low lung volumes with mild bibasilar atelectasis. Electronically Signed   By: Keith Rake M.D.   On: 03/26/2018 05:48     Recent Results (from the past 240 hour(s))  MRSA PCR Screening     Status: None   Collection Time: 03/22/18  9:29 AM  Result Value Ref Range Status   MRSA by PCR NEGATIVE NEGATIVE Final    Comment:        The GeneXpert MRSA Assay (FDA approved for NASAL specimens only), is one component of a comprehensive MRSA colonization surveillance program. It is not intended to diagnose MRSA infection nor to guide or monitor treatment for MRSA infections. Performed at [facility], [place] [address] [place], [place] [address]   Culture, respiratory (non-expectorated)     Status: None   Collection Time: 03/22/18  3:25 PM  Result Value Ref Range Status   Specimen Description ENDOTRACHEAL  Final   Special Requests NONE  Final   Gram Stain   Final    RARE WBC PRESENT, PREDOMINANTLY PMN FEW GRAM POSITIVE COCCI RARE GRAM POSITIVE RODS    Culture   Final    Consistent with normal respiratory flora. Performed at [facility], [place] [address], [place], [place] [address]    Report Status 03/24/2018 FINAL  Final  Culture, blood (Routine X 2) w Reflex to ID Panel     Status: None   Collection Time: 03/22/18  4:19 PM  Result Value Ref Range Status   Specimen Description BLOOD RIGHT FOOT  Final   Special Requests   Final    BOTTLES DRAWN AEROBIC ONLY Blood Culture results may not be optimal due to an inadequate volume of blood received in culture bottles   Culture   Final    NO GROWTH 5 DAYS Performed at [facility], [place] [address] [place], [place] [address]    Report Status 03/27/2018 FINAL  Final  Culture, blood (Routine X 2) w Reflex  to ID Panel     Status: None   Collection Time: 03/22/18  4:24 PM  Result Value Ref Range Status   Specimen Description BLOOD LEFT FOOT  Final   Special Requests   Final    BOTTLES DRAWN AEROBIC ONLY Blood Culture adequate volume   Culture   Final    NO GROWTH 5 DAYS Performed at [facility], [place] [address], [place], [place] [address]    Report Status 03/27/2018 FINAL  Final  Culture, Urine     Status: Abnormal   Collection Time: 03/22/18  4:33 PM  Result Value Ref Range Status   Specimen Description URINE, RANDOM  Final   Special Requests   Final    NONE Performed at [facility], [place] [address], [place], [place] [address]    Culture >=100,000 COLONIES/mL ESCHERICHIA COLI (A)  Final   Report Status 03/24/2018 FINAL  Final   Organism ID, Bacteria ESCHERICHIA COLI (A)  Final      Susceptibility   Escherichia coli - MIC*    AMPICILLIN <=2 SENSITIVE Sensitive     CEFAZOLIN <=4 SENSITIVE Sensitive     CEFTRIAXONE <=1 SENSITIVE Sensitive     CIPROFLOXACIN <=0.25 SENSITIVE Sensitive     GENTAMICIN <=1 SENSITIVE Sensitive     IMIPENEM <=0.25 SENSITIVE Sensitive     NITROFURANTOIN <=16 SENSITIVE Sensitive     TRIMETH/SULFA <=20 SENSITIVE Sensitive     AMPICILLIN/SULBACTAM <=2 SENSITIVE Sensitive  PIP/TAZO <=4 SENSITIVE Sensitive     Extended ESBL NEGATIVE Sensitive     * >=100,000 COLONIES/mL ESCHERICHIA COLI     ASSESSMENT AND PLAN   History of tobacco abuse Acute respiratory failure with hypoxia, hypercapnia Postextubation stridor 9/11.-  with acute bronchospasm 9/13. And reintubated. 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 - continue nimotop - keppra for seizure  prophylaxis - goal SBP < 007  Acute metabolic encephalopathy. - currently sedated and paralyzed  Volume overload - gross on 03/27/18  - start diuresis aggressive   Aspiration pneumonia. E coli UTI. - day 4 of Abx;  Rocephin  Hx of HTN. - coreg - prn hydralazine  Stage 4 breast cancer s/p mastectomy, XRT to brain met, and recently on chemo. Chemo induced anemia, thrombocytopenia. - followed by oncology in [place] - f/u CBC  Hyperglycemia. - SSI  SUMMARY OF TODAY'S PLAN:  Keep deeply sedated.  Scheduled BDs and steroids. STart diuresis 03/27/2018 and monitor her hand discoloration etc.,  Suspect she will need to remain on vent support through the weekend.  Will need to determine long term options for breast cancer given her likely prolonged recovery from [facility].    Best Practice / Goals of Care / Disposition.   DVT PROPHYLAXIS: SCDs SUP: protonix NUTRITION: tube feeds MOBILITY: bed rest GOALS OF CARE: full code FAMILY DISCUSSIONS:  Updated family at bedside 9/13. Husband updated 03/27/18    ATTESTATION & SIGNATURE   The patient is critically ill with multiple organ systems failure and requires high complexity decision making for assessment and support, frequent evaluation and titration of therapies, application of advanced monitoring technologies and extensive interpretation of multiple databases.   Critical Care Time devoted to patient care services described in this note is  30  Minutes. This time reflects time of care of this signee Dr Brand Males. This critical care time does not reflect procedure time, or teaching time or supervisory time of PA/NP/Med student/Med Resident etc but could involve care discussion time     Dr. Brand Males, M.D., [facility].C.P Pulmonary and Critical Care Medicine Staff Physician [facility] Pager: [phone], If no answer or between  15:00h - 7:00h: call [phone]  03/27/2018 12:55  PM

## 2018-03-27 NOTE — Progress Notes (Signed)
Neurosurgery Service Progress Note  Subjective: No acute events overnight, still on paralytics for significant vent asynchrony.    Objective: Vitals:   03/27/18 0400 03/27/18 0500 03/27/18 0700 03/27/18 0753  BP: (!) 143/55 (!) 146/54 (!) 155/63 (!) 150/61  Pulse: (!) 117 (!) 117 (!) 114 (!) 114  Resp: '20 20 20 20  ' Temp:      TempSrc:      SpO2: 93% 95% 93% 96%  Weight:      Height:       Temp (24hrs), Avg:98.3 F (36.8 C), Min:98 F (36.7 C), Max:98.5 F (36.9 C)  CBC Latest Ref Rng & Units 03/27/2018 03/26/2018 03/25/2018  WBC 4.0 - 10.5 K/uL 19.5(H) 17.1(H) 12.0(H)  Hemoglobin 12.0 - 15.0 g/dL 8.2(L) 8.4(L) 8.1(L)  Hematocrit 36.0 - 46.0 % 27.4(L) 28.6(L) 26.1(L)  Platelets 150 - 400 K/uL 58(L) 32(L) 23(LL)   BMP Latest Ref Rng & Units 03/27/2018 03/26/2018 03/25/2018  Glucose 70 - 99 mg/dL 236(H) 158(H) 210(H)  BUN 6 - 20 mg/dL 30(H) 19 10  Creatinine 0.44 - 1.00 mg/dL 0.73 0.79 0.81  Sodium 135 - 145 mmol/L 148(H) 147(H) 143  Potassium 3.5 - 5.1 mmol/L 3.4(L) 4.0 3.4(L)  Chloride 98 - 111 mmol/L 117(H) 114(H) 114(H)  CO2 22 - 32 mmol/L 22 25 19(L)  Calcium 8.9 - 10.3 mg/dL 8.2(L) 8.5(L) 7.9(L)    Intake/Output Summary (Last 24 hours) at 03/27/2018 0824 Last data filed at 03/27/2018 0400 Gross per 24 hour  Intake 3957.04 ml  Output 1950 ml  Net 2007.04 ml    Current Facility-Administered Medications:  .  0.9 %  sodium chloride infusion, , Intravenous, Continuous, Chesley Mires, MD, Last Rate: 50 mL/hr at 03/27/18 0400 .  acetaminophen (TYLENOL) tablet 650 mg, 650 mg, Oral, Q4H PRN, 650 mg at 03/25/18 0400 **OR** acetaminophen (TYLENOL) solution 650 mg, 650 mg, Per Tube, Q4H PRN, 650 mg at 03/26/18 0000 **OR** acetaminophen (TYLENOL) suppository 650 mg, 650 mg, Rectal, Q4H PRN, Halford Chessman, Vineet, MD .  albuterol (PROVENTIL) (2.5 MG/3ML) 0.083% nebulizer solution 2.5 mg, 2.5 mg, Nebulization, Q2H PRN, Mauri Brooklyn, MD, 2.5 mg at 03/26/18 0541 .  artificial tears (LACRILUBE)  ophthalmic ointment 1 application, 1 application, Both Eyes, Q8H, Sood, Vineet, MD, 1 application at 35/32/99 0516 .  carvedilol (COREG) tablet 6.25 mg, 6.25 mg, Per Tube, BID WC, Sood, Vineet, MD, 6.25 mg at 03/26/18 1642 .  cefTRIAXone (ROCEPHIN) 1 g in sodium chloride 0.9 % 100 mL IVPB, 1 g, Intravenous, Q24H, Chesley Mires, MD, Stopped at 03/26/18 1038 .  chlorhexidine gluconate (MEDLINE KIT) (PERIDEX) 0.12 % solution 15 mL, 15 mL, Mouth Rinse, BID, Halford Chessman, Vineet, MD, 15 mL at 03/27/18 0734 .  Chlorhexidine Gluconate Cloth 2 % PADS 6 each, 6 each, Topical, Daily, Consuella Lose, MD, 6 each at 03/26/18 1000 .  cisatracurium (NIMBEX) 200 mg in sodium chloride 0.9 % 200 mL (1 mg/mL) infusion, 3-10 mcg/kg/min, Intravenous, Titrated, Sood, Vineet, MD, Last Rate: 18.36 mL/hr at 03/27/18 0400, 3 mcg/kg/min at 03/27/18 0400 .  docusate sodium (COLACE) capsule 100 mg, 100 mg, Oral, BID, Sood, Vineet, MD .  feeding supplement (PRO-STAT SUGAR FREE 64) liquid 60 mL, 60 mL, Per Tube, BID, Chesley Mires, MD, 60 mL at 03/26/18 2146 .  feeding supplement (VITAL HIGH PROTEIN) liquid 1,000 mL, 1,000 mL, Per Tube, Continuous, Sood, Vineet, MD, Last Rate: 25 mL/hr at 03/27/18 0400 .  fentaNYL (SUBLIMAZE) bolus via infusion 50 mcg, 50 mcg, Intravenous, Q30 min PRN, Chesley Mires, MD .  fentaNYL (SUBLIMAZE) injection 100 mcg, 100 mcg, Intravenous, Once PRN, Sood, Vineet, MD .  fentaNYL 2570mg in NS 2558m(1017mml) infusion-PREMIX, 0-400 mcg/hr, Intravenous, Continuous, Sood, Vineet, MD, Last Rate: 4 mL/hr at 03/27/18 0400, 40 mcg/hr at 03/27/18 0400 .  hydrALAZINE (APRESOLINE) injection 10 mg, 10 mg, Intravenous, Q4H PRN, Deterding, EliGuadelupe SabinD .  insulin aspart (novoLOG) injection 0-20 Units, 0-20 Units, Subcutaneous, Q4H, SooChesley MiresD, 7 Units at 03/27/18 0401 .  ipratropium-albuterol (DUONEB) 0.5-2.5 (3) MG/3ML nebulizer solution 3 mL, 3 mL, Nebulization, Q6H, Sood, Vineet, MD, 3 mL at 03/27/18 0752 .   labetalol (NORMODYNE,TRANDATE) injection 10 mg, 10 mg, Intravenous, Q2H PRN, Deterding, EliGuadelupe SabinD .  levETIRAcetam (KEPPRA) 100 MG/ML solution 1,000 mg, 1,000 mg, Per Tube, BID, SooChesley MiresD, 1,000 mg at 03/26/18 2146 .  MEDLINE mouth rinse, 15 mL, Mouth Rinse, 10 times per day, SooChesley MiresD, 15 mL at 03/27/18 0509 .  methylPREDNISolone sodium succinate (SOLU-MEDROL) 40 mg/mL injection 40 mg, 40 mg, Intravenous, Q8H, Sood, Vineet, MD, 40 mg at 03/27/18 0510 .  metoprolol tartrate (LOPRESSOR) injection 2.5-5 mg, 2.5-5 mg, Intravenous, Q3H PRN, Deterding, EliGuadelupe SabinD, 5 mg at 03/26/18 2254 .  midazolam (VERSED) 50 mg in sodium chloride 0.9 % 50 mL (1 mg/mL) infusion, 0-10 mg/hr, Intravenous, Continuous, Sood, Vineet, MD, Last Rate: 1 mL/hr at 03/27/18 0623, 1 mg/hr at 03/27/18 0623 .  midazolam (VERSED) bolus via infusion 1-2 mg, 1-2 mg, Intravenous, Q2H PRN, Sood, Vineet, MD .  nicardipine (CARDENE) 74m62m 0.86% saline 200ml88minfusion (0.1 mg/ml), 5-15 mg/hr, Intravenous, Titrated, Sood, Vineet, MD, Last Rate: 125 mL/hr at 03/27/18 0733, 12.5 mg/hr at 03/27/18 0733 .  niMODipine (NIMOTOP) capsule 60 mg, 60 mg, Oral, Q4H **OR** niMODipine (NYMALIZE) 60 MG/20ML oral solution 60 mg, 60 mg, Per Tube, Q4H, Sood, Vineet, MD, 60 mg at 03/27/18 0735 .  ondansetron (ZOFRAN-ODT) disintegrating tablet 4 mg, 4 mg, Oral, Q6H PRN **OR** ondansetron (ZOFRAN) injection 4 mg, 4 mg, Intravenous, Q6H PRN, Sood,Halford Chessmaneet, MD .  pantoprazole sodium (PROTONIX) 40 mg/20 mL oral suspension 40 mg, 40 mg, Per Tube, Q24H, Sood, Vineet, MD, 40 mg at 03/27/18 0736 .  propofol (DIPRIVAN) 1000 MG/100ML infusion, 25-80 mcg/kg/min, Intravenous, Continuous, Sood, Vineet, MD, Last Rate: 24.5 mL/hr at 03/27/18 0455, 40 mcg/kg/min at 03/27/18 0455 .  sodium chloride flush (NS) 0.9 % injection 10-40 mL, 10-40 mL, Intracatheter, Q12H, NundkConsuella Lose 40 mL at 03/26/18 2206   Physical Exam: Intubated, on vent with  sedation as well a nimbex gtt, neuro exam therefore significantly limited  Assessment & Plan: 62 y.58 woman HH4 with ruptured L PICA s/p coiling, now PBD5. -neuro exam limited due to nimbex gtt, defer vent management to CCM -continue supportive post-aSAH care, nimodipine -husband concerned about treating her breast cancer, discussed with him at bedside, explained that we should concentrate on her recovering from her aSAH and that her cancer care is important, but not the #1 priority at this time  ThomaINDIGO BARBIAN08/19 5:08 PM

## 2018-03-27 NOTE — Progress Notes (Signed)
Spoke with MD in regards to results of patient's xray and having to perform recruitment maneuvers.  Per Dr. Chase Caller, will increase PEEP to 8 then to 10 to help with atelectasis.  Once reach 10 of PEEP will obtain an ABG.  Will wean FIO2 from 70% to 50% as tolerated once on 10 of PEEP.

## 2018-03-27 NOTE — Progress Notes (Signed)
RT called to patient room due to patient having a drop in oxygen saturations to low 80s.  Performed recruitment maneuver with patient and had an improvement in sats to high 90s.  Chest xray was obtained.  Will continue to monitor.

## 2018-03-27 NOTE — Progress Notes (Signed)
RT note: unable to perform daily SBT due to patient being on paralytic.  Will continue to monitor.

## 2018-03-27 NOTE — Progress Notes (Signed)
[facility] Progress Note Patient Name: Diane Short DOB: 16-Mar-1956 MRN: [id number]   Date of Service  03/27/2018  HPI/Events of Note  Hypokalemia  eICU Interventions  Potassium replaced     Intervention Category Minor Interventions: Electrolytes abnormality - evaluation and management  DETERDING,ELIZABETH 03/27/2018, 5:56 AM

## 2018-03-28 ENCOUNTER — Inpatient Hospital Stay (HOSPITAL_COMMUNITY): Payer: Medicaid Other

## 2018-03-28 ENCOUNTER — Inpatient Hospital Stay: Payer: Self-pay

## 2018-03-28 LAB — CBC WITH DIFFERENTIAL/PLATELET
Basophils Absolute: 0.2 10*3/uL — ABNORMAL HIGH (ref 0.0–0.1)
Basophils Relative: 1 %
Eosinophils Absolute: 0 10*3/uL (ref 0.0–0.7)
Eosinophils Relative: 0 %
HEMATOCRIT: 27.9 % — AB (ref 36.0–46.0)
HEMOGLOBIN: 8.3 g/dL — AB (ref 12.0–15.0)
LYMPHS ABS: 1 10*3/uL (ref 0.7–4.0)
Lymphocytes Relative: 6 %
MCH: 30.5 pg (ref 26.0–34.0)
MCHC: 29.7 g/dL — AB (ref 30.0–36.0)
MCV: 102.6 fL — ABNORMAL HIGH (ref 78.0–100.0)
Monocytes Absolute: 1.5 10*3/uL — ABNORMAL HIGH (ref 0.1–1.0)
Monocytes Relative: 9 %
NEUTROS ABS: 14.3 10*3/uL — AB (ref 1.7–7.7)
Neutrophils Relative %: 84 %
Platelets: 85 10*3/uL — ABNORMAL LOW (ref 150–400)
RBC: 2.72 MIL/uL — ABNORMAL LOW (ref 3.87–5.11)
RDW: 19.9 % — ABNORMAL HIGH (ref 11.5–15.5)
WBC: 17 10*3/uL — ABNORMAL HIGH (ref 4.0–10.5)

## 2018-03-28 LAB — HEPATIC FUNCTION PANEL
ALT: 93 U/L — AB (ref 0–44)
AST: 106 U/L — AB (ref 15–41)
Albumin: 2.5 g/dL — ABNORMAL LOW (ref 3.5–5.0)
Alkaline Phosphatase: 100 U/L (ref 38–126)
BILIRUBIN DIRECT: 0.3 mg/dL — AB (ref 0.0–0.2)
BILIRUBIN TOTAL: 0.8 mg/dL (ref 0.3–1.2)
Indirect Bilirubin: 0.5 mg/dL (ref 0.3–0.9)
Total Protein: 5.4 g/dL — ABNORMAL LOW (ref 6.5–8.1)

## 2018-03-28 LAB — POCT I-STAT 3, ART BLOOD GAS (G3+)
ACID-BASE DEFICIT: 1 mmol/L (ref 0.0–2.0)
Bicarbonate: 25.2 mmol/L (ref 20.0–28.0)
O2 Saturation: 95 %
Patient temperature: 104
TCO2: 27 mmol/L (ref 22–32)
pCO2 arterial: 58.1 mmHg — ABNORMAL HIGH (ref 32.0–48.0)
pH, Arterial: 7.26 — ABNORMAL LOW (ref 7.350–7.450)
pO2, Arterial: 104 mmHg (ref 83.0–108.0)

## 2018-03-28 LAB — CK TOTAL AND CKMB (NOT AT ARMC)
CK, MB: 1.4 ng/mL (ref 0.5–5.0)
RELATIVE INDEX: INVALID (ref 0.0–2.5)
Total CK: 75 U/L (ref 38–234)

## 2018-03-28 LAB — BASIC METABOLIC PANEL
Anion gap: 9 (ref 5–15)
BUN: 46 mg/dL — ABNORMAL HIGH (ref 6–20)
CALCIUM: 8.5 mg/dL — AB (ref 8.9–10.3)
CO2: 24 mmol/L (ref 22–32)
CREATININE: 0.88 mg/dL (ref 0.44–1.00)
Chloride: 121 mmol/L — ABNORMAL HIGH (ref 98–111)
GFR calc non Af Amer: >60 mL/min (ref >60)
Glucose, Bld: 209 mg/dL — ABNORMAL HIGH (ref 70–99)
Potassium: 4 mmol/L (ref 3.5–5.1)
SODIUM: 154 mmol/L — AB (ref 135–145)

## 2018-03-28 LAB — GLUCOSE, CAPILLARY
GLUCOSE-CAPILLARY: 189 mg/dL — AB (ref 70–99)
GLUCOSE-CAPILLARY: 135 mg/dL — AB (ref 70–99)
Glucose-Capillary: 194 mg/dL — ABNORMAL HIGH (ref 70–99)
Glucose-Capillary: 233 mg/dL — ABNORMAL HIGH (ref 70–99)
Glucose-Capillary: 157 mg/dL — ABNORMAL HIGH (ref 70–99)

## 2018-03-28 LAB — TRIGLYCERIDES: TRIGLYCERIDES: 427 mg/dL — AB (ref <150)

## 2018-03-28 LAB — MAGNESIUM: Magnesium: 2.3 mg/dL (ref 1.7–2.4)

## 2018-03-28 LAB — PROCALCITONIN: PROCALCITONIN: 0.27 ng/mL

## 2018-03-28 LAB — PHOSPHORUS: Phosphorus: 4.1 mg/dL (ref 2.5–4.6)

## 2018-03-28 LAB — LACTIC ACID, PLASMA: LACTIC ACID, VENOUS: 1.7 mmol/L (ref 0.5–1.9)

## 2018-03-28 MED ORDER — FUROSEMIDE 10 MG/ML IJ SOLN
80.0000 mg | Freq: Four times a day (QID) | INTRAMUSCULAR | Status: DC
  Administered 2018-03-28 – 2018-03-29 (×4): 80 mg via INTRAVENOUS
  Filled 2018-03-28 (×4): qty 8

## 2018-03-28 MED ORDER — DOCUSATE SODIUM 100 MG PO CAPS: 100.0000 mg | ORAL_CAPSULE | Freq: Two times a day (BID) | ORAL | Status: DC | PRN

## 2018-03-28 MED ORDER — CARVEDILOL 12.5 MG PO TABS
25.0000 mg | ORAL_TABLET | Freq: Two times a day (BID) | ORAL | Status: DC
  Administered 2018-03-28 – 2018-04-05 (×13): 25 mg
  Filled 2018-03-28 (×15): qty 2

## 2018-03-28 MED ORDER — DEXTROSE 5 % IV SOLN
INTRAVENOUS | Status: DC
  Administered 2018-03-28: 15:00:00 via INTRAVENOUS

## 2018-03-28 NOTE — Progress Notes (Signed)
Neurosurgery Service Progress Note  Subjective: No acute events overnight. Still on nimbex gtt, requiring increasing vent settings  Objective: Vitals:   03/28/18 0600 03/28/18 0700 03/28/18 0737 03/28/18 0800  BP: (!) 146/56 (!) 154/57 (!) 183/69 (!) 149/55  Pulse: (!) 106 (!) 114 (!) 116 (!) 120  Resp: '20 20 20 20  ' Temp:    100.2 F (37.9 C)  TempSrc:    Axillary  SpO2: 97% 100% 100% 100%  Weight:      Height:       Temp (24hrs), Avg:99.3 F (37.4 C), Min:97.4 F (36.3 C), Max:100.4 F (38 C)  CBC Latest Ref Rng & Units 03/28/2018 03/27/2018 03/26/2018  WBC 4.0 - 10.5 K/uL 17.0(H) 19.5(H) 17.1(H)  Hemoglobin 12.0 - 15.0 g/dL 8.3(L) 8.2(L) 8.4(L)  Hematocrit 36.0 - 46.0 % 27.9(L) 27.4(L) 28.6(L)  Platelets 150 - 400 K/uL 85(L) 58(L) 32(L)   BMP Latest Ref Rng & Units 03/28/2018 03/27/2018 03/26/2018  Glucose 70 - 99 mg/dL 209(H) 236(H) 158(H)  BUN 6 - 20 mg/dL 46(H) 30(H) 19  Creatinine 0.44 - 1.00 mg/dL 0.88 0.73 0.79  Sodium 135 - 145 mmol/L 154(H) 148(H) 147(H)  Potassium 3.5 - 5.1 mmol/L 4.0 3.4(L) 4.0  Chloride 98 - 111 mmol/L 121(H) 117(H) 114(H)  CO2 22 - 32 mmol/L '24 22 25  ' Calcium 8.9 - 10.3 mg/dL 8.5(L) 8.2(L) 8.5(L)    Intake/Output Summary (Last 24 hours) at 03/28/2018 0900 Last data filed at 03/28/2018 0800 Gross per 24 hour  Intake 5075.85 ml  Output 4650 ml  Net 425.85 ml    Current Facility-Administered Medications:  .  0.9 %  sodium chloride infusion, , Intravenous, Continuous, Sood, Vineet, MD, Last Rate: 50 mL/hr at 03/28/18 0800 .  acetaminophen (TYLENOL) tablet 650 mg, 650 mg, Oral, Q4H PRN, 650 mg at 03/25/18 0400 **OR** acetaminophen (TYLENOL) solution 650 mg, 650 mg, Per Tube, Q4H PRN, 650 mg at 03/28/18 0853 **OR** acetaminophen (TYLENOL) suppository 650 mg, 650 mg, Rectal, Q4H PRN, Halford Chessman, Vineet, MD .  albuterol (PROVENTIL) (2.5 MG/3ML) 0.083% nebulizer solution 2.5 mg, 2.5 mg, Nebulization, Q2H PRN, Mauri Brooklyn, MD, 2.5 mg at 03/26/18 0541 .   artificial tears (LACRILUBE) ophthalmic ointment 1 application, 1 application, Both Eyes, Q8H, Sood, Vineet, MD, 1 application at 09/81/19 0518 .  carvedilol (COREG) tablet 6.25 mg, 6.25 mg, Per Tube, BID WC, Sood, Vineet, MD, 6.25 mg at 03/28/18 0853 .  cefTRIAXone (ROCEPHIN) 1 g in sodium chloride 0.9 % 100 mL IVPB, 1 g, Intravenous, Q24H, Chesley Mires, MD, Stopped at 03/27/18 [id number] .  chlorhexidine gluconate (MEDLINE KIT) (PERIDEX) 0.12 % solution 15 mL, 15 mL, Mouth Rinse, BID, Halford Chessman, Vineet, MD, 15 mL at 03/28/18 0853 .  Chlorhexidine Gluconate Cloth 2 % PADS 6 each, 6 each, Topical, Daily, Consuella Lose, MD, 6 each at 03/26/18 1000 .  cisatracurium (NIMBEX) 200 mg in sodium chloride 0.9 % 200 mL (1 mg/mL) infusion, 3-10 mcg/kg/min, Intravenous, Titrated, Sood, Vineet, MD, Last Rate: 18.36 mL/hr at 03/28/18 0800, 3 mcg/kg/min at 03/28/18 0800 .  docusate sodium (COLACE) capsule 100 mg, 100 mg, Oral, BID, Sood, Vineet, MD .  feeding supplement (PRO-STAT SUGAR FREE 64) liquid 60 mL, 60 mL, Per Tube, BID, Chesley Mires, MD, 60 mL at 03/27/18 2150 .  feeding supplement (VITAL HIGH PROTEIN) liquid 1,000 mL, 1,000 mL, Per Tube, Continuous, Sood, Vineet, MD, Last Rate: 25 mL/hr at 03/28/18 0800 .  fentaNYL (SUBLIMAZE) bolus via infusion 50 mcg, 50 mcg, Intravenous, Q30 min PRN, Sood, Vineet,  MD .  fentaNYL (SUBLIMAZE) injection 100 mcg, 100 mcg, Intravenous, Once PRN, Sood, Vineet, MD .  fentaNYL 2561mg in NS 2533m(1052mml) infusion-PREMIX, 0-400 mcg/hr, Intravenous, Continuous, Sood, Vineet, MD, Last Rate: 4 mL/hr at 03/28/18 0600, 40 mcg/hr at 03/28/18 0600 .  furosemide (LASIX) injection 80 mg, 80 mg, Intravenous, Q8H, Ramaswamy, Murali, MD, 80 mg at 03/28/18 0518 .  hydrALAZINE (APRESOLINE) injection 10 mg, 10 mg, Intravenous, Q4H PRN, Deterding, EliGuadelupe SabinD .  insulin aspart (novoLOG) injection 0-20 Units, 0-20 Units, Subcutaneous, Q4H, SooChesley MiresD, 4 Units at 03/28/18 0853 .   ipratropium-albuterol (DUONEB) 0.5-2.5 (3) MG/3ML nebulizer solution 3 mL, 3 mL, Nebulization, Q6H, Sood, Vineet, MD, 3 mL at 03/28/18 0737 .  labetalol (NORMODYNE,TRANDATE) injection 10 mg, 10 mg, Intravenous, Q2H PRN, Deterding, EliGuadelupe SabinD .  levETIRAcetam (KEPPRA) 100 MG/ML solution 1,000 mg, 1,000 mg, Per Tube, BID, SooChesley MiresD, 1,000 mg at 03/27/18 2150 .  MEDLINE mouth rinse, 15 mL, Mouth Rinse, 10 times per day, SooChesley MiresD, 15 mL at 03/28/18 0511 .  methylPREDNISolone sodium succinate (SOLU-MEDROL) 40 mg/mL injection 40 mg, 40 mg, Intravenous, Q8H, Sood, Vineet, MD, 40 mg at 03/28/18 0518 .  metoprolol tartrate (LOPRESSOR) injection 2.5-5 mg, 2.5-5 mg, Intravenous, Q3H PRN, Deterding, EliGuadelupe SabinD, 5 mg at 03/28/18 0852 .  midazolam (VERSED) 50 mg in sodium chloride 0.9 % 50 mL (1 mg/mL) infusion, 0-10 mg/hr, Intravenous, Continuous, Sood, Vineet, MD, Last Rate: 1 mL/hr at 03/28/18 0800, 1 mg/hr at 03/28/18 0800 .  midazolam (VERSED) bolus via infusion 1-2 mg, 1-2 mg, Intravenous, Q2H PRN, SooHalford Chessmanineet, MD .  nicardipine (CARDENE) 100m93m 0.86% saline 200ml49minfusion (0.1 mg/ml), 5-15 mg/hr, Intravenous, Titrated, Sood, Vineet, MD, Last Rate: 150 mL/hr at 03/28/18 0800, 15 mg/hr at 03/28/18 0800 .  niMODipine (NIMOTOP) capsule 60 mg, 60 mg, Oral, Q4H **OR** niMODipine (NYMALIZE) 60 MG/20ML oral solution 60 mg, 60 mg, Per Tube, Q4H, Sood,Chesley Mires 60 mg at 03/28/18 0852 .  ondansetron (ZOFRAN-ODT) disintegrating tablet 4 mg, 4 mg, Oral, Q6H PRN **OR** ondansetron (ZOFRAN) injection 4 mg, 4 mg, Intravenous, Q6H PRN, Sood,Halford Chessmaneet, MD .  pantoprazole sodium (PROTONIX) 40 mg/20 mL oral suspension 40 mg, 40 mg, Per Tube, Q24H, Sood, Vineet, MD, 40 mg at 03/28/18 0854 .  potassium chloride 20 MEQ/15ML (10%) solution 40 mEq, 40 mEq, Per Tube, BID, RamasBrand Males 40 mEq at 03/27/18 2150 .  propofol (DIPRIVAN) 1000 MG/100ML infusion, 25-80 mcg/kg/min, Intravenous,  Continuous, Sood,Chesley Mires Stopped at 03/27/18 1334 .  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 -neuro exam limited due to nimbex gtt, will continue to follow -continue supportive post-aSAH care, nimodipine  ThomaBARBA SOLT08/19 5:08 PM

## 2018-03-28 NOTE — Progress Notes (Signed)
Wasted 124ml of Propofol with Donato Heinz RN in the sink.

## 2018-03-28 NOTE — Progress Notes (Signed)
Peripherally Inserted Central Catheter/Midline Placement  The IV Nurse has discussed with the patient and/or persons authorized to consent for the patient, the purpose of this procedure and the potential benefits and risks involved with this procedure.  The benefits include less needle sticks, lab draws from the catheter, and the patient may be discharged home with the catheter. Risks include, but not limited to, infection, bleeding, blood clot (thrombus formation), and puncture of an artery; nerve damage and irregular heartbeat and possibility to perform a PICC exchange if needed/ordered by physician.  Alternatives to this procedure were also discussed.  Bard Power PICC patient education guide, fact sheet on infection prevention and patient information card has been provided to patient /or left at bedside.    PICC/Midline Placement Documentation  PICC Triple Lumen 03/28/18 PICC Right Cephalic 39 cm 0 cm (Active)  Indication for Insertion or Continuance of Line Vasoactive infusions 03/28/2018 12:54 PM  Exposed Catheter (cm) 0 cm 03/28/2018 12:54 PM  Site Assessment Clean;Dry;Intact 03/28/2018 12:54 PM  Lumen #1 Status Flushed;Blood return noted 03/28/2018 12:54 PM  Lumen #2 Status Flushed;Blood return noted 03/28/2018 12:54 PM  Lumen #3 Status Flushed;Blood return noted 03/28/2018 12:54 PM  Dressing Type Transparent 03/28/2018 12:54 PM  Dressing Status Clean;Dry;Intact;Antimicrobial disc in place 03/28/2018 12:54 PM  Dressing Intervention New dressing 03/28/2018 12:54 PM  Dressing Change Due 04/04/18 03/28/2018 12:54 PM    Family signed consent at bedside   Synthia Innocent 03/28/2018, 12:55 PM

## 2018-03-28 NOTE — Progress Notes (Signed)
PULMONARY / CRITICAL CARE MEDICINE   NAME:  Diane Short, MRN:  [id number], DOB:  1956-06-13, LOS: 6 ADMISSION DATE:  03/22/2018, CONSULTATION DATE: 03/22/2018 REFERRING MD:  Dr. Leonette Monarch, ER, CHIEF COMPLAINT:  Headache  BRIEF HISTORY:    62 yo female smoker with hx of cerebral aneurysms s/p intervention presented with persistent headache, and altered mental status.  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  Intubated for airway protection.  Nimodipine started.  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  SIGNIFICANT PAST MEDICAL HISTORY   Stage 4 Breast cancer, Neuropathy, HTN, Hiatal hernia, GERD, Depression  SIGNIFICANT EVENTS:  9/09 Admit 9/10 cerebral angiogram with coiling 9/11 extubated >> reintubated due to stridor and start steroids; start ABx for UTI, aspiration 9/13 wheezing, respiratory acidosis >> paralytic x 1, deep sedation. STarted on nimbex and solumedrol 9/14-  on nimbex since yesterday due to vent dynschorny. On fent, versed and diprivan. BIS 40-60. Pulse ox 89%, 45% fio2, peep - 5, On Cardene  For goal sbp < 160. Currently 135. On tube feeds. Lot of 3rd spacing.  +. Rt hand bruising - worse per RN but pulse rt radial, rt ulnar and cap refill on right hand. Started lasix   STUDIES:   CT head 9/09 >> SAH, mild hydrocephalus EEG 9/09 >> slowing CT head 9/10 >> stable SAH and IVH Echo 9/10 >> left ventricular EF 55 to 60%.  Grade 1 diastolic dysfunction.  CULTURES:  Urine 9/09 >> E coli Sputum 9/09 >> oral flora Blood 9/09 >>   ANTIBIOTICS:  Zosyn 9/11 >> 9/13 Rocephin 9/13 >>   LINES/TUBES:  Rt port >> ETT 9/09 >> 9/11 ETT 9/11 >> Rt radial aline 9/13 >>   CONSULTANTS:  Neurosurgery 9/09 >>  SUBJECTIVE:  03/28/2018 - RN suspect BIS is not correctly predicting sedation at < 60 . So fent increased. Currently on fent 261mg, Versed 261mgtt, Nimbex 45m47m-> BIS 35. Peep 10 and fio2 60% - incraesd - > pulse ox 90% . CXR with  effusion/atlectasis. Ongoing lasix + 9.7 L since admit. Rt hand better per RN. Low grade  Fever continues - on rocephin. On Cardene    CONSTITUTIONAL: BP (!) 149/55 (BP Location: Left Leg)   Pulse (!) 120   Temp 100.2 F (37.9 C) (Axillary)   Resp 20   Ht '5\' 4"'  (1.626 m)   Wt 102 kg   LMP 08/14/2010 (Exact Date)   SpO2 100%   BMI 38.60 kg/m   I/O last 3 completed shifts: In: 8835.2 [I.V.:7485.2; NG/GT:1250; IV Piggyback:100] Out: 5925 [Urine:4775; Stool:1150]     Vent Mode: PRVC FiO2 (%):  [50 %-70 %] 60 % Set Rate:  [20 bmp] 20 bmp Vt Set:  [440 mL] 440 mL PEEP:  [5 cmH20-10 cmH20] 10 cmH20 Plateau Pressure:  [22 cmH20-25 cmH20] 25 cmH20  PHYSICAL EXAM: General Appearance:  Looks criticall ill OBESE - YES. VOLUME OVERLOADED + Head:  Normocephalic, without obvious abnormality, atraumatic Eyes:  PERRL - yes, conjunctiva/corneas - clear     Ears:  Normal external ear canals, both ears Nose:  G tube - no Throat:  ETT TUBE - yes , OG tube - yes and on TF Neck:  Supple,  No enlargement/tenderness/nodules Lungs: Clear to auscultation bilaterally, Ventilator   Synchrony - yes Heart:  S1 and S2 normal, no murmur, CVP - no.  Pressors - no. On Cardene Abdomen:  Soft, no masses, no organomegaly Genitalia /  Rectal:  Not done Extremities:  Extremities- intact. EDema +++ - some bette. Rt hand bruise better Skin:  ntact in exposed areas . Sacral area - no decub3 Neurologic:  Sedation - fent gtt, versed gtt, nimbex gtt -> RASS - -5./ BIS 39 . Moves all 4s - no. CAM-ICU - cannot assess . Orientation - cannot assess          LABS    PULMONARY Recent Labs  Lab 03/24/18 0332 03/24/18 1212  03/26/18 0631 03/26/18 0743 03/27/18 0313 03/27/18 1822 03/28/18 0315  PHART 7.313* 7.244*   < > 7.154* 7.248* 7.233* 7.290* 7.260*  PCO2ART 41.4 57.4*   < > 71.7* 54.1* 58.1* 51.3* 58.1*  PO2ART 106.0 199.0*   < > 158.0* 100.0 63.1* 81.8* 104.0  HCO3 20.8 24.7   < > 25.3 23.6 23.7  24.0 25.2  TCO2 22 26  --  28 25  --   --  27  O2SAT 97.0 100.0   < > 99.0 96.0 88.6 95.6 95.0   < > = values in this interval not displayed.    CBC Recent Labs  Lab 03/26/18 0550 03/27/18 0519 03/28/18 0433  HGB 8.4* 8.2* 8.3*  HCT 28.6* 27.4* 27.9*  WBC 17.1* 19.5* 17.0*  PLT 32* 58* 85*    COAGULATION Recent Labs  Lab 03/22/18 0924  INR 1.04    CARDIAC  No results for input(s): TROPONINI in the last 168 hours. No results for input(s): PROBNP in the last 168 hours.   CHEMISTRY Recent Labs  Lab 03/23/18 1810 03/24/18 0542 03/24/18 1714 03/25/18 0555 03/26/18 0550 03/27/18 0519 03/28/18 0229 03/28/18 0433  NA  --  141  --  143 147* 148* 154*  --   K  --  3.8  --  3.4* 4.0 3.4* 4.0  --   CL  --  111  --  114* 114* 117* 121*  --   CO2  --  19*  --  19* '25 22 24  ' --   GLUCOSE  --  162*  --  210* 158* 236* 209*  --   BUN  --  8  --  10 19 30* 46*  --   CREATININE  --  0.63  --  0.81 0.79 0.73 0.88  --   CALCIUM  --  7.8*  --  7.9* 8.5* 8.2* 8.5*  --   MG 1.9 2.0 1.9  --  2.4  --   --  2.3  PHOS 2.8 2.3* 2.6  --  5.3*  --   --  4.1   Estimated Creatinine Clearance: 79 mL/min (by C-G formula based on SCr of 0.88 mg/dL).   LIVER Recent Labs  Lab 03/22/18 0539 03/22/18 0924 03/28/18 0433  AST 33  --  106*  ALT 35  --  93*  ALKPHOS 76  --  100  BILITOT 0.5  --  0.8  PROT 5.8*  --  5.4*  ALBUMIN 3.2*  --  2.5*  INR  --  1.04  --      INFECTIOUS Recent Labs  Lab 03/28/18 0229  LATICACIDVEN 1.7     ENDOCRINE CBG (last 3)  Recent Labs    03/27/18 2327 03/28/18 0354 03/28/18 0732  GLUCAP 205* 194* 189*         IMAGING x48h  - image(s) personally visualized  -   highlighted in bold Dg Chest Port 1 View  Result Date: 03/28/2018 CLINICAL DATA:  Endotracheal tube EXAM: PORTABLE CHEST 1  VIEW COMPARISON:  03/27/2018 FINDINGS: Endotracheal tube terminates 4 cm above the carina. Cardiomegaly with pulmonary vascular congestion. No frank  interstitial edema. Right chest port terminates in the mid SVC. Small bilateral pleural effusions. Associated lower lobe opacities, likely atelectasis. IMPRESSION: Endotracheal tube terminates 4 cm above the carina. Cardiomegaly with pulmonary vascular congestion. No frank interstitial edema. Small bilateral pleural effusions. Associated lower lobe opacities, likely atelectasis. Electronically Signed   By: Julian Hy M.D.   On: 03/28/2018 07:23   Dg Chest Port 1 View  Result Date: 03/27/2018 CLINICAL DATA:  Decreased oxygen saturation EXAM: PORTABLE CHEST 1 VIEW COMPARISON:  Chest radiograph 03/27/2018 FINDINGS: ET tube terminates in the mid trachea. Right anterior chest wall Port-A-Cath tip projects over the superior vena cava. Enteric tube courses inferior to the diaphragm. Multiple monitoring leads overlie the patient. Low lung volumes. Stable cardiomegaly. Bibasilar heterogeneous opacities. IMPRESSION: Low lung volumes with basilar opacities which may represent atelectasis or infection. ET tube mid trachea. Electronically Signed   By: Lovey Newcomer M.D.   On: 03/27/2018 16:12   Dg Chest Port 1 View  Result Date: 03/27/2018 CLINICAL DATA:  Respiratory failure EXAM: PORTABLE CHEST 1 VIEW COMPARISON:  Yesterday FINDINGS: Endotracheal tube tip at the clavicular heads. An orogastric tube tip reaches the stomach. Porta catheter on the right with high loop in the neck. Tip is at the distal SVC. Low volumes. There is no edema, consolidation, effusion, or pneumothorax. Normal heart size and mediastinal contours. IMPRESSION: 1. Stable hardware positioning. 2. Stable low volume chest. Electronically Signed   By: Monte Fantasia M.D.   On: 03/27/2018 08:47     Recent Results (from the past 240 hour(s))  MRSA PCR Screening     Status: None   Collection Time: 03/22/18  9:29 AM  Result Value Ref Range Status   MRSA by PCR NEGATIVE NEGATIVE Final    Comment:        The GeneXpert MRSA Assay (FDA approved  for NASAL specimens only), is one component of a comprehensive MRSA colonization surveillance program. It is not intended to diagnose MRSA infection nor to guide or monitor treatment for MRSA infections. Performed at [facility], [place] [address] [place], [place] [address]   Culture, respiratory (non-expectorated)     Status: None   Collection Time: 03/22/18  3:25 PM  Result Value Ref Range Status   Specimen Description ENDOTRACHEAL  Final   Special Requests NONE  Final   Gram Stain   Final    RARE WBC PRESENT, PREDOMINANTLY PMN FEW GRAM POSITIVE COCCI RARE GRAM POSITIVE RODS    Culture   Final    Consistent with normal respiratory flora. Performed at [facility], [place] [address], [place], [place] [address]    Report Status 03/24/2018 FINAL  Final  Culture, blood (Routine X 2) w Reflex to ID Panel     Status: None   Collection Time: 03/22/18  4:19 PM  Result Value Ref Range Status   Specimen Description BLOOD RIGHT FOOT  Final   Special Requests   Final    BOTTLES DRAWN AEROBIC ONLY Blood Culture results may not be optimal due to an inadequate volume of blood received in culture bottles   Culture   Final    NO GROWTH 5 DAYS Performed at [facility], [place] [address] [place], [place] [address]    Report Status 03/27/2018 FINAL  Final  Culture, blood (Routine X 2) w Reflex to ID Panel  Status: None   Collection Time: 03/22/18  4:24 PM  Result Value Ref Range Status   Specimen Description BLOOD LEFT FOOT  Final   Special Requests   Final    BOTTLES DRAWN AEROBIC ONLY Blood Culture adequate volume   Culture   Final    NO GROWTH 5 DAYS Performed at [facility], [address], [place], [place] [address]    Report Status 03/27/2018 FINAL  Final  Culture, Urine     Status: Abnormal   Collection Time: 03/22/18  4:33 PM  Result Value Ref Range Status   Specimen Description URINE, RANDOM  Final   Special Requests   Final    NONE Performed at  [facility], [place] [address], [place], [place] [address]    Culture >=100,000 COLONIES/mL ESCHERICHIA COLI (A)  Final   Report Status 03/24/2018 FINAL  Final   Organism ID, Bacteria ESCHERICHIA COLI (A)  Final      Susceptibility   Escherichia coli - MIC*    AMPICILLIN <=2 SENSITIVE Sensitive     CEFAZOLIN <=4 SENSITIVE Sensitive     CEFTRIAXONE <=1 SENSITIVE Sensitive     CIPROFLOXACIN <=0.25 SENSITIVE Sensitive     GENTAMICIN <=1 SENSITIVE Sensitive     IMIPENEM <=0.25 SENSITIVE Sensitive     NITROFURANTOIN <=16 SENSITIVE Sensitive     TRIMETH/SULFA <=20 SENSITIVE Sensitive     AMPICILLIN/SULBACTAM <=2 SENSITIVE Sensitive     PIP/TAZO <=4 SENSITIVE Sensitive     Extended ESBL NEGATIVE Sensitive     * >=100,000 COLONIES/mL ESCHERICHIA COLI     ASSESSMENT AND PLAN   History of tobacco abuse Acute respiratory failure with hypoxia, hypercapnia Postextubation stridor 9/11.-  with acute bronchospasm 9/13 and reintubated. Needing deep sedatin and nimbex and soliumedrol since 03/26/18   03/28/2018 - > responding somewhat to lasix. Acute resp failure - does NOT meet SBT/extubation criteria. Almist bhaving like ARDS but cXrt more c/w Atelectasis/effusion. 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 - continue nimotop - keppra for seizure prophylaxis - goal SBP < 160 - continuje cardene - increase  Coreg to home dose    Acute metabolic encephalopathy. - currently sedated and paralyzed  Volume overload   - starte diuresis aggressive 03/27/18 - will increase diuresis 03/28/18   Aspiration pneumonia. E coli UTI. - day 5 of Abx;  Rocephin - still febrile; check PCT   Hx of HTN. - coreg - increase dose 03/28/18 - prn hydralazine  Stage 4 breast cancer s/p mastectomy, XRT to brain met, and recently on chemo. Chemo induced anemia,  thrombocytopenia. - followed by oncology in [place] - f/u CBC  Hyperglycemia. - SSI  SUMMARY OF TODAY'S PLAN:    Will need to determine long term options for breast cancer given her likely prolonged recovery from [facility]   Till then  = aim to diurese, lift nimbex off 03/29/18 and reassess. Mnitor R hand bruise  Best Practice / Goals of Care / Disposition.   DVT PROPHYLAXIS: SCDs SUP: protonix NUTRITION: tube feeds MOBILITY: bed rest GOALS OF CARE: full code FAMILY DISCUSSIONS:  Updated family at bedside 9/13. Husband updated 03/27/18, Husband 03/28/18    ATTESTATION & SIGNATURE   The patient is critically ill with multiple organ systems failure and requires high complexity decision making for assessment and support, frequent evaluation  and titration of therapies, application of advanced monitoring technologies and extensive interpretation of multiple databases.   Critical Care Time devoted to patient care services described in this note is  30  Minutes. This time reflects time of care of this signee Dr Brand Males. This critical care time does not reflect procedure time, or teaching time or supervisory time of PA/NP/Med student/Med Resident etc but could involve care discussion time     Dr. Brand Males, M.D., [facility].C.P Pulmonary and Critical Care Medicine Staff Physician [place] [facility] Pager: [phone], If no answer or between  15:00h - 7:00h: call [phone]  03/28/2018 11:10 AM

## 2018-03-29 ENCOUNTER — Ambulatory Visit (HOSPITAL_COMMUNITY): Payer: Medicaid Other

## 2018-03-29 ENCOUNTER — Inpatient Hospital Stay (HOSPITAL_COMMUNITY): Payer: Medicaid Other

## 2018-03-29 DIAGNOSIS — I609 Nontraumatic subarachnoid hemorrhage, unspecified: Secondary | ICD-10-CM

## 2018-03-29 DIAGNOSIS — E8770 Fluid overload, unspecified: Secondary | ICD-10-CM

## 2018-03-29 DIAGNOSIS — I10 Essential (primary) hypertension: Secondary | ICD-10-CM

## 2018-03-29 LAB — GLUCOSE, CAPILLARY
GLUCOSE-CAPILLARY: 137 mg/dL — AB (ref 70–99)
GLUCOSE-CAPILLARY: 131 mg/dL — AB (ref 70–99)
GLUCOSE-CAPILLARY: 130 mg/dL — AB (ref 70–99)
GLUCOSE-CAPILLARY: 147 mg/dL — AB (ref 70–99)
Glucose-Capillary: 125 mg/dL — ABNORMAL HIGH (ref 70–99)
Glucose-Capillary: 136 mg/dL — ABNORMAL HIGH (ref 70–99)
Glucose-Capillary: 147 mg/dL — ABNORMAL HIGH (ref 70–99)

## 2018-03-29 LAB — CBC WITH DIFFERENTIAL/PLATELET
Abs Immature Granulocytes: 0.4 10*3/uL — ABNORMAL HIGH (ref 0.0–0.1)
BASOS ABS: 0.1 10*3/uL (ref 0.0–0.1)
BASOS PCT: 0 %
EOS ABS: 0 10*3/uL (ref 0.0–0.7)
Eosinophils Relative: 0 %
HCT: 26.4 % — ABNORMAL LOW (ref 36.0–46.0)
Hemoglobin: 7.9 g/dL — ABNORMAL LOW (ref 12.0–15.0)
IMMATURE GRANULOCYTES: 3 %
LYMPHS ABS: 1.3 10*3/uL (ref 0.7–4.0)
Lymphocytes Relative: 11 %
MCH: 30.3 pg (ref 26.0–34.0)
MCHC: 29.9 g/dL — ABNORMAL LOW (ref 30.0–36.0)
MCV: 101.1 fL — ABNORMAL HIGH (ref 78.0–100.0)
Monocytes Absolute: 2.1 10*3/uL — ABNORMAL HIGH (ref 0.1–1.0)
Monocytes Relative: 16 %
NEUTROS PCT: 70 %
Neutro Abs: 8.8 10*3/uL — ABNORMAL HIGH (ref 1.7–7.7)
Platelets: 106 10*3/uL — ABNORMAL LOW (ref 150–400)
RBC: 2.61 MIL/uL — AB (ref 3.87–5.11)
RDW: 19.8 % — ABNORMAL HIGH (ref 11.5–15.5)
WBC: 12.7 10*3/uL — ABNORMAL HIGH (ref 4.0–10.5)

## 2018-03-29 LAB — BASIC METABOLIC PANEL
ANION GAP: 6 (ref 5–15)
BUN: 60 mg/dL — AB (ref 6–20)
CALCIUM: 8.4 mg/dL — AB (ref 8.9–10.3)
CO2: 27 mmol/L (ref 22–32)
Chloride: 124 mmol/L — ABNORMAL HIGH (ref 98–111)
Creatinine, Ser: 0.87 mg/dL (ref 0.44–1.00)
GFR calc Af Amer: >60 mL/min (ref >60)
GFR calc non Af Amer: >60 mL/min (ref >60)
GLUCOSE: 156 mg/dL — AB (ref 70–99)
Potassium: 3.8 mmol/L (ref 3.5–5.1)
Sodium: 157 mmol/L — ABNORMAL HIGH (ref 135–145)

## 2018-03-29 LAB — POCT I-STAT 3, ART BLOOD GAS (G3+)
ACID-BASE EXCESS: 4 mmol/L — AB (ref 0.0–2.0)
Bicarbonate: 29 mmol/L — ABNORMAL HIGH (ref 20.0–28.0)
O2 SAT: 94 %
PCO2 ART: 42.7 mmHg (ref 32.0–48.0)
PO2 ART: 69 mmHg — AB (ref 83.0–108.0)
Patient temperature: 98.7
TCO2: 30 mmol/L (ref 22–32)
pH, Arterial: 7.44 (ref 7.350–7.450)

## 2018-03-29 LAB — BLOOD GAS, ARTERIAL
ACID-BASE DEFICIT: 2.8 mmol/L — AB (ref 0.0–2.0)
Bicarbonate: 23.7 mmol/L (ref 20.0–28.0)
FIO2: 45
MECHVT: 440 mL
O2 SAT: 88.6 %
PATIENT TEMPERATURE: 98.5
PEEP: 5 cmH2O
PH ART: 7.233 — AB (ref 7.350–7.450)
pCO2 arterial: 58.1 mmHg — ABNORMAL HIGH (ref 32.0–48.0)
pO2, Arterial: 63.1 mmHg — ABNORMAL LOW (ref 83.0–108.0)

## 2018-03-29 LAB — PROCALCITONIN: Procalcitonin: 0.39 ng/mL

## 2018-03-29 LAB — MAGNESIUM: Magnesium: 2.3 mg/dL (ref 1.7–2.4)

## 2018-03-29 LAB — PHOSPHORUS: Phosphorus: 4.4 mg/dL (ref 2.5–4.6)

## 2018-03-29 MED ORDER — VITAL HIGH PROTEIN PO LIQD
1000.0000 mL | ORAL | Status: DC
  Administered 2018-03-29 – 2018-04-02 (×5): 1000 mL
  Filled 2018-03-29 (×3): qty 1000

## 2018-03-29 MED ORDER — CHLORHEXIDINE GLUCONATE 0.12 % MT SOLN
OROMUCOSAL | Status: AC
  Administered 2018-03-29: 08:00:00
  Filled 2018-03-29: qty 15

## 2018-03-29 MED ORDER — FUROSEMIDE 10 MG/ML IJ SOLN
10.0000 mg/h | INTRAVENOUS | Status: DC
  Administered 2018-03-29: 10 mg/h via INTRAVENOUS
  Filled 2018-03-29 (×2): qty 25

## 2018-03-29 MED ORDER — FREE WATER
300.0000 mL | Status: DC
  Administered 2018-03-29 – 2018-04-03 (×30): 300 mL

## 2018-03-29 MED ORDER — HEPARIN SOD (PORK) LOCK FLUSH 100 UNIT/ML IV SOLN
500.0000 [IU] | INTRAVENOUS | Status: AC | PRN
  Administered 2018-03-29: 500 [IU]

## 2018-03-29 MED ORDER — NICARDIPINE HCL IN NACL 40-0.83 MG/200ML-% IV SOLN
5.0000 mg/h | INTRAVENOUS | Status: DC
  Administered 2018-03-29: 12.5 mg/h via INTRAVENOUS
  Administered 2018-03-29: 15 mg/h via INTRAVENOUS
  Administered 2018-03-29: 7.5 mg/h via INTRAVENOUS
  Administered 2018-03-29: 10 mg/h via INTRAVENOUS
  Administered 2018-03-29 – 2018-03-30 (×2): 12.5 mg/h via INTRAVENOUS
  Administered 2018-03-30 (×3): 15 mg/h via INTRAVENOUS
  Administered 2018-03-30: 12.5 mg/h via INTRAVENOUS
  Administered 2018-03-30 – 2018-03-31 (×4): 15 mg/h via INTRAVENOUS
  Administered 2018-03-31: 10 mg/h via INTRAVENOUS
  Administered 2018-03-31: 12.5 mg/h via INTRAVENOUS
  Administered 2018-03-31: 15 mg/h via INTRAVENOUS
  Filled 2018-03-29 (×17): qty 200

## 2018-03-29 MED ORDER — POTASSIUM CHLORIDE 20 MEQ/15ML (10%) PO SOLN
40.0000 meq | Freq: Three times a day (TID) | ORAL | Status: AC
  Administered 2018-03-29 (×2): 40 meq
  Filled 2018-03-29 (×2): qty 30

## 2018-03-29 MED ORDER — METOLAZONE 2.5 MG PO TABS
5.0000 mg | ORAL_TABLET | Freq: Every day | ORAL | Status: AC
  Administered 2018-03-29: 5 mg via ORAL
  Filled 2018-03-29 (×2): qty 1
  Filled 2018-03-29: qty 2
  Filled 2018-03-29 (×2): qty 1

## 2018-03-29 MED ORDER — PRO-STAT SUGAR FREE PO LIQD
30.0000 mL | Freq: Every day | ORAL | Status: DC
  Administered 2018-03-30 – 2018-04-03 (×5): 30 mL
  Filled 2018-03-29 (×5): qty 30

## 2018-03-29 NOTE — Progress Notes (Signed)
ABG obtained per MD order.  Obtained on ventilator settings of tidal volume of 440, respiratory rate of 20, PEEP of 10, and FIO2 40%.  Results given to MD and no further instructions at this time.    Ref. Range 03/29/2018 09:27  Sample type Unknown ARTERIAL  pH, Arterial Latest Ref Range: 7.350 - 7.450  7.440  pCO2 arterial Latest Ref Range: 32.0 - 48.0 mmHg 42.7  pO2, Arterial Latest Ref Range: 83.0 - 108.0 mmHg 69.0 (L)  TCO2 Latest Ref Range: 22 - 32 mmol/L 30  Acid-Base Excess Latest Ref Range: 0.0 - 2.0 mmol/L 4.0 (H)  Bicarbonate Latest Ref Range: 20.0 - 28.0 mmol/L 29.0 (H)  O2 Saturation Latest Units: % 94.0  Patient temperature Unknown 98.7 F  Collection site Unknown ARTERIAL LINE

## 2018-03-29 NOTE — Progress Notes (Signed)
  NEUROSURGERY PROGRESS NOTE   Pt seen and examined. No issues overnight.   EXAM: Temp:  [98.3 F (36.8 C)-100.8 F (38.2 C)] 99.1 F (37.3 C) (09/16 0356) Pulse Rate:  [73-117] 79 (09/16 0727) Resp:  [20] 20 (09/16 0727) BP: (122-175)/(44-78) 145/52 (09/16 0727) SpO2:  [90 %-100 %] 100 % (09/16 0727) Arterial Line BP: (126-176)/(48-69) 144/52 (09/16 0700) FiO2 (%):  [40 %-60 %] 40 % (09/16 0727) Intake/Output      09/15 0701 - 09/16 0700 09/16 0701 - 09/17 0700   I.V. (mL/kg) 3945.2 (38.7)    NG/GT 600    IV Piggyback 100    Total Intake(mL/kg) 4645.2 (45.5)    Urine (mL/kg/hr) 6975 (2.8)    Stool 300    Total Output 7275    Net -2629.8          Remains on versed, fentanyl, cisatracurium gtt  LABS: Lab Results  Component Value Date   CREATININE 0.87 03/28/2018   BUN 60 (H) 03/28/2018   NA 157 (H) 03/28/2018   K 3.8 03/28/2018   CL 124 (H) 03/28/2018   CO2 27 03/28/2018   Lab Results  Component Value Date   WBC 12.7 (H) 03/29/2018   HGB 7.9 (L) 03/29/2018   HCT 26.4 (L) 03/29/2018   MCV 101.1 (H) 03/29/2018   PLT 106 (L) 03/29/2018    IMPRESSION: - 62 y.o. female [place] d# 6 s/p coiling recanalized left PICA. Requiring sedation/paralytics for vent synchrony  PLAN: - Attempt to wean sedation/paralytic today - Cont supportive care - Cont Nimotop - TCD today

## 2018-03-29 NOTE — Progress Notes (Signed)
Nutrition Follow-up  DOCUMENTATION CODES:   Obesity unspecified  INTERVENTION:  Increase Vital High Protein to new goal rate of 50 ml/hr (1200 ml/day) 30 ml Prostat once daily  Provides 1300 kcal, 120 grams of protein, 1008 ml water.  NUTRITION DIAGNOSIS:   Inadequate oral intake related to inability to eat as evidenced by NPO status; ongoing  GOAL:   Provide needs based on ASPEN/SCCM guidelines; met  MONITOR:   Vent status, TF tolerance, Labs, I & O's  REASON FOR ASSESSMENT:   Consult Enteral/tube feeding initiation and management  ASSESSMENT:   62 yo female with PMH of HTN, GERD, hiatal hernia, breast cancer with brain mets (receiving chemo & XRT), and osteonecrosis of R jaw bone who was admitted on 9/9 with SAH.  Patient is currently intubated on ventilator support MV: 9.5 L/min Temp (24hrs), Avg:99 F (37.2 C), Min:98.3 F (36.8 C), Max:99.7 F (37.6 C)  Propofol: none  RD to modify tube feeding to better meet nutrition needs now that pt is off propofol. Per MD, plans to wean sedation/paralytic today. Labs and medications reviewed.   Diet Order:   Diet Order            Diet NPO time specified  Diet effective now              EDUCATION NEEDS:   No education needs have been identified at this time  Skin:  Skin Assessment: Reviewed RN Assessment  Last BM:  9/16  Height:   Ht Readings from Last 1 Encounters:  03/24/18 '5\' 4"'  (1.626 m)    Weight:   Wt Readings from Last 1 Encounters:  03/23/18 102 kg  Admit weight (9/9): 90.7 kg Net I/O's: + 5.9 L  Ideal Body Weight:  54.5 kg  BMI:  Body mass index is 38.6 kg/m.  Estimated Nutritional Needs:   Kcal:  1000-1270  Protein:  109-120 grams  Fluid:  >/= 1.5 L    Corrin Parker, MS, RD, LDN Pager # [phone] After hours/ weekend pager # [phone]

## 2018-03-29 NOTE — Progress Notes (Signed)
PULMONARY / CRITICAL CARE MEDICINE   NAME:  Diane Short, MRN:  [id number], DOB:  1955-08-12, LOS: 7 ADMISSION DATE:  03/22/2018, CONSULTATION DATE: 03/22/2018 REFERRING MD:  Dr. Leonette Monarch, ER, CHIEF COMPLAINT:  Headache  BRIEF HISTORY:    62 yo female smoker with hx of cerebral aneurysms s/p intervention presented with persistent headache, and altered mental status.  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  Intubated for airway protection.  Nimodipine started.  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  SIGNIFICANT PAST MEDICAL HISTORY   Stage 4 Breast cancer, Neuropathy, HTN, Hiatal hernia, GERD, Depression  SIGNIFICANT EVENTS:  9/09 Admit 9/10 cerebral angiogram with coiling 9/11 extubated >> reintubated due to stridor and start steroids; start ABx for UTI, aspiration 9/13 wheezing, respiratory acidosis >> paralytic x 1, deep sedation. STarted on nimbex and solumedrol 9/14-  on nimbex since yesterday due to vent dynschorny. On fent, versed and diprivan. BIS 40-60. Pulse ox 89%, 45% fio2, peep - 5, On Cardene  For goal sbp < 160. Currently 135. On tube feeds. Lot of 3rd spacing.  +. Rt hand bruising - worse per RN but pulse rt radial, rt ulnar and cap refill on right hand. Started lasix 9/16 remains paralyzed, diuresed, attempt to get paralytics off today  STUDIES:   CT head 9/09 >> SAH, mild hydrocephalus EEG 9/09 >> slowing CT head 9/10 >> stable SAH and IVH Echo 9/10 >> left ventricular EF 55 to 60%.  Grade 1 diastolic dysfunction.  CULTURES:  Urine 9/09 >> E coli Sputum 9/09 >> oral flora Blood 9/09 >>   ANTIBIOTICS:  Zosyn 9/11 >> 9/13 Rocephin 9/13 >>   LINES/TUBES:  Rt port >> ETT 9/09 >> 9/11 ETT 9/11 >> Rt radial aline 9/13 >>   CONSULTANTS:  Neurosurgery 9/09 >>  SUBJECTIVE:  03/29/2018 - Stable vitals and vent synchrony on nimbex, no acute events overnight  CONSTITUTIONAL: BP (!) 145/52   Pulse 79   Temp 99.1 F (37.3 C)  (Axillary)   Resp 20   Ht _0  (1.626 m)   Wt 102 kg   LMP 08/14/2010 (Exact Date)   SpO2 100%   BMI 38.60 kg/m   I/O last 3 completed shifts: In: 7220.5 [I.V.:6220.5; NG/GT:900; IV Piggyback:100] Out: 9825 [Urine:9300; Stool:525]    Vent Mode: PRVC FiO2 (%):  [40 %-60 %] 40 % Set Rate:  [20 bmp] 20 bmp Vt Set:  [440 mL] 440 mL PEEP:  [5 cmH20-10 cmH20] 10 cmH20 Pressure Support:  [5 cmH20] 5 cmH20 Plateau Pressure:  [23 cmH20-25 cmH20] 23 cmH20  PHYSICAL EXAM: General Appearance:  Critically ill appearing female, sedated and paralyzed Head:  [place]/AT Eyes:  PERRL, EOM-I Ears:  Normal external ear canals, both ears Nose:  OGT in place Throat:  ETT, unable to assess Neck:  Supple, -JVD Lungs: Coarse BS diffusely Heart:  RRR, Nl S1/S2 and -M/R/G on cardene drip for BP control Abdomen:  Soft, NT, ND and +BS Genitalia / Rectal:  Not done Extremities:  Upper ext edema, flank edema Skin:  Intact Neurologic:  Sedated and paralyzed, unable to assess  LABS   PULMONARY Recent Labs  Lab 03/24/18 0332 03/24/18 1212  03/26/18 0631 03/26/18 0743 03/27/18 0313 03/27/18 1822 03/28/18 0315  PHART 7.313* 7.244*   < > 7.154* 7.248* 7.233* 7.290* 7.260*  PCO2ART 41.4 57.4*   < > 71.7* 54.1* 58.1* 51.3* 58.1*  PO2ART 106.0 199.0*   < > 158.0* 100.0  63.1* 81.8* 104.0  HCO3 20.8 24.7   < > 25.3 23.6 23.7 24.0 25.2  TCO2 22 26  --  28 25  --   --  27  O2SAT 97.0 100.0   < > 99.0 96.0 88.6 95.6 95.0   < > = values in this interval not displayed.   CBC Recent Labs  Lab 03/27/18 0519 03/28/18 0433 03/29/18 0443  HGB 8.2* 8.3* 7.9*  HCT 27.4* 27.9* 26.4*  WBC 19.5* 17.0* 12.7*  PLT 58* 85* 106*   COAGULATION Recent Labs  Lab 03/22/18 0924  INR 1.04   CARDIAC  No results for input(s): TROPONINI in the last 168 hours. No results for input(s): PROBNP in the last 168 hours.  CHEMISTRY Recent Labs  Lab 03/24/18 0542 03/24/18 1714 03/25/18 0555 03/26/18 0550  03/27/18 0519 03/28/18 0229 03/28/18 0433 03/28/18 2350 03/29/18 0443  NA 141  --  143 147* 148* 154*  --  157*  --   K 3.8  --  3.4* 4.0 3.4* 4.0  --  3.8  --   CL 111  --  114* 114* 117* 121*  --  124*  --   CO2 19*  --  19* _0 --  27  --   GLUCOSE 162*  --  210* 158* 236* 209*  --  156*  --   BUN 8  --  10 19 30* 46*  --  60*  --   CREATININE 0.63  --  0.81 0.79 0.73 0.88  --  0.87  --   CALCIUM 7.8*  --  7.9* 8.5* 8.2* 8.5*  --  8.4*  --   MG 2.0 1.9  --  2.4  --   --  2.3  --  2.3  PHOS 2.3* 2.6  --  5.3*  --   --  4.1  --  4.4   Estimated Creatinine Clearance: 79.9 mL/min (by C-G formula based on SCr of 0.87 mg/dL).  LIVER Recent Labs  Lab 03/22/18 0924 03/28/18 0433  AST  --  106*  ALT  --  93*  ALKPHOS  --  100  BILITOT  --  0.8  PROT  --  5.4*  ALBUMIN  --  2.5*  INR 1.04  --    INFECTIOUS Recent Labs  Lab 03/28/18 0229 03/28/18 0433 03/29/18 0443  LATICACIDVEN 1.7  --   --   PROCALCITON  --  0.27 0.39   ENDOCRINE CBG (last 3)  Recent Labs    03/29/18 0020 03/29/18 0354 03/29/18 0744  GLUCAP 147* 137* 131*   IMAGING x48h  - image(s) personally visualized  -   highlighted in bold Dg Chest Port 1 View  Result Date: 03/29/2018 CLINICAL DATA:  Endotracheal tube present EXAM: PORTABLE CHEST 1 VIEW COMPARISON:  Yesterday FINDINGS: Endotracheal tube tip just below the clavicular heads. An orogastric tube and side-port reaches the stomach. An upper extremity PICC has been placed with tip at the upper cavoatrial junction. Right porta catheter with tip at the SVC. Haziness of the lower chest likely from atelectasis and pleural fluid. No Kerley lines. No pneumothorax. Borderline heart size. IMPRESSION: 1. Interval placement of PICC that is in good position. 2. Hazy lower lung opacities favoring atelectasis and pleural fluid. Electronically Signed   By: Monte Fantasia M.D.   On: 03/29/2018 07:20   Dg Chest Port 1 View  Result Date: 03/28/2018 CLINICAL  DATA:  Endotracheal tube EXAM: PORTABLE CHEST 1 VIEW COMPARISON:  03/27/2018 FINDINGS: Endotracheal tube terminates 4 cm above the carina. Cardiomegaly with pulmonary vascular congestion. No frank interstitial edema. Right chest port terminates in the mid SVC. Small bilateral pleural effusions. Associated lower lobe opacities, likely atelectasis. IMPRESSION: Endotracheal tube terminates 4 cm above the carina. Cardiomegaly with pulmonary vascular congestion. No frank interstitial edema. Small bilateral pleural effusions. Associated lower lobe opacities, likely atelectasis. Electronically Signed   By: Julian Hy M.D.   On: 03/28/2018 07:23   Dg Chest Port 1 View  Result Date: 03/27/2018 CLINICAL DATA:  Decreased oxygen saturation EXAM: PORTABLE CHEST 1 VIEW COMPARISON:  Chest radiograph 03/27/2018 FINDINGS: ET tube terminates in the mid trachea. Right anterior chest wall Port-A-Cath tip projects over the superior vena cava. Enteric tube courses inferior to the diaphragm. Multiple monitoring leads overlie the patient. Low lung volumes. Stable cardiomegaly. Bibasilar heterogeneous opacities. IMPRESSION: Low lung volumes with basilar opacities which may represent atelectasis or infection. ET tube mid trachea. Electronically Signed   By: Lovey Newcomer M.D.   On: 03/27/2018 16:12   [place] Ekg Site Rite  Result Date: 03/28/2018 If Site Rite image not attached, placement could not be confirmed due to current cardiac rhythm.   ASSESSMENT AND PLAN   History of tobacco abuse Acute respiratory failure with hypoxia, hypercapnia Postextubation stridor 9/11.-  with acute bronchospasm 9/13 and reintubated. 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 - Continue nimotop -  Keppra for seizure prophylaxis - Goal SBP < 160 - continuje cardene - Coreg to home dose  Acute metabolic encephalopathy. - Currently sedated and paralyzed, d/c nimbex but continue lasix  Volume overload  - Lasix as above - Free water for diuresis induced hypernatremia - Replace K - BMET in AM  Aspiration pneumonia. E coli UTI. - Day 6 of Abx;  Rocephin - Still febrile; check PCT  Hx of HTN. - Coreg - increase dose 03/28/18 - PRN hydralazine - Cardene drip  Stage 4 breast cancer s/p mastectomy, XRT to brain met, and recently on chemo. Chemo induced anemia, thrombocytopenia. - Followed by oncology in  - F/u CBC  Hyperglycemia. - SSI  SUMMARY OF TODAY'S PLAN:  Family updated at length bedside.  Goals of today is to stop paralytics and begin active diureses today but maintain on full support, no change in vent, F/U ABG now, maintain sedated today.  Best Practice / Goals of Care / Disposition.   DVT PROPHYLAXIS: SCDs SUP: protonix NUTRITION: tube feeds MOBILITY: bed rest GOALS OF CARE: full code FAMILY DISCUSSIONS:  Updated family at bedside 9/13. Husband updated 03/27/18, Husband 03/28/18  The patient is critically ill with multiple organ systems failure and requires high complexity decision making for assessment and support, frequent evaluation and titration of therapies, application of advanced monitoring technologies and extensive interpretation of multiple databases.   Critical Care Time devoted to patient care services described in this note is  34  Minutes. This time reflects time of care of this signee Dr Jennet Maduro. This critical care time does not reflect procedure time, or teaching time or supervisory time of PA/NP/Med student/Med Resident etc but could involve care discussion time.  Rush Farmer, M.D. [facility] Pulmonary/Critical Care Medicine. Pager: [phone]. After hours pager: [phone].  03/29/2018 8:54  AM

## 2018-03-29 NOTE — Progress Notes (Signed)
Transcranial Doppler  Date POD PCO2 HCT BP  MCA ACA PCA OPHT SIPH VERT Basilar  03/24/18 vs     Right  Left   92  13   -58  -52   19  *   28  *   137  *   93  *   90      9/13 MS     Right  Left   124  34   -70  *   -39  *   *  12   *  -26   -36  -53   -61      9/16 MS     Right  Left   64  17   -51  *   -24  *   22  14   42  42   -30  -30     -30          Right  Left                                             Right  Left                                            Right  Left                                            Right  Left                                        MCA = Middle Cerebral Artery      OPHT = Opthalmic Artery     BASILAR = Basilar Artery   ACA = Anterior Cerebral Artery     SIPH = Carotid Siphon PCA = Posterior Cerebral Artery   VERT = Verterbral Artery                   Normal MCA = 62+\-12 ACA = 50+\-12 PCA = 42+\-23   03/24/2018 * Not found [place] ratio - Rt 0.99 Lt 0.12  03/26/18 *Unable to insonate Lind[place]ard ratio- Rt 2.95 Lt 0.55  03/29/18 *Unable to insonate [place] ratio- Rt 2.29 Lt 0.40   03/29/2018 3:01 PM Maudry Mayhew, MHA, RVT, RDCS, RDMS

## 2018-03-30 ENCOUNTER — Inpatient Hospital Stay (HOSPITAL_COMMUNITY): Payer: Medicaid Other

## 2018-03-30 DIAGNOSIS — E870 Hyperosmolality and hypernatremia: Secondary | ICD-10-CM

## 2018-03-30 LAB — POCT I-STAT 3, ART BLOOD GAS (G3+)
ACID-BASE EXCESS: 7 mmol/L — AB (ref 0.0–2.0)
BICARBONATE: 32.2 mmol/L — AB (ref 20.0–28.0)
O2 Saturation: 96 %
PH ART: 7.449 (ref 7.350–7.450)
PO2 ART: 81 mmHg — AB (ref 83.0–108.0)
Patient temperature: 98.7
TCO2: 34 mmol/L — ABNORMAL HIGH (ref 22–32)
pCO2 arterial: 46.5 mmHg (ref 32.0–48.0)

## 2018-03-30 LAB — GLUCOSE, CAPILLARY
GLUCOSE-CAPILLARY: 146 mg/dL — AB (ref 70–99)
GLUCOSE-CAPILLARY: 160 mg/dL — AB (ref 70–99)
Glucose-Capillary: 138 mg/dL — ABNORMAL HIGH (ref 70–99)
Glucose-Capillary: 167 mg/dL — ABNORMAL HIGH (ref 70–99)
Glucose-Capillary: 134 mg/dL — ABNORMAL HIGH (ref 70–99)
Glucose-Capillary: 145 mg/dL — ABNORMAL HIGH (ref 70–99)

## 2018-03-30 LAB — CBC WITH DIFFERENTIAL/PLATELET
Abs Immature Granulocytes: 0.2 10*3/uL — ABNORMAL HIGH (ref 0.0–0.1)
BASOS ABS: 0 10*3/uL (ref 0.0–0.1)
BASOS PCT: 0 %
EOS PCT: 0 %
Eosinophils Absolute: 0 10*3/uL (ref 0.0–0.7)
HCT: 27.6 % — ABNORMAL LOW (ref 36.0–46.0)
Hemoglobin: 8.4 g/dL — ABNORMAL LOW (ref 12.0–15.0)
Immature Granulocytes: 2 %
Lymphocytes Relative: 12 %
Lymphs Abs: 1.4 10*3/uL (ref 0.7–4.0)
MCH: 30.5 pg (ref 26.0–34.0)
MCHC: 30.4 g/dL (ref 30.0–36.0)
MCV: 100.4 fL — ABNORMAL HIGH (ref 78.0–100.0)
MONO ABS: 1.6 10*3/uL — AB (ref 0.1–1.0)
MONOS PCT: 14 %
Neutro Abs: 8.3 10*3/uL — ABNORMAL HIGH (ref 1.7–7.7)
Neutrophils Relative %: 72 %
PLATELETS: 121 10*3/uL — AB (ref 150–400)
RBC: 2.75 MIL/uL — ABNORMAL LOW (ref 3.87–5.11)
RDW: 19.7 % — ABNORMAL HIGH (ref 11.5–15.5)
WBC: 11.6 10*3/uL — ABNORMAL HIGH (ref 4.0–10.5)

## 2018-03-30 LAB — BASIC METABOLIC PANEL
Anion gap: 18 — ABNORMAL HIGH (ref 5–15)
BUN: 68 mg/dL — AB (ref 6–20)
CHLORIDE: 114 mmol/L — AB (ref 98–111)
CO2: 28 mmol/L (ref 22–32)
CREATININE: 0.97 mg/dL (ref 0.44–1.00)
Calcium: 9.2 mg/dL (ref 8.9–10.3)
GFR calc non Af Amer: >60 mL/min (ref >60)
Glucose, Bld: 165 mg/dL — ABNORMAL HIGH (ref 70–99)
Potassium: 3.4 mmol/L — ABNORMAL LOW (ref 3.5–5.1)
SODIUM: 160 mmol/L — AB (ref 135–145)

## 2018-03-30 LAB — PROCALCITONIN: Procalcitonin: 0.46 ng/mL

## 2018-03-30 LAB — MAGNESIUM: MAGNESIUM: 2.4 mg/dL (ref 1.7–2.4)

## 2018-03-30 LAB — PHOSPHORUS: Phosphorus: 6 mg/dL — ABNORMAL HIGH (ref 2.5–4.6)

## 2018-03-30 MED ORDER — FENTANYL BOLUS VIA INFUSION
50.0000 ug | INTRAVENOUS | Status: DC | PRN
  Filled 2018-03-30: qty 50

## 2018-03-30 MED ORDER — POTASSIUM CHLORIDE 10 MEQ/50ML IV SOLN
10.0000 meq | INTRAVENOUS | Status: DC
  Administered 2018-03-30 (×2): 10 meq via INTRAVENOUS
  Filled 2018-03-30 (×4): qty 50

## 2018-03-30 MED ORDER — FENTANYL 2500MCG IN NS 250ML (10MCG/ML) PREMIX INFUSION
0.0000 ug/h | INTRAVENOUS | Status: DC
  Administered 2018-03-30 – 2018-03-31 (×2): 250 ug/h via INTRAVENOUS
  Administered 2018-03-31: 200 ug/h via INTRAVENOUS
  Administered 2018-04-01: 250 ug/h via INTRAVENOUS
  Administered 2018-04-01: 200 ug/h via INTRAVENOUS
  Administered 2018-04-01: 250 ug/h via INTRAVENOUS
  Administered 2018-04-02: 300 ug/h via INTRAVENOUS
  Filled 2018-03-30 (×7): qty 250

## 2018-03-30 MED ORDER — POTASSIUM CHLORIDE 20 MEQ/15ML (10%) PO SOLN
20.0000 meq | Freq: Once | ORAL | Status: AC
  Administered 2018-03-30: 20 meq
  Filled 2018-03-30: qty 15

## 2018-03-30 NOTE — Progress Notes (Signed)
[place] Progress Note Patient Name: Diane Short DOB: 1956-05-25 MRN: [id number]   Date of Service  03/30/2018  HPI/Events of Note  K+ = 3.4 and Creatinine = 0.97.  eICU Interventions  Will replace K+.     Intervention Category Major Interventions: Electrolyte abnormality - evaluation and management  Sommer,Steven Eugene 03/30/2018, 5:34 AM

## 2018-03-30 NOTE — Progress Notes (Signed)
PULMONARY / CRITICAL CARE MEDICINE   NAME:  Diane Short, MRN:  [id number], DOB:  1956-02-21, LOS: 8 ADMISSION DATE:  03/22/2018, CONSULTATION DATE: 03/22/2018 REFERRING MD:  Dr. Leonette Monarch, ER, CHIEF COMPLAINT:  Headache  BRIEF HISTORY:    62 yo female smoker with hx of cerebral aneurysms s/p intervention presented with persistent headache, and altered mental status.  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  Intubated for airway protection.  Nimodipine started.  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  SIGNIFICANT PAST MEDICAL HISTORY   Stage 4 Breast cancer, Neuropathy, HTN, Hiatal hernia, GERD, Depression  SIGNIFICANT EVENTS:  9/09 Admit 9/10 cerebral angiogram with coiling 9/11 extubated >> reintubated due to stridor and start steroids; start ABx for UTI, aspiration 9/13 wheezing, respiratory acidosis >> paralytic x 1, deep sedation. STarted on nimbex and solumedrol 9/14-  on nimbex since yesterday due to vent dynschorny. On fent, versed and diprivan. BIS 40-60. Pulse ox 89%, 45% fio2, peep - 5, On Cardene  For goal sbp < 160. Currently 135. On tube feeds. Lot of 3rd spacing.  +. Rt hand bruising - worse per RN but pulse rt radial, rt ulnar and cap refill on right hand. Started lasix 9/16 remains paralyzed, diuresed, attempt to get paralytics off today 9/17 diuresed very well overnight but Na increased, tolerated nimbex off, minimizing sedation and holding diureses for today, decrease PEEP and FiO2 to 5 and 30%.  STUDIES:   CT head 9/09 >> SAH, mild hydrocephalus EEG 9/09 >> slowing CT head 9/10 >> stable SAH and IVH Echo 9/10 >> left ventricular EF 55 to 60%.  Grade 1 diastolic dysfunction.  CULTURES:  Urine 9/09 >> E coli Sputum 9/09 >> oral flora Blood 9/09 >>   ANTIBIOTICS:  Zosyn 9/11 >> 9/13 Rocephin 9/13 >> 9/17  LINES/TUBES:  Rt port >> ETT 9/09 >> 9/11 ETT 9/11 >> Rt radial aline 9/13 >>   CONSULTANTS:  Neurosurgery 9/09  >>  SUBJECTIVE:  No events overnight, paralytics off  CONSTITUTIONAL: BP (!) 129/58   Pulse 73   Temp 99.7 F (37.6 C) (Axillary)   Resp (!) 21   Ht '5\' 4"'$  (1.626 m)   Wt 102 kg   LMP 08/14/2010 (Exact Date)   SpO2 100%   BMI 38.60 kg/m   I/O last 3 completed shifts: In: 6295 [I.V.:5034.1; NG/GT:1335.8; IV Piggyback:150.1] Out: 11225 [Urine:10225; Stool:1000]    Vent Mode: PRVC FiO2 (%):  [40 %] 40 % Set Rate:  [20 bmp] 20 bmp Vt Set:  [440 mL] 440 mL PEEP:  [10 cmH20] 10 cmH20 Plateau Pressure:  [20 cmH20-25 cmH20] 23 cmH20  PHYSICAL EXAM: General Appearance:  Critically ill appearing, sedate, no complaints Head:  [place]/AT Eyes:  PERRL, EOM-I and MMM Ears:  WNL Throat:  ETT and OGT Neck:  Supple, -JVD Lungs: Coarse BS diffusely Heart:  RRR, Nl S1/S2 and -M/R/G Abdomen:  Soft, NT, ND and +BS Extremities:  Flank edema, -tenderness Skin:  Intact Neurologic:  Opens eyes, gags, not following commands, off paralytics  LABS   PULMONARY Recent Labs  Lab 03/26/18 0631 03/26/18 0743 03/27/18 0313 03/27/18 1822 03/28/18 0315 03/29/18 0927 03/30/18 0403  PHART 7.154* 7.248* 7.233* 7.290* 7.260* 7.440 7.449  PCO2ART 71.7* 54.1* 58.1* 51.3* 58.1* 42.7 46.5  PO2ART 158.0* 100.0 63.1* 81.8* 104.0 69.0* 81.0*  HCO3 25.3 23.6 23.7 24.0 25.2 29.0* 32.2*  TCO2 28 25  --   --  27 30 34*  O2SAT 99.0 96.0 88.6 95.6 95.0 94.0 96.0   CBC Recent Labs  Lab 03/28/18 0433 03/29/18 0443 03/30/18 0341  HGB 8.3* 7.9* 8.4*  HCT 27.9* 26.4* 27.6*  WBC 17.0* 12.7* 11.6*  PLT 85* 106* 121*   COAGULATION No results for input(s): INR in the last 168 hours. CARDIAC  No results for input(s): TROPONINI in the last 168 hours. No results for input(s): PROBNP in the last 168 hours.  CHEMISTRY Recent Labs  Lab 03/24/18 1714  03/26/18 0550 03/27/18 0519 03/28/18 0229 03/28/18 0433 03/28/18 2350 03/29/18 0443 03/30/18 0341  NA  --    < > 147* 148* 154*  --  157*  --  160*  K   --    < > 4.0 3.4* 4.0  --  3.8  --  3.4*  CL  --    < > 114* 117* 121*  --  124*  --  114*  CO2  --    < > _0 --  27  --  28  GLUCOSE  --    < > 158* 236* 209*  --  156*  --  165*  BUN  --    < > 19 30* 46*  --  60*  --  68*  CREATININE  --    < > 0.79 0.73 0.88  --  0.87  --  0.97  CALCIUM  --    < > 8.5* 8.2* 8.5*  --  8.4*  --  9.2  MG 1.9  --  2.4  --   --  2.3  --  2.3 2.4  PHOS 2.6  --  5.3*  --   --  4.1  --  4.4 6.0*   < > = values in this interval not displayed.   Estimated Creatinine Clearance: 71.7 mL/min (by C-G formula based on SCr of 0.97 mg/dL).  LIVER Recent Labs  Lab 03/28/18 0433  AST 106*  ALT 93*  ALKPHOS 100  BILITOT 0.8  PROT 5.4*  ALBUMIN 2.5*   INFECTIOUS Recent Labs  Lab 03/28/18 0229 03/28/18 0433 03/29/18 0443 03/30/18 0341  LATICACIDVEN 1.7  --   --   --   PROCALCITON  --  0.27 0.39 0.46   ENDOCRINE CBG (last 3)  Recent Labs    03/29/18 2329 03/30/18 0331 03/30/18 0743  GLUCAP 125* 138* 146*   IMAGING x48h  - image(s) personally visualized  -   highlighted in bold Dg Chest Port 1 View  Result Date: 03/30/2018 CLINICAL DATA:  62 year old female with metastatic breast cancer. Previously treated intracranial aneurysms. 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 EXAM: PORTABLE CHEST 1 VIEW COMPARISON:  03/29/2018 and earlier. FINDINGS: Portable AP semi upright view at 0543 hours. Endotracheal tube tip in good position between the level the clavicles and carina. Stable right chest power port, no longer accessed. Stable right IJ central line. Enteric tube courses to the left abdomen, tip not included but side hole appears stable at the gastric fundus level. Stable lung volumes in ventilation with right greater than left veiling bibasilar opacity. No pneumothorax or pulmonary edema. Mediastinal contours remain normal. Paucity of bowel gas in the upper abdomen.  IMPRESSION: 1.  Stable lines and tubes. 2. Stable ventilation with small bilateral pleural effusions and lower lobe collapse or consolidation. Electronically Signed   By: Genevie Ann M.D.   On: 03/30/2018 09:19   Dg Chest [facility]  Result Date: 03/29/2018 CLINICAL DATA:  Endotracheal tube present EXAM: PORTABLE CHEST 1 VIEW COMPARISON:  Yesterday FINDINGS: Endotracheal tube tip just below the clavicular heads. An orogastric tube and side-port reaches the stomach. An upper extremity PICC has been placed with tip at the upper cavoatrial junction. Right porta catheter with tip at the SVC. Haziness of the lower chest likely from atelectasis and pleural fluid. No Kerley lines. No pneumothorax. Borderline heart size. IMPRESSION: 1. Interval placement of PICC that is in good position. 2. Hazy lower lung opacities favoring atelectasis and pleural fluid. Electronically Signed   By: Monte Fantasia M.D.   On: 03/29/2018 07:20   [place] Ekg Site Rite  Result Date: 03/28/2018 If Site Rite image not attached, placement could not be confirmed due to current cardiac rhythm.   ASSESSMENT AND PLAN   History of tobacco abuse Acute respiratory failure with hypoxia, hypercapnia Postextubation stridor 9/11.-  with acute bronchospasm 9/13 and reintubated. 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 - Continue nimotop - Keppra for seizure prophylaxis - Goal SBP <140-160 - cardene drip, minimize as able - Coreg to home dose  Acute metabolic encephalopathy. - Begin minimizing sedation - Hold diureses and continue free water for hypernatremia  Volume overload  - Hold lasix - Free water for diuresis  induced hypernatremia - Replace K - BMET in AM  Aspiration pneumonia. E coli UTI. - Day 7 of Abx;  Rocephin, D/C after today - Still febrile; check PCT  Hx of HTN. - Coreg - increase dose 03/28/18 - PRN hydralazine - Cardene drip  Stage 4 breast cancer s/p mastectomy, XRT to brain met, and recently on chemo. Chemo induced anemia, thrombocytopenia. - Followed by oncology in [place] - F/u CBC  Hyperglycemia. - SSI - CBGs  SUMMARY OF TODAY'S PLAN:  diuresed very well overnight but Na increased, tolerated nimbex off, minimizing sedation and holding diureses for today, decrease PEEP and FiO2 to 5 and 30%.  Family updated bedside  Best Practice / Goals of Care / Disposition.   DVT PROPHYLAXIS: SCDs SUP: protonix NUTRITION: tube feeds MOBILITY: bed rest GOALS OF CARE: full code FAMILY DISCUSSIONS:  Updated family at bedside 9/13. Husband updated 03/27/18, Husband 03/28/18  The patient is critically ill with multiple organ systems failure and requires high complexity decision making for assessment and support, frequent evaluation and titration of therapies, application of advanced monitoring technologies and extensive interpretation of multiple databases.   Critical Care Time devoted to patient care services described in this note is  36  Minutes. This time reflects time of care of this signee Dr Jennet Maduro. This critical care time does not reflect procedure time, or teaching time or supervisory time of PA/NP/Med student/Med Resident etc but could involve care discussion time.  Rush Farmer, M.D. [facility] Pulmonary/Critical Care Medicine. Pager: [phone]. After hours pager: [phone].  03/30/2018 9:50 AM

## 2018-03-30 NOTE — Care Management Note (Signed)
Case Management Note  Patient Details  Name: Diane Short MRN: [id number] Date of Birth: 03-03-1956  Subjective/Objective:    Breast Cancer with kBrain mets with SAH, coiled aneurysm, conts on full vent support.                 Action/Plan: NCM will follow for transition of care needs.   Expected Discharge Date:                  Expected Discharge Plan:     In-House Referral:     Discharge planning Services  CM Consult  Post Acute Care Choice:    Choice offered to:     DME Arranged:    DME Agency:     HH Arranged:    HH Agency:     Status of Service:  In process, will continue to follow  If discussed at Long Length of Stay Meetings, dates discussed:    Additional Comments:  Zenon Mayo, RN 03/30/2018, 4:09 PM

## 2018-03-30 NOTE — Progress Notes (Signed)
  NEUROSURGERY PROGRESS NOTE   Pt seen and examined. No issues overnight.   EXAM: Temp:  [98.6 F (37 C)-99.7 F (37.6 C)] 99.7 F (37.6 C) (09/17 0745) Pulse Rate:  [57-86] 73 (09/17 0900) Resp:  [20-23] 21 (09/17 0725) BP: (114-158)/(48-103) 129/58 (09/17 0900) SpO2:  [98 %-100 %] 100 % (09/17 0900) Arterial Line BP: (116-180)/(45-60) 133/45 (09/17 0900) FiO2 (%):  [40 %] 40 % (09/17 0725) Intake/Output      09/16 0701 - 09/17 0700 09/17 0701 - 09/18 0700   I.V. (mL/kg) 2651.7 (26) 277.6 (2.7)   NG/GT 935.8 100   IV Piggyback 150.1 150.1   Total Intake(mL/kg) 3737.6 (36.6) 527.7 (5.2)   Urine (mL/kg/hr) 7200 (2.9) 1275 (2.7)   Stool 700    Total Output 7900 1275   Net -4162.4 -747.3         Minimal eye opening Occasional w/d to pain, not following commands  LABS: Lab Results  Component Value Date   CREATININE 0.97 03/30/2018   BUN 68 (H) 03/30/2018   NA 160 (H) 03/30/2018   K 3.4 (L) 03/30/2018   CL 114 (H) 03/30/2018   CO2 28 03/30/2018   Lab Results  Component Value Date   WBC 11.6 (H) 03/30/2018   HGB 8.4 (L) 03/30/2018   HCT 27.6 (L) 03/30/2018   MCV 100.4 (H) 03/30/2018   PLT 121 (L) 03/30/2018   TCD: 03/24/18 vs     Right  Left   92  13   -58  -52   19  *   28  *   137  *   93  *   90      9/13 MS     Right  Left   124  34   -70  *   -39  *   *  12   *  -26   -36  -53   -61      9/16 MS     Right  Left   64  17   -51  *   -24  *   22  14   42  42   -30  -30     -30     Not concerning for spasm  IMPRESSION: - 62 y.o. female [place] d# 7 s/p coiling left PICA.  - weaning sedation - hypernatremia likely related to diuresis for fluid overload  PLAN: - Cont to wean sedation as tolerated for vent - Cont Nimotop, TCD tomorrow

## 2018-03-30 NOTE — Progress Notes (Signed)
RT note: ventilator changes made by MD at 0935 AM.  PEEP decreased from 10 to 5 and FIO2 decreased from 40% to 30%.  Will continue to monitor.

## 2018-03-31 ENCOUNTER — Inpatient Hospital Stay (HOSPITAL_COMMUNITY): Payer: Medicaid Other

## 2018-03-31 DIAGNOSIS — E876 Hypokalemia: Secondary | ICD-10-CM

## 2018-03-31 DIAGNOSIS — I609 Nontraumatic subarachnoid hemorrhage, unspecified: Secondary | ICD-10-CM

## 2018-03-31 LAB — POCT I-STAT 3, ART BLOOD GAS (G3+)
Acid-Base Excess: 6 mmol/L — ABNORMAL HIGH (ref 0.0–2.0)
Bicarbonate: 29.7 mmol/L — ABNORMAL HIGH (ref 20.0–28.0)
O2 SAT: 96 %
PCO2 ART: 36.7 mmHg (ref 32.0–48.0)
Patient temperature: 99.1
TCO2: 31 mmol/L (ref 22–32)
pH, Arterial: 7.517 — ABNORMAL HIGH (ref 7.350–7.450)
pO2, Arterial: 71 mmHg — ABNORMAL LOW (ref 83.0–108.0)

## 2018-03-31 LAB — BASIC METABOLIC PANEL
ANION GAP: 11 (ref 5–15)
ANION GAP: 9 (ref 5–15)
Anion gap: 9 (ref 5–15)
BUN: 56 mg/dL — ABNORMAL HIGH (ref 6–20)
BUN: 49 mg/dL — AB (ref 6–20)
BUN: 42 mg/dL — ABNORMAL HIGH (ref 6–20)
CALCIUM: 9 mg/dL (ref 8.9–10.3)
CALCIUM: 9 mg/dL (ref 8.9–10.3)
CHLORIDE: 119 mmol/L — AB (ref 98–111)
CO2: 29 mmol/L (ref 22–32)
CO2: 29 mmol/L (ref 22–32)
CO2: 28 mmol/L (ref 22–32)
CREATININE: 0.75 mg/dL (ref 0.44–1.00)
Calcium: 9 mg/dL (ref 8.9–10.3)
Chloride: 116 mmol/L — ABNORMAL HIGH (ref 98–111)
Chloride: 119 mmol/L — ABNORMAL HIGH (ref 98–111)
Creatinine, Ser: 0.81 mg/dL (ref 0.44–1.00)
Creatinine, Ser: 0.69 mg/dL (ref 0.44–1.00)
GFR calc Af Amer: >60 mL/min (ref >60)
GFR calc Af Amer: >60 mL/min (ref >60)
GFR calc non Af Amer: >60 mL/min (ref >60)
GFR calc non Af Amer: >60 mL/min (ref >60)
GLUCOSE: 155 mg/dL — AB (ref 70–99)
Glucose, Bld: 155 mg/dL — ABNORMAL HIGH (ref 70–99)
Glucose, Bld: 131 mg/dL — ABNORMAL HIGH (ref 70–99)
POTASSIUM: 2.8 mmol/L — AB (ref 3.5–5.1)
POTASSIUM: 3.8 mmol/L (ref 3.5–5.1)
Potassium: 3.7 mmol/L (ref 3.5–5.1)
SODIUM: 156 mmol/L — AB (ref 135–145)
Sodium: 157 mmol/L — ABNORMAL HIGH (ref 135–145)
Sodium: 156 mmol/L — ABNORMAL HIGH (ref 135–145)

## 2018-03-31 LAB — CBC WITH DIFFERENTIAL/PLATELET
ABS IMMATURE GRANULOCYTES: 0.2 10*3/uL — AB (ref 0.0–0.1)
Basophils Absolute: 0 10*3/uL (ref 0.0–0.1)
Basophils Relative: 0 %
EOS PCT: 0 %
Eosinophils Absolute: 0 10*3/uL (ref 0.0–0.7)
HEMATOCRIT: 26.4 % — AB (ref 36.0–46.0)
HEMOGLOBIN: 8 g/dL — AB (ref 12.0–15.0)
IMMATURE GRANULOCYTES: 2 %
LYMPHS ABS: 1.1 10*3/uL (ref 0.7–4.0)
LYMPHS PCT: 13 %
MCH: 30.7 pg (ref 26.0–34.0)
MCHC: 30.3 g/dL (ref 30.0–36.0)
MCV: 101.1 fL — ABNORMAL HIGH (ref 78.0–100.0)
Monocytes Absolute: 1.5 10*3/uL — ABNORMAL HIGH (ref 0.1–1.0)
Monocytes Relative: 17 %
NEUTROS ABS: 5.9 10*3/uL (ref 1.7–7.7)
Neutrophils Relative %: 68 %
Platelets: 135 10*3/uL — ABNORMAL LOW (ref 150–400)
RBC: 2.61 MIL/uL — AB (ref 3.87–5.11)
RDW: 20.2 % — ABNORMAL HIGH (ref 11.5–15.5)
WBC: 8.7 10*3/uL (ref 4.0–10.5)

## 2018-03-31 LAB — GLUCOSE, CAPILLARY
Glucose-Capillary: 139 mg/dL — ABNORMAL HIGH (ref 70–99)
Glucose-Capillary: 166 mg/dL — ABNORMAL HIGH (ref 70–99)
Glucose-Capillary: 176 mg/dL — ABNORMAL HIGH (ref 70–99)
Glucose-Capillary: 123 mg/dL — ABNORMAL HIGH (ref 70–99)
Glucose-Capillary: 135 mg/dL — ABNORMAL HIGH (ref 70–99)
Glucose-Capillary: 107 mg/dL — ABNORMAL HIGH (ref 70–99)

## 2018-03-31 LAB — MAGNESIUM: Magnesium: 2.3 mg/dL (ref 1.7–2.4)

## 2018-03-31 LAB — PHOSPHORUS: PHOSPHORUS: 4.6 mg/dL (ref 2.5–4.6)

## 2018-03-31 MED ORDER — POTASSIUM CHLORIDE 10 MEQ/50ML IV SOLN
10.0000 meq | INTRAVENOUS | Status: AC
  Administered 2018-03-31 (×4): 10 meq via INTRAVENOUS
  Filled 2018-03-31 (×4): qty 50

## 2018-03-31 MED ORDER — ENOXAPARIN SODIUM 30 MG/0.3ML ~~LOC~~ SOLN
30.0000 mg | SUBCUTANEOUS | Status: DC
  Administered 2018-03-31 – 2018-04-16 (×17): 30 mg via SUBCUTANEOUS
  Filled 2018-03-31 (×18): qty 0.3

## 2018-03-31 MED ORDER — CLONIDINE HCL 0.1 MG/24HR TD PTWK
0.1000 mg | MEDICATED_PATCH | TRANSDERMAL | Status: DC
  Administered 2018-03-31: 0.1 mg via TRANSDERMAL
  Filled 2018-03-31: qty 1

## 2018-03-31 MED ORDER — POTASSIUM CHLORIDE 20 MEQ/15ML (10%) PO SOLN
40.0000 meq | ORAL | Status: AC
  Administered 2018-03-31 (×2): 40 meq
  Filled 2018-03-31 (×2): qty 30

## 2018-03-31 MED ORDER — AMLODIPINE BESYLATE 10 MG PO TABS
10.0000 mg | ORAL_TABLET | Freq: Every day | ORAL | Status: DC
  Administered 2018-03-31: 10 mg via ORAL
  Filled 2018-03-31 (×2): qty 1

## 2018-03-31 NOTE — Progress Notes (Signed)
Transcranial Doppler  Date POD PCO2 HCT BP  MCA ACA PCA OPHT SIPH VERT Basilar  03/24/18 vs     Right  Left   92  13   -58  -52   19  *   28  *   137  *   93  *   90      9/13 MS     Right  Left   124  34   -70  *   -39  *   *  12   *  -26   -36  -53   -61      9/16 MS     Right  Left   64  17   -51  *   -24  *   22  14   42  42   -30  -30     -30    9/18 JE      Right  Left   47  *   -31  *   16  *   41  18   49  34   *  *   *            Right  Left                                            Right  Left                                            Right  Left                                        MCA = Middle Cerebral Artery      OPHT = Opthalmic Artery     BASILAR = Basilar Artery   ACA = Anterior Cerebral Artery     SIPH = Carotid Siphon PCA = Posterior Cerebral Artery   VERT = Verterbral Artery                   Normal MCA = 62+\-12 ACA = 50+\-12 PCA = 42+\-23   03/24/2018 * Not found Lindegaard ratio - Rt 0.99 Lt 0.12  03/26/18 *Unable to insonate Lindegaard ratio- Rt 2.95 Lt 0.55  03/29/18 *Unable to insonate Lindegaard ratio- Rt 2.29 Lt 0.40  9/18 * unable to insonate. Technically difficult due to patient constantly moving head. je    03/31/2018 3:42 PM

## 2018-03-31 NOTE — Progress Notes (Signed)
[facility] ADULT ICU REPLACEMENT PROTOCOL FOR AM LAB REPLACEMENT ONLY  The patient does apply for the [facility] Adult ICU Electrolyte Replacment Protocol based on the criteria listed below:   1. Is GFR >/= 40 ml/min? Yes.    Patient's GFR today is >60 2. Is urine output >/= 0.5 ml/kg/hr for the last 6 hours? Yes.   Patient's UOP is 1 ml/kg/hr 3. Is BUN < 60 mg/dL? Yes.    Patient's BUN today is 56 4. Abnormal electrolyte(s):  K-2.8 5. Ordered repletion with: per protocol 6. If a panic level lab has been reported, has the CCM MD in charge been notified? Yes.  .   Physician:  Dr. Terrill Mohr, Philis Nettle 03/31/2018 6:09 AM

## 2018-03-31 NOTE — Progress Notes (Signed)
PULMONARY / CRITICAL CARE MEDICINE   NAME:  Diane Short, MRN:  [id number], DOB:  1955-11-02, LOS: 9 ADMISSION DATE:  03/22/2018, CONSULTATION DATE: 03/22/2018 REFERRING MD:  Dr. Leonette Monarch, ER, CHIEF COMPLAINT:  Headache  BRIEF HISTORY:    62 yo female smoker with hx of cerebral aneurysms s/p intervention presented with persistent headache, and altered mental status.  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  Intubated for airway protection.  Nimodipine started.  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  SIGNIFICANT PAST MEDICAL HISTORY   Stage 4 Breast cancer, Neuropathy, HTN, Hiatal hernia, GERD, Depression  SIGNIFICANT EVENTS:  9/09 Admit 9/10 cerebral angiogram with coiling 9/11 extubated >> reintubated due to stridor and start steroids; start ABx for UTI, aspiration 9/13 wheezing, respiratory acidosis >> paralytic x 1, deep sedation. STarted on nimbex and solumedrol 9/14-  on nimbex since yesterday due to vent dynschorny. On fent, versed and diprivan. BIS 40-60. Pulse ox 89%, 45% fio2, peep - 5, On Cardene  For goal sbp < 160. Currently 135. On tube feeds. Lot of 3rd spacing.  +. Rt hand bruising - worse per RN but pulse rt radial, rt ulnar and cap refill on right hand. Started lasix 9/16 remains paralyzed, diuresed, attempt to get paralytics off today 9/17 diuresed very well overnight but Na increased, tolerated nimbex off, minimizing sedation and holding diureses for today, decrease PEEP and FiO2 to 5 and 30%.  STUDIES:   CT head 9/09 >> SAH, mild hydrocephalus EEG 9/09 >> slowing CT head 9/10 >> stable SAH and IVH Echo 9/10 >> left ventricular EF 55 to 60%.  Grade 1 diastolic dysfunction.  CULTURES:  Urine 9/09 >> E coli Sputum 9/09 >> oral flora Blood 9/09 >>   ANTIBIOTICS:  Zosyn 9/11 >> 9/13 Rocephin 9/13 >> 9/17  LINES/TUBES:  Rt port >> ETT 9/09 >> 9/11 ETT 9/11 >> Rt radial aline 9/13 >>   CONSULTANTS:  Neurosurgery 9/09  >>  SUBJECTIVE:  No events overnight, arousable and following commands  CONSTITUTIONAL: BP (!) 148/65   Pulse (!) 101   Temp 99.9 F (37.7 C)   Resp (!) 25   Ht '5\' 4"'  (1.626 m)   Wt 102 kg   LMP 08/14/2010 (Exact Date)   SpO2 100%   BMI 38.60 kg/m   I/O last 3 completed shifts: In: 6147.3 [I.V.:3161.3; NG/GT:2785.8; IV Piggyback:200.1] Out: 9395 [BPZWC:5852; Stool:1550]    Vent Mode: PRVC FiO2 (%):  [30 %] 30 % Set Rate:  [20 bmp] 20 bmp Vt Set:  [440 mL] 440 mL PEEP:  [5 cmH20] 5 cmH20 Plateau Pressure:  [13 DPO24-23 cmH20] 16 cmH20  PHYSICAL EXAM: General Appearance:  Critically ill appearing, NAD Head:  /AT Eyes:  PERRL, EOM-I and MMM Ears:  WNL Throat:  ETT and OGT in place Neck:  Supple, -JVD and -thyromegally Lungs: Decreased BS diffusely Heart:  RRR, Nl S1/S2 and -M/R/G Abdomen:  Soft, NT, ND and +BS Extremities:  Flank edema, -tenderness Skin:  Intact Neurologic:  Opens eyes, gags, not following commands, off paralytics  LABS   PULMONARY Recent Labs  Lab 03/26/18 0743  03/27/18 1822 03/28/18 0315 03/29/18 0927 03/30/18 0403 03/31/18 0338  PHART 7.248*   < > 7.290* 7.260* 7.440 7.449 7.517*  PCO2ART 54.1*   < > 51.3* 58.1* 42.7 46.5 36.7  PO2ART 100.0   < > 81.8* 104.0 69.0* 81.0* 71.0*  HCO3 23.6   < > 24.0 25.2 29.0*  32.2* 29.7*  TCO2 25  --   --  27 30 34* 31  O2SAT 96.0   < > 95.6 95.0 94.0 96.0 96.0   < > = values in this interval not displayed.   CBC Recent Labs  Lab 03/29/18 0443 03/30/18 0341 03/31/18 0325  HGB 7.9* 8.4* 8.0*  HCT 26.4* 27.6* 26.4*  WBC 12.7* 11.6* 8.7  PLT 106* 121* 135*   COAGULATION No results for input(s): INR in the last 168 hours. CARDIAC  No results for input(s): TROPONINI in the last 168 hours. No results for input(s): PROBNP in the last 168 hours.  CHEMISTRY Recent Labs  Lab 03/26/18 0550 03/27/18 0519 03/28/18 0229 03/28/18 0433 03/28/18 2350 03/29/18 0443 03/30/18 0341 03/31/18 0325   NA 147* 148* 154*  --  157*  --  160* 156*  K 4.0 3.4* 4.0  --  3.8  --  3.4* 2.8*  CL 114* 117* 121*  --  124*  --  114* 116*  CO2 '25 22 24  ' --  27  --  28 29  GLUCOSE 158* 236* 209*  --  156*  --  165* 155*  BUN 19 30* 46*  --  60*  --  68* 56*  CREATININE 0.79 0.73 0.88  --  0.87  --  0.97 0.81  CALCIUM 8.5* 8.2* 8.5*  --  8.4*  --  9.2 9.0  MG 2.4  --   --  2.3  --  2.3 2.4 2.3  PHOS 5.3*  --   --  4.1  --  4.4 6.0* 4.6   Estimated Creatinine Clearance: 85.8 mL/min (by C-G formula based on SCr of 0.81 mg/dL).  LIVER Recent Labs  Lab 03/28/18 0433  AST 106*  ALT 93*  ALKPHOS 100  BILITOT 0.8  PROT 5.4*  ALBUMIN 2.5*   INFECTIOUS Recent Labs  Lab 03/28/18 0229 03/28/18 0433 03/29/18 0443 03/30/18 0341  LATICACIDVEN 1.7  --   --   --   PROCALCITON  --  0.27 0.39 0.46   ENDOCRINE CBG (last 3)  Recent Labs    03/30/18 2314 03/31/18 0340 03/31/18 0807  GLUCAP 160* 139* 166*   IMAGING x48h  - image(s) personally visualized  -   highlighted in bold Dg Chest Port 1 View  Result Date: 03/30/2018 CLINICAL DATA:  62 year old female with metastatic breast cancer. Previously treated intracranial aneurysms. 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 EXAM: PORTABLE CHEST 1 VIEW COMPARISON:  03/29/2018 and earlier. FINDINGS: Portable AP semi upright view at 0543 hours. Endotracheal tube tip in good position between the level the clavicles and carina. Stable right chest power port, no longer accessed. Stable right IJ central line. Enteric tube courses to the left abdomen, tip not included but side hole appears stable at the gastric fundus level. Stable lung volumes in ventilation with right greater than left veiling bibasilar opacity. No pneumothorax or pulmonary edema. Mediastinal contours remain normal. Paucity of bowel gas in the upper abdomen. IMPRESSION: 1.  Stable lines and tubes. 2. Stable ventilation  with small bilateral pleural effusions and lower lobe collapse or consolidation. Electronically Signed   By: Genevie Ann M.D.   On: 03/30/2018 09:19    ASSESSMENT AND PLAN   History of tobacco abuse Acute respiratory failure with hypoxia, hypercapnia Postextubation stridor 9/11.-  with acute bronchospasm 9/13 and reintubated. Needing deep sedatin and nimbex and solumedrol since 03/26/18  Plan - Begin PS trials  but no extubation - Maintain FiO2 to 30 and PEEP to 5 - Patient is autodiuresing at this point, will hold further lasix - D/Ced solumedrol - Wean sedation down as able - Scheduled BDs  SAH s/p coiling and embolization of PICA aneurysm 9/10. - Continue nimotop - Keppra for seizure prophylaxis - Goal SBP <140-160 - cardene drip, minimize as able - Coreg to home dose - Norvasc 10 mg PO daily with hold parameters - Catapres 0.1 q week  Acute metabolic encephalopathy. - Minimize sedation as able - Hold diureses and continue free water for hypernatremia  Volume overload  - Continue to hold lasix - Free water for diuresis induced hypernatremia - Replace electrolytes as indicated - BMET in AM - BMET at 2 PM today - KCl 40 meq IV  Aspiration pneumonia. E coli UTI. - Day 7 of Abx;  Rocephin, D/C 9/17 - Still febrile; check PCT  Hx of HTN. - Coreg - increase dose 03/28/18 - PRN hydralazine - Cardene drip - Catapres patch at 0.1 - Norvasc 10 mg PO with holding parameters   Stage 4 breast cancer s/p mastectomy, XRT to brain met, and recently on chemo. Chemo induced anemia, thrombocytopenia. - Followed by oncology in [place] - F/u CBC  Hyperglycemia. - SSI - CBGs  SUMMARY OF TODAY'S PLAN:  Continue to hold diureses, replace electrolytes, BMET later today, start weaning trials but no extubation  Best Practice / Goals of Care / Disposition.   DVT PROPHYLAXIS: SCDs SUP: protonix NUTRITION: tube feeds MOBILITY: bed rest GOALS OF CARE: full code FAMILY DISCUSSIONS:   Updated family at bedside 9/13. Husband updated 03/27/18, Husband 03/28/18  The patient is critically ill with multiple organ systems failure and requires high complexity decision making for assessment and support, frequent evaluation and titration of therapies, application of advanced monitoring technologies and extensive interpretation of multiple databases.   Critical Care Time devoted to patient care services described in this note is  34  Minutes. This time reflects time of care of this signee Dr Jennet Maduro. This critical care time does not reflect procedure time, or teaching time or supervisory time of PA/NP/Med student/Med Resident etc but could involve care discussion time.  Rush Farmer, M.D. [facility] Pulmonary/Critical Care Medicine. Pager: [phone]. After hours pager: [phone].  03/31/2018 8:49 AM

## 2018-03-31 NOTE — Progress Notes (Signed)
  NEUROSURGERY PROGRESS NOTE   No issues overnight.   EXAM:  BP 137/62   Pulse 77   Temp 100 F (37.8 C)   Resp (!) 25   Ht 5\' 4"  (1.626 m)   Wt 102 kg   LMP 08/14/2010 (Exact Date)   SpO2 100%   BMI 38.60 kg/m   On low-dose versed, fentanyl Opens eyes to voice Breathing over vent Follows commands intermittently BUE/BLE  IMPRESSION:  62 y.o. female SAH d# 8, appears to be at neurologic baseline VDRF  PLAN: - Cont to wean sedation as tolerated - TCD today - Cont Nimotop

## 2018-04-01 DIAGNOSIS — R601 Generalized edema: Secondary | ICD-10-CM

## 2018-04-01 LAB — CBC
HEMATOCRIT: 26.5 % — AB (ref 36.0–46.0)
Hemoglobin: 7.8 g/dL — ABNORMAL LOW (ref 12.0–15.0)
MCH: 30.5 pg (ref 26.0–34.0)
MCHC: 29.4 g/dL — ABNORMAL LOW (ref 30.0–36.0)
MCV: 103.5 fL — AB (ref 78.0–100.0)
PLATELETS: 147 10*3/uL — AB (ref 150–400)
RBC: 2.56 MIL/uL — AB (ref 3.87–5.11)
RDW: 21 % — AB (ref 11.5–15.5)
WBC: 8.5 10*3/uL (ref 4.0–10.5)

## 2018-04-01 LAB — BASIC METABOLIC PANEL
Anion gap: 10 (ref 5–15)
BUN: 34 mg/dL — AB (ref 6–20)
CHLORIDE: 117 mmol/L — AB (ref 98–111)
CO2: 28 mmol/L (ref 22–32)
CREATININE: 0.63 mg/dL (ref 0.44–1.00)
Calcium: 8.9 mg/dL (ref 8.9–10.3)
GFR calc Af Amer: >60 mL/min (ref >60)
GFR calc non Af Amer: >60 mL/min (ref >60)
Glucose, Bld: 157 mg/dL — ABNORMAL HIGH (ref 70–99)
POTASSIUM: 3.4 mmol/L — AB (ref 3.5–5.1)
SODIUM: 155 mmol/L — AB (ref 135–145)

## 2018-04-01 LAB — GLUCOSE, CAPILLARY
GLUCOSE-CAPILLARY: 131 mg/dL — AB (ref 70–99)
GLUCOSE-CAPILLARY: 132 mg/dL — AB (ref 70–99)
GLUCOSE-CAPILLARY: 139 mg/dL — AB (ref 70–99)
GLUCOSE-CAPILLARY: 168 mg/dL — AB (ref 70–99)
Glucose-Capillary: 164 mg/dL — ABNORMAL HIGH (ref 70–99)
Glucose-Capillary: 134 mg/dL — ABNORMAL HIGH (ref 70–99)

## 2018-04-01 LAB — PHOSPHORUS: Phosphorus: 3.2 mg/dL (ref 2.5–4.6)

## 2018-04-01 LAB — MAGNESIUM: Magnesium: 2.2 mg/dL (ref 1.7–2.4)

## 2018-04-01 MED ORDER — POTASSIUM CHLORIDE 20 MEQ/15ML (10%) PO SOLN
40.0000 meq | Freq: Four times a day (QID) | ORAL | Status: AC
  Administered 2018-04-01 (×3): 40 meq
  Filled 2018-04-01 (×3): qty 30

## 2018-04-01 MED ORDER — AMLODIPINE BESYLATE 10 MG PO TABS
10.0000 mg | ORAL_TABLET | Freq: Every day | ORAL | Status: DC
  Administered 2018-04-01 – 2018-04-05 (×4): 10 mg
  Filled 2018-04-01 (×4): qty 1

## 2018-04-01 MED ORDER — LABETALOL HCL 100 MG PO TABS
100.0000 mg | ORAL_TABLET | Freq: Three times a day (TID) | ORAL | Status: DC
  Administered 2018-04-01 – 2018-04-05 (×8): 100 mg
  Filled 2018-04-01 (×9): qty 1

## 2018-04-01 MED ORDER — METOLAZONE 5 MG PO TABS
5.0000 mg | ORAL_TABLET | Freq: Every day | ORAL | Status: AC
  Administered 2018-04-01: 5 mg via ORAL
  Filled 2018-04-01: qty 1

## 2018-04-01 MED ORDER — LABETALOL HCL 100 MG PO TABS
100.0000 mg | ORAL_TABLET | Freq: Three times a day (TID) | ORAL | Status: DC
  Filled 2018-04-01: qty 1

## 2018-04-01 MED ORDER — FUROSEMIDE 10 MG/ML IJ SOLN
8.0000 mg/h | INTRAVENOUS | Status: AC
  Administered 2018-04-01: 10 mg/h via INTRAVENOUS
  Filled 2018-04-01: qty 25
  Filled 2018-04-01: qty 20

## 2018-04-01 MED ORDER — DEXTROSE 5 % IV SOLN
INTRAVENOUS | Status: DC
  Administered 2018-04-01 – 2018-04-02 (×3): via INTRAVENOUS

## 2018-04-01 MED ORDER — CLONIDINE HCL 0.2 MG/24HR TD PTWK: 0.2000 mg | MEDICATED_PATCH | TRANSDERMAL | Status: DC

## 2018-04-01 NOTE — Progress Notes (Signed)
PULMONARY / CRITICAL CARE MEDICINE   NAME:  Diane Short, MRN:  [id number], DOB:  10-Apr-1956, LOS: 35 ADMISSION DATE:  03/22/2018, CONSULTATION DATE: 03/22/2018 REFERRING MD:  Dr. Leonette Monarch, ER, CHIEF COMPLAINT:  Headache  BRIEF HISTORY:    62 yo female smoker with hx of cerebral aneurysms s/p intervention presented with persistent headache, and altered mental status.  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  Intubated for airway protection.  Nimodipine started.  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  SIGNIFICANT PAST MEDICAL HISTORY   Stage 4 Breast cancer, Neuropathy, HTN, Hiatal hernia, GERD, Depression  SIGNIFICANT EVENTS:  9/09 Admit 9/10 cerebral angiogram with coiling 9/11 extubated >> reintubated due to stridor and start steroids; start ABx for UTI, aspiration 9/13 wheezing, respiratory acidosis >> paralytic x 1, deep sedation. STarted on nimbex and solumedrol 9/14-  on nimbex since yesterday due to vent dynschorny. On fent, versed and diprivan. BIS 40-60. Pulse ox 89%, 45% fio2, peep - 5, On Cardene  For goal sbp < 160. Currently 135. On tube feeds. Lot of 3rd spacing.  +. Rt hand bruising - worse per RN but pulse rt radial, rt ulnar and cap refill on right hand. Started lasix 9/16 remains paralyzed, diuresed, attempt to get paralytics off today 9/17 diuresed very well overnight but Na increased, tolerated nimbex off, minimizing sedation and holding diureses for today, decrease PEEP and FiO2 to 5 and 30%.  STUDIES:   CT head 9/09 >> SAH, mild hydrocephalus EEG 9/09 >> slowing CT head 9/10 >> stable SAH and IVH Echo 9/10 >> left ventricular EF 55 to 60%.  Grade 1 diastolic dysfunction.  CULTURES:  Urine 9/09 >> E coli Sputum 9/09 >> oral flora Blood 9/09 >>   ANTIBIOTICS:  Zosyn 9/11 >> 9/13 Rocephin 9/13 >> 9/17  LINES/TUBES:  Rt port >> ETT 9/09 >> 9/11 ETT 9/11 >> Rt radial aline 9/13 >>   CONSULTANTS:  Neurosurgery 9/09  >>  SUBJECTIVE:  Weaning on 10/5 this AM, no events, no new complaints  CONSTITUTIONAL: BP (!) 189/74   Pulse (!) 105   Temp 99.7 F (37.6 C)   Resp (!) 25   Ht '5\' 4"'  (1.626 m)   Wt 102 kg   LMP 08/14/2010 (Exact Date)   SpO2 100%   BMI 38.60 kg/m   I/O last 3 completed shifts: In: 5172.7 [I.V.:1772.7; NG/GT:3250; IV Piggyback:150] Out: 3151 [Urine:3994; Stool:1580]   Vent Mode: PSV;CPAP FiO2 (%):  [30 %] 30 % Set Rate:  [20 bmp] 20 bmp Vt Set:  [440 mL] 440 mL PEEP:  [5 cmH20] 5 cmH20 Pressure Support:  [10 cmH20] 10 cmH20 Plateau Pressure:  [14 cmH20-15 cmH20] 15 cmH20  PHYSICAL EXAM: General Appearance:  Critically ill appearing, NAD Head:  [place]/AT Eyes:  PERRL, EOM-I Ears:  WNL Throat:  ETT and OGT in place Neck:  Supple, -JVD and -thyromegally Lungs: Decreased BS diffusely Heart:  RRR, Nl S1/S2 and -M/R/G Abdomen:  Soft, NT, ND and +BS Extremities:  Flank edema significant Skin:  Intact Neurologic:  Opens eyes, gags, not following commands, off paralytics  LABS   PULMONARY Recent Labs  Lab 03/26/18 0743  03/27/18 1822 03/28/18 0315 03/29/18 0927 03/30/18 0403 03/31/18 0338  PHART 7.248*   < > 7.290* 7.260* 7.440 7.449 7.517*  PCO2ART 54.1*   < > 51.3* 58.1* 42.7 46.5 36.7  PO2ART 100.0   < > 81.8* 104.0 69.0* 81.0* 71.0*  HCO3 23.6   < >  24.0 25.2 29.0* 32.2* 29.7*  TCO2 25  --   --  27 30 34* 31  O2SAT 96.0   < > 95.6 95.0 94.0 96.0 96.0   < > = values in this interval not displayed.   CBC Recent Labs  Lab 03/30/18 0341 03/31/18 0325 04/01/18 0435  HGB 8.4* 8.0* 7.8*  HCT 27.6* 26.4* 26.5*  WBC 11.6* 8.7 8.5  PLT 121* 135* 147*   COAGULATION No results for input(s): INR in the last 168 hours. CARDIAC  No results for input(s): TROPONINI in the last 168 hours. No results for input(s): PROBNP in the last 168 hours.  CHEMISTRY Recent Labs  Lab 03/28/18 0433  03/29/18 0443 03/30/18 0341 03/31/18 0325 03/31/18 1542 03/31/18 2023  04/01/18 0435  NA  --    < >  --  160* 156* 157* 156* 155*  K  --    < >  --  3.4* 2.8* 3.7 3.8 3.4*  CL  --    < >  --  114* 116* 119* 119* 117*  CO2  --    < >  --  '28 29 29 28 28  ' GLUCOSE  --    < >  --  165* 155* 131* 155* 157*  BUN  --    < >  --  68* 56* 49* 42* 34*  CREATININE  --    < >  --  0.97 0.81 0.75 0.69 0.63  CALCIUM  --    < >  --  9.2 9.0 9.0 9.0 8.9  MG 2.3  --  2.3 2.4 2.3  --   --  2.2  PHOS 4.1  --  4.4 6.0* 4.6  --   --  3.2   < > = values in this interval not displayed.   Estimated Creatinine Clearance: 86.9 mL/min (by C-G formula based on SCr of 0.63 mg/dL).  LIVER Recent Labs  Lab 03/28/18 0433  AST 106*  ALT 93*  ALKPHOS 100  BILITOT 0.8  PROT 5.4*  ALBUMIN 2.5*   INFECTIOUS Recent Labs  Lab 03/28/18 0229 03/28/18 0433 03/29/18 0443 03/30/18 0341  LATICACIDVEN 1.7  --   --   --   PROCALCITON  --  0.27 0.39 0.46   ENDOCRINE CBG (last 3)  Recent Labs    03/31/18 2306 04/01/18 0328 04/01/18 0755  GLUCAP 107* 131* 132*   IMAGING x48h  - image(s) personally visualized  -   highlighted in bold Dg Chest Port 1 View  Result Date: 03/31/2018 CLINICAL DATA:  Check endotracheal tube placement EXAM: PORTABLE CHEST 1 VIEW COMPARISON:  03/30/2018 FINDINGS: Endotracheal tube, nasogastric catheter and right-sided PICC line are noted in satisfactory position. Previously placed right-sided chest wall port is noted and stable. Small bilateral effusions are again noted and stable. Some slight increased density in the left retrocardiac region is noted likely representing atelectasis. No new focal abnormality is noted. IMPRESSION: Bilateral effusions and left retrocardiac atelectasis. This is stable from the previous day. Tubes and lines as described above. Electronically Signed   By: Inez Catalina M.D.   On: 03/31/2018 10:14    ASSESSMENT AND PLAN   History of tobacco abuse Acute respiratory failure with hypoxia, hypercapnia Postextubation stridor 9/11.-   with acute bronchospasm 9/13 and reintubated. Needing deep sedatin and nimbex and solumedrol since 03/26/18  Plan - Maintain on full support today for comfort - Maintain FiO2 to 30 and PEEP to 5 - Active diureses today - D/Ced solumedrol,  keep off - Wean sedation down as able - Scheduled BD  SAH s/p coiling and embolization of PICA aneurysm 9/10. - Continue Nimotop - Keppra for seizure prophylaxis - Goal SBP <140-160 - cardene drip, minimize as able - Coreg home dose - Norvasc 10 mg PO daily with hold parameters - Increase Catapres 0.2 q week  Acute metabolic encephalopathy. - Minimize sedation as able  Volume overload  - Continue to hold lasix - Free water for diuresis induced hypernatremia - Replace electrolytes as indicated - BMET in AM - Lasix 10 mg/hr hour drip - Zaroxolyn 5 mg PO saily  - D5W 75 ml/hr x24 hours - KCl ordered.  Aspiration pneumonia. E coli UTI. - Day 7 of Abx;  Rocephin, D/C 9/17 - Still febrile; check PCT  Hx of HTN. - Coreg - increase dose 03/28/18 - PRN hydralazine - Cardene drip - Catapres patch at 0.2 - Norvasc 10 mg PO with holding parameters   Stage 4 breast cancer s/p mastectomy, XRT to brain met, and recently on chemo. Chemo induced anemia, thrombocytopenia. - Followed by oncology in [place] - F/u CBC  Hyperglycemia. - SSI - CBGs  SUMMARY OF TODAY'S PLAN:  Family updated bedside, active diureses today, D5W as ordered, BMET in AM and replace K.  Best Practice / Goals of Care / Disposition.   DVT PROPHYLAXIS: SCDs SUP: protonix NUTRITION: tube feeds MOBILITY: bed rest GOALS OF CARE: full code FAMILY DISCUSSIONS:  Updated family at bedside 9/13. Husband updated 03/27/18, Husband 03/28/18  The patient is critically ill with multiple organ systems failure and requires high complexity decision making for assessment and support, frequent evaluation and titration of therapies, application of advanced monitoring technologies and  extensive interpretation of multiple databases.   Critical Care Time devoted to patient care services described in this note is  37  Minutes. This time reflects time of care of this signee Dr Jennet Maduro. This critical care time does not reflect procedure time, or teaching time or supervisory time of PA/NP/Med student/Med Resident etc but could involve care discussion time.  Rush Farmer, M.D. [facility] Pulmonary/Critical Care Medicine. Pager: [phone]. After hours pager: [phone].  04/01/2018 10:11 AM

## 2018-04-01 NOTE — Progress Notes (Signed)
  NEUROSURGERY PROGRESS NOTE   No issues overnight.   EXAM:  BP (!) 146/57   Pulse 83   Temp (!) 100.6 F (38.1 C)   Resp (!) 21   Ht 5\' 4"  (1.626 m)   Wt 102 kg   LMP 08/14/2010 (Exact Date)   SpO2 97%   BMI 38.60 kg/m   On low-dose versed, fentanyl Opens eyes to voice Breathing over vent Follows commands intermittently BUE/BLE  IMPRESSION:  62 y.o. female SAH d# 9, appears to be at neurologic baseline however continues to require sedation for vent VDRF Volume overload  PLAN: - Cont to wean sedation as tolerated - TCD tomorrow - Cont Nimotop - If unable to safely wean to extubate, would plan on trach

## 2018-04-02 ENCOUNTER — Inpatient Hospital Stay (HOSPITAL_COMMUNITY): Payer: Medicaid Other

## 2018-04-02 DIAGNOSIS — G9340 Encephalopathy, unspecified: Secondary | ICD-10-CM

## 2018-04-02 DIAGNOSIS — I609 Nontraumatic subarachnoid hemorrhage, unspecified: Secondary | ICD-10-CM

## 2018-04-02 LAB — GLUCOSE, CAPILLARY
GLUCOSE-CAPILLARY: 151 mg/dL — AB (ref 70–99)
GLUCOSE-CAPILLARY: 176 mg/dL — AB (ref 70–99)
GLUCOSE-CAPILLARY: 177 mg/dL — AB (ref 70–99)
GLUCOSE-CAPILLARY: 154 mg/dL — AB (ref 70–99)
Glucose-Capillary: 123 mg/dL — ABNORMAL HIGH (ref 70–99)
Glucose-Capillary: 188 mg/dL — ABNORMAL HIGH (ref 70–99)

## 2018-04-02 LAB — BASIC METABOLIC PANEL
ANION GAP: 14 (ref 5–15)
ANION GAP: 15 (ref 5–15)
BUN: 33 mg/dL — ABNORMAL HIGH (ref 6–20)
BUN: 35 mg/dL — AB (ref 6–20)
CHLORIDE: 102 mmol/L (ref 98–111)
CHLORIDE: 91 mmol/L — AB (ref 98–111)
CO2: 30 mmol/L (ref 22–32)
CO2: 33 mmol/L — ABNORMAL HIGH (ref 22–32)
Calcium: 8.9 mg/dL (ref 8.9–10.3)
Calcium: 9.1 mg/dL (ref 8.9–10.3)
Creatinine, Ser: 0.84 mg/dL (ref 0.44–1.00)
Creatinine, Ser: 0.96 mg/dL (ref 0.44–1.00)
GFR calc Af Amer: >60 mL/min (ref >60)
GFR calc non Af Amer: >60 mL/min (ref >60)
GLUCOSE: 225 mg/dL — AB (ref 70–99)
Glucose, Bld: 189 mg/dL — ABNORMAL HIGH (ref 70–99)
POTASSIUM: 3.1 mmol/L — AB (ref 3.5–5.1)
POTASSIUM: 3.2 mmol/L — AB (ref 3.5–5.1)
SODIUM: 139 mmol/L (ref 135–145)
Sodium: 146 mmol/L — ABNORMAL HIGH (ref 135–145)

## 2018-04-02 LAB — BLOOD GAS, ARTERIAL
Acid-Base Excess: 9.8 mmol/L — ABNORMAL HIGH (ref 0.0–2.0)
BICARBONATE: 33.4 mmol/L — AB (ref 20.0–28.0)
FIO2: 30
MECHVT: 440 mL
O2 SAT: 96.8 %
PATIENT TEMPERATURE: 98.6
PCO2 ART: 40.9 mmHg (ref 32.0–48.0)
PEEP/CPAP: 5 cmH2O
PH ART: 7.522 — AB (ref 7.350–7.450)
RATE: 20 resp/min
pO2, Arterial: 79.4 mmHg — ABNORMAL LOW (ref 83.0–108.0)

## 2018-04-02 LAB — MAGNESIUM: Magnesium: 1.6 mg/dL — ABNORMAL LOW (ref 1.7–2.4)

## 2018-04-02 LAB — CBC
HEMATOCRIT: 25.4 % — AB (ref 36.0–46.0)
Hemoglobin: 7.6 g/dL — ABNORMAL LOW (ref 12.0–15.0)
MCH: 30.8 pg (ref 26.0–34.0)
MCHC: 29.9 g/dL — ABNORMAL LOW (ref 30.0–36.0)
MCV: 102.8 fL — AB (ref 78.0–100.0)
Platelets: 172 10*3/uL (ref 150–400)
RBC: 2.47 MIL/uL — AB (ref 3.87–5.11)
RDW: 20.3 % — ABNORMAL HIGH (ref 11.5–15.5)
WBC: 8.7 10*3/uL (ref 4.0–10.5)

## 2018-04-02 LAB — PHOSPHORUS: Phosphorus: 3.6 mg/dL (ref 2.5–4.6)

## 2018-04-02 LAB — PROCALCITONIN: Procalcitonin: 0.26 ng/mL

## 2018-04-02 MED ORDER — POTASSIUM CHLORIDE 20 MEQ/15ML (10%) PO SOLN
40.0000 meq | Freq: Two times a day (BID) | ORAL | Status: AC
  Administered 2018-04-02 (×2): 40 meq via ORAL
  Filled 2018-04-02: qty 30

## 2018-04-02 MED ORDER — ACETAZOLAMIDE SODIUM 500 MG IJ SOLR
250.0000 mg | Freq: Four times a day (QID) | INTRAMUSCULAR | Status: AC
  Administered 2018-04-02 (×3): 250 mg via INTRAVENOUS
  Filled 2018-04-02 (×3): qty 250

## 2018-04-02 MED ORDER — STERILE WATER FOR INJECTION IJ SOLN
INTRAMUSCULAR | Status: AC
  Administered 2018-04-02: 5 mL
  Filled 2018-04-02: qty 10

## 2018-04-02 MED ORDER — METOLAZONE 5 MG PO TABS
5.0000 mg | ORAL_TABLET | Freq: Once | ORAL | Status: AC
  Administered 2018-04-02: 5 mg via ORAL
  Filled 2018-04-02: qty 1

## 2018-04-02 MED ORDER — DEXMEDETOMIDINE HCL IN NACL 400 MCG/100ML IV SOLN
0.4000 ug/kg/h | INTRAVENOUS | Status: DC
  Administered 2018-04-02: 0.4 ug/kg/h via INTRAVENOUS
  Administered 2018-04-03: 1.2 ug/kg/h via INTRAVENOUS
  Administered 2018-04-03 (×2): 0.4 ug/kg/h via INTRAVENOUS
  Administered 2018-04-04: 1 ug/kg/h via INTRAVENOUS
  Administered 2018-04-04: 0.8 ug/kg/h via INTRAVENOUS
  Filled 2018-04-02 (×6): qty 100

## 2018-04-02 MED ORDER — MAGNESIUM SULFATE 2 GM/50ML IV SOLN
2.0000 g | Freq: Once | INTRAVENOUS | Status: AC
  Administered 2018-04-02: 2 g via INTRAVENOUS
  Filled 2018-04-02: qty 50

## 2018-04-02 MED ORDER — FUROSEMIDE 10 MG/ML IJ SOLN
8.0000 mg/h | INTRAVENOUS | Status: DC
  Administered 2018-04-02: 8 mg/h via INTRAVENOUS
  Filled 2018-04-02: qty 21

## 2018-04-02 MED ORDER — POTASSIUM CHLORIDE 20 MEQ/15ML (10%) PO SOLN
30.0000 meq | ORAL | Status: AC
  Administered 2018-04-02: 30 meq
  Filled 2018-04-02 (×2): qty 30

## 2018-04-02 NOTE — Progress Notes (Addendum)
PULMONARY / CRITICAL CARE MEDICINE   NAME:  Diane Short, MRN:  [id number], DOB:  22-Sep-1955, LOS: 14 ADMISSION DATE:  03/22/2018, CONSULTATION DATE: 03/22/2018 REFERRING MD:  Dr. Leonette Monarch, ER, CHIEF COMPLAINT:  Headache  BRIEF HISTORY:    62 yo female smoker with hx of cerebral aneurysms s/p intervention presented with persistent headache, and altered mental status.  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  Intubated for airway protection.  Nimodipine started.  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  SIGNIFICANT PAST MEDICAL HISTORY   Stage 4 Breast cancer, Neuropathy, HTN, Hiatal hernia, GERD, Depression  SIGNIFICANT EVENTS:  9/09 Admit 9/10 cerebral angiogram with coiling 9/11 extubated >> reintubated due to stridor and start steroids; start ABx for UTI, aspiration 9/13 wheezing, respiratory acidosis >> paralytic x 1, deep sedation. STarted on nimbex and solumedrol 9/14-  on nimbex since yesterday due to vent dynschorny. On fent, versed and diprivan. BIS 40-60. Pulse ox 89%, 45% fio2, peep - 5, On Cardene  For goal sbp < 160. Currently 135. On tube feeds. Lot of 3rd spacing.  +. Rt hand bruising - worse per RN but pulse rt radial, rt ulnar and cap refill on right hand. Started lasix 9/16 remains paralyzed, diuresed, attempt to get paralytics off today 9/17 diuresed very well overnight but Na increased, tolerated nimbex off, minimizing sedation and holding diureses for today, decrease PEEP and FiO2 to 5 and 30%. 9/20 diuresed well and beginning active weaning  STUDIES:   CT head 9/09 >> SAH, mild hydrocephalus EEG 9/09 >> slowing CT head 9/10 >> stable SAH and IVH Echo 9/10 >> left ventricular EF 55 to 60%.  Grade 1 diastolic dysfunction.  CULTURES:  Urine 9/09 >> E coli Sputum 9/09 >> oral flora Blood 9/09 >> No growth  ANTIBIOTICS:  Zosyn 9/11 >> 9/13 Rocephin 9/13 >> 9/17  LINES/TUBES:  Rt port >> ETT 9/09 >> 9/11 ETT 9/11 >> Rt radial  aline 9/13 >>   CONSULTANTS:  Neurosurgery 9/09 >>  SUBJECTIVE:  Weaned on 10/5 x 3 hours 9/19, Now weaning 10/5 with no distress  CONSTITUTIONAL: BP 123/61   Pulse 82   Temp 99.1 F (37.3 C)   Resp 19   Ht '5\' 4"'  (1.626 m)   Wt 102 kg   LMP 08/14/2010 (Exact Date)   SpO2 97%   BMI 38.60 kg/m   I/O last 3 completed shifts: In: 6141.8 [I.V.:2591.8; NG/GT:3550] Out: 19147 [Urine:9384; Stool:630]   Vent Mode: PRVC FiO2 (%):  [30 %] 30 % Set Rate:  [20 bmp] 20 bmp Vt Set:  [440 mL] 440 mL PEEP:  [5 cmH20] 5 cmH20 Plateau Pressure:  [12 cmH20-19 cmH20] 17 cmH20  PHYSICAL EXAM: General Appearance:  Supine in bed, weaning on CPAP/ PS, NAD ENT: NCAT, Oral ETT, OG tube,No JVD or LAD noted, No thyromegally Lungs: Bilateral chest excursion, Coarse throughout, diminished per bases Heart:  S1, S2, RRR, No RMG Abdomen:  Soft, NT, ND and +BS, Body mass index is 38.6 kg/m. Extremities:  1+ edema, otherwise no obvious abnormalities Skin:  Intact, warm and dry without mottling Neurologic:  Opens eyes, gags, not following commands, off paralytics  LABS   PULMONARY Recent Labs  Lab 03/28/18 0315 03/29/18 0927 03/30/18 0403 03/31/18 0338 04/02/18 0355  PHART 7.260* 7.440 7.449 7.517* 7.522*  PCO2ART 58.1* 42.7 46.5 36.7 40.9  PO2ART 104.0 69.0* 81.0* 71.0* 79.4*  HCO3 25.2 29.0* 32.2* 29.7* 33.4*  TCO2 27  30 34* 31  --   O2SAT 95.0 94.0 96.0 96.0 96.8   CBC Recent Labs  Lab 03/31/18 0325 04/01/18 0435 04/02/18 0500  HGB 8.0* 7.8* 7.6*  HCT 26.4* 26.5* 25.4*  WBC 8.7 8.5 8.7  PLT 135* 147* 172   COAGULATION No results for input(s): INR in the last 168 hours. CARDIAC  No results for input(s): TROPONINI in the last 168 hours. No results for input(s): PROBNP in the last 168 hours.  CHEMISTRY Recent Labs  Lab 03/29/18 0443 03/30/18 0341 03/31/18 0325 03/31/18 1542 03/31/18 2023 04/01/18 0435 04/02/18 0500  NA  --  160* 156* 157* 156* 155* 146*  K  --  3.4*  2.8* 3.7 3.8 3.4* 3.1*  CL  --  114* 116* 119* 119* 117* 102  CO2  --  '28 29 29 28 28 30  ' GLUCOSE  --  165* 155* 131* 155* 157* 225*  BUN  --  68* 56* 49* 42* 34* 33*  CREATININE  --  0.97 0.81 0.75 0.69 0.63 0.84  CALCIUM  --  9.2 9.0 9.0 9.0 8.9 8.9  MG 2.3 2.4 2.3  --   --  2.2 1.6*  PHOS 4.4 6.0* 4.6  --   --  3.2 3.6   Estimated Creatinine Clearance: 82.8 mL/min (by C-G formula based on SCr of 0.84 mg/dL).  LIVER Recent Labs  Lab 03/28/18 0433  AST 106*  ALT 93*  ALKPHOS 100  BILITOT 0.8  PROT 5.4*  ALBUMIN 2.5*   INFECTIOUS Recent Labs  Lab 03/28/18 0229 03/28/18 0433 03/29/18 0443 03/30/18 0341  LATICACIDVEN 1.7  --   --   --   PROCALCITON  --  0.27 0.39 0.46   ENDOCRINE CBG (last 3)  Recent Labs    04/01/18 2355 04/02/18 0349 04/02/18 0728  GLUCAP 139* 151* 176*   IMAGING x48h  - image(s) personally visualized  -   highlighted in bold Dg Chest Port 1 View  Result Date: 04/02/2018 CLINICAL DATA:  Check endotracheal tube placement EXAM: PORTABLE CHEST 1 VIEW COMPARISON:  03/31/2018 FINDINGS: Cardiac shadow is mildly accentuated by the portable technique. Endotracheal tube, nasogastric catheter, right-sided chest wall port and right-sided PICC line are noted in satisfactory position. Right-sided pleural effusion is again seen and stable. Left-sided effusion has improved which may be positional in nature. Stable left retrocardiac atelectasis is noted. IMPRESSION: Tubes and lines as described. Stable left basilar atelectasis and right-sided pleural effusion. The left effusion appears improved although this may be positional in nature. Electronically Signed   By: Inez Catalina M.D.   On: 04/02/2018 07:23    ASSESSMENT AND PLAN   History of tobacco abuse Acute respiratory failure with hypoxia, hypercapnia Postextubation stridor 9/11.-  with acute bronchospasm 9/13 and reintubated. 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 - Continue Nimotop - Keppra for seizure prophylaxis - Goal SBP <140-160 - cardene drip, minimize as able - Coreg home dose - Norvasc 10 mg PO daily with hold parameters -  Increase Catapres 0.2 q week  Acute metabolic encephalopathy. - Minimize sedation as able  Volume overload >> improving - Lasix gtt to 8 mg per hour - Free water for diuresis induced hypernatremia>> decrease to 50 cc per hour - Replace electrolytes as indicated - BMET in AM - Lasix 10 mg/hr hour drip - Zaroxolyn 5 mg PO saily  - D5W 75 ml/hr x24 hours - KCl ordered.  Aspiration pneumonia. Right-sided pleural effusion is again seen and stable. Left-sided effusion has improved which may be positional in nature. Stable left retrocardiac atelectasis is noted. T Max 101.3 last 24 PCT 0.46 WBC 8.7 E coli UTI. - Completed  Rocephin on  9/17 Plan: - Pan Culture blood, sputum, urine - Trend PCT first now - PCT to guide ABX treatment   Hx of HTN. - Coreg - increase dose 03/28/18 - PRN hydralazine - Cardene drip off for now - Catapres patch at 0.2 - Norvasc 10 mg PO with holding parameters   Stage 4 breast cancer s/p mastectomy, XRT to brain met, and recently on chemo. Chemo induced anemia, thrombocytopenia. - Followed by oncology in [place] - F/u CBC  Hyperglycemia. - SSI - CBGs  SUMMARY OF TODAY'S PLAN:  Family updated bedside, continue active diureses today, D5W as ordered, BMET at 2000 , repletion of potassium, dosing zaroxylyn and diamox for contraction alkalosis. Pan culture for temp.  Best  Practice / Goals of Care / Disposition.   DVT PROPHYLAXIS: SCDs SUP: protonix NUTRITION: tube feeds MOBILITY: bed rest GOALS OF CARE: full code FAMILY DISCUSSIONS:  Updated family at bedside 9/13. Husband updated 03/27/18, Husband 03/28/18, Husband 9/20   Magdalen Spatz, AGACNP-BC [place] Pager # [phone]  04/02/2018 9:05 AM   Attending Note:  62 year old female with history of breast cancer presenting with a CVA.  The patient has done well overnight, diuresed very well and Na is improving.  On exam, she opens her eyes but not following commands.  I reviewed CXR myself, ETT is in a good position.  Discussed with PCCM-NP.  Will decrease lasix to 8 mg/hr drip, give zaroxolyn 5 mg x1 dose, diamox 250 q6 x3 doses.  Replace K.  Keep on 5/5.  Hold all sedation.  Will re-evaluate for extubation later on today.  If agitated will start precedex.  The patient is critically ill with multiple organ systems failure and requires high complexity decision making for assessment and support, frequent evaluation and titration of therapies, application of advanced monitoring technologies and extensive interpretation of multiple databases.   Critical Care Time devoted to patient care services described in this note is  34  Minutes. This time reflects time of care of this signee Dr Jennet Maduro. This critical care time does not reflect procedure time, or teaching time or supervisory time of PA/NP/Med student/Med Resident etc but could involve care discussion time.  Rush Farmer, M.D. [facility] Pulmonary/Critical Care Medicine. Pager: [phone]. After hours pager: [phone].

## 2018-04-02 NOTE — Progress Notes (Signed)
[facility] ADULT ICU REPLACEMENT PROTOCOL FOR AM LAB REPLACEMENT ONLY  The patient does apply for the [facility] Adult ICU Electrolyte Replacment Protocol based on the criteria listed below:   1. Is GFR >/= 40 ml/min? Yes.    Patient's GFR today is >60 2. Is urine output >/= 0.5 ml/kg/hr for the last 6 hours? Yes.   Patient's UOP is 2.1 ml/kg/hr 3. Is BUN < 60 mg/dL? Yes.    Patient's BUN today is 33 4. Abnormal electrolyte(s) K-3.1 5. Ordered repletion with: per protocol 6. If a panic level lab has been reported, has the CCM MD in charge been notified? Yes.  .   Physician:  Dr. Cleon Gustin, Philis Nettle 04/02/2018 6:41 AM

## 2018-04-02 NOTE — Progress Notes (Signed)
Sputum sample sent to lab per MD order 

## 2018-04-02 NOTE — Progress Notes (Addendum)
  NEUROSURGERY PROGRESS NOTE   No issues overnight.   EXAM:  BP (!) 129/55   Pulse 90   Temp 100.2 F (37.9 C)   Resp (!) 33   Ht 5\' 4"  (1.626 m)   Wt 102 kg   LMP 08/14/2010 (Exact Date)   SpO2 94%   BMI 38.60 kg/m   On low-dose versed, fentanyl Opens eyes to voice Breathing over vent W/d BUE/BLE   IMPRESSION:  62 y.o. female SAH d# 10, Unable to accurately follow exam due to sedation requirements for vent VDRF Volume overload  PLAN: - Cont to wean sedation as tolerated - TCD today - Cont Nimotop - If unable to safely wean to extubate, would plan on trach

## 2018-04-02 NOTE — Progress Notes (Signed)
30cc of fentanyl gtt and 3cc of versed gtt wasted with Avanell Shackleton, RN in the sink.

## 2018-04-02 NOTE — Progress Notes (Addendum)
Patients SBP on arterial line in the 170s-180s. Dr Kathyrn Sheriff notified. No interventions needed per Dr Kathyrn Sheriff.

## 2018-04-02 NOTE — Progress Notes (Signed)
Transcranial Doppler  Date POD PCO2 HCT BP  MCA ACA PCA OPHT SIPH VERT Basilar  03/24/18 vs     Right  Left   92  13   -58  -52   19  *   28  *   137  *   93  *   90      9/13 MS     Right  Left   124  34   -70  *   -39  *   *  12   *  -26   -36  -53   -61      9/16 MS     Right  Left   64  17   -51  *   -24  *   22  14   42  42   -30  -30     -30    9/18 JE      Right  Left   47  *   -31  *   16  *   41  18   49  34   *  *   *      9/20 GC      Right  Left   73  *   -30  *   52  *   23  13   68  85   *  *   *           Right  Left                                            Right  Left                                        MCA = Middle Cerebral Artery      OPHT = Opthalmic Artery     BASILAR = Basilar Artery   ACA = Anterior Cerebral Artery     SIPH = Carotid Siphon PCA = Posterior Cerebral Artery   VERT = Verterbral Artery                   Normal MCA = 62+\-12 ACA = 50+\-12 PCA = 42+\-23   03/24/2018 * Not found Lindegaard ratio - Rt 0.99 Lt 0.12  03/26/18 *Unable to insonate Lindegaard ratio- Rt 2.95 Lt 0.55  03/29/18 *Unable to insonate Lindegaard ratio- Rt 2.29 Lt 0.40  9/18 * unable to insonate. Technically difficult due to patient constantly moving head. je  9/20 * Unable to insonate. Technically difficult due to patient movement. GC Right Lindegaard ratio 1.71  04/02/2018 11:33 AM

## 2018-04-03 ENCOUNTER — Inpatient Hospital Stay (HOSPITAL_COMMUNITY): Payer: Medicaid Other

## 2018-04-03 LAB — COMPREHENSIVE METABOLIC PANEL
ALBUMIN: 2.6 g/dL — AB (ref 3.5–5.0)
ALK PHOS: 85 U/L (ref 38–126)
ALT: 40 U/L (ref 0–44)
AST: 29 U/L (ref 15–41)
Anion gap: 17 — ABNORMAL HIGH (ref 5–15)
BUN: 35 mg/dL — ABNORMAL HIGH (ref 6–20)
CALCIUM: 9.1 mg/dL (ref 8.9–10.3)
CO2: 34 mmol/L — AB (ref 22–32)
CREATININE: 0.94 mg/dL (ref 0.44–1.00)
Chloride: 85 mmol/L — ABNORMAL LOW (ref 98–111)
GFR calc Af Amer: >60 mL/min (ref >60)
GFR calc non Af Amer: >60 mL/min (ref >60)
GLUCOSE: 212 mg/dL — AB (ref 70–99)
Potassium: 2.8 mmol/L — ABNORMAL LOW (ref 3.5–5.1)
Sodium: 136 mmol/L (ref 135–145)
Total Bilirubin: 0.7 mg/dL (ref 0.3–1.2)
Total Protein: 6.2 g/dL — ABNORMAL LOW (ref 6.5–8.1)

## 2018-04-03 LAB — GLUCOSE, CAPILLARY
GLUCOSE-CAPILLARY: 215 mg/dL — AB (ref 70–99)
GLUCOSE-CAPILLARY: 139 mg/dL — AB (ref 70–99)
GLUCOSE-CAPILLARY: 132 mg/dL — AB (ref 70–99)
Glucose-Capillary: 98 mg/dL (ref 70–99)
Glucose-Capillary: 128 mg/dL — ABNORMAL HIGH (ref 70–99)
Glucose-Capillary: 109 mg/dL — ABNORMAL HIGH (ref 70–99)

## 2018-04-03 LAB — URINE CULTURE: CULTURE: NO GROWTH

## 2018-04-03 LAB — POCT I-STAT 3, ART BLOOD GAS (G3+)
ACID-BASE EXCESS: 12 mmol/L — AB (ref 0.0–2.0)
Bicarbonate: 35.4 mmol/L — ABNORMAL HIGH (ref 20.0–28.0)
O2 Saturation: 98 %
PCO2 ART: 40.3 mmHg (ref 32.0–48.0)
PH ART: 7.552 — AB (ref 7.350–7.450)
PO2 ART: 94 mmHg (ref 83.0–108.0)
Patient temperature: 98.6
TCO2: 37 mmol/L — ABNORMAL HIGH (ref 22–32)

## 2018-04-03 LAB — CBC
HEMATOCRIT: 26 % — AB (ref 36.0–46.0)
HEMOGLOBIN: 8.2 g/dL — AB (ref 12.0–15.0)
MCH: 30.7 pg (ref 26.0–34.0)
MCHC: 31.5 g/dL (ref 30.0–36.0)
MCV: 97.4 fL (ref 78.0–100.0)
Platelets: 186 10*3/uL (ref 150–400)
RBC: 2.67 MIL/uL — AB (ref 3.87–5.11)
RDW: 18.9 % — ABNORMAL HIGH (ref 11.5–15.5)
WBC: 11 10*3/uL — ABNORMAL HIGH (ref 4.0–10.5)

## 2018-04-03 LAB — PROCALCITONIN: Procalcitonin: 0.36 ng/mL

## 2018-04-03 LAB — BASIC METABOLIC PANEL
ANION GAP: 15 (ref 5–15)
BUN: 33 mg/dL — AB (ref 6–20)
CO2: 33 mmol/L — AB (ref 22–32)
Calcium: 9 mg/dL (ref 8.9–10.3)
Chloride: 91 mmol/L — ABNORMAL LOW (ref 98–111)
Creatinine, Ser: 0.84 mg/dL (ref 0.44–1.00)
GFR calc Af Amer: >60 mL/min (ref >60)
GFR calc non Af Amer: >60 mL/min (ref >60)
GLUCOSE: 134 mg/dL — AB (ref 70–99)
POTASSIUM: 2.3 mmol/L — AB (ref 3.5–5.1)
Sodium: 139 mmol/L (ref 135–145)

## 2018-04-03 LAB — MAGNESIUM: Magnesium: 2 mg/dL (ref 1.7–2.4)

## 2018-04-03 LAB — PHOSPHORUS: Phosphorus: 5.4 mg/dL — ABNORMAL HIGH (ref 2.5–4.6)

## 2018-04-03 MED ORDER — POTASSIUM CHLORIDE 20 MEQ/15ML (10%) PO SOLN
40.0000 meq | Freq: Three times a day (TID) | ORAL | Status: AC
  Administered 2018-04-03: 40 meq via ORAL
  Filled 2018-04-03 (×2): qty 30

## 2018-04-03 MED ORDER — CLONIDINE HCL 0.2 MG/24HR TD PTWK
0.2000 mg | MEDICATED_PATCH | TRANSDERMAL | Status: DC
  Administered 2018-04-07: 0.2 mg via TRANSDERMAL
  Filled 2018-04-03: qty 1

## 2018-04-03 MED ORDER — POTASSIUM CHLORIDE 10 MEQ/50ML IV SOLN
10.0000 meq | INTRAVENOUS | Status: AC
  Administered 2018-04-04 (×6): 10 meq via INTRAVENOUS
  Filled 2018-04-03 (×6): qty 50

## 2018-04-03 MED ORDER — FAMOTIDINE IN NACL 20-0.9 MG/50ML-% IV SOLN
20.0000 mg | Freq: Two times a day (BID) | INTRAVENOUS | Status: DC
  Administered 2018-04-04 – 2018-04-05 (×5): 20 mg via INTRAVENOUS
  Filled 2018-04-03 (×6): qty 50

## 2018-04-03 NOTE — Progress Notes (Signed)
[place] Progress Note Patient Name: Diane Short DOB: [birth year] MRN: [id number]   Date of Service  04/03/2018  HPI/Events of Note  K+ = 2.3 and Creatinine = 0.84.   eICU Interventions  Will replace K+.      Intervention Category Major Interventions: Electrolyte abnormality - evaluation and management  Sommer,Steven Eugene 04/03/2018, 11:35 PM

## 2018-04-03 NOTE — Procedures (Signed)
Extubation Procedure Note  Patient Details:   Name: Diane Short DOB: Aug 18, 1955 MRN: [id number]   Airway Documentation:    Vent end date: 04/03/18 Vent end time: 1215   Evaluation  O2 sats: stable throughout Complications: No apparent complications Patient did tolerate procedure well. Bilateral Breath Sounds: Diminished   Yes   Pt extubated to 3L [place] per MD order. Positive cuff leak noted prior to extubation. Pt able to speak and has a strong non productive (dry) cough post extubation. Pt encouraged to use [place]auer to clear secretions. No stridor noted. RT will continue to closely monitor pt  Jesse Sans 04/03/2018, 12:20 PM

## 2018-04-03 NOTE — Progress Notes (Signed)
Subjective: Patient reports improving, per family.  Objective: Vital signs in last 24 hours: Temp:  [98.6 F (37 C)-101.3 F (38.5 C)] 98.6 F (37 C) (09/21 0800) Pulse Rate:  [61-109] 66 (09/21 0800) Resp:  [16-33] 21 (09/21 0719) BP: (104-182)/(48-108) 160/72 (09/21 0800) SpO2:  [92 %-100 %] 98 % (09/21 0800) Arterial Line BP: (127-230)/(49-82) 161/62 (09/21 0800) FiO2 (%):  [30 %] 30 % (09/21 0800)  Intake/Output from previous day: 09/20 0701 - 09/21 0700 In: 4070.2 [I.V.:1720.2; NG/GT:2350] Out: 6925 [Urine:6025; Stool:900] Intake/Output this shift: Total I/O In: 202.5 [I.V.:202.5] Out: 815 [Urine:815]  Physical Exam: Opening eyes and following commands on mild sedation.  Lab Results: Recent Labs    04/02/18 0500 04/03/18 0510  WBC 8.7 11.0*  HGB 7.6* 8.2*  HCT 25.4* 26.0*  PLT 172 186   BMET Recent Labs    04/02/18 2143 04/03/18 0510  NA 139 136  K 3.2* 2.8*  CL 91* 85*  CO2 33* 34*  GLUCOSE 189* 212*  BUN 35* 35*  CREATININE 0.96 0.94  CALCIUM 9.1 9.1    Studies/Results: Dg Chest Port 1 View  Result Date: 04/03/2018 CLINICAL DATA:  Respiratory failure EXAM: PORTABLE CHEST 1 VIEW COMPARISON:  04/02/2018 FINDINGS: Endotracheal tube terminates 7.5 cm above the carina. Mild patchy right lower lobe opacity, likely atelectasis, improved. Right pleural effusion is improved. Left lung is essentially clear. No pneumothorax. Right chest power port terminates at the cavoatrial junction. The heart is normal in size. Enteric tube courses into the diaphragm. IMPRESSION: Endotracheal tube terminates 7.5 cm above the carina. Mild right lower lobe opacity, likely atelectasis, improved. Right pleural effusion is improved. Electronically Signed   By: Julian Hy M.D.   On: 04/03/2018 07:08   Dg Chest Port 1 View  Result Date: 04/02/2018 CLINICAL DATA:  Check endotracheal tube placement EXAM: PORTABLE CHEST 1 VIEW COMPARISON:  03/31/2018 FINDINGS: Cardiac shadow is  mildly accentuated by the portable technique. Endotracheal tube, nasogastric catheter, right-sided chest wall port and right-sided PICC line are noted in satisfactory position. Right-sided pleural effusion is again seen and stable. Left-sided effusion has improved which may be positional in nature. Stable left retrocardiac atelectasis is noted. IMPRESSION: Tubes and lines as described. Stable left basilar atelectasis and right-sided pleural effusion. The left effusion appears improved although this may be positional in nature. Electronically Signed   By: Inez Catalina M.D.   On: 04/02/2018 07:23    Assessment/Plan: [facility] Day # 11.  Patient is improving.  Plan to continue to wean and extubate per CCM.  Family is encouraged by her progress.    LOS: 12 days    Peggyann Shoals, MD 04/03/2018, 9:19 AM

## 2018-04-03 NOTE — Progress Notes (Signed)
CRITICAL VALUE ALERT  Critical Value:   2.3 K+  Date & Time Notied:  9/21 2333  Provider Notified: Oletta Darter  Orders Received/Actions taken: K+ replacement

## 2018-04-03 NOTE — Progress Notes (Signed)
NG tube placement unsuccessful. Dr Vertell Limber of Neurosurgery notified at 2148 of concern for Nimotop administration and no access. Advised for consult to radiology for Cortrak placement. Elink notified at 2155 of concern for PO potassium replacement and no access. Administration of Potassium will be revised. RN to continue to monitor.

## 2018-04-03 NOTE — Progress Notes (Signed)
[place] Progress Note Patient Name: Diane Short DOB: Apr 08, 1956 MRN: [id number]   Date of Service  04/03/2018  HPI/Events of Note  Nursing unable to get NGT placed. CorTrak ordered for AM. Request to change KCl PO to IV.   eICU Interventions  Will order: 1. BMP now.  2. Change Protonix PO to Pepcid IV.      Intervention Category Major Interventions: Electrolyte abnormality - evaluation and management  Sommer,Steven Eugene 04/03/2018, 10:20 PM

## 2018-04-03 NOTE — Progress Notes (Signed)
PULMONARY / CRITICAL CARE MEDICINE   NAME:  Diane Short, MRN:  [id number], DOB:  27-Mar-1956, LOS: 12 ADMISSION DATE:  03/22/2018, CONSULTATION DATE: 03/22/2018 REFERRING MD:  Dr. Leonette Monarch, ER, CHIEF COMPLAINT:  Headache  BRIEF HISTORY:    62 yo female smoker with hx of cerebral aneurysms s/p intervention presented with persistent headache, and altered mental status.  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  Intubated for airway protection.  Nimodipine started.  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  SIGNIFICANT PAST MEDICAL HISTORY   Stage 4 Breast cancer, Neuropathy, HTN, Hiatal hernia, GERD, Depression  SIGNIFICANT EVENTS:  9/09 Admit 9/10 cerebral angiogram with coiling 9/11 extubated >> reintubated due to stridor and start steroids; start ABx for UTI, aspiration 9/13 wheezing, respiratory acidosis >> paralytic x 1, deep sedation. STarted on nimbex and solumedrol 9/14-  on nimbex since yesterday due to vent dynschorny. On fent, versed and diprivan. BIS 40-60. Pulse ox 89%, 45% fio2, peep - 5, On Cardene  For goal sbp < 160. Currently 135. On tube feeds. Lot of 3rd spacing.  +. Rt hand bruising - worse per RN but pulse rt radial, rt ulnar and cap refill on right hand. Started lasix 9/16 remains paralyzed, diuresed, attempt to get paralytics off today 9/17 diuresed very well overnight but Na increased, tolerated nimbex off, minimizing sedation and holding diureses for today, decrease PEEP and FiO2 to 5 and 30%. 9/20 diuresed well and beginning active weaning 9/21 Net negative 6 L, weaning well on 5/5  STUDIES:   CT head 9/09 >> SAH, mild hydrocephalus EEG 9/09 >> slowing CT head 9/10 >> stable SAH and IVH Echo 9/10 >> left ventricular EF 55 to 60%.  Grade 1 diastolic dysfunction.  CULTURES:  Urine 9/09 >> E coli Sputum 9/09 >> oral flora Blood 9/09 >> No growth 9/20 Blood Urine 9/20>> No growth Sputum 9/20>> GS>> MODERATE GRAM POSITIVE COCCI   MODERATE GRAM POSITIVE RODS >>  ANTIBIOTICS:  Zosyn 9/11 >> 9/13 Rocephin 9/13 >> 9/17  LINES/TUBES:  Rt port >> ETT 9/09 >> 9/11 ETT 9/11 >> Rt radial aline 9/13 >>   CONSULTANTS:  Neurosurgery 9/09 >>  SUBJECTIVE:  Weaning on 5/5 with good volumes and controlled rate>> consider extubation NA 136, Potassium 2.8, chloride 85>> contraction alkalosis T max 99.5  CONSTITUTIONAL: BP 121/60   Pulse 69   Temp 98.8 F (37.1 C)   Resp (!) 21   Ht '5\' 4"'$  (1.626 m)   Wt 102 kg   LMP 08/14/2010 (Exact Date)   SpO2 100%   BMI 38.60 kg/m   I/O last 3 completed shifts: In: 6324.6 [I.V.:3124.6; NG/GT:3200] Out: 12075 [Urine:11175; Stool:900]   Vent Mode: PRVC FiO2 (%):  [30 %] 30 % Set Rate:  [20 bmp] 20 bmp Vt Set:  [440 mL] 440 mL PEEP:  [5 cmH20] 5 cmH20 Pressure Support:  [5 cmH20-10 cmH20] 10 cmH20 Plateau Pressure:  [14 EVO35-00 cmH20] 21 cmH20  PHYSICAL EXAM: General Appearance:  Elderly female, supine in bed  weaning on CPAP/ PS, NAD ENT: NCAT, Oral ETT, OG tube,No JVD or LAD noted, No thyromegally, thick neck Lungs: Bilateral chest excursion, Clear throughout, diminished per bases Heart:  NSR per tele,S1, S2, RRR, No RMG Abdomen:  Soft, NT, ND and +BS,Body mass index is 38.6 kg/m. Extremities:  1+ edema, otherwise no obvious abnormalities Skin:  Intact, warm and dry without mottling, no rash or lesions Neurologic:  Opens eyes,  follows commands, MAE x 4 weakly  LABS   PULMONARY Recent Labs  Lab 03/28/18 0315 03/29/18 0927 03/30/18 0403 03/31/18 0338 04/02/18 0355 04/03/18 0310  PHART 7.260* 7.440 7.449 7.517* 7.522* 7.552*  PCO2ART 58.1* 42.7 46.5 36.7 40.9 40.3  PO2ART 104.0 69.0* 81.0* 71.0* 79.4* 94.0  HCO3 25.2 29.0* 32.2* 29.7* 33.4* 35.4*  TCO2 27 30 34* 31  --  37*  O2SAT 95.0 94.0 96.0 96.0 96.8 98.0   CBC Recent Labs  Lab 04/01/18 0435 04/02/18 0500 04/03/18 0510  HGB 7.8* 7.6* 8.2*  HCT 26.5* 25.4* 26.0*  WBC 8.5 8.7 11.0*  PLT  147* 172 186   COAGULATION No results for input(s): INR in the last 168 hours. CARDIAC  No results for input(s): TROPONINI in the last 168 hours. No results for input(s): PROBNP in the last 168 hours.  CHEMISTRY Recent Labs  Lab 03/30/18 0341 03/31/18 0325  03/31/18 2023 04/01/18 0435 04/02/18 0500 04/02/18 2143 04/03/18 0510  NA 160* 156*   < > 156* 155* 146* 139 136  K 3.4* 2.8*   < > 3.8 3.4* 3.1* 3.2* 2.8*  CL 114* 116*   < > 119* 117* 102 91* 85*  CO2 28 29   < > _0 33* 34*  GLUCOSE 165* 155*   < > 155* 157* 225* 189* 212*  BUN 68* 56*   < > 42* 34* 33* 35* 35*  CREATININE 0.97 0.81   < > 0.69 0.63 0.84 0.96 0.94  CALCIUM 9.2 9.0   < > 9.0 8.9 8.9 9.1 9.1  MG 2.4 2.3  --   --  2.2 1.6*  --  2.0  PHOS 6.0* 4.6  --   --  3.2 3.6  --  5.4*   < > = values in this interval not displayed.   Estimated Creatinine Clearance: 73.9 mL/min (by C-G formula based on SCr of 0.94 mg/dL).  LIVER Recent Labs  Lab 03/28/18 0433 04/03/18 0510  AST 106* 29  ALT 93* 40  ALKPHOS 100 85  BILITOT 0.8 0.7  PROT 5.4* 6.2*  ALBUMIN 2.5* 2.6*   INFECTIOUS Recent Labs  Lab 03/28/18 0229  03/30/18 0341 04/02/18 1026 04/03/18 0510  LATICACIDVEN 1.7  --   --   --   --   PROCALCITON  --    < > 0.46 0.26 0.36   < > = values in this interval not displayed.   ENDOCRINE CBG (last 3)  Recent Labs    04/02/18 2333 04/03/18 0342 04/03/18 0731  GLUCAP 188* 215* 139*   IMAGING x48h  - image(s) personally visualized  -   highlighted in bold Dg Chest Port 1 View  Result Date: 04/03/2018 CLINICAL DATA:  Respiratory failure EXAM: PORTABLE CHEST 1 VIEW COMPARISON:  04/02/2018 FINDINGS: Endotracheal tube terminates 7.5 cm above the carina. Mild patchy right lower lobe opacity, likely atelectasis, improved. Right pleural effusion is improved. Left lung is essentially clear. No pneumothorax. Right chest power port terminates at the cavoatrial junction. The heart is normal in size. Enteric  tube courses into the diaphragm. IMPRESSION: Endotracheal tube terminates 7.5 cm above the carina. Mild right lower lobe opacity, likely atelectasis, improved. Right pleural effusion is improved. Electronically Signed   By: Julian Hy M.D.   On: 04/03/2018 07:08   Dg Chest Port 1 View  Result Date: 04/02/2018 CLINICAL DATA:  Check endotracheal tube placement EXAM: PORTABLE CHEST 1 VIEW COMPARISON:  03/31/2018 FINDINGS: Cardiac shadow is mildly accentuated by  the portable technique. Endotracheal tube, nasogastric catheter, right-sided chest wall port and right-sided PICC line are noted in satisfactory position. Right-sided pleural effusion is again seen and stable. Left-sided effusion has improved which may be positional in nature. Stable left retrocardiac atelectasis is noted. IMPRESSION: Tubes and lines as described. Stable left basilar atelectasis and right-sided pleural effusion. The left effusion appears improved although this may be positional in nature. Electronically Signed   By: Inez Catalina M.D.   On: 04/02/2018 07:23    ASSESSMENT AND PLAN   History of tobacco abuse Acute respiratory failure with hypoxia, hypercapnia Postextubation stridor 9/11.-  with acute bronchospasm 9/13 and reintubated.  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 - Continue Nimotop - Keppra for seizure prophylaxis - Goal SBP <140-160 - cardene drip, minimize as able - Coreg home dose - Norvasc 10 mg PO daily with hold parameters - Catapress to 0.2 mg weekly  Acute metabolic encephalopathy. - improving - Minimize sedation as able  Volume overload  Net negative 6 L Contraction alkalosis 2/2 diuresis - Stop lasix gtt - Hold  free water for now - Replace electrolytes as indicated - BMET at 2000 9/21 and in am 9/22 - Replete K aggressively   Aspiration pneumonia. 9/21: Mild right lower lobe opacity, likely atelectasis, improved. Right pleural effusion is improved. T Max 99.5 last 24 PCT 0.36 WBC 11 E coli UTI. - Completed  Rocephin on  9/17 Plan: - Follow micro 9/20 cultures - Trend PCT  - PCT to guide ABX treatment   Hx of HTN. - Coreg - increase dose 03/28/18 - PRN hydralazine - Cardene drip off for now - Catapres patch to 0.2 weekly - Norvasc 10 mg PO with holding parameters   Stage 4 breast cancer s/p mastectomy, XRT to brain met, and recently on chemo. Chemo induced anemia, thrombocytopenia. - Followed by oncology in [place] - F/u CBC  Hyperglycemia. - SSI - CBGs  SUMMARY OF TODAY'S PLAN:  Family updated bedside, continue active diureses today, D5W as ordered, BMET at 2000 , repletion of potassium, dosing zaroxylyn and diamox for contraction alkalosis. Pan culture for temp.  Best Practice / Goals of Care / Disposition.   DVT PROPHYLAXIS: SCDs SUP: protonix NUTRITION: tube feeds MOBILITY: bed rest GOALS OF CARE: full code FAMILY DISCUSSIONS:  Updated family at bedside 9/13. Husband updated 03/27/18, Husband 03/28/18, Husband 9/20   Magdalen Spatz, AGACNP-BC [place] Pager # [phone]  04/03/2018 10:38 AM   Attending Note:  62 year old female with history of breast cancer presenting with a CVA.  The patient has done well overnight, diuresed very well and Na is improving.  On exam, she opens her eyes but not following commands.  I reviewed CXR myself, ETT is in a good position.  Discussed with PCCM-NP.  Will decrease lasix to 8 mg/hr drip, give zaroxolyn 5 mg x1 dose, diamox 250 q6 x3 doses.  Replace K.  Keep on 5/5.  Hold all sedation.  Will re-evaluate for extubation later on today.  If agitated will start precedex.  The patient is critically ill  with multiple organ systems failure and requires high complexity decision making for assessment and support, frequent evaluation  and titration of therapies, application of advanced monitoring technologies and extensive interpretation of multiple databases.   Critical Care Time devoted to patient care services described in this note is  34  Minutes. This time reflects time of care of this signee Dr Jennet Maduro. This critical care time does not reflect procedure time, or teaching time or supervisory time of PA/NP/Med student/Med Resident etc but could involve care discussion time.  Magdalen Spatz, AGACNP-BC [place]. Pager: [phone].  04/03/2018 10:39 AM

## 2018-04-04 ENCOUNTER — Inpatient Hospital Stay (HOSPITAL_COMMUNITY): Payer: Medicaid Other

## 2018-04-04 LAB — BASIC METABOLIC PANEL
Anion gap: 17 — ABNORMAL HIGH (ref 5–15)
Anion gap: 10 (ref 5–15)
BUN: 32 mg/dL — ABNORMAL HIGH (ref 6–20)
BUN: 22 mg/dL — ABNORMAL HIGH (ref 6–20)
CHLORIDE: 101 mmol/L (ref 98–111)
CO2: 30 mmol/L (ref 22–32)
CO2: 27 mmol/L (ref 22–32)
Calcium: 9.1 mg/dL (ref 8.9–10.3)
Calcium: 8.9 mg/dL (ref 8.9–10.3)
Chloride: 92 mmol/L — ABNORMAL LOW (ref 98–111)
Creatinine, Ser: 0.93 mg/dL (ref 0.44–1.00)
Creatinine, Ser: 0.87 mg/dL (ref 0.44–1.00)
GFR calc non Af Amer: >60 mL/min (ref >60)
Glucose, Bld: 112 mg/dL — ABNORMAL HIGH (ref 70–99)
Glucose, Bld: 140 mg/dL — ABNORMAL HIGH (ref 70–99)
POTASSIUM: 2.2 mmol/L — AB (ref 3.5–5.1)
Potassium: 2.4 mmol/L — CL (ref 3.5–5.1)
SODIUM: 139 mmol/L (ref 135–145)
SODIUM: 138 mmol/L (ref 135–145)

## 2018-04-04 LAB — CULTURE, RESPIRATORY W GRAM STAIN: Culture: NORMAL

## 2018-04-04 LAB — CBC
HEMATOCRIT: 26.7 % — AB (ref 36.0–46.0)
Hemoglobin: 8.3 g/dL — ABNORMAL LOW (ref 12.0–15.0)
MCH: 30.5 pg (ref 26.0–34.0)
MCHC: 31.1 g/dL (ref 30.0–36.0)
MCV: 98.2 fL (ref 78.0–100.0)
PLATELETS: 224 10*3/uL (ref 150–400)
RBC: 2.72 MIL/uL — ABNORMAL LOW (ref 3.87–5.11)
RDW: 18.6 % — ABNORMAL HIGH (ref 11.5–15.5)
WBC: 12.4 10*3/uL — ABNORMAL HIGH (ref 4.0–10.5)

## 2018-04-04 LAB — GLUCOSE, CAPILLARY
GLUCOSE-CAPILLARY: 112 mg/dL — AB (ref 70–99)
GLUCOSE-CAPILLARY: 129 mg/dL — AB (ref 70–99)
GLUCOSE-CAPILLARY: 96 mg/dL (ref 70–99)
Glucose-Capillary: 121 mg/dL — ABNORMAL HIGH (ref 70–99)
Glucose-Capillary: 129 mg/dL — ABNORMAL HIGH (ref 70–99)
Glucose-Capillary: 112 mg/dL — ABNORMAL HIGH (ref 70–99)

## 2018-04-04 LAB — CULTURE, RESPIRATORY

## 2018-04-04 LAB — PROCALCITONIN: Procalcitonin: 0.44 ng/mL

## 2018-04-04 MED ORDER — ORAL CARE MOUTH RINSE
15.0000 mL | Freq: Two times a day (BID) | OROMUCOSAL | Status: DC
  Administered 2018-04-04 – 2018-04-05 (×3): 15 mL via OROMUCOSAL

## 2018-04-04 MED ORDER — LORAZEPAM 2 MG/ML IJ SOLN
0.5000 mg | Freq: Every evening | INTRAMUSCULAR | Status: DC | PRN
  Administered 2018-04-04 – 2018-04-08 (×3): 0.5 mg via INTRAVENOUS
  Filled 2018-04-04 (×3): qty 1

## 2018-04-04 MED ORDER — POTASSIUM CHLORIDE 10 MEQ/50ML IV SOLN
10.0000 meq | INTRAVENOUS | Status: AC
  Administered 2018-04-04 (×3): 10 meq via INTRAVENOUS
  Filled 2018-04-04 (×3): qty 50

## 2018-04-04 MED ORDER — POTASSIUM CHLORIDE 20 MEQ/15ML (10%) PO SOLN: 40.0000 meq | Freq: Once | ORAL | Status: DC

## 2018-04-04 MED ORDER — IPRATROPIUM-ALBUTEROL 0.5-2.5 (3) MG/3ML IN SOLN: 3.0000 mL | Freq: Four times a day (QID) | RESPIRATORY_TRACT | Status: DC | PRN

## 2018-04-04 MED ORDER — POTASSIUM CHLORIDE 10 MEQ/50ML IV SOLN
10.0000 meq | INTRAVENOUS | Status: DC | PRN
  Administered 2018-04-04: 10 meq via INTRAVENOUS
  Filled 2018-04-04: qty 50

## 2018-04-04 MED ORDER — POTASSIUM CHLORIDE 10 MEQ/50ML IV SOLN
10.0000 meq | INTRAVENOUS | Status: AC
  Administered 2018-04-04 – 2018-04-05 (×6): 10 meq via INTRAVENOUS
  Filled 2018-04-04 (×6): qty 50

## 2018-04-04 MED ORDER — SODIUM CHLORIDE 0.9 % IV SOLN
3.0000 g | Freq: Four times a day (QID) | INTRAVENOUS | Status: AC
  Administered 2018-04-04 – 2018-04-06 (×10): 3 g via INTRAVENOUS
  Filled 2018-04-04 (×10): qty 3

## 2018-04-04 MED ORDER — RESOURCE THICKENUP CLEAR PO POWD
ORAL | Status: DC | PRN
  Filled 2018-04-04 (×2): qty 125

## 2018-04-04 NOTE — Evaluation (Signed)
Clinical/Bedside Swallow Evaluation Patient Details  Name: Diane Short MRN: [id number] Date of Birth: 09-01-1955  Today's Date: 04/04/2018 Time: SLP Start Time (ACUTE ONLY): 3 SLP Stop Time (ACUTE ONLY): 1119 SLP Time Calculation (min) (ACUTE ONLY): 43 min  Past Medical History:  Past Medical History:  Diagnosis Date  . 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 Anxiety   . Arthritis   . Cancer [facility])    breast cancer  . Cerebral hemorrhage ([place]) 1986  . Depression    due to cancer dx  . Diverticulosis   . GERD (gastroesophageal reflux disease)   . Headache   . History of hiatal hernia   . Hypertension   . 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 Neutropenic fever ([place])    Chemo related  . Osteonecrosis ([place]) 2017-2018   Right Jaw bone  . Thrush, oral    Chemo related   Past Surgical History:  Past Surgical History:  Procedure Laterality Date  . BREAST SURGERY Left 11/2016   metastic to bone  . CATARACT EXTRACTION Right 03/02/2017  . CEREBRAL ANGIOGRAM  1986  . CESAREAN SECTION  1980  . IR 3D INDEPENDENT WKST  01/20/2017  . IR 3D INDEPENDENT WKST  05/26/2017  . IR ANGIO INTRA EXTRACRAN SEL COM CAROTID INNOMINATE UNI R MOD SED  04/09/2017  . IR ANGIO INTRA EXTRACRAN SEL INTERNAL CAROTID BILAT MOD SED  01/20/2017  . IR ANGIO INTRA EXTRACRAN SEL INTERNAL CAROTID BILAT MOD SED  11/17/2017  . IR ANGIO INTRA EXTRACRAN SEL INTERNAL CAROTID BILAT MOD SED  03/23/2018  . IR ANGIO INTRA EXTRACRAN SEL INTERNAL CAROTID UNI R MOD SED  03/13/2017  . IR ANGIO VERTEBRAL SEL VERTEBRAL BILAT MOD SED  01/20/2017  . IR ANGIO VERTEBRAL SEL VERTEBRAL UNI L MOD SED  05/26/2017  . IR ANGIO VERTEBRAL SEL VERTEBRAL UNI L MOD SED  11/17/2017  . IR ANGIOGRAM FOLLOW UP STUDY  03/13/2017  . IR ANGIOGRAM FOLLOW UP STUDY  03/13/2017  . IR ANGIOGRAM FOLLOW UP STUDY  03/13/2017  . IR ANGIOGRAM FOLLOW UP STUDY  03/13/2017  . IR ANGIOGRAM FOLLOW UP STUDY  03/13/2017  . IR ANGIOGRAM FOLLOW UP STUDY  03/13/2017  . IR ANGIOGRAM  FOLLOW UP STUDY  03/13/2017  . IR ANGIOGRAM FOLLOW UP STUDY  03/13/2017  . IR ANGIOGRAM FOLLOW UP STUDY  03/13/2017  . IR ANGIOGRAM FOLLOW UP STUDY  03/13/2017  . IR ANGIOGRAM FOLLOW UP STUDY  03/13/2017  . IR ANGIOGRAM FOLLOW UP STUDY  03/13/2017  . IR ANGIOGRAM FOLLOW UP STUDY  04/09/2017  . IR ANGIOGRAM FOLLOW UP STUDY  05/26/2017  . IR ANGIOGRAM FOLLOW UP STUDY  05/26/2017  . IR ANGIOGRAM FOLLOW UP STUDY  03/23/2018  . IR NEURO EACH ADD'L AFTER BASIC UNI LEFT (MS)  04/09/2017  . IR NEURO EACH ADD'L AFTER BASIC UNI LEFT (MS)  03/23/2018  . IR NEURO EACH ADD'L AFTER BASIC UNI RIGHT (MS)  03/13/2017  . IR TRANSCATH/EMBOLIZ  03/13/2017  . IR TRANSCATH/EMBOLIZ  04/09/2017  . IR TRANSCATH/EMBOLIZ  05/26/2017  . IR TRANSCATH/EMBOLIZ  03/23/2018  . IR US GUIDE VASC ACCESS RIGHT  04/09/2017  . MASTECTOMY Left   . port a cathh  04/2016  . RADIOLOGY WITH ANESTHESIA N/A 03/13/2017   Procedure: stent coiling of right MCA aneurysm;  Surgeon: Consuella Lose, MD;  Location: [facility];  Service: Radiology;  Laterality: N/A;  . RADIOLOGY WITH ANESTHESIA N/A 04/09/2017   Procedure: Arteriogram, pipeline  embolization of aneruysm;  Surgeon: Consuella Lose, MD;  Location: [place];  Service: Radiology;  Laterality: N/A;  . RADIOLOGY WITH ANESTHESIA N/A 05/26/2017   Procedure: Stent supported coil embolization;  Surgeon: Consuella Lose, MD;  Location: [place];  Service: Radiology;  Laterality: N/A;  . RADIOLOGY WITH ANESTHESIA N/A 03/23/2018   Procedure: IR WITH ANESTHESIA;  Surgeon: Consuella Lose, MD;  Location: [place];  Service: Radiology;  Laterality: N/A;  . TUBAL LIGATION  1983   HPI:  62 yo female hx smoking, stage 4 breast cancer with brain met tx with chemo and brain XRT, cerebral aneurysms s/p intervention presented with persistent headache, and altered mental status. Found to have stable intraventricular hemorrhage as well as subarachnoid hemorrhage concentrated in left prepontine cistern and left CP  angle cistern. Intubated 9/9-9/11 and re-intubated 9/11-9/21. CXR 9/22 mild patchy/platelike lower lobe opacities, likely atelectasis. Multiple attempts at NGT unsuccessful and pt has crucial po medication.     Assessment / Plan / Recommendation Clinical Impression  Pt awake, following simple commands, verbally responding with processing delays and intermittent stimulation to attend during evaluation. Mild amount hard, dry secretions on hard palate minimally loosened/removed with oral care (improving with each oral care attempt). Thyroid area appears edematous when palpated and exhibits pharyngeal dysphagia with suspected airway intrusion likely due to prolonged 13 day intubation resulting in immediate and strong cough with cup sips water. S/S minimal and decreased with mixture of applesauce/liquid meds/thickener although honey thick suspected to penetrate airway. She requires a slow pace and double swallows. Recommend she remain NPO except and prefer IV med. For vital PO medicine and in if liquid form, mix with applesauce and thickener power. If whole pill, crush. Continue oral hygeine focusing on hard palate directly behind teeth. Pt will need instrumental swallow assessment when more appropriate, able to sustain alertness for longer periods.  SLP Visit Diagnosis: Dysphagia, oropharyngeal phase (R13.12)    Aspiration Risk  Moderate aspiration risk    Diet Recommendation NPO except meds   Medication Administration: Crushed with puree(liquid meds mixed with puree pudding thick)    Other  Recommendations Oral Care Recommendations: Oral care QID;Oral care prior to ice chip/H20   Follow up Recommendations Inpatient Rehab      Frequency and Duration min 2x/week  2 weeks       Prognosis Prognosis for Safe Diet Advancement: Good Barriers to Reach Goals: Cognitive deficits      Swallow Study   General HPI: 62 yo female hx smoking, stage 4 breast cancer with brain met tx with chemo and brain  XRT, cerebral aneurysms s/p intervention presented with persistent headache, and altered mental status. Found to have stable intraventricular hemorrhage as well as subarachnoid hemorrhage concentrated in left prepontine cistern and left CP angle cistern. Intubated 9/9-9/11 and re-intubated 9/11-9/21. CXR 9/22 mild patchy/platelike lower lobe opacities, likely atelectasis. Multiple attempts at NGT unsuccessful and pt has crucial po medication.   Type of Study: Bedside Swallow Evaluation Previous Swallow Assessment: (none) Diet Prior to this Study: NPO Temperature Spikes Noted: No Respiratory Status: Room air History of Recent Intubation: Yes Length of Intubations (days): 13 days Date extubated: 04/03/18 Behavior/Cognition: Cooperative;Requires cueing(moderate alertness) Oral Cavity Assessment: Dry;Dried secretions Oral Care Completed by SLP: Yes Oral Cavity - Dentition: Adequate natural dentition Vision: Functional for self-feeding Self-Feeding Abilities: Needs assist Patient Positioning: Upright in bed Baseline Vocal Quality: Hoarse;Low vocal intensity Volitional Cough: Strong Volitional Swallow: Able to elicit    Oral/Motor/Sensory Function Overall Oral Motor/Sensory Function: Moderate  impairment Facial ROM: Reduced left;Suspected CN VII (facial) dysfunction Facial Symmetry: Abnormal symmetry right;Suspected CN VII (facial) dysfunction Facial Strength: Reduced right;Suspected CN VII (facial) dysfunction Lingual ROM: Within Functional Limits Lingual Symmetry: Within Functional Limits Lingual Strength: Within Functional Limits Mandible: Within Functional Limits   Ice Chips Ice chips: Impaired Presentation: Spoon Oral Phase Impairments: Reduced lingual movement/coordination Pharyngeal Phase Impairments: Cough - Delayed;Multiple swallows   Thin Liquid Thin Liquid: Impaired Presentation: Cup Oral Phase Impairments: Reduced labial seal Oral Phase Functional Implications: Right  anterior spillage Pharyngeal  Phase Impairments: Cough - Immediate;Cough - Delayed;Multiple swallows    Nectar Thick Nectar Thick Liquid: Not tested   Honey Thick Honey Thick Liquid: Not tested   Puree Puree: Impaired Presentation: Spoon Oral Phase Impairments: Other (comment)(functional) Pharyngeal Phase Impairments: Multiple swallows   Solid     Solid: Not tested      Houston Siren 04/04/2018,2:19 PM  Orbie Pyo Colvin Caroli.Ed Risk analyst [phone] Office [phone]

## 2018-04-04 NOTE — Progress Notes (Signed)
[facility] Progress Note Patient Name: Diane Short DOB: 03-Jul-1956 MRN: [id number]   Date of Service  04/04/2018  HPI/Events of Note  K+ = 2.4 this AM. K+ replacement ordered, but never given by nursing.   eICU Interventions  Will order: 1. BMP now.   Further K+ replacement per results of BMP.     Intervention Category Major Interventions: Electrolyte abnormality - evaluation and management  Lysle Dingwall 04/04/2018, 9:36 PM

## 2018-04-04 NOTE — Progress Notes (Signed)
Subjective: Patient reports extubated.  Speaking and following commands.  Objective: Vital signs in last 24 hours: Temp:  [98.4 F (36.9 C)-100.8 F (38.2 C)] 99.9 F (37.7 C) (09/22 0800) Pulse Rate:  [60-93] 77 (09/22 0900) Resp:  [14] 14 (09/21 1110) BP: (114-175)/(43-100) 148/63 (09/22 0900) SpO2:  [91 %-100 %] 94 % (09/22 0900) Arterial Line BP: (101-147)/(51-65) 101/51 (09/21 1200) FiO2 (%):  [30 %] 30 % (09/21 1200)  Intake/Output from previous day: 09/21 0701 - 09/22 0700 In: 1274.4 [I.V.:920.5; IV Piggyback:353.9] Out: 3515 [Urine:2865; Stool:650] Intake/Output this shift: Total I/O In: 58.5 [I.V.:35.7; IV Piggyback:22.8] Out: -   Physical Exam: Awake, alert, has cough to protect airway.  Following commands all 4 extremities.  Lab Results: Recent Labs    04/03/18 0510 04/04/18 0506  WBC 11.0* 12.4*  HGB 8.2* 8.3*  HCT 26.0* 26.7*  PLT 186 224   BMET Recent Labs    04/03/18 2238 04/04/18 0506  NA 139 139  K 2.3* 2.4*  CL 91* 92*  CO2 33* 30  GLUCOSE 134* 112*  BUN 33* 32*  CREATININE 0.84 0.93  CALCIUM 9.0 9.1    Studies/Results: Dg Chest Port 1 View  Result Date: 04/04/2018 CLINICAL DATA:  Respiratory failure EXAM: PORTABLE CHEST 1 VIEW COMPARISON:  04/03/2018 FINDINGS: Interval extubation. Mild patchy/platelike right lower lobe opacity, likely atelectasis. Left basilar opacity, likely atelectasis. No frank interstitial edema. No pleural effusion or pneumothorax. Heart is normal in size. Right arm PICC terminates at the cavoatrial junction. Right chest power port terminates in the mid SVC. IMPRESSION: Interval extubation. Mild patchy/platelike lower lobe opacities, likely atelectasis. Electronically Signed   By: Julian Hy M.D.   On: 04/04/2018 07:54   Dg Chest Port 1 View  Result Date: 04/03/2018 CLINICAL DATA:  Respiratory failure EXAM: PORTABLE CHEST 1 VIEW COMPARISON:  04/02/2018 FINDINGS: Endotracheal tube terminates 7.5 cm above the  carina. Mild patchy right lower lobe opacity, likely atelectasis, improved. Right pleural effusion is improved. Left lung is essentially clear. No pneumothorax. Right chest power port terminates at the cavoatrial junction. The heart is normal in size. Enteric tube courses into the diaphragm. IMPRESSION: Endotracheal tube terminates 7.5 cm above the carina. Mild right lower lobe opacity, likely atelectasis, improved. Right pleural effusion is improved. Electronically Signed   By: Julian Hy M.D.   On: 04/03/2018 07:08    Assessment/Plan: Replace K+.  Working on po intake.  [employer] not available until Monday.  Will work with Speech to try po intake of meds, particularly Nimodipine.  Otherwise, change to IV.    LOS: 13 days    Peggyann Shoals, MD 04/04/2018, 9:58 AM

## 2018-04-04 NOTE — Progress Notes (Signed)
Pharmacy Antibiotic Note  Diane Short is a 62 y.o. female admitted on 03/22/2018 with SAH.  Pharmacy has been consulted for unasyn dosing for aspiration pneumonia. Tmax is 100.8 and WBC is elevated at 12.4. SCr is WNL.   Plan: Unasyn 3gm IV Q6H F/u renal fxn, C&S, clinical status and LOT  Height: 5\' 4"  (162.6 cm) Weight: 224 lb 13.9 oz (102 kg) IBW/kg (Calculated) : 54.7  Temp (24hrs), Avg:100 F (37.8 C), Min:98.4 F (36.9 C), Max:100.8 F (38.2 C)  Recent Labs  Lab 03/31/18 0325  04/01/18 0435 04/02/18 0500 04/02/18 2143 04/03/18 0510 04/03/18 2238 04/04/18 0506  WBC 8.7  --  8.5 8.7  --  11.0*  --  12.4*  CREATININE 0.81   < > 0.63 0.84 0.96 0.94 0.84 0.93   < > = values in this interval not displayed.    Estimated Creatinine Clearance: 74.7 mL/min (by C-G formula based on SCr of 0.93 mg/dL).    Allergies  Allergen Reactions  . Nsaids Other (See Comments)    GI upset   . Ace Inhibitors Other (See Comments)    cough    Antimicrobials this admission: Unasyn 9/22>> Zosyn 9/11>>9/13 Rocephin 9/13>>9/17  Dose adjustments this admission: N/A  Microbiology results: 9/20 Blood - NGTD 9/20 TA - NGTD 9/20 Urine - NEG 9/9 Urine Cx: E.coli - pan sens 9/9 resp - normal flora 9/9 Bld - negF  Thank you for allowing pharmacy to be a part of this patient's care.  Caeleb Batalla, Rande Lawman 04/04/2018 11:17 AM

## 2018-04-04 NOTE — Progress Notes (Signed)
[facility] Progress Note Patient Name: Calliope Delangel DOB: 11-13-1955 MRN: [id number]   Date of Service  04/04/2018  HPI/Events of Note  K+ = 2.2 and Creatinine = 0.87.  eICU Interventions  Will replace K+. Repeat BMP already ordered for 5 AM.     Intervention Category Major Interventions: Electrolyte abnormality - evaluation and management  Eitan Doubleday [place] 04/04/2018, 10:47 PM

## 2018-04-04 NOTE — Progress Notes (Addendum)
PULMONARY / CRITICAL CARE MEDICINE   NAME:  Diane Short, MRN:  [id number], DOB:  1956-06-03, LOS: 56 ADMISSION DATE:  03/22/2018, CONSULTATION DATE: 03/22/2018 REFERRING MD:  Dr. Leonette Monarch, ER, CHIEF COMPLAINT:  Headache  BRIEF HISTORY:    62 yo female smoker with hx of cerebral aneurysms s/p intervention presented with persistent headache, and altered mental status.  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  Intubated for airway protection.  Nimodipine started.  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  SIGNIFICANT PAST MEDICAL HISTORY   Stage 4 Breast cancer, Neuropathy, HTN, Hiatal hernia, GERD, Depression  SIGNIFICANT EVENTS:  9/09 Admit 9/10 cerebral angiogram with coiling 9/11 extubated >> reintubated due to stridor and start steroids; start ABx for UTI, aspiration 9/13 wheezing, respiratory acidosis >> paralytic x 1, deep sedation. STarted on nimbex and solumedrol 9/14-  on nimbex since yesterday due to vent dynschorny. On fent, versed and diprivan. BIS 40-60. Pulse ox 89%, 45% fio2, peep - 5, On Cardene  For goal sbp < 160. Currently 135. On tube feeds. Lot of 3rd spacing.  +. Rt hand bruising - worse per RN but pulse rt radial, rt ulnar and cap refill on right hand. Started lasix 9/16 remains paralyzed, diuresed, attempt to get paralytics off today 9/17 diuresed very well overnight but Na increased, tolerated nimbex off, minimizing sedation and holding diureses for today, decrease PEEP and FiO2 to 5 and 30%. 9/20 diuresed well and beginning active weaning 9/21 Net negative 8 L, weaning well on 5/5>> Extubated  STUDIES:   CT head 9/09 >> SAH, mild hydrocephalus EEG 9/09 >> slowing CT head 9/10 >> stable SAH and IVH Echo 9/10 >> left ventricular EF 55 to 60%.  Grade 1 diastolic dysfunction.  CULTURES:  Urine 9/09 >> E coli Sputum 9/09 >> oral flora Blood 9/09 >> No growth 9/20 Blood>> Urine 9/20>> No growth Sputum 9/20>> GS>> MODERATE GRAM  POSITIVE COCCI  MODERATE GRAM POSITIVE RODS >>  ANTIBIOTICS:  Zosyn 9/11 >> 9/13 Rocephin 9/13 >> 9/17  LINES/TUBES:  Rt port >> ETT 9/09 >> 9/11 ETT 9/11 >> Rt radial aline 9/13 >>   CONSULTANTS:  Neurosurgery 9/09 >>  SUBJECTIVE:  Extubated 9/21, States she is doing ok. You have to ask her top open her eyes. NA 139, Potassium 2.4, chloride 92 >> Diuresis has been stopped 9/21, Continues to spontaneously diurese an additional negative 2 L x 24 , net negative is 8.4 L T max 100.8 If unable to pass swallow 9/22  will need Cortrak for essential medications that do not have IV options  CONSTITUTIONAL: BP (!) 148/63   Pulse 77   Temp 99.9 F (37.7 C) (Axillary)   Resp 14   Ht '5\' 4"'  (1.626 m)   Wt 102 kg   LMP 08/14/2010 (Exact Date)   SpO2 94%   BMI 38.60 kg/m   I/O last 3 completed shifts: In: 3013.4 [I.V.:1559.5; NG/GT:1100; IV Piggyback:353.9] Out: 6065 [Urine:5015; Stool:1050]   Vent Mode: CPAP;PSV FiO2 (%):  [30 %] 30 % PEEP:  [5 cmH20] 5 cmH20 Pressure Support:  [5 cmH20] 5 cmH20  PHYSICAL EXAM: General Appearance:  Elderly female, supine in bed , on RA with sats of 94%, NAD ENT: NCAT, No JVD or LAD noted, No thyromegally, thick neck Lungs: Bilateral, even  chest rise, Clear throughout, diminished per bases Heart:  NSR per tele,S1, S2, RRR, No RMG Abdomen:  Soft, NT, ND and +BS, Body mass  index is 38.6 kg/m.  Extremities:  1+ UE edema, otherwise no obvious abnormalities Skin:  Intact, warm and dry without mottling, no rash or lesions Neurologic:  Opens eyes to command ,  follows commands, MAE x 4 weakly, quiet phonation, strong cough  LABS   PULMONARY Recent Labs  Lab 03/29/18 0927 03/30/18 0403 03/31/18 0338 04/02/18 0355 04/03/18 0310  PHART 7.440 7.449 7.517* 7.522* 7.552*  PCO2ART 42.7 46.5 36.7 40.9 40.3  PO2ART 69.0* 81.0* 71.0* 79.4* 94.0  HCO3 29.0* 32.2* 29.7* 33.4* 35.4*  TCO2 30 34* 31  --  37*  O2SAT 94.0 96.0 96.0 96.8 98.0    CBC Recent Labs  Lab 04/02/18 0500 04/03/18 0510 04/04/18 0506  HGB 7.6* 8.2* 8.3*  HCT 25.4* 26.0* 26.7*  WBC 8.7 11.0* 12.4*  PLT 172 186 224   COAGULATION No results for input(s): INR in the last 168 hours. CARDIAC  No results for input(s): TROPONINI in the last 168 hours. No results for input(s): PROBNP in the last 168 hours.  CHEMISTRY Recent Labs  Lab 03/30/18 0341 03/31/18 0325  04/01/18 0435 04/02/18 0500 04/02/18 2143 04/03/18 0510 04/03/18 2238 04/04/18 0506  NA 160* 156*   < > 155* 146* 139 136 139 139  K 3.4* 2.8*   < > 3.4* 3.1* 3.2* 2.8* 2.3* 2.4*  CL 114* 116*   < > 117* 102 91* 85* 91* 92*  CO2 28 29   < > 28 30 33* 34* 33* 30  GLUCOSE 165* 155*   < > 157* 225* 189* 212* 134* 112*  BUN 68* 56*   < > 34* 33* 35* 35* 33* 32*  CREATININE 0.97 0.81   < > 0.63 0.84 0.96 0.94 0.84 0.93  CALCIUM 9.2 9.0   < > 8.9 8.9 9.1 9.1 9.0 9.1  MG 2.4 2.3  --  2.2 1.6*  --  2.0  --   --   PHOS 6.0* 4.6  --  3.2 3.6  --  5.4*  --   --    < > = values in this interval not displayed.   Estimated Creatinine Clearance: 74.7 mL/min (by C-G formula based on SCr of 0.93 mg/dL).  LIVER Recent Labs  Lab 04/03/18 0510  AST 29  ALT 40  ALKPHOS 85  BILITOT 0.7  PROT 6.2*  ALBUMIN 2.6*   INFECTIOUS Recent Labs  Lab 04/02/18 1026 04/03/18 0510 04/04/18 0506  PROCALCITON 0.26 0.36 0.44   ENDOCRINE CBG (last 3)  Recent Labs    04/03/18 2319 04/04/18 0321 04/04/18 0754  GLUCAP 132* 112* 121*   IMAGING x48h  - image(s) personally visualized  -   highlighted in bold Dg Chest Port 1 View  Result Date: 04/04/2018 CLINICAL DATA:  Respiratory failure EXAM: PORTABLE CHEST 1 VIEW COMPARISON:  04/03/2018 FINDINGS: Interval extubation. Mild patchy/platelike right lower lobe opacity, likely atelectasis. Left basilar opacity, likely atelectasis. No frank interstitial edema. No pleural effusion or pneumothorax. Heart is normal in size. Right arm PICC terminates at the  cavoatrial junction. Right chest power port terminates in the mid SVC. IMPRESSION: Interval extubation. Mild patchy/platelike lower lobe opacities, likely atelectasis. Electronically Signed   By: Julian Hy M.D.   On: 04/04/2018 07:54   Dg Chest Port 1 View  Result Date: 04/03/2018 CLINICAL DATA:  Respiratory failure EXAM: PORTABLE CHEST 1 VIEW COMPARISON:  04/02/2018 FINDINGS: Endotracheal tube terminates 7.5 cm above the carina. Mild patchy right lower lobe opacity, likely atelectasis, improved. Right pleural effusion is improved.  Left lung is essentially clear. No pneumothorax. Right chest power port terminates at the cavoatrial junction. The heart is normal in size. Enteric tube courses into the diaphragm. IMPRESSION: Endotracheal tube terminates 7.5 cm above the carina. Mild right lower lobe opacity, likely atelectasis, improved. Right pleural effusion is improved. Electronically Signed   By: Julian Hy M.D.   On: 04/03/2018 07:08    ASSESSMENT AND PLAN   History of tobacco abuse Acute respiratory failure with hypoxia, hypercapnia Postextubation stridor 9/11.-  with acute bronchospasm 9/13 and reintubated.  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 - Continue Nimotop - Keppra for seizure prophylaxis - Goal SBP <140-160 - cardene drip, minimize as able - Coreg home dose - Norvasc 10 mg PO daily with hold parameters - Catapress to 0.2 mg weekly  Acute metabolic encephalopathy. - improving - Minimize sedation as able  Volume overload  Net negative 8 L Contraction alkalosis 2/2 diuresis - lasix gtt stopped 9/21 - Hold free water for now - Replace electrolytes as indicated - BMET daily - Replete K aggressively   Aspiration pneumonia. 9/22: CXR personally  reviewed:Mild patchy/platelike lower lobe opacities, likely atelectasis.  T Max 100.8 last 24 PCT uptrending  0.44 WBC uptrending 12.4 E coli UTI. - Completed  Rocephin on  9/17 Plan: - Follow micro 9/20 cultures - Trend PCT  - Unasyn per pharmacy Trend WBC and fever curve  GI Currently without OG or Cortrack access for medication administration Plan: Swallow re-eval per Speech 9/22 If does not pass will need Cortrack for essential meds and nutrition Team not available until 9/23   Hx of HTN. - Coreg - increase dose 03/28/18 - PRN hydralazine - Cardene drip off for now - Catapres patch to 0.2 weekly - Norvasc 10 mg PO with holding parameters   Stage 4 breast cancer s/p mastectomy, XRT to brain met, and recently on chemo. Chemo induced anemia, thrombocytopenia. - Followed by oncology in [place] - F/u CBC  Hyperglycemia. - SSI - CBGs  SUMMARY OF TODAY'S PLAN:  Weaned to RA, Good cough,Fever with increasing WBC and PCT. Unasyn per RX Swallow can do pudding thick with applesauce for meds only.  Will need Cortrack placement 9/23 am Family updated bedside,  9/22,  repletion of potassium,  Pan culture for temp. Pt. Not sleeping at night>> per neuro will add home dose ativan for bedtime only Best Practice / Goals of Care / Disposition.   DVT PROPHYLAXIS: SCDs SUP: protonix NUTRITION: tube feeds MOBILITY: bed rest GOALS OF CARE: full code FAMILY DISCUSSIONS:  Updated family at bedside 9/13. Husband updated 03/27/18, Husband 03/28/18, Husband 9/22   Magdalen Spatz, AGACNP-BC [place] Pager # [phone]  04/04/2018 10:06 AM

## 2018-04-04 NOTE — Progress Notes (Signed)
CRITICAL VALUE ALERT  Critical Value:  2.2 K+  Date & Time Notied:  9/22 2241  Provider Notified: Elzie Rings RN Elink/ Dr Quentin Ore  Orders Received/Actions taken: K+ replacement ordered

## 2018-04-05 ENCOUNTER — Inpatient Hospital Stay (HOSPITAL_COMMUNITY): Payer: Medicaid Other

## 2018-04-05 DIAGNOSIS — I609 Nontraumatic subarachnoid hemorrhage, unspecified: Secondary | ICD-10-CM

## 2018-04-05 LAB — CBC
HCT: 26.9 % — ABNORMAL LOW (ref 36.0–46.0)
HEMOGLOBIN: 8.2 g/dL — AB (ref 12.0–15.0)
MCH: 30.3 pg (ref 26.0–34.0)
MCHC: 30.5 g/dL (ref 30.0–36.0)
MCV: 99.3 fL (ref 78.0–100.0)
Platelets: 266 10*3/uL (ref 150–400)
RBC: 2.71 MIL/uL — AB (ref 3.87–5.11)
RDW: 19.3 % — ABNORMAL HIGH (ref 11.5–15.5)
WBC: 10.1 10*3/uL (ref 4.0–10.5)

## 2018-04-05 LAB — GLUCOSE, CAPILLARY
GLUCOSE-CAPILLARY: 108 mg/dL — AB (ref 70–99)
GLUCOSE-CAPILLARY: 148 mg/dL — AB (ref 70–99)
GLUCOSE-CAPILLARY: 103 mg/dL — AB (ref 70–99)
Glucose-Capillary: 132 mg/dL — ABNORMAL HIGH (ref 70–99)
Glucose-Capillary: 114 mg/dL — ABNORMAL HIGH (ref 70–99)

## 2018-04-05 LAB — BASIC METABOLIC PANEL
Anion gap: 14 (ref 5–15)
BUN: 19 mg/dL (ref 6–20)
CHLORIDE: 102 mmol/L (ref 98–111)
CO2: 27 mmol/L (ref 22–32)
CREATININE: 0.88 mg/dL (ref 0.44–1.00)
Calcium: 9 mg/dL (ref 8.9–10.3)
GFR calc Af Amer: >60 mL/min (ref >60)
GFR calc non Af Amer: >60 mL/min (ref >60)
Glucose, Bld: 120 mg/dL — ABNORMAL HIGH (ref 70–99)
POTASSIUM: 2.8 mmol/L — AB (ref 3.5–5.1)
SODIUM: 143 mmol/L (ref 135–145)

## 2018-04-05 MED ORDER — CARVEDILOL 12.5 MG PO TABS
25.0000 mg | ORAL_TABLET | Freq: Two times a day (BID) | ORAL | Status: DC
  Administered 2018-04-05 – 2018-04-09 (×8): 25 mg via ORAL
  Filled 2018-04-05 (×9): qty 2

## 2018-04-05 MED ORDER — LEVETIRACETAM 100 MG/ML PO SOLN
1000.0000 mg | Freq: Two times a day (BID) | ORAL | Status: DC
Start: 2018-04-05 — End: 2018-04-09
  Administered 2018-04-05 – 2018-04-08 (×7): 1000 mg via ORAL
  Filled 2018-04-05 (×7): qty 10

## 2018-04-05 MED ORDER — AMLODIPINE BESYLATE 10 MG PO TABS
10.0000 mg | ORAL_TABLET | Freq: Every day | ORAL | Status: DC
  Administered 2018-04-06 – 2018-04-09 (×4): 10 mg via ORAL
  Filled 2018-04-05 (×4): qty 1

## 2018-04-05 MED ORDER — INSULIN ASPART 100 UNIT/ML ~~LOC~~ SOLN
0.0000 [IU] | Freq: Three times a day (TID) | SUBCUTANEOUS | Status: DC
  Administered 2018-04-06 – 2018-04-08 (×6): 2 [IU] via SUBCUTANEOUS

## 2018-04-05 MED ORDER — POTASSIUM CHLORIDE 10 MEQ/50ML IV SOLN
INTRAVENOUS | Status: AC
  Filled 2018-04-05: qty 100

## 2018-04-05 MED ORDER — INSULIN ASPART 100 UNIT/ML ~~LOC~~ SOLN: 0.0000 [IU] | Freq: Every day | SUBCUTANEOUS | Status: DC

## 2018-04-05 MED ORDER — CHLORHEXIDINE GLUCONATE 0.12 % MT SOLN
OROMUCOSAL | Status: AC
  Filled 2018-04-05: qty 15

## 2018-04-05 MED ORDER — LABETALOL HCL 100 MG PO TABS
100.0000 mg | ORAL_TABLET | Freq: Three times a day (TID) | ORAL | Status: DC
  Administered 2018-04-05 – 2018-04-09 (×13): 100 mg via ORAL
  Filled 2018-04-05 (×13): qty 1

## 2018-04-05 MED ORDER — POTASSIUM CHLORIDE 10 MEQ/50ML IV SOLN
10.0000 meq | INTRAVENOUS | Status: DC
  Administered 2018-04-05 (×8): 10 meq via INTRAVENOUS
  Filled 2018-04-05 (×6): qty 50

## 2018-04-05 NOTE — Progress Notes (Signed)
  NEUROSURGERY PROGRESS NOTE   Pt seen and examined. No issues overnight. Reports feeling "ok."  EXAM: Temp:  [97.9 F (36.6 C)-100.4 F (38 C)] 97.9 F (36.6 C) (09/23 0416) Pulse Rate:  [66-111] 108 (09/23 0906) BP: (130-203)/(52-84) 170/80 (09/23 0906) SpO2:  [94 %-99 %] 98 % (09/23 0400) Intake/Output      09/22 0701 - 09/23 0700 09/23 0701 - 09/24 0700   I.V. (mL/kg) 147 (1.4)    IV Piggyback 709.8    Total Intake(mL/kg) 856.8 (8.4)    Urine (mL/kg/hr) 0 (0)    Stool 450    Total Output 450    Net +406.8         Urine Occurrence 1 x     Awake, alert Speech fluent CN grossly intact, hoarseness of voice Symmetric strength, generalized weakness  LABS: Lab Results  Component Value Date   CREATININE 0.88 04/05/2018   BUN 19 04/05/2018   NA 143 04/05/2018   K 2.8 (L) 04/05/2018   CL 102 04/05/2018   CO2 27 04/05/2018   Lab Results  Component Value Date   WBC 10.1 04/05/2018   HGB 8.2 (L) 04/05/2018   HCT 26.9 (L) 04/05/2018   MCV 99.3 04/05/2018   PLT 266 04/05/2018    IMPRESSION: - 62 y.o. female SAH d#13 s/p Left PICA aneurysm coiling, neurologically intact - VDRF resolved  PLAN: - Cont SLP, hopefully will progress with diet - Cont PT/OT, suspect she would be a good CIR candidate.

## 2018-04-05 NOTE — Progress Notes (Signed)
[facility] ADULT ICU REPLACEMENT PROTOCOL FOR AM LAB REPLACEMENT ONLY  The patient does apply for the [facility] Adult ICU Electrolyte Replacment Protocol based on the criteria listed below:   1. Is GFR >/= 40 ml/min? Yes.    Patient's GFR today is >60 2. Is urine output >/= 0.5 ml/kg/hr for the last 6 hours? Yes.   Patient's UOP is 0.7 ml/kg/hr 3. Is BUN < 60 mg/dL? Yes.    Patient's BUN today is 19 4. Abnormal electrolyte(s): K 2.8 5. Ordered repletion with: protocol 6. If a panic level lab has been reported, has the CCM MD in charge been notified? No..   Physician:    Ronda Fairly A 04/05/2018 6:52 AM

## 2018-04-05 NOTE — Progress Notes (Signed)
CRITICAL VALUE ALERT  Critical Value:  2.8 K+  Date & Time Notied:  9/23 1021  Provider Notified: Elzie Rings RN at [facility]  Orders Received/Actions taken: IV Potassium replacement ordered

## 2018-04-05 NOTE — Progress Notes (Signed)
Nutrition Follow-up  DOCUMENTATION CODES:   Obesity unspecified  INTERVENTION:   Magic cup TID with meals, each supplement provides 290 kcal and 9 grams of protein  NUTRITION DIAGNOSIS:   Inadequate oral intake related to dysphagia as evidenced by meal completion < 50%. Ongoing.   GOAL:   Patient will meet greater than or equal to 90% of their needs Progressing.   MONITOR:   PO intake, Diet advancement, Supplement acceptance  ASSESSMENT:   62 yo female with PMH of HTN, GERD, hiatal hernia, breast cancer with brain mets (receiving chemo & XRT), and osteonecrosis of R jaw bone who was admitted on 9/9 with SAH.  Pt discussed during ICU rounds and with RN.  Per RN will try diet and not place feeding tube for now  9/21 extubated 9/23 pt out of room for swallow eval   Medications reviewed and include: KCl Labs reviewed: K+ 2.8 (L)   Diet Order:   Diet Order            DIET - DYS 1 Room service appropriate? Yes; Fluid consistency: Honey Thick  Diet effective now              EDUCATION NEEDS:   No education needs have been identified at this time  Skin:  Skin Assessment: Reviewed RN Assessment  Last BM:  450 ml x 24 hr via rectal pouch  Height:   Ht Readings from Last 1 Encounters:  03/24/18 5\' 4"  (1.626 m)    Weight:   Wt Readings from Last 1 Encounters:  03/23/18 102 kg    Ideal Body Weight:  54.5 kg  BMI:  Body mass index is 38.6 kg/m.  Estimated Nutritional Needs:   Kcal:  1850-2000  Protein:  90-110 grams  Fluid:  > 1.8 L/day  Maylon Peppers RD, LDN, CNSC [phone] Pager [phone] After Hours Pager

## 2018-04-05 NOTE — Progress Notes (Addendum)
Modified Barium Swallow Progress Note  Patient Details  Name: Diane Short MRN: [id number] Date of Birth: May 11, 1956  Today's Date: 04/05/2018  Modified Barium Swallow completed.  Full report located under Chart Review in the Imaging Section.  Brief recommendations include the following:  Clinical Impression  Pt demonstrates a moderate post extubation related dysphagia complicated by cognitive deficits. Pt needs total hand over hand assist to self feed due to weakness, verbal cues to take straw sips, verbal cues to initaite swallow. She orally holds most boluses for several seconds with good oral containment but intermittent slight delay in swallow initiation. With larger boluses of nectar this does result in trace sensed aspiration events before/during the swallow, apparently via the interarytenoid space where some glottic incompetence may be present after intubation. Study was quite limited as pt appeared to become nauseated. Pt is recommended to initiate a conservative diet of puree and and honey thick liquids with upgrade at bedside given consistent sensation on exam. Be aware of potential for esophageal dysphagia, pt did have one instance of trace backflow that she sensed and swallowed.   Swallow Evaluation Recommendations       SLP Diet Recommendations: Dysphagia 1 (Puree) solids;Honey thick liquids   Liquid Administration via: Cup;Straw   Medication Administration: Crushed with puree   Supervision: Full assist for feeding   Compensations: Slow rate;Small sips/bites   Postural Changes: Seated upright at 90 degrees;Remain semi-upright after after feeds/meals (Comment)   Oral Care Recommendations: Oral care BID   Other Recommendations: Order thickener from [place], [place] CCC-SLP [phone]  Lynann Beaver 04/05/2018,12:19 PM

## 2018-04-05 NOTE — Progress Notes (Signed)
Transcranial Doppler  Date POD PCO2 HCT BP  MCA ACA PCA OPHT SIPH VERT Basilar  03/24/18 vs     Right  Left   92  13   -58  -52   19  *   28  *   137  *   93  *   90      9/13 MS     Right  Left   124  34   -70  *   -39  *   *  12   *  -26   -36  -53   -61      9/16 MS     Right  Left   64  17   -51  *   -24  *   22  14   42  42   -30  -30     -30    9/18 JE      Right  Left   47  *   -31  *   16  *   41  18   49  34   *  *   *      9/20 GC      Right  Left   73  *   -30  *   52  *   23  13   68  85   *  *   *      9/23 VS     Right  Left   52  *   -45  *   41  *   30  *   31  *   *  *   *  *         Right  Left                                        MCA = Middle Cerebral Artery      OPHT = Opthalmic Artery     BASILAR = Basilar Artery   ACA = Anterior Cerebral Artery     SIPH = Carotid Siphon PCA = Posterior Cerebral Artery   VERT = Verterbral Artery                   Normal MCA = 62+\-12 ACA = 50+\-12 PCA = 42+\-23   03/24/2018 * Not found Lindegaard ratio - Rt 0.99 Lt 0.12  03/26/18 *Unable to insonate Lindegaard ratio- Rt 2.95 Lt 0.55  03/29/18 *Unable to insonate Lindegaard ratio- Rt 2.29 Lt 0.40  9/18 * unable to insonate. Technically difficult due to patient constantly moving head. je  9/20 * Unable to insonate. Technically difficult due to patient movement. GC Right Lindegaard ratio 1.71  9/23 * Unable to insonate due to constant movement, and vocal interference. Right Lindegaard ratio  1.21  04/05/2018 2:33 PM  [facility], [place]

## 2018-04-05 NOTE — Progress Notes (Signed)
PULMONARY / CRITICAL CARE MEDICINE   NAME:  Diane Short, MRN:  [id number], DOB:  March 28, 1956, LOS: 52 ADMISSION DATE:  03/22/2018, CONSULTATION DATE: 03/22/2018 REFERRING MD:  Dr. Leonette Monarch, ER, CHIEF COMPLAINT:  Headache  BRIEF HISTORY:    62 yo female smoker with hx of cerebral aneurysms s/p intervention presented with persistent headache, and altered mental status.  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  Intubated for airway protection.  Nimodipine started.  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  Extubated 9/21.  SIGNIFICANT PAST MEDICAL HISTORY   Stage 4 Breast cancer, Neuropathy, HTN, Hiatal hernia, GERD, Depression  SIGNIFICANT EVENTS:  9/09 Admit 9/10 cerebral angiogram with coiling 9/11 extubated >> reintubated due to stridor and start steroids; start ABx for UTI, aspiration 9/13 wheezing, respiratory acidosis >> paralytic x 1, deep sedation. STarted on nimbex and solumedrol 9/14-  on nimbex since yesterday due to vent dynschorny. On fent, versed and diprivan. BIS 40-60. Pulse ox 89%, 45% fio2, peep - 5, On Cardene  For goal sbp < 160. Currently 135. On tube feeds. Lot of 3rd spacing.  +. Rt hand bruising - worse per RN but pulse rt radial, rt ulnar and cap refill on right hand. Started lasix 9/16 remains paralyzed, diuresed, attempt to get paralytics off today 9/17 diuresed very well overnight but Na increased, tolerated nimbex off, minimizing sedation and holding diureses for today, decrease PEEP and FiO2 to 5 and 30%. 9/20 diuresed well and beginning active weaning 9/21 Net negative 8 L, weaning well on 5/5>> Extubated 9/22 failed SLP, off precedex   STUDIES:   CT head 9/09 >> SAH, mild hydrocephalus EEG 9/09 >> slowing CT head 9/10 >> stable SAH and IVH Echo 9/10 >> left ventricular EF 55 to 60%.  Grade 1 diastolic dysfunction. MBS 9/23 >>>  CULTURES:  Urine 9/09 >> E coli Sputum 9/09 >> oral flora Blood 9/09 >> No growth 9/20 Blood>>  > Urine 9/20>> No growth Sputum 9/20>> normal flora  ANTIBIOTICS:  Zosyn 9/11 >> 9/13 Rocephin 9/13 >> 9/17 Unasyn 9/22  LINES/TUBES:  Rt port >> ETT 9/09 >> 9/11 ETT 9/11 >> 9/21 Rt radial aline 9/13 >> out  CONSULTANTS:  Neurosurgery 9/09 >>  SUBJECTIVE:  Afebrile Net -7.8L/ +621 ml/24 hr Pending MBS today  RN reports non-bloody mucous stool, remains alert, oriented and on room air  CONSTITUTIONAL: BP (!) 170/80   Pulse (!) 108   Temp 97.9 F (36.6 C) (Axillary)   Resp 14   Ht '5\' 4"'  (1.626 m)   Wt 102 kg   LMP 08/14/2010 (Exact Date)   SpO2 97%   BMI 38.60 kg/m   I/O last 3 completed shifts: In: 1944.7 [I.V.:666; IV Piggyback:1278.7] Out: 1325 [Urine:875; Stool:450]     PHYSICAL EXAM: General:  Adult female sitting up in bed in NAD, appears frustrated at times HEENT: [place]/AT, MM pink/moist, pupils 4/reactive, no neck, no stridor Neuro: Alert, oriented to self and month, f/c, weakly MAE, soft phonation, strong NP cough when prompted CV: rrr, no m/r/g PULM: even/non-labored, lungs bilaterally clear, minimally diminished in bases, on RA at > 97% GI: obese, soft, non-tender, bs active  Extremities: warm/dry, no BLE edema  Skin: no rashes, scattered bruises  LABS   PULMONARY Recent Labs  Lab 03/30/18 0403 03/31/18 0338 04/02/18 0355 04/03/18 0310  PHART 7.449 7.517* 7.522* 7.552*  PCO2ART 46.5 36.7 40.9 40.3  PO2ART 81.0* 71.0* 79.4* 94.0  HCO3 32.2* 29.7*  33.4* 35.4*  TCO2 34* 31  --  37*  O2SAT 96.0 96.0 96.8 98.0   CBC Recent Labs  Lab 04/03/18 0510 04/04/18 0506 04/05/18 0525  HGB 8.2* 8.3* 8.2*  HCT 26.0* 26.7* 26.9*  WBC 11.0* 12.4* 10.1  PLT 186 224 266   COAGULATION No results for input(s): INR in the last 168 hours. CARDIAC  No results for input(s): TROPONINI in the last 168 hours. No results for input(s): PROBNP in the last 168 hours.  CHEMISTRY Recent Labs  Lab 03/30/18 0341 03/31/18 0325  04/01/18 0435 04/02/18 0500   04/03/18 0510 04/03/18 2238 04/04/18 0506 04/04/18 2148 04/05/18 0525  NA 160* 156*   < > 155* 146*   < > 136 139 139 138 143  K 3.4* 2.8*   < > 3.4* 3.1*   < > 2.8* 2.3* 2.4* 2.2* 2.8*  CL 114* 116*   < > 117* 102   < > 85* 91* 92* 101 102  CO2 28 29   < > 28 30   < > 34* 33* '30 27 27  ' GLUCOSE 165* 155*   < > 157* 225*   < > 212* 134* 112* 140* 120*  BUN 68* 56*   < > 34* 33*   < > 35* 33* 32* 22* 19  CREATININE 0.97 0.81   < > 0.63 0.84   < > 0.94 0.84 0.93 0.87 0.88  CALCIUM 9.2 9.0   < > 8.9 8.9   < > 9.1 9.0 9.1 8.9 9.0  MG 2.4 2.3  --  2.2 1.6*  --  2.0  --   --   --   --   PHOS 6.0* 4.6  --  3.2 3.6  --  5.4*  --   --   --   --    < > = values in this interval not displayed.   Estimated Creatinine Clearance: 79 mL/min (by C-G formula based on SCr of 0.88 mg/dL).  LIVER Recent Labs  Lab 04/03/18 0510  AST 29  ALT 40  ALKPHOS 85  BILITOT 0.7  PROT 6.2*  ALBUMIN 2.6*   INFECTIOUS Recent Labs  Lab 04/02/18 1026 04/03/18 0510 04/04/18 0506  PROCALCITON 0.26 0.36 0.44   ENDOCRINE CBG (last 3)  Recent Labs    04/04/18 2334 04/05/18 0419 04/05/18 0753  GLUCAP 129* 108* 132*   IMAGING x48h  - image(s) personally visualized  -   highlighted in bold Dg Chest Port 1 View  Result Date: 04/05/2018 CLINICAL DATA:  Respiratory failure.  Breast cancer.  Hypertension. EXAM: PORTABLE CHEST 1 VIEW COMPARISON:  04/04/2018 FINDINGS: Right internal jugular and PICC lines are unchanged in position. Patient rotated minimally right. Midline trachea. Normal heart size. No pleural effusion or pneumothorax. Biapical pleural thickening. Areas of subsegmental atelectasis at both lung bases. IMPRESSION: No acute cardiopulmonary disease. Similar subsegmental atelectasis in both lung bases. Electronically Signed   By: Abigail Miyamoto M.D.   On: 04/05/2018 08:05   Dg Chest Port 1 View  Result Date: 04/04/2018 CLINICAL DATA:  Respiratory failure EXAM: PORTABLE CHEST 1 VIEW COMPARISON:   04/03/2018 FINDINGS: Interval extubation. Mild patchy/platelike right lower lobe opacity, likely atelectasis. Left basilar opacity, likely atelectasis. No frank interstitial edema. No pleural effusion or pneumothorax. Heart is normal in size. Right arm PICC terminates at the cavoatrial junction. Right chest power port terminates in the mid SVC. IMPRESSION: Interval extubation. Mild patchy/platelike lower lobe opacities, likely atelectasis. Electronically Signed   By: Bertis Ruddy  [place] Pink M.D.   On: 04/04/2018 07:54    ASSESSMENT AND PLAN   History of tobacco abuse Acute respiratory failure with hypoxia, hypercapnia Postextubation stridor 9/11.-  with acute bronchospasm 9/13 and reintubated.  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 POD #13 - Continue Nimotop per NSGY - TCDs per NSGY - ongoing neuro exams - Keppra for seizure prophylaxis - SBP goal < 180 - Norvasc 10 mg PO, coreg 84m BID, labetalol 10455mTID, catapress 0.55m37mpatch - apresoline / lopressor PRN - If ok per NSGY, will tx to SDU  Acute metabolic encephalopathy. - resolved  - d/c sedation  Volume overload - resolved Contraction alkalosis 2/2 diuresis; lasix gtt stopped 9/21 Hypokalemia  - s/p KCL replete this am  - monitor I/O's, daily wts - Replace electrolytes as indicated - renal panel/ mag in am  Aspiration pneumonia vs bilateral atelectasis  - PCT uptrending 9/20- 9/22 w/ leukocyostsis; unasyn started 9/22  - WBC improving, afebrile P:  Continue unasyn for now PCT in am, consider changing to Augmentin if needed  Monitor fever curve/ WBC Remains high aspiration risk  E coli UTI.  Completed  Rocephin on  9/17 - UC 9/20 neg; follow BCs -  Trend PCT   Dysphagia - failed SLP 9/22, NPO except meds with puree thickened pudding Plan: Appreciate SLP; pending for MBS today  If fails, will need cortrak Ongoing aspiration precautions  Hx of HTN - has been getting her PO meds P:  Continue HTN meds as above   Stage 4 breast cancer s/p mastectomy, XRT to brain met, and recently on chemo. Chemo induced anemia, thrombocytopenia.  Followed by oncology in [employer] - Tend CBC  Hyperglycemia. - SSI- resistant - CBGs q 4  Insomnia  - continue prn ativan HS, 0.5mg59mSUMMARY OF TODAY'S PLAN:  Going for MBS today, if fails will need cortrak for ongoing nutrition/ meds Afebrile, improved WBC Remains on RA, alert No complaints per patient.  Asking for ice chips and to get up out of bed.   Best Practice / Goals of Care / Disposition.   DVT PROPHYLAXIS: SCDs/ lovenox SUP: protonix NUTRITION: pending MBS, currently NPO except meds MOBILITY: progress with PT/OT GOALS OF CARE: full code FAMILY DISCUSSIONS:  Updated family at bedside 9/13. Husband updated 03/27/18, Husband 03/28/18, Husband 9/22.  Pt, husband and daughter updated at bedside 9/23.    BrooKennieth RadACNP-BC [facility] Pgr: [phone] no answer [phone]/2019, 10:41 AM

## 2018-04-06 LAB — GLUCOSE, CAPILLARY
GLUCOSE-CAPILLARY: 126 mg/dL — AB (ref 70–99)
GLUCOSE-CAPILLARY: 141 mg/dL — AB (ref 70–99)
GLUCOSE-CAPILLARY: 110 mg/dL — AB (ref 70–99)

## 2018-04-06 LAB — RENAL FUNCTION PANEL
Albumin: 2.7 g/dL — ABNORMAL LOW (ref 3.5–5.0)
Anion gap: 12 (ref 5–15)
BUN: 14 mg/dL (ref 6–20)
CHLORIDE: 114 mmol/L — AB (ref 98–111)
CO2: 24 mmol/L (ref 22–32)
CREATININE: 0.79 mg/dL (ref 0.44–1.00)
Calcium: 8.8 mg/dL — ABNORMAL LOW (ref 8.9–10.3)
GFR calc Af Amer: >60 mL/min (ref >60)
GFR calc non Af Amer: >60 mL/min (ref >60)
Glucose, Bld: 156 mg/dL — ABNORMAL HIGH (ref 70–99)
POTASSIUM: 2.9 mmol/L — AB (ref 3.5–5.1)
Phosphorus: 3.1 mg/dL (ref 2.5–4.6)
Sodium: 150 mmol/L — ABNORMAL HIGH (ref 135–145)

## 2018-04-06 LAB — CBC
HCT: 27.7 % — ABNORMAL LOW (ref 36.0–46.0)
Hemoglobin: 8.2 g/dL — ABNORMAL LOW (ref 12.0–15.0)
MCH: 30.5 pg (ref 26.0–34.0)
MCHC: 29.6 g/dL — ABNORMAL LOW (ref 30.0–36.0)
MCV: 103 fL — AB (ref 78.0–100.0)
PLATELETS: 304 10*3/uL (ref 150–400)
RBC: 2.69 MIL/uL — ABNORMAL LOW (ref 3.87–5.11)
RDW: 19.9 % — AB (ref 11.5–15.5)
WBC: 7.2 10*3/uL (ref 4.0–10.5)

## 2018-04-06 LAB — MAGNESIUM: Magnesium: 2.2 mg/dL (ref 1.7–2.4)

## 2018-04-06 MED ORDER — FAMOTIDINE 20 MG PO TABS
20.0000 mg | ORAL_TABLET | Freq: Every day | ORAL | Status: DC
  Administered 2018-04-06 – 2018-04-09 (×4): 20 mg via ORAL
  Filled 2018-04-06 (×4): qty 1

## 2018-04-06 MED ORDER — POTASSIUM CHLORIDE CRYS ER 20 MEQ PO TBCR
40.0000 meq | EXTENDED_RELEASE_TABLET | Freq: Two times a day (BID) | ORAL | Status: AC
  Administered 2018-04-06 (×2): 40 meq via ORAL
  Filled 2018-04-06 (×2): qty 2

## 2018-04-06 MED ORDER — LOPERAMIDE HCL 2 MG PO CAPS
2.0000 mg | ORAL_CAPSULE | Freq: Three times a day (TID) | ORAL | Status: DC | PRN
  Administered 2018-04-06 – 2018-04-09 (×3): 2 mg via ORAL
  Filled 2018-04-06 (×3): qty 1

## 2018-04-06 NOTE — Evaluation (Signed)
Physical Therapy Evaluation Patient Details Name: Diane Short MRN: [id number] DOB: 1956-04-09 Today's Date: 04/06/2018   History of Present Illness  62 yo female hx smoking, stage 4 breast cancer with brain met tx with chemo and brain XRT, cerebral aneurysms s/p intervention presented with persistent headache, and altered mental status.  Found to have stable intraventricular hemorrhage as well as subarachnoid hemorrhage concentrated in left prepontine cistern and left CP angle cistern.  Intubated 9/9-9/11 and re-intubated 9/11-9/21. CXR 9/22 mild patchy/platelike lower lobe opacities, likely atelectasis.  Clinical Impression  Pt admitted with/for AMS and found to hand IVH.  Pt needing significant assist at this time as she is extremely weak from many days of inactivity..  Pt currently limited functionally due to the problems listed. ( See problems list.)   Pt will benefit from PT to maximize function and safety in order to get ready for next venue listed below.     Follow Up Recommendations CIR;Supervision/Assistance - 24 hour    Equipment Recommendations  Other (comment)(TBA)    Recommendations for Other Services Rehab consult     Precautions / Restrictions Precautions Precautions: Fall      Mobility  Bed Mobility Overal bed mobility: Needs Assistance Bed Mobility: Supine to Sit;Sit to Supine     Supine to sit: Max assist;+2 for physical assistance Sit to supine: Max assist;+2 for physical assistance   General bed mobility comments: Pt initiates movement for bed mobility;   Transfers Overall transfer level: Needs assistance Equipment used: 2 person hand held assist Transfers: Sit to/from Stand Sit to Stand: +2 physical assistance;Max assist         General transfer comment: gati belt and bed pad used  Ambulation/Gait             General Gait Details: not able  Stairs            Wheelchair Mobility    Modified Rankin (Stroke Patients  Only) Modified Rankin (Stroke Patients Only) Pre-Morbid Rankin Score: No symptoms Modified Rankin: Severe disability     Balance Overall balance assessment: Needs assistance Sitting-balance support: Bilateral upper extremity supported;Single extremity supported;Feet supported Sitting balance-Leahy Scale: Poor Sitting balance - Comments: tending to list posteriorly, needing assist to come forward to midline   Standing balance support: Bilateral upper extremity supported Standing balance-Leahy Scale: Poor Standing balance comment: significant truncal assist given , but pt unable to fully extend her hips.                             Pertinent Vitals/Pain Pain Assessment: Faces Faces Pain Scale: No hurt    Home Living Family/patient expects to be discharged to:: Private residence Living Arrangements: Spouse/significant other Available Help at Discharge: Family;Available 24 hours/day Type of Home: House Home Access: Level entry     Home Layout: One level;Other (Comment)(1 step to den/kitchen) Home Equipment: Grab bars - tub/shower      Prior Function Level of Independence: Independent         Comments: drove; husband occasionally helpes with drying off legs due to unsteadiness     Hand Dominance   Dominant Hand: Right    Extremity/Trunk Assessment   Upper Extremity Assessment RUE Deficits / Details: unable to complete hand to mouth movement due to weakness; gross grasp/release; absent in-hand manipulation skills RUE Sensation: decreased light touch RUE Coordination: decreased fine motor;decreased gross motor LUE Deficits / Details: LUE stronger than RUE but unable to complete hand  to mouth pattern; gross grasp/release LUE Sensation: decreased light touch LUE Coordination: decreased gross motor;decreased fine motor    Lower Extremity Assessment Lower Extremity Assessment: Generalized weakness;RLE deficits/detail;LLE deficits/detail RLE Deficits /  Details: grossly 3-/5, quads locking out in standing. RLE Coordination: decreased fine motor LLE Deficits / Details: grossly 3-/5, quads 2+ LLE Coordination: decreased fine motor    Cervical / Trunk Assessment Cervical / Trunk Assessment: Other exceptions Cervical / Trunk Exceptions: posterior bias  Communication   Communication: Expressive difficulties  Cognition Arousal/Alertness: Awake/alert Behavior During Therapy: Flat affect Overall Cognitive Status: Impaired/Different from baseline Area of Impairment: Orientation;Attention;Memory;Following commands;Safety/judgement;Awareness;Problem solving                 Orientation Level: Disoriented to;Place([facility]) Current Attention Level: Sustained Memory: Decreased recall of precautions;Decreased short-term memory Following Commands: Follows one step commands with increased time Safety/Judgement: Decreased awareness of safety;Decreased awareness of deficits Awareness: Intellectual Problem Solving: Slow processing;Decreased initiation;Difficulty sequencing;Requires verbal cues;Requires tactile cues        General Comments      Exercises Other Exercises Other Exercises: warm up hip/knee ROM bil.   Assessment/Plan    PT Assessment Patient needs continued PT services  PT Problem List Decreased strength;Decreased activity tolerance;Decreased balance;Decreased mobility;Decreased coordination;Decreased safety awareness       PT Treatment Interventions Gait training;Functional mobility training;Therapeutic activities;Balance training;Neuromuscular re-education;Patient/family education    PT Goals (Current goals can be found in the Care Plan section)  Acute Rehab PT Goals Patient Stated Goal: to get stronger PT Goal Formulation: With patient Time For Goal Achievement: 04/20/18 Potential to Achieve Goals: Fair    Frequency Min 3X/week   Barriers to discharge        Co-evaluation PT/OT/SLP Co-Evaluation/Treatment:  Yes Reason for Co-Treatment: Complexity of the patient's impairments (multi-system involvement) PT goals addressed during session: Mobility/safety with mobility OT goals addressed during session: ADL's and self-care       AM-PAC PT "6 Clicks" Daily Activity  Outcome Measure Difficulty turning over in bed (including adjusting bedclothes, sheets and blankets)?: Unable Difficulty moving from lying on back to sitting on the side of the bed? : Unable Difficulty sitting down on and standing up from a chair with arms (e.g., wheelchair, bedside commode, etc,.)?: Unable Help needed moving to and from a bed to chair (including a wheelchair)?: Total Help needed walking in hospital room?: Total Help needed climbing 3-5 steps with a railing? : Total 6 Click Score: 6    End of Session   Activity Tolerance: Patient limited by fatigue Patient left: in bed;with call bell/phone within reach;with bed alarm set;with SCD's reapplied Nurse Communication: Mobility status;Need for lift equipment PT Visit Diagnosis: Hemiplegia and hemiparesis;Other abnormalities of gait and mobility (R26.89);Muscle weakness (generalized) (M62.81) Hemiplegia - Right/Left: Right Hemiplegia - caused by: Nontraumatic intracerebral hemorrhage    Time: 7681-1572 PT Time Calculation (min) (ACUTE ONLY): 37 min   Charges:   PT Evaluation $PT Eval Moderate Complexity: 1 Mod          04/06/2018  Donnella Sham, PT Acute Rehabilitation Services [phone]  (pager) [phone]  (office)  Tessie Fass Dealva Lafoy 04/06/2018, 6:10 PM

## 2018-04-06 NOTE — Progress Notes (Signed)
Rehab Admissions Coordinator Note:  Patient was screened by Cleatrice Burke for appropriateness for an Inpatient Acute Rehab Consult per OT and SLP recommendations.  At this time, we are recommending Inpatient Rehab consult.  Cleatrice Burke 04/06/2018, 6:08 PM  I can be reached at [phone].

## 2018-04-06 NOTE — Progress Notes (Signed)
  Speech Language Pathology Treatment: Dysphagia  Patient Details Name: Diane Short MRN: [id number] DOB: [birth year] Today's Date: 04/06/2018 Time: 4680-3212 SLP Time Calculation (min) (ACUTE ONLY): 23 min  Assessment / Plan / Recommendation Clinical Impression  Pt demonstrates ongoing cognitive deficits impacting automaticity, awareness, attention. Needs ongoing verbal cues to intiaite swallow. When taking rapid straw sips, this was followed by prolonged coughing. One sip a t atime froma  Cup much better tolerated. No evidence to support a chin tuck with this pt as she did not tolerate it in testing due to nausea. Discussed strategies with family. Will continue efforts.   HPI HPI: 62 yo female hx smoking, stage 4 breast cancer with brain met tx with chemo and brain XRT, cerebral aneurysms s/p intervention presented with persistent headache, and altered mental status. Found to have stable intraventricular hemorrhage as well as subarachnoid hemorrhage concentrated in left prepontine cistern and left CP angle cistern. Intubated 9/9-9/11 and re-intubated 9/11-9/21. CXR 9/22 mild patchy/platelike lower lobe opacities, likely atelectasis. Multiple attempts at NGT unsuccessful and pt has crucial po medication.        SLP Plan  Continue with current plan of care       Recommendations  Diet recommendations: Honey-thick liquid Liquids provided via: Cup Medication Administration: Crushed with puree Supervision: Patient able to self feed Compensations: Slow rate;Small sips/bites Postural Changes and/or Swallow Maneuvers: Seated upright 90 degrees                Follow up Recommendations: Inpatient Rehab SLP Visit Diagnosis: Dysphagia, oropharyngeal phase (R13.12) Plan: Continue with current plan of care       GO               [facility], [place] CCC-SLP [phone]  Lynann Beaver 04/06/2018, 11:01 AM

## 2018-04-06 NOTE — Progress Notes (Addendum)
PULMONARY / CRITICAL CARE MEDICINE   NAME:  Diane Short, MRN:  [id number], DOB:  [birth year], LOS: 36 ADMISSION DATE:  03/22/2018, CONSULTATION DATE: 03/22/2018 REFERRING MD:  Dr. Leonette Monarch, ER, CHIEF COMPLAINT:  Headache  BRIEF HISTORY:    62 yo female smoker with hx of cerebral aneurysms s/p intervention presented with persistent headache, and altered mental status.  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  Intubated for airway protection.  Nimodipine started.  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  Extubated 9/21.  SIGNIFICANT PAST MEDICAL HISTORY   Stage 4 Breast cancer, Neuropathy, HTN, Hiatal hernia, GERD, Depression  SIGNIFICANT EVENTS:  9/09 Admit 9/10 cerebral angiogram with coiling 9/11 extubated >> reintubated due to stridor and start steroids; start ABx for UTI, aspiration 9/13 wheezing, respiratory acidosis >> paralytic x 1, deep sedation. STarted on nimbex and solumedrol 9/14-  on nimbex since yesterday due to vent dynschorny. On fent, versed and diprivan. BIS 40-60. Pulse ox 89%, 45% fio2, peep - 5, On Cardene  For goal sbp < 160. Currently 135. On tube feeds. Lot of 3rd spacing.  +. Rt hand bruising - worse per RN but pulse rt radial, rt ulnar and cap refill on right hand. Started lasix 9/16 remains paralyzed, diuresed, attempt to get paralytics off today 9/17 diuresed very well overnight but Na increased, tolerated nimbex off, minimizing sedation and holding diureses for today, decrease PEEP and FiO2 to 5 and 30%. 9/20 diuresed well and beginning active weaning 9/21 Net negative 8 L, weaning well on 5/5>> Extubated 9/22 failed SLP, off precedex  9/23 MBS-> dys 1 diet; urinary retention  STUDIES:   CT head 9/09 >> SAH, mild hydrocephalus EEG 9/09 >> slowing CT head 9/10 >> stable SAH and IVH Echo 9/10 >> left ventricular EF 55 to 60%.  Grade 1 diastolic dysfunction. MBS 9/23 >> mild aspiration risk; dys 1 diet, potential for esophageal  dysphagia  CULTURES:  Urine 9/09 >> E coli Sputum 9/09 >> oral flora Blood 9/09 >> No growth 9/20 Blood>> > Urine 9/20>> No growth Sputum 9/20>> normal flora  ANTIBIOTICS:  Zosyn 9/11 >> 9/13 Rocephin 9/13 >> 9/17 Unasyn 9/22 >> 9/24  LINES/TUBES:  Rt port >> ETT 9/09 >> 9/11 ETT 9/11 >> 9/21 Rt radial aline 9/13 >> out R PICC   CONSULTANTS:  Neurosurgery 9/09 >>  SUBJECTIVE:  Acute urinary retention, up to 125m once, requiring in/out cath x 2 Ongoing non-bloody loose stools with mucous- too thick for flexiseal, starting to cause breakdown Remains afebrile, WBC 7.2, on room air Husband thinks is more confused than yesterday, and concerned about tremors   CONSTITUTIONAL: BP (!) 176/75   Pulse 91   Temp 98.4 F (36.9 C) (Oral)   Resp 14   Ht '5\' 4"'  (1.626 m)   Wt 102 kg   LMP 08/14/2010 (Exact Date)   SpO2 99%   BMI 38.60 kg/m   I/O last 3 completed shifts: In: 1269.5 [IV Piggyback:1269.5] Out: 1200 [Urine:1200]     PHYSICAL EXAM: General:  Adult female lying in bed in NAD HEENT: MM pink/moist, pupils 3/reactive, soft phonation Neuro: Alert, oriented x 2, MAE weakly, some tremor with movement noted in all extremities while patient awake CV: s1s2 rrr, no m/r/g PULM: even/non-labored, lungs bilaterally clear GYB:WLSL obese, non-tender, bs hyperactive  Extremities: warm/dry, no edema  Skin: no rashes, scattered bruising, healing abrasions to knees   LABS   PULMONARY Recent Labs  Lab  03/31/18 0338 04/02/18 0355 04/03/18 0310  PHART 7.517* 7.522* 7.552*  PCO2ART 36.7 40.9 40.3  PO2ART 71.0* 79.4* 94.0  HCO3 29.7* 33.4* 35.4*  TCO2 31  --  37*  O2SAT 96.0 96.8 98.0   CBC Recent Labs  Lab 04/04/18 0506 04/05/18 0525 04/06/18 0508  HGB 8.3* 8.2* 8.2*  HCT 26.7* 26.9* 27.7*  WBC 12.4* 10.1 7.2  PLT 224 266 304   COAGULATION No results for input(s): INR in the last 168 hours. CARDIAC  No results for input(s): TROPONINI in the last 168  hours. No results for input(s): PROBNP in the last 168 hours.  CHEMISTRY Recent Labs  Lab 03/31/18 0325  04/01/18 0435 04/02/18 0500  04/03/18 0510 04/03/18 2238 04/04/18 0506 04/04/18 2148 04/05/18 0525 04/06/18 0508 04/06/18 0513  NA 156*   < > 155* 146*   < > 136 139 139 138 143  --  150*  K 2.8*   < > 3.4* 3.1*   < > 2.8* 2.3* 2.4* 2.2* 2.8*  --  2.9*  CL 116*   < > 117* 102   < > 85* 91* 92* 101 102  --  114*  CO2 29   < > 28 30   < > 34* 33* '30 27 27  ' --  24  GLUCOSE 155*   < > 157* 225*   < > 212* 134* 112* 140* 120*  --  156*  BUN 56*   < > 34* 33*   < > 35* 33* 32* 22* 19  --  14  CREATININE 0.81   < > 0.63 0.84   < > 0.94 0.84 0.93 0.87 0.88  --  0.79  CALCIUM 9.0   < > 8.9 8.9   < > 9.1 9.0 9.1 8.9 9.0  --  8.8*  MG 2.3  --  2.2 1.6*  --  2.0  --   --   --   --  2.2  --   PHOS 4.6  --  3.2 3.6  --  5.4*  --   --   --   --   --  3.1   < > = values in this interval not displayed.   Estimated Creatinine Clearance: 86.9 mL/min (by C-G formula based on SCr of 0.79 mg/dL).  LIVER Recent Labs  Lab 04/03/18 0510 04/06/18 0513  AST 29  --   ALT 40  --   ALKPHOS 85  --   BILITOT 0.7  --   PROT 6.2*  --   ALBUMIN 2.6* 2.7*   INFECTIOUS Recent Labs  Lab 04/02/18 1026 04/03/18 0510 04/04/18 0506  PROCALCITON 0.26 0.36 0.44   ENDOCRINE CBG (last 3)  Recent Labs    04/05/18 1605 04/05/18 2018 04/06/18 0750  GLUCAP 103* 114* 126*   IMAGING x48h  - image(s) personally visualized  -   highlighted in bold Dg Chest Port 1 View  Result Date: 04/05/2018 CLINICAL DATA:  Respiratory failure.  Breast cancer.  Hypertension. EXAM: PORTABLE CHEST 1 VIEW COMPARISON:  04/04/2018 FINDINGS: Right internal jugular and PICC lines are unchanged in position. Patient rotated minimally right. Midline trachea. Normal heart size. No pleural effusion or pneumothorax. Biapical pleural thickening. Areas of subsegmental atelectasis at both lung bases. IMPRESSION: No acute cardiopulmonary  disease. Similar subsegmental atelectasis in both lung bases. Electronically Signed   By: Abigail Miyamoto M.D.   On: 04/05/2018 08:05   Dg Swallowing Func-speech Pathology  Result Date: 04/05/2018 Objective Swallowing Evaluation: Type of  Study: MBS-Modified Barium Swallow Study  Patient Details Name: Diane Short MRN: [id number] Date of Birth: [birth year] Today's Date: 04/05/2018 Time: SLP Start Time (ACUTE ONLY): 1110 -SLP Stop Time (ACUTE ONLY): 1136 SLP Time Calculation (min) (ACUTE ONLY): 26 min Past Medical History: Past Medical History: Diagnosis Date . Anemia   due to chemo . Anxiety  . Arthritis  . Cancer [facility])   breast cancer . Cerebral hemorrhage ([place]) 1986 . Depression   due to cancer dx . Diverticulosis  . GERD (gastroesophageal reflux disease)  . Headache  . History of hiatal hernia  . Hypertension  . 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 Neutropenic fever ([place])   Chemo related . Osteonecrosis ([place]) 2017-2018  Right Jaw bone . Thrush, oral   Chemo related Past Surgical History: Past Surgical History: Procedure Laterality Date . BREAST SURGERY Left 11/2016  metastic to bone . CATARACT EXTRACTION Right 03/02/2017 . CEREBRAL ANGIOGRAM  1986 . CESAREAN SECTION  1980 . IR 3D INDEPENDENT [facility]  01/20/2017 . IR 3D INDEPENDENT [facility]  05/26/2017 . IR ANGIO INTRA EXTRACRAN SEL COM CAROTID INNOMINATE UNI R MOD SED  04/09/2017 . IR ANGIO INTRA EXTRACRAN SEL INTERNAL CAROTID BILAT MOD SED  01/20/2017 . IR ANGIO INTRA EXTRACRAN SEL INTERNAL CAROTID BILAT MOD SED  11/17/2017 . IR ANGIO INTRA EXTRACRAN SEL INTERNAL CAROTID BILAT MOD SED  03/23/2018 . IR ANGIO INTRA EXTRACRAN SEL INTERNAL CAROTID UNI R MOD SED  03/13/2017 . IR ANGIO VERTEBRAL SEL VERTEBRAL BILAT MOD SED  01/20/2017 . IR ANGIO VERTEBRAL SEL VERTEBRAL UNI L MOD SED  05/26/2017 . IR ANGIO VERTEBRAL SEL VERTEBRAL UNI L MOD SED  11/17/2017 . IR ANGIOGRAM FOLLOW UP STUDY  03/13/2017 . IR ANGIOGRAM FOLLOW UP STUDY  03/13/2017 . IR ANGIOGRAM FOLLOW UP STUDY  03/13/2017 . IR  ANGIOGRAM FOLLOW UP STUDY  03/13/2017 . IR ANGIOGRAM FOLLOW UP STUDY  03/13/2017 . IR ANGIOGRAM FOLLOW UP STUDY  03/13/2017 . IR ANGIOGRAM FOLLOW UP STUDY  03/13/2017 . IR ANGIOGRAM FOLLOW UP STUDY  03/13/2017 . IR ANGIOGRAM FOLLOW UP STUDY  03/13/2017 . IR ANGIOGRAM FOLLOW UP STUDY  03/13/2017 . IR ANGIOGRAM FOLLOW UP STUDY  03/13/2017 . IR ANGIOGRAM FOLLOW UP STUDY  03/13/2017 . IR ANGIOGRAM FOLLOW UP STUDY  04/09/2017 . IR ANGIOGRAM FOLLOW UP STUDY  05/26/2017 . IR ANGIOGRAM FOLLOW UP STUDY  05/26/2017 . IR ANGIOGRAM FOLLOW UP STUDY  03/23/2018 . IR NEURO EACH ADD'L AFTER BASIC UNI LEFT (MS)  04/09/2017 . IR NEURO EACH ADD'L AFTER BASIC UNI LEFT (MS)  03/23/2018 . IR NEURO EACH ADD'L AFTER BASIC UNI RIGHT (MS)  03/13/2017 . IR TRANSCATH/EMBOLIZ  03/13/2017 . IR TRANSCATH/EMBOLIZ  04/09/2017 . IR TRANSCATH/EMBOLIZ  05/26/2017 . IR TRANSCATH/EMBOLIZ  03/23/2018 . IR US GUIDE VASC ACCESS RIGHT  04/09/2017 . MASTECTOMY Left  . port a cathh  04/2016 . RADIOLOGY WITH ANESTHESIA N/A 03/13/2017  Procedure: stent coiling of right MCA aneurysm;  Surgeon: Consuella Lose, MD;  Location: [place];  Service: Radiology;  Laterality: N/A; . RADIOLOGY WITH ANESTHESIA N/A 04/09/2017  Procedure: Arteriogram, pipeline embolization of aneruysm;  Surgeon: Consuella Lose, MD;  Location: [place];  Service: Radiology;  Laterality: N/A; . RADIOLOGY WITH ANESTHESIA N/A 05/26/2017  Procedure: Stent supported coil embolization;  Surgeon: Consuella Lose, MD;  Location: [place];  Service: Radiology;  Laterality: N/A; . RADIOLOGY WITH ANESTHESIA N/A 03/23/2018  Procedure: IR WITH ANESTHESIA;  Surgeon: Consuella Lose, MD;  Location: [place];  Service: Radiology;  Laterality: N/A; . TUBAL  LIGATION  1983 HPI: 62 yo female hx smoking, stage 4 breast cancer with brain met tx with chemo and brain XRT, cerebral aneurysms s/p intervention presented with persistent headache, and altered mental status. Found to have stable intraventricular hemorrhage as well as  subarachnoid hemorrhage concentrated in left prepontine cistern and left CP angle cistern. Intubated 9/9-9/11 and re-intubated 9/11-9/21. CXR 9/22 mild patchy/platelike lower lobe opacities, likely atelectasis. Multiple attempts at NGT unsuccessful and pt has crucial po medication.   No data recorded Assessment / Plan / Recommendation CHL IP CLINICAL IMPRESSIONS 04/05/2018 Clinical Impression Pt demonstrates a moderate post extubation related dysphagia complicated by cognitive deficits. Pt needs total hand over hand assist to self feed due to weakness, verbal cues to take straw sips, verbal cues to initaite swallow. She orally holds most boluses for several seconds with good oral containment but intermittent slight delay in swallow initiation. With larger boluses of nectar this does result in trace sensed aspiration events before/during the swallow, apparently via the interarytenoid space where some glottic incompetence may be present after intubation. Study was quite limited as pt appeared to become nauseated. Pt is recommended to initiate a conservative diet of puree and and honey thick liquids with upgrade at bedside given consistent sensation on exam. Be aware of potential for esophageal dysphagia, pt did have one instance of trace backflow that she sensed and swallowed.  SLP Visit Diagnosis Dysphagia, oropharyngeal phase (R13.12) Attention and concentration deficit following -- Frontal lobe and executive function deficit following -- Impact on safety and function Mild aspiration risk   CHL IP TREATMENT RECOMMENDATION 04/05/2018 Treatment Recommendations Therapy as outlined in treatment plan below   Prognosis 04/05/2018 Prognosis for Safe Diet Advancement Good Barriers to Reach Goals Cognitive deficits Barriers/Prognosis Comment -- CHL IP DIET RECOMMENDATION 04/05/2018 SLP Diet Recommendations Dysphagia 1 (Puree) solids;Honey thick liquids Liquid Administration via Cup;Straw Medication Administration Crushed with  puree Compensations Slow rate;Small sips/bites Postural Changes Seated upright at 90 degrees;Remain semi-upright after after feeds/meals (Comment)   CHL IP OTHER RECOMMENDATIONS 04/05/2018 Recommended Consults -- Oral Care Recommendations Oral care BID Other Recommendations Order thickener from pharmacy   CHL IP FOLLOW UP RECOMMENDATIONS 04/05/2018 Follow up Recommendations Inpatient Rehab   CHL IP FREQUENCY AND DURATION 04/05/2018 Speech Therapy Frequency (ACUTE ONLY) min 2x/week Treatment Duration 2 weeks      CHL IP ORAL PHASE 04/05/2018 Oral Phase Impaired Oral - Pudding Teaspoon -- Oral - Pudding Cup -- Oral - Honey Teaspoon -- Oral - Honey Cup -- Oral - Nectar Teaspoon Reduced posterior propulsion;Holding of bolus Oral - Nectar Cup Reduced posterior propulsion;Holding of bolus Oral - Nectar Straw Reduced posterior propulsion;Holding of bolus Oral - Thin Teaspoon -- Oral - Thin Cup -- Oral - Thin Straw -- Oral - Puree Reduced posterior propulsion;Holding of bolus Oral - Mech Soft -- Oral - Regular -- Oral - Multi-Consistency -- Oral - Pill -- Oral Phase - Comment --  CHL IP PHARYNGEAL PHASE 04/05/2018 Pharyngeal Phase Impaired Pharyngeal- Pudding Teaspoon -- Pharyngeal -- Pharyngeal- Pudding Cup -- Pharyngeal -- Pharyngeal- Honey Teaspoon -- Pharyngeal -- Pharyngeal- Honey Cup -- Pharyngeal -- Pharyngeal- Nectar Teaspoon WFL Pharyngeal -- Pharyngeal- Nectar Cup Penetration/Aspiration before swallow;Penetration/Aspiration during swallow;Reduced airway/laryngeal closure;Trace aspiration Pharyngeal Material enters airway, passes BELOW cords then ejected out Pharyngeal- Nectar Straw Penetration/Aspiration before swallow;Penetration/Aspiration during swallow;Reduced airway/laryngeal closure;Trace aspiration Pharyngeal Material enters airway, passes BELOW cords then ejected out;Material does not enter airway Pharyngeal- Thin Teaspoon -- Pharyngeal -- Pharyngeal- Thin Cup -- Pharyngeal -- Pharyngeal- Thin  Straw --  Pharyngeal -- Pharyngeal- Puree WFL Pharyngeal -- Pharyngeal- Mechanical Soft -- Pharyngeal -- Pharyngeal- Regular -- Pharyngeal -- Pharyngeal- Multi-consistency -- Pharyngeal -- Pharyngeal- Pill -- Pharyngeal -- Pharyngeal Comment --  No flowsheet data found. 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 POD #14 Plan: Nimotop and TCDs per NSGY Ongoing neuro exams Seizure precautions Continue keppra SBP goal < 180, current below goal  Continuing Norvasc 10 mg PO, coreg 26m BID, labetalol 1038mTID, catapress 0.61m10mpatch apresoline / lopressor PRN   Acute metabolic encephalopathy resolved   Volume overload - resolved Contraction alkalosis 2/2 diuresis; lasix gtt stopped 9/21 Hypokalemia - ongoing likely now 2/2 numerous stools  KCL 40 meq x 2 Mag ok, 2.2 Trend renal panel/ mag/ strict I/Os, daily wts  Aspiration pneumonia vs bilateral atelectasis  - PCT uptrending 9/20- 9/22 w/ leukocyostsis; unasyn started 9/22  Plan:  Stop unasyn on day 3 Monitor fever curve/ WBC ongoing aspiration risk  E coli UTI - completed rocephin 9/17  Dysphagia - s/p MBS 9/23, Dys 1 diet Plan: SLP following   Acute urinary retention - s/p in/out cath x 2, yielding 1200 ml on first cath Plan:  Reinsert foley given large volume of retention  May need urology consult  Stage 4 breast cancer s/p mastectomy, XRT to brain met, and recently on chemo -Chemo induced anemia, thrombocytopenia.  Followed by oncology in [place] Plan:  Stable cbc, trend    Hyperglycemia Plan: CBG q 4 SSI- resistant  Insomnia  Ativan prn qHS, 0.5mg58mSUMMARY OF TODAY'S PLAN:  Electrolyte replacement Replace foley Get PIV (has port as well), consider d/c picc Possible tx to SDU if ok w/ NSGY.  PCCM with nothing further to add.  Will tx primary care to [facility] as of 9/25.  Best Practice / Goals of Care / Disposition.   DVT PROPHYLAXIS: SCDs/ lovenox SUP: protonix NUTRITION: Dysphagia 1 - honey thick MOBILITY: progress with PT/OT GOALS OF CARE: full code FAMILY DISCUSSIONS: husband updated on plan of care.  If ok w/NSGY will tx to SDU today.    BrooKennieth RadACNP-BC [facility] Pgr: [phone] no answer [phone]/2019, 8:12 AM

## 2018-04-06 NOTE — Progress Notes (Signed)
OT Evaluation  PTA, pt lived with husband and was independent with ADL and mobility. Pt currently undergoing cancer treatment. Pt currently requires +2 Max A with mobility and Max A with ADL. Recommend CIR for rehab. Pt will most likely 24/7 assistance after DC. Will follow acutely to address established goals and facilitate DC to next venue of care.      04/06/18 1130  OT Visit Information  Last OT Received On 04/06/18  Assistance Needed +2  PT/OT/SLP Co-Evaluation/Treatment Yes  Reason for Co-Treatment Complexity of the patient's impairments (multi-system involvement);Necessary to address cognition/behavior during functional activity;To address functional/ADL transfers;For patient/therapist safety  OT goals addressed during session ADL's and self-care  History of Present Illness 62 yo female hx smoking, stage 4 breast cancer with brain met tx with chemo and brain XRT, cerebral aneurysms s/p intervention presented with persistent headache, and altered mental status.  Found to have stable intraventricular hemorrhage as well as subarachnoid hemorrhage concentrated in left prepontine cistern and left CP angle cistern.  Intubated 9/9-9/11 and re-intubated 9/11-9/21. CXR 9/22 mild patchy/platelike lower lobe opacities, likely atelectasis.  Precautions  Precautions Fall  Home Living  Family/patient expects to be discharged to: Private residence  Living Arrangements Spouse/significant other  Available Help at Discharge Family;Available 24 hours/day  Type of Home House  Home Access Level entry  Home Layout One level;Other (Comment) (1 step to den/kitchen)  Bathroom Shower/Tub Tub/shower unit;Curtain  Corporate treasurer Yes  How Accessible Accessible via wheelchair  [place] bars - tub/shower   Lives With Spouse  Prior Function  Level of Independence Independent  Comments drove; husband occasionally helpes with drying off legs due to unsteadiness   Communication  Communication Expressive difficulties  Pain Assessment  Pain Assessment Faces  Faces Pain Scale 0  Cognition  Arousal/Alertness Awake/alert  Behavior During Therapy Flat affect  Overall Cognitive Status Impaired/Different from baseline  Area of Impairment Orientation;Attention;Memory;Following commands;Safety/judgement;Awareness;Problem solving  Orientation Level Disoriented to;Place ([facility])  Current Attention Level Sustained  Memory Decreased recall of precautions;Decreased short-term memory  Following Commands Follows one step commands with increased time  Safety/Judgement Decreased awareness of safety;Decreased awareness of deficits  Awareness Intellectual  Problem Solving Slow processing;Decreased initiation;Difficulty sequencing;Requires verbal cues;Requires tactile cues  Upper Extremity Assessment  Upper Extremity Assessment Generalized weakness;RUE deficits/detail;LUE deficits/detail (LUE appeared stronger than R; neuropathy from chemo)  RUE Deficits / Details unable to complete hand to mouth movement due to weakness; gross grasp/release; absent in-hand manipulation skills  RUE Sensation decreased light touch  RUE Coordination decreased fine motor;decreased gross motor  LUE Deficits / Details LUE stronger than RUE but unable to complete hand to mouth pattern; gross grasp/release  LUE Sensation decreased light touch  LUE Coordination decreased gross motor;decreased fine motor  Lower Extremity Assessment  Lower Extremity Assessment Defer to PT evaluation  Cervical / Trunk Assessment  Cervical / Trunk Assessment Other exceptions  Cervical / Trunk Exceptions posterior bias  ADL  Overall ADL's  Needs assistance/impaired  Eating/Feeding Maximal assistance  Grooming Maximal assistance  Upper Body Bathing Maximal assistance;Bed level  Lower Body Bathing Total assistance  Upper Body Dressing  Total assistance  Lower Body Dressing Total assistance;Bed level   Functional mobility during ADLs Maximal assistance;+2 for physical assistance (sit - stand)  General ADL Comments increased attempts at participation once upright in bed; easily distracted during ADL  Vision- History  Baseline Vision/History Wears glasses  Wears Glasses Reading only  Vision- Assessment  Vision Assessment? Vision impaired- to  be further tested in functional context  Additional Comments poor visual attention; conjugate gaze; no report of diplopia  Perception  Comments will further assess  Praxis  Praxis tested? Deficits  Deficits Initiation  Bed Mobility  Overal bed mobility Needs Assistance  Bed Mobility Supine to Sit;Sit to Supine  Supine to sit Max assist;+2 for physical assistance  Sit to supine Max assist;+2 for physical assistance  General bed mobility comments Pt initiates movement for bed mobility;   Transfers  Overall transfer level Needs assistance  Equipment used 2 person hand held assist  Transfers Sit to/from Stand  Sit to Stand +2 physical assistance;Max assist  General transfer comment gati belt and bed pad used  OT - End of Session  Equipment Utilized During Treatment Gait belt  Activity Tolerance Patient tolerated treatment well  Patient left in bed;with call bell/phone within reach;with bed alarm set;with SCD's reapplied;Other (comment) (bed left in chair position)  Nurse Communication Mobility status;Need for lift equipment (use maximove)  OT Assessment  OT Recommendation/Assessment Patient needs continued OT Services  OT Visit Diagnosis Other abnormalities of gait and mobility (R26.89);Muscle weakness (generalized) (M62.81);Apraxia (R48.2);Other symptoms and signs involving cognitive function  OT Problem List Decreased strength;Decreased range of motion;Decreased activity tolerance;Impaired balance (sitting and/or standing);Impaired vision/perception;Decreased coordination;Decreased cognition;Decreased safety awareness;Decreased knowledge of  use of DME or AE;Decreased knowledge of precautions;Impaired sensation;Obesity;Impaired UE functional use;Pain;Increased edema  OT Plan  OT Frequency (ACUTE ONLY) Min 2X/week  OT Treatment/Interventions (ACUTE ONLY) Self-care/ADL training;Therapeutic exercise;Neuromuscular education;Energy conservation;DME and/or AE instruction;Therapeutic activities;Cognitive remediation/compensation;Visual/perceptual remediation/compensation;Patient/family education;Balance training  AM-PAC OT "6 Clicks" Daily Activity Outcome Measure  Help from another person eating meals? 2  Help from another person taking care of personal grooming? 2  Help from another person toileting, which includes using toliet, bedpan, or urinal? 1  Help from another person bathing (including washing, rinsing, drying)? 2  Help from another person to put on and taking off regular upper body clothing? 2  Help from another person to put on and taking off regular lower body clothing? 1  6 Click Score 10  ADL G Code Conversion CL  OT Recommendation  Recommendations for Other Services Rehab consult  Follow Up Recommendations CIR;Supervision/Assistance - 24 hour  OT Equipment 3 in 1 bedside commode;Tub/shower bench;Hospital bed  Individuals Consulted  Consulted and Agree with Results and Recommendations Patient;Family member/caregiver  Family Member Consulted husband  Acute Rehab OT Goals  Patient Stated Goal to get stronger  OT Goal Formulation With patient/family  Time For Goal Achievement 04/20/18  Potential to Achieve Goals Good  OT Time Calculation  OT Start Time (ACUTE ONLY) 1518  OT Stop Time (ACUTE ONLY) 1555  OT Time Calculation (min) 37 min  OT General Charges  $OT Visit 1 Visit  OT Evaluation  $OT Eval Moderate Complexity 1 Mod  Written Expression  Dominant Hand Right  Maurie Boettcher, OT/L   Acute OT Clinical Specialist Acute Rehabilitation Services Pager [phone] Office [phone]

## 2018-04-06 NOTE — Progress Notes (Signed)
Patient did not void any throughout the night. Patient was bladder scanned at 0720 and had 700cc in bladder. In and out cath was preformed and 725cc was removed. Jerene Pitch, NP with CCM was notified. This was the 2nd attempt to in and out cath the patient in the last 24 hours. Order to place foley catheter was given. No medication orders were placed at this time. Will continue to monitor patient.

## 2018-04-06 NOTE — Progress Notes (Signed)
  NEUROSURGERY PROGRESS NOTE   No issues overnight. Pt has no complaints this afternoon.  EXAM:  BP (!) 190/89   Pulse 94   Temp 99 F (37.2 C) (Oral)   Resp 14   Ht 5\' 4"  (1.626 m)   Wt 102 kg   LMP 08/14/2010 (Exact Date)   SpO2 100%   BMI 38.60 kg/m   Awake, alert, oriented  Speech fluent, appropriate  CN grossly intact  Strength symmetric, generalized weakness  IMPRESSION:  62 y.o. female SAH d# 14, neurologically stable. No clinical or radiographic evidence of spasm thus far. Dysphagia  PLAN: - Seems to be stable for transfer to CIR when bed available

## 2018-04-06 NOTE — Evaluation (Signed)
Speech Language Pathology Evaluation Patient Details Name: Diane Short MRN: [id number] DOB: September 06, 1955 Today's Date: 04/06/2018 Time: 4008-6761 SLP Time Calculation (min) (ACUTE ONLY): 23 min  Problem List:  Patient Active Problem List   Diagnosis Date Noted  . Endotracheal tube present   . Subarachnoid hemorrhage ([place]) 03/22/2018  . Malignant neoplasm of left breast in female, estrogen receptor negative ([place]) 01/07/2018  . Brain metastasis ([place]) 01/07/2018  . Cerebral aneurysm, nonruptured 03/13/2017   Past Medical History:  Past Medical History:  Diagnosis Date  . 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 Anxiety   . Arthritis   . Cancer [facility])    breast cancer  . Cerebral hemorrhage ([place]) 1986  . Depression    due to cancer dx  . Diverticulosis   . GERD (gastroesophageal reflux disease)   . Headache   . History of hiatal hernia   . Hypertension   . 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 Neutropenic fever ([place])    Chemo related  . Osteonecrosis ([place]) 2017-2018   Right Jaw bone  . Thrush, oral    Chemo related   Past Surgical History:  Past Surgical History:  Procedure Laterality Date  . BREAST SURGERY Left 11/2016   metastic to bone  . CATARACT EXTRACTION Right 03/02/2017  . CEREBRAL ANGIOGRAM  1986  . CESAREAN SECTION  1980  . IR 3D INDEPENDENT WKST  01/20/2017  . IR 3D INDEPENDENT [facility]KST  05/26/2017  . IR ANGIO INTRA EXTRACRAN SEL COM CAROTID INNOMINATE UNI R MOD SED  04/09/2017  . IR ANGIO INTRA EXTRACRAN SEL INTERNAL CAROTID BILAT MOD SED  01/20/2017  . IR ANGIO INTRA EXTRACRAN SEL INTERNAL CAROTID BILAT MOD SED  11/17/2017  . IR ANGIO INTRA EXTRACRAN SEL INTERNAL CAROTID BILAT MOD SED  03/23/2018  . IR ANGIO INTRA EXTRACRAN SEL INTERNAL CAROTID UNI R MOD SED  03/13/2017  . IR ANGIO VERTEBRAL SEL VERTEBRAL BILAT MOD SED  01/20/2017  . IR ANGIO VERTEBRAL SEL VERTEBRAL UNI L MOD SED  05/26/2017  . IR ANGIO VERTEBRAL SEL VERTEBRAL UNI L MOD SED  11/17/2017  . IR ANGIOGRAM FOLLOW UP  STUDY  03/13/2017  . IR ANGIOGRAM FOLLOW UP STUDY  03/13/2017  . IR ANGIOGRAM FOLLOW UP STUDY  03/13/2017  . IR ANGIOGRAM FOLLOW UP STUDY  03/13/2017  . IR ANGIOGRAM FOLLOW UP STUDY  03/13/2017  . IR ANGIOGRAM FOLLOW UP STUDY  03/13/2017  . IR ANGIOGRAM FOLLOW UP STUDY  03/13/2017  . IR ANGIOGRAM FOLLOW UP STUDY  03/13/2017  . IR ANGIOGRAM FOLLOW UP STUDY  03/13/2017  . IR ANGIOGRAM FOLLOW UP STUDY  03/13/2017  . IR ANGIOGRAM FOLLOW UP STUDY  03/13/2017  . IR ANGIOGRAM FOLLOW UP STUDY  03/13/2017  . IR ANGIOGRAM FOLLOW UP STUDY  04/09/2017  . IR ANGIOGRAM FOLLOW UP STUDY  05/26/2017  . IR ANGIOGRAM FOLLOW UP STUDY  05/26/2017  . IR ANGIOGRAM FOLLOW UP STUDY  03/23/2018  . IR NEURO EACH ADD'L AFTER BASIC UNI LEFT (MS)  04/09/2017  . IR NEURO EACH ADD'L AFTER BASIC UNI LEFT (MS)  03/23/2018  . IR NEURO EACH ADD'L AFTER BASIC UNI RIGHT (MS)  03/13/2017  . IR TRANSCATH/EMBOLIZ  03/13/2017  . IR TRANSCATH/EMBOLIZ  04/09/2017  . IR TRANSCATH/EMBOLIZ  05/26/2017  . IR TRANSCATH/EMBOLIZ  03/23/2018  . IR US GUIDE VASC ACCESS RIGHT  04/09/2017  . MASTECTOMY Left   . port a cathh  04/2016  . RADIOLOGY WITH ANESTHESIA  N/A 03/13/2017   Procedure: stent coiling of right MCA aneurysm;  Surgeon: Consuella Lose, MD;  Location: [place];  Service: Radiology;  Laterality: N/A;  . RADIOLOGY WITH ANESTHESIA N/A 04/09/2017   Procedure: Arteriogram, pipeline embolization of aneruysm;  Surgeon: Consuella Lose, MD;  Location: [place];  Service: Radiology;  Laterality: N/A;  . RADIOLOGY WITH ANESTHESIA N/A 05/26/2017   Procedure: Stent supported coil embolization;  Surgeon: Consuella Lose, MD;  Location: [place];  Service: Radiology;  Laterality: N/A;  . RADIOLOGY WITH ANESTHESIA N/A 03/23/2018   Procedure: IR WITH ANESTHESIA;  Surgeon: Consuella Lose, MD;  Location: [place];  Service: Radiology;  Laterality: N/A;  . TUBAL LIGATION  1983   HPI:  62 yo female hx smoking, stage 4 breast cancer with brain met tx with chemo  and brain XRT, cerebral aneurysms s/p intervention presented with persistent headache, and altered mental status. Found to have stable intraventricular hemorrhage as well as subarachnoid hemorrhage concentrated in left prepontine cistern and left CP angle cistern. Intubated 9/9-9/11 and re-intubated 9/11-9/21. CXR 9/22 mild patchy/platelike lower lobe opacities, likely atelectasis. Multiple attempts at NGT unsuccessful and pt has crucial po medication.     Assessment / Plan / Recommendation Clinical Impression  Pt demonstrates moderate cognitive impairment in areas of sustained attention, working memory, awareness, problem solving and initaiton. She needs simplification of directions and multiple repetitions, discussed basic strategies with husband. Though she can read she is substituting words possibly into the right visual field that seem to make sense to her. Needs cues with visual scanning. Speech is still quite unintelligible due to dysphonia and decreased breath support. Recommend CIR at d/c to address cognitive function, will continue efforts acutely.     SLP Assessment  SLP Recommendation/Assessment: Patient needs continued Speech Lanaguage Pathology Services SLP Visit Diagnosis: Aphonia (R49.1);Cognitive communication deficit (R41.841)    Follow Up Recommendations  Inpatient Rehab    Frequency and Duration min 2x/week  2 weeks      SLP Evaluation Cognition  Overall Cognitive Status: Impaired/Different from baseline Arousal/Alertness: Awake/alert Orientation Level: Oriented to person;Disoriented to place;Disoriented to time;Disoriented to situation Attention: Focused;Sustained Focused Attention: Appears intact Sustained Attention: Impaired Sustained Attention Impairment: Verbal basic;Functional basic Memory: Impaired Memory Impairment: Storage deficit Awareness: Impaired Awareness Impairment: Emergent impairment Problem Solving: Impaired Problem Solving Impairment: Verbal  basic;Functional basic Executive Function: Self Monitoring;Initiating Initiating: Impaired Initiating Impairment: Verbal basic;Functional basic Self Monitoring: Impaired Self Monitoring Impairment: Verbal basic;Functional basic       Comprehension  Auditory Comprehension Overall Auditory Comprehension: Impaired Yes/No Questions: Within Functional Limits Commands: Impaired One Step Basic Commands: 75-100% accurate Two Step Basic Commands: 25-49% accurate Conversation: Simple Interfering Components: Attention;Processing speed;Working memory Reading Comprehension Reading Status: Impaired Word level: Within functional limits Sentence Level: Impaired Functional Environmental (signs, name badge): Impaired Interfering Components: Attention;Visual scanning    Expression Verbal Expression Overall Verbal Expression: Appears within functional limits for tasks assessed   Oral / Motor  Oral Motor/Sensory Function Overall Oral Motor/Sensory Function: Moderate impairment Facial ROM: Reduced left;Suspected CN VII (facial) dysfunction Facial Symmetry: Abnormal symmetry right;Suspected CN VII (facial) dysfunction Facial Strength: Reduced right;Suspected CN VII (facial) dysfunction Motor Speech Overall Motor Speech: Impaired Respiration: Impaired Level of Impairment: Phrase Phonation: Breathy;Low vocal intensity;Hoarse Articulation: Within functional limitis Intelligibility: Intelligibility reduced Word: 50-74% accurate Phrase: 50-74% accurate Sentence: 50-74% accurate Conversation: 50-74% accurate Motor Planning: Witnin functional limits   GO  Herbie [place], [place] CCC-SLP [phone]  Lynann Beaver 04/06/2018, 11:12 AM

## 2018-04-07 ENCOUNTER — Other Ambulatory Visit (HOSPITAL_COMMUNITY): Payer: Medicaid Other

## 2018-04-07 LAB — RENAL FUNCTION PANEL
ALBUMIN: 2.7 g/dL — AB (ref 3.5–5.0)
Anion gap: 12 (ref 5–15)
BUN: 14 mg/dL (ref 6–20)
CALCIUM: 8.7 mg/dL — AB (ref 8.9–10.3)
CHLORIDE: 121 mmol/L — AB (ref 98–111)
CO2: 22 mmol/L (ref 22–32)
CREATININE: 0.76 mg/dL (ref 0.44–1.00)
Glucose, Bld: 129 mg/dL — ABNORMAL HIGH (ref 70–99)
Phosphorus: 3.1 mg/dL (ref 2.5–4.6)
Potassium: 3 mmol/L — ABNORMAL LOW (ref 3.5–5.1)
Sodium: 155 mmol/L — ABNORMAL HIGH (ref 135–145)

## 2018-04-07 LAB — GLUCOSE, CAPILLARY
GLUCOSE-CAPILLARY: 140 mg/dL — AB (ref 70–99)
Glucose-Capillary: 125 mg/dL — ABNORMAL HIGH (ref 70–99)
Glucose-Capillary: 134 mg/dL — ABNORMAL HIGH (ref 70–99)
Glucose-Capillary: 139 mg/dL — ABNORMAL HIGH (ref 70–99)
Glucose-Capillary: 116 mg/dL — ABNORMAL HIGH (ref 70–99)

## 2018-04-07 LAB — CULTURE, BLOOD (ROUTINE X 2)
Culture: NO GROWTH
Culture: NO GROWTH
SPECIAL REQUESTS: ADEQUATE
Special Requests: ADEQUATE

## 2018-04-07 LAB — URINALYSIS, ROUTINE W REFLEX MICROSCOPIC
BILIRUBIN URINE: NEGATIVE
Bacteria, UA: NONE SEEN
GLUCOSE, UA: NEGATIVE mg/dL
Hgb urine dipstick: NEGATIVE
KETONES UR: NEGATIVE mg/dL
Leukocytes, UA: NEGATIVE
NITRITE: NEGATIVE
PH: 5 (ref 5.0–8.0)
Protein, ur: 30 mg/dL — AB
Specific Gravity, Urine: 1.033 — ABNORMAL HIGH (ref 1.005–1.030)

## 2018-04-07 LAB — MAGNESIUM: Magnesium: 2.2 mg/dL (ref 1.7–2.4)

## 2018-04-07 NOTE — Progress Notes (Signed)
Physical Therapy Treatment Patient Details Name: Diane Short MRN: [id number] DOB: Apr 07, 1956 Today's Date: 04/07/2018    History of Present Illness 62 yo female hx smoking, stage 4 breast cancer with brain met tx with chemo and brain XRT, cerebral aneurysms s/p intervention presented with persistent headache, and altered mental status.  Found to have stable intraventricular hemorrhage as well as subarachnoid hemorrhage concentrated in left prepontine cistern and left CP angle cistern.  Intubated 9/9-9/11 and re-intubated 9/11-9/21. CXR 9/22 mild patchy/platelike lower lobe opacities, likely atelectasis.    PT Comments    Pt performed LE supine exercises with increased time and effort as it is difficult for her to focus on task that is being performed.  Pt slow and required multimodal cues to achieve sitting edge of bed.  Overall she remains to require +2 assistance for bed mobility and transfers.  Continue to recommend intensive therapies in a post acute setting to improve strength and function before returning home.    Follow Up Recommendations  CIR;Supervision/Assistance - 24 hour     Equipment Recommendations  Other (comment)(TBA)    Recommendations for Other Services Rehab consult     Precautions / Restrictions Precautions Precautions: Fall Restrictions Weight Bearing Restrictions: No    Mobility  Bed Mobility Overal bed mobility: Needs Assistance Bed Mobility: Supine to Sit     Supine to sit: Max assist;+2 for physical assistance     General bed mobility comments: Pt initiates movement for bed mobility; Required facilitation for LE advancement, trunk elevation and scooting to edge of bed.    Transfers Overall transfer level: Needs assistance Equipment used: 2 person hand held assist Transfers: Sit to/from Stand Sit to Stand: +2 physical assistance;Max assist         General transfer comment: Pt unable to push from seated surface required facilitation for  forward weight shifting, pushing through B LEs, blocking of L knee during transfer as her L side is weaker.  Transfer performed to R side with L arm dropped down on chair.    Ambulation/Gait Ambulation/Gait assistance: (NT)               Stairs             Wheelchair Mobility    Modified Rankin (Stroke Patients Only)       Balance Overall balance assessment: Needs assistance Sitting-balance support: Bilateral upper extremity supported;Single extremity supported;Feet supported Sitting balance-Leahy Scale: Poor Sitting balance - Comments: tending to list posteriorly, needing assist to come forward to midline   Standing balance support: Bilateral upper extremity supported Standing balance-Leahy Scale: Poor Standing balance comment: significant truncal assist given , but pt unable to fully extend her hips.                            Cognition Arousal/Alertness: Awake/alert Behavior During Therapy: Flat affect Overall Cognitive Status: Impaired/Different from baseline Area of Impairment: Orientation;Attention;Memory;Following commands;Safety/judgement;Awareness;Problem solving                 Orientation Level: Disoriented to;Place Current Attention Level: Sustained Memory: Decreased recall of precautions;Decreased short-term memory Following Commands: Follows one step commands with increased time Safety/Judgement: Decreased awareness of safety;Decreased awareness of deficits Awareness: Intellectual Problem Solving: Slow processing;Decreased initiation;Difficulty sequencing;Requires verbal cues;Requires tactile cues        Exercises General Exercises - Lower Extremity Ankle Circles/Pumps: AAROM;Both;10 reps;Supine Heel Slides: AROM;Both;10 reps;Supine    General Comments  Pertinent Vitals/Pain Pain Assessment: No/denies pain Faces Pain Scale: No hurt Pain Intervention(s): Monitored during session;Repositioned    Home Living                       Prior Function            PT Goals (current goals can now be found in the care plan section) Acute Rehab PT Goals Patient Stated Goal: to get stronger Potential to Achieve Goals: Fair Progress towards PT goals: Progressing toward goals    Frequency    Min 3X/week      PT Plan Current plan remains appropriate    Co-evaluation              AM-PAC PT "6 Clicks" Daily Activity  Outcome Measure  Difficulty turning over in bed (including adjusting bedclothes, sheets and blankets)?: Unable Difficulty moving from lying on back to sitting on the side of the bed? : Unable Difficulty sitting down on and standing up from a chair with arms (e.g., wheelchair, bedside commode, etc,.)?: Unable Help needed moving to and from a bed to chair (including a wheelchair)?: Total Help needed walking in hospital room?: Total Help needed climbing 3-5 steps with a railing? : Total 6 Click Score: 6    End of Session Equipment Utilized During Treatment: Gait belt Activity Tolerance: Patient limited by fatigue Patient left: in bed;with call bell/phone within reach;with bed alarm set;with SCD's reapplied Nurse Communication: Mobility status;Need for lift equipment PT Visit Diagnosis: Hemiplegia and hemiparesis;Other abnormalities of gait and mobility (R26.89);Muscle weakness (generalized) (M62.81) Hemiplegia - Right/Left: Right Hemiplegia - caused by: Nontraumatic intracerebral hemorrhage     Time: 1040-1104 PT Time Calculation (min) (ACUTE ONLY): 24 min  Charges:  $Therapeutic Activity: 23-37 mins                     Governor Rooks, PTA Acute Rehabilitation Services Pager [phone] Office [phone]     Diane Short Eli Hose 04/07/2018, 2:00 PM

## 2018-04-07 NOTE — Progress Notes (Signed)
  NEUROSURGERY PROGRESS NOTE   No issues overnight.   EXAM:  BP (!) 150/67   Pulse 95   Temp 98.1 F (36.7 C) (Oral)   Resp 14   Ht 5\' 4"  (1.626 m)   Wt 102 kg   LMP 08/14/2010 (Exact Date)   SpO2 99%   BMI 38.60 kg/m   Awake, alert Speech fluent CN grossly intact  Strength symmetric, generalized weakness  IMPRESSION:  62 y.o. female SAH d# 15, neurologically stable.  Dysphagia, tolerating modified diet  PLAN: - consult to PMR, transfer to CIR when bed available

## 2018-04-07 NOTE — Consult Note (Signed)
Physical Medicine and Rehabilitation Consult Reason for Consult: Decreased functional mobility Referring Physician: [facility]   HPI: Diane Short is a 62 y.o. right-handed female who is married and reported to be independent up until few weeks ago with complicated medical history to include progressive stage IV breast cancer followed by Dr. Hinton Rao with current completion of second cycle of chemotherapy for palliative management.  Patient recently had radiation stereotactic radiosurgery for a single solitary brain met as well as history of 5 intracerebral aneurysms 3 of which treated endovascularly.  Noted progressive decline over the last few weeks with intermittent headaches..  She was seen by EMS 03/22/2018 taken to [facility] for unresponsive episode requiring intubation.  CT scan showed subarachnoid hemorrhage and subsequently transferred to [facility] for further treatment.  EEG completed showing general background slowing and no epileptiform activity noted.  Echocardiogram with ejection fraction of 33% grade 1 diastolic dysfunction.  CT angiogram of head and neck showed increased volume of subarachnoid hemorrhage predominantly located in the basal cisterns and sylvian fissures with intraventricular extension in a pattern most consistent with aneurysmal hemorrhage.  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 Kathyrn Sheriff.  Patient remained intubated until 04/03/2018.  Remained on Keppra for seizure prophylaxis.Nimotop ongoing for blood pressure control.  Subcutaneous Lovenox initiated for DVT prophylaxis 03/31/2018.  Patient is currently completing a course of Unasyn for aspiration pneumonia while maintained on a dysphagia #1 honey thick liquid diet.  Therapy evaluation completed 04/06/2018 with recommendations of physical medicine rehab consult.  SLP working with pt on swallow Husband at bedside, stating pt hasn't eaten "enough for a bird" since Sunday RN  at bedside indicating constant diarrhea requiring rectal tube Review of Systems  Constitutional: Negative for chills and fever.  HENT: Negative for hearing loss.   Eyes: Positive for blurred vision.  Respiratory: Negative for cough and shortness of breath.   Cardiovascular: Negative for chest pain, palpitations and leg swelling.  Gastrointestinal: Positive for nausea.       GERD  Genitourinary: Negative for dysuria, flank pain and hematuria.  Musculoskeletal: Positive for myalgias.  Skin: Negative for rash.  Neurological: Positive for headaches.  Psychiatric/Behavioral: Positive for depression.       Anxiety  All other systems reviewed and are negative.  Past Medical History:  Diagnosis Date  . 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 Anxiety   . Arthritis   . Cancer [facility])    breast cancer  . Cerebral hemorrhage ([place]) 1986  . Depression    due to cancer dx  . Diverticulosis   . GERD (gastroesophageal reflux disease)   . Headache   . History of hiatal hernia   . Hypertension   . 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 Neutropenic fever ([place])    Chemo related  . Osteonecrosis ([place]) 2017-2018   Right Jaw bone  . Thrush, oral    Chemo related   Past Surgical History:  Procedure Laterality Date  . BREAST SURGERY Left 11/2016   metastic to bone  . CATARACT EXTRACTION Right 03/02/2017  . CEREBRAL ANGIOGRAM  1986  . CESAREAN SECTION  1980  . IR 3D INDEPENDENT [facility]ST  01/20/2017  . IR 3D INDEPENDENT [facility]ST  05/26/2017  . IR ANGIO INTRA EXTRACRAN SEL COM CAROTID INNOMINATE UNI R MOD SED  04/09/2017  . IR ANGIO INTRA EXTRACRAN SEL INTERNAL CAROTID BILAT MOD SED  01/20/2017  . IR ANGIO INTRA EXTRACRAN SEL  INTERNAL CAROTID BILAT MOD SED  11/17/2017  . IR ANGIO INTRA EXTRACRAN SEL INTERNAL CAROTID BILAT MOD SED  03/23/2018  . IR ANGIO INTRA EXTRACRAN SEL INTERNAL CAROTID UNI R MOD SED  03/13/2017  . IR ANGIO VERTEBRAL SEL VERTEBRAL BILAT MOD SED  01/20/2017  . IR ANGIO VERTEBRAL SEL VERTEBRAL UNI L MOD SED   05/26/2017  . IR ANGIO VERTEBRAL SEL VERTEBRAL UNI L MOD SED  11/17/2017  . IR ANGIOGRAM FOLLOW UP STUDY  03/13/2017  . IR ANGIOGRAM FOLLOW UP STUDY  03/13/2017  . IR ANGIOGRAM FOLLOW UP STUDY  03/13/2017  . IR ANGIOGRAM FOLLOW UP STUDY  03/13/2017  . IR ANGIOGRAM FOLLOW UP STUDY  03/13/2017  . IR ANGIOGRAM FOLLOW UP STUDY  03/13/2017  . IR ANGIOGRAM FOLLOW UP STUDY  03/13/2017  . IR ANGIOGRAM FOLLOW UP STUDY  03/13/2017  . IR ANGIOGRAM FOLLOW UP STUDY  03/13/2017  . IR ANGIOGRAM FOLLOW UP STUDY  03/13/2017  . IR ANGIOGRAM FOLLOW UP STUDY  03/13/2017  . IR ANGIOGRAM FOLLOW UP STUDY  03/13/2017  . IR ANGIOGRAM FOLLOW UP STUDY  04/09/2017  . IR ANGIOGRAM FOLLOW UP STUDY  05/26/2017  . IR ANGIOGRAM FOLLOW UP STUDY  05/26/2017  . IR ANGIOGRAM FOLLOW UP STUDY  03/23/2018  . IR NEURO EACH ADD'L AFTER BASIC UNI LEFT (MS)  04/09/2017  . IR NEURO EACH ADD'L AFTER BASIC UNI LEFT (MS)  03/23/2018  . IR NEURO EACH ADD'L AFTER BASIC UNI RIGHT (MS)  03/13/2017  . IR TRANSCATH/EMBOLIZ  03/13/2017  . IR TRANSCATH/EMBOLIZ  04/09/2017  . IR TRANSCATH/EMBOLIZ  05/26/2017  . IR TRANSCATH/EMBOLIZ  03/23/2018  . IR US GUIDE VASC ACCESS RIGHT  04/09/2017  . MASTECTOMY Left   . port a cathh  04/2016  . RADIOLOGY WITH ANESTHESIA N/A 03/13/2017   Procedure: stent coiling of right MCA aneurysm;  Surgeon: Consuella Lose, MD;  Location: [place];  Service: Radiology;  Laterality: N/A;  . RADIOLOGY WITH ANESTHESIA N/A 04/09/2017   Procedure: Arteriogram, pipeline embolization of aneruysm;  Surgeon: Consuella Lose, MD;  Location: [place];  Service: Radiology;  Laterality: N/A;  . RADIOLOGY WITH ANESTHESIA N/A 05/26/2017   Procedure: Stent supported coil embolization;  Surgeon: Consuella Lose, MD;  Location: [place];  Service: Radiology;  Laterality: N/A;  . RADIOLOGY WITH ANESTHESIA N/A 03/23/2018   Procedure: IR WITH ANESTHESIA;  Surgeon: Consuella Lose, MD;  Location: [place];  Service: Radiology;  Laterality: N/A;  . TUBAL  LIGATION  1983   Family History  Problem Relation Age of Onset  . Alzheimer's disease Mother   . Colon cancer Father   . Cerebral aneurysm Father   . Diabetes Sister   . Stomach cancer Brother   . Esophageal cancer Brother   . Multiple myeloma Brother   . Hypertension Sister    Social History:  reports that she has been smoking cigarettes. She has a 21.00 pack-year smoking history. She has never used smokeless tobacco. She reports that she does not drink alcohol or use drugs. Allergies:  Allergies  Allergen Reactions  . Nsaids Other (See Comments)    GI upset   . Ace Inhibitors Other (See Comments)    cough   Medications Prior to Admission  Medication Sig Dispense Refill  . acetaminophen (TYLENOL) 500 MG tablet Take 1,000 mg by mouth 2 (two) times daily as needed for mild pain.     Marland Kitchen aspirin 81 MG tablet Take 81 mg by mouth daily.     Marland Kitchen  Calcium Citrate-Vitamin D (CALCIUM CITRATE + D3 PO) Take 1-2 tablets by mouth 2 (two) times daily. Takes 2 in the am and 1 in the evening    . carvedilol (COREG) 25 MG tablet Take 25 mg by mouth 2 (two) times daily with a meal.    . citalopram (CELEXA) 20 MG tablet Take 20 mg by mouth daily.    . diphenhydrAMINE (BENADRYL) 25 MG tablet Take 25 mg by mouth at bedtime as needed for sleep.    Marland Kitchen loperamide (IMODIUM A-D) 2 MG tablet Take 2-4 mg by mouth daily as needed for diarrhea or loose stools.    Marland Kitchen loratadine (CLARITIN) 10 MG tablet Take 10 mg by mouth daily.    Marland Kitchen LORazepam (ATIVAN) 0.5 MG tablet Take 0.5 mg by mouth every 4 (four) hours as needed for anxiety.    Marland Kitchen omeprazole (PRILOSEC) 20 MG capsule Take 20 mg by mouth daily.    . ondansetron (ZOFRAN) 4 MG tablet Take 4 mg by mouth every 4 (four) hours as needed for nausea or vomiting.    . prochlorperazine (COMPAZINE) 10 MG tablet Take 10 mg by mouth every 6 (six) hours as needed for nausea or vomiting.    . traMADol (ULTRAM) 50 MG tablet Take 50 mg by mouth every 6 (six) hours as needed for  moderate pain.      Home: Home Living Family/patient expects to be discharged to:: Private residence Living Arrangements: Spouse/significant other Available Help at Discharge: Family, Available 24 hours/day Type of Home: House Home Access: Level entry Joice: One level, Other (Comment)(1 step to den/kitchen) Bathroom Shower/Tub: Tub/shower unit, Architectural technologist: Standard Bathroom Accessibility: Yes Home Equipment: Grab bars - tub/shower  Lives With: Spouse  Functional History: Prior Function Level of Independence: Independent Comments: drove; husband occasionally helpes with drying off legs due to unsteadiness Functional Status:  Mobility: Bed Mobility Overal bed mobility: Needs Assistance Bed Mobility: Supine to Sit, Sit to Supine Supine to sit: Max assist, +2 for physical assistance Sit to supine: Max assist, +2 for physical assistance General bed mobility comments: Pt initiates movement for bed mobility;  Transfers Overall transfer level: Needs assistance Equipment used: 2 person hand held assist Transfers: Sit to/from Stand Sit to Stand: +2 physical assistance, Max assist General transfer comment: gati belt and bed pad used Ambulation/Gait General Gait Details: not able    ADL: ADL Overall ADL's : Needs assistance/impaired Eating/Feeding: Maximal assistance Grooming: Maximal assistance Upper Body Bathing: Maximal assistance, Bed level Lower Body Bathing: Total assistance Upper Body Dressing : Total assistance Lower Body Dressing: Total assistance, Bed level Functional mobility during ADLs: Maximal assistance, +2 for physical assistance(sit - stand) General ADL Comments: increased attempts at participation once upright in bed; easily distracted during ADL  Cognition: Cognition Overall Cognitive Status: Impaired/Different from baseline Arousal/Alertness: Awake/alert Orientation Level: Oriented to person Attention: Focused, Sustained Focused  Attention: Appears intact Sustained Attention: Impaired Sustained Attention Impairment: Verbal basic, Functional basic Memory: Impaired Memory Impairment: Storage deficit Awareness: Impaired Awareness Impairment: Emergent impairment Problem Solving: Impaired Problem Solving Impairment: Verbal basic, Functional basic Executive Function: Self Monitoring, Initiating Initiating: Impaired Initiating Impairment: Verbal basic, Functional basic Self Monitoring: Impaired Self Monitoring Impairment: Verbal basic, Functional basic Cognition Arousal/Alertness: Awake/alert Behavior During Therapy: Flat affect Overall Cognitive Status: Impaired/Different from baseline Area of Impairment: Orientation, Attention, Memory, Following commands, Safety/judgement, Awareness, Problem solving Orientation Level: Disoriented to, Place([facility]) Current Attention Level: Sustained Memory: Decreased recall of precautions, Decreased short-term memory Following Commands: Follows one step  commands with increased time Safety/Judgement: Decreased awareness of safety, Decreased awareness of deficits Awareness: Intellectual Problem Solving: Slow processing, Decreased initiation, Difficulty sequencing, Requires verbal cues, Requires tactile cues  Blood pressure (!) 162/63, pulse 95, temperature 99.6 F (37.6 C), temperature source Oral, resp. rate 14, height '5\' 4"'  (1.626 m), weight 102 kg, last menstrual period 08/14/2010, SpO2 99 %. Physical Exam  Vitals reviewed. HENT:  Head: Normocephalic.  Eyes:  Pupils reactive to light  Neck: Normal range of motion. Neck supple. No thyromegaly present.  Cardiovascular: Normal rate and regular rhythm.  Respiratory: Effort normal and breath sounds normal. No respiratory distress.  GI: Soft. Bowel sounds are normal. She exhibits no distension.  Neurological:  Patient is a bit lethargic but arousable.  Husband at bedside.  She will state her name and age.  Follows simple commands   Patient has difficulty with naming fingers correctly.  Cannot follow commands for visual field confrontation testing. Sensation cannot be assessed secondary to aphasia. Patient has long latency prior to response with single word answers. Motor strength is 0/5 in the right deltoid bicep tricep grip, 2- hip knee extensor synergy in the right lower limb otherwise 0/5 Left upper limb and left lower limb are 4/5   Results for orders placed or performed during the hospital encounter of 03/22/18 (from the past 24 hour(s))  Glucose, capillary     Status: Abnormal   Collection Time: 04/06/18  7:50 AM  Result Value Ref Range   Glucose-Capillary 126 (H) 70 - 99 mg/dL   Comment 1 Notify RN    Comment 2 Document in Chart   Glucose, capillary     Status: Abnormal   Collection Time: 04/06/18 12:24 PM  Result Value Ref Range   Glucose-Capillary 141 (H) 70 - 99 mg/dL   Comment 1 Notify RN    Comment 2 Document in Chart   Glucose, capillary     Status: Abnormal   Collection Time: 04/06/18  4:30 PM  Result Value Ref Range   Glucose-Capillary 110 (H) 70 - 99 mg/dL   Comment 1 Notify RN    Comment 2 Document in Chart   Glucose, capillary     Status: Abnormal   Collection Time: 04/06/18  9:26 PM  Result Value Ref Range   Glucose-Capillary 140 (H) 70 - 99 mg/dL   Dg Swallowing Func-speech Pathology  Result Date: 04/05/2018 Objective Swallowing Evaluation: Type of Study: MBS-Modified Barium Swallow Study  Patient Details Name: Diane Short MRN: [id number] Date of Birth: 20-Mar-1956 Today's Date: 04/05/2018 Time: SLP Start Time (ACUTE ONLY): 1110 -SLP Stop Time (ACUTE ONLY): 1136 SLP Time Calculation (min) (ACUTE ONLY): 26 min Past Medical History: Past Medical History: Diagnosis Date . Anemia   due to chemo . Anxiety  . Arthritis  . Cancer [facility])   breast cancer . Cerebral hemorrhage ([place]) 1986 . Depression   due to cancer dx . Diverticulosis  . GERD (gastroesophageal reflux disease)  . Headache  .  History of hiatal hernia  . Hypertension  . 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 Neutropenic fever ([place])   Chemo related . Osteonecrosis ([place]) 2017-2018  Right Jaw bone . Thrush, oral   Chemo related Past Surgical History: Past Surgical History: Procedure Laterality Date . BREAST SURGERY Left 11/2016  metastic to bone . CATARACT EXTRACTION Right 03/02/2017 . CEREBRAL ANGIOGRAM  1986 . CESAREAN SECTION  1980 . IR 3D INDEPENDENT [facility]KST  01/20/2017 . IR 3D INDEPENDENT [facility]KST  05/26/2017 . IR ANGIO INTRA  EXTRACRAN SEL COM CAROTID INNOMINATE UNI R MOD SED  04/09/2017 . IR ANGIO INTRA EXTRACRAN SEL INTERNAL CAROTID BILAT MOD SED  01/20/2017 . IR ANGIO INTRA EXTRACRAN SEL INTERNAL CAROTID BILAT MOD SED  11/17/2017 . IR ANGIO INTRA EXTRACRAN SEL INTERNAL CAROTID BILAT MOD SED  03/23/2018 . IR ANGIO INTRA EXTRACRAN SEL INTERNAL CAROTID UNI R MOD SED  03/13/2017 . IR ANGIO VERTEBRAL SEL VERTEBRAL BILAT MOD SED  01/20/2017 . IR ANGIO VERTEBRAL SEL VERTEBRAL UNI L MOD SED  05/26/2017 . IR ANGIO VERTEBRAL SEL VERTEBRAL UNI L MOD SED  11/17/2017 . IR ANGIOGRAM FOLLOW UP STUDY  03/13/2017 . IR ANGIOGRAM FOLLOW UP STUDY  03/13/2017 . IR ANGIOGRAM FOLLOW UP STUDY  03/13/2017 . IR ANGIOGRAM FOLLOW UP STUDY  03/13/2017 . IR ANGIOGRAM FOLLOW UP STUDY  03/13/2017 . IR ANGIOGRAM FOLLOW UP STUDY  03/13/2017 . IR ANGIOGRAM FOLLOW UP STUDY  03/13/2017 . IR ANGIOGRAM FOLLOW UP STUDY  03/13/2017 . IR ANGIOGRAM FOLLOW UP STUDY  03/13/2017 . IR ANGIOGRAM FOLLOW UP STUDY  03/13/2017 . IR ANGIOGRAM FOLLOW UP STUDY  03/13/2017 . IR ANGIOGRAM FOLLOW UP STUDY  03/13/2017 . IR ANGIOGRAM FOLLOW UP STUDY  04/09/2017 . IR ANGIOGRAM FOLLOW UP STUDY  05/26/2017 . IR ANGIOGRAM FOLLOW UP STUDY  05/26/2017 . IR ANGIOGRAM FOLLOW UP STUDY  03/23/2018 . IR NEURO EACH ADD'L AFTER BASIC UNI LEFT (MS)  04/09/2017 . IR NEURO EACH ADD'L AFTER BASIC UNI LEFT (MS)  03/23/2018 . IR NEURO EACH ADD'L AFTER BASIC UNI RIGHT (MS)  03/13/2017 . IR TRANSCATH/EMBOLIZ  03/13/2017 . IR TRANSCATH/EMBOLIZ  04/09/2017 . IR  TRANSCATH/EMBOLIZ  05/26/2017 . IR TRANSCATH/EMBOLIZ  03/23/2018 . IR US GUIDE VASC ACCESS RIGHT  04/09/2017 . MASTECTOMY Left  . port a cathh  04/2016 . RADIOLOGY WITH ANESTHESIA N/A 03/13/2017  Procedure: stent coiling of right MCA aneurysm;  Surgeon: Consuella Lose, MD;  Location: [place];  Service: Radiology;  Laterality: N/A; . RADIOLOGY WITH ANESTHESIA N/A 04/09/2017  Procedure: Arteriogram, pipeline embolization of aneruysm;  Surgeon: Consuella Lose, MD;  Location: [place];  Service: Radiology;  Laterality: N/A; . RADIOLOGY WITH ANESTHESIA N/A 05/26/2017  Procedure: Stent supported coil embolization;  Surgeon: Consuella Lose, MD;  Location: [place];  Service: Radiology;  Laterality: N/A; . RADIOLOGY WITH ANESTHESIA N/A 03/23/2018  Procedure: IR WITH ANESTHESIA;  Surgeon: Consuella Lose, MD;  Location: [place];  Service: Radiology;  Laterality: N/A; . TUBAL LIGATION  1983 HPI: 62 yo female hx smoking, stage 4 breast cancer with brain met tx with chemo and brain XRT, cerebral aneurysms s/p intervention presented with persistent headache, and altered mental status. Found to have stable intraventricular hemorrhage as well as subarachnoid hemorrhage concentrated in left prepontine cistern and left CP angle cistern. Intubated 9/9-9/11 and re-intubated 9/11-9/21. CXR 9/22 mild patchy/platelike lower lobe opacities, likely atelectasis. Multiple attempts at NGT unsuccessful and pt has crucial po medication.   No data recorded Assessment / Plan / Recommendation CHL IP CLINICAL IMPRESSIONS 04/05/2018 Clinical Impression Pt demonstrates a moderate post extubation related dysphagia complicated by cognitive deficits. Pt needs total hand over hand assist to self feed due to weakness, verbal cues to take straw sips, verbal cues to initaite swallow. She orally holds most boluses for several seconds with good oral containment but intermittent slight delay in swallow initiation. With larger boluses of nectar this does  result in trace sensed aspiration events before/during the swallow, apparently via the interarytenoid space where some glottic incompetence may be present after intubation. Study was  quite limited as pt appeared to become nauseated. Pt is recommended to initiate a conservative diet of puree and and honey thick liquids with upgrade at bedside given consistent sensation on exam. Be aware of potential for esophageal dysphagia, pt did have one instance of trace backflow that she sensed and swallowed.  SLP Visit Diagnosis Dysphagia, oropharyngeal phase (R13.12) Attention and concentration deficit following -- Frontal lobe and executive function deficit following -- Impact on safety and function Mild aspiration risk   CHL IP TREATMENT RECOMMENDATION 04/05/2018 Treatment Recommendations Therapy as outlined in treatment plan below   Prognosis 04/05/2018 Prognosis for Safe Diet Advancement Good Barriers to Reach Goals Cognitive deficits Barriers/Prognosis Comment -- CHL IP DIET RECOMMENDATION 04/05/2018 SLP Diet Recommendations Dysphagia 1 (Puree) solids;Honey thick liquids Liquid Administration via Cup;Straw Medication Administration Crushed with puree Compensations Slow rate;Small sips/bites Postural Changes Seated upright at 90 degrees;Remain semi-upright after after feeds/meals (Comment)   CHL IP OTHER RECOMMENDATIONS 04/05/2018 Recommended Consults -- Oral Care Recommendations Oral care BID Other Recommendations Order thickener from pharmacy   CHL IP FOLLOW UP RECOMMENDATIONS 04/05/2018 Follow up Recommendations Inpatient Rehab   CHL IP FREQUENCY AND DURATION 04/05/2018 Speech Therapy Frequency (ACUTE ONLY) min 2x/week Treatment Duration 2 weeks      CHL IP ORAL PHASE 04/05/2018 Oral Phase Impaired Oral - Pudding Teaspoon -- Oral - Pudding Cup -- Oral - Honey Teaspoon -- Oral - Honey Cup -- Oral - Nectar Teaspoon Reduced posterior propulsion;Holding of bolus Oral - Nectar Cup Reduced posterior propulsion;Holding of bolus Oral  - Nectar Straw Reduced posterior propulsion;Holding of bolus Oral - Thin Teaspoon -- Oral - Thin Cup -- Oral - Thin Straw -- Oral - Puree Reduced posterior propulsion;Holding of bolus Oral - Mech Soft -- Oral - Regular -- Oral - Multi-Consistency -- Oral - Pill -- Oral Phase - Comment --  CHL IP PHARYNGEAL PHASE 04/05/2018 Pharyngeal Phase Impaired Pharyngeal- Pudding Teaspoon -- Pharyngeal -- Pharyngeal- Pudding Cup -- Pharyngeal -- Pharyngeal- Honey Teaspoon -- Pharyngeal -- Pharyngeal- Honey Cup -- Pharyngeal -- Pharyngeal- Nectar Teaspoon WFL Pharyngeal -- Pharyngeal- Nectar Cup Penetration/Aspiration before swallow;Penetration/Aspiration during swallow;Reduced airway/laryngeal closure;Trace aspiration Pharyngeal Material enters airway, passes BELOW cords then ejected out Pharyngeal- Nectar Straw Penetration/Aspiration before swallow;Penetration/Aspiration during swallow;Reduced airway/laryngeal closure;Trace aspiration Pharyngeal Material enters airway, passes BELOW cords then ejected out;Material does not enter airway Pharyngeal- Thin Teaspoon -- Pharyngeal -- Pharyngeal- Thin Cup -- Pharyngeal -- Pharyngeal- Thin Straw -- Pharyngeal -- Pharyngeal- Puree WFL Pharyngeal -- Pharyngeal- Mechanical Soft -- Pharyngeal -- Pharyngeal- Regular -- Pharyngeal -- Pharyngeal- Multi-consistency -- Pharyngeal -- Pharyngeal- Pill -- Pharyngeal -- Pharyngeal Comment --  No flowsheet data found. Herbie [place], [place] CCC-SLP [phone] [place], Katherene Ponto 04/05/2018, 12:20 PM               Assessment/Plan: Diagnosis: Right hemiplegia, aphasia, severe cognitive deficits and dysphagia secondary to aneurysmal bleed in a patient with stage IV breast CA who has finished a second round of palliative chemotherapy 1. Does the need for close, 24 hr/day medical supervision in concert with the patient's rehab needs make it unreasonable for this patient to be served in a less intensive setting? Potentially 2. Co-Morbidities requiring  supervision/potential complications: Bowel incontinence and diarrhea requiring rectal tube, urinary retention, at risk for aspiration pneumonia 3. Due to bladder management, bowel management, safety, skin/wound care, disease management, medication administration, pain management and patient education, does the patient require 24 hr/day rehab nursing? Potentially 4. Does the patient require coordinated care of a physician, rehab  nurse, PT, OT, speech to address physical and functional deficits in the context of the above medical diagnosis(es)? Potentially Addressing deficits in the following areas: balance, endurance, locomotion, strength, transferring, bowel/bladder control, bathing, dressing, feeding, grooming, toileting, cognition, speech, language, swallowing and psychosocial support 5. Can the patient actively participate in an intensive therapy program of at least 3 hrs of therapy per day at least 5 days per week? No 6. The potential for patient to make measurable gains while on inpatient rehab is poor 7. Anticipated functional outcomes upon discharge from inpatient rehab are n/a  with PT, n/a with OT, n/a with SLP. 8. Estimated rehab length of stay to reach the above functional goals is: deferred 9. Anticipated D/C setting: home vs SNF 10. Anticipated post D/C treatments: see below 11. Overall Rehab/Functional Prognosis: poor  RECOMMENDATIONS: This patient's condition is appropriate for continued rehabilitative care in the following setting: acute care setting Patient has agreed to participate in recommended program. N/A Note that insurance prior authorization may be required for reimbursement for recommended care.  Comment: Discussion with patient's husband, SLP and nursing in the room.  We discussed poor endurance and inability to tolerate an intensive rehabilitation program.  We also discussed very poor intake which is a combination between oral pharyngeal dysphagia as well as severe  cognitive deficits from the aneurysmal bleed.  We discussed that adequate nutrition may require PEG and patient's husband will need to decide whether he wants to pursue this.  We discussed that palliative care may help establish this goal and other goals of care given complexity of her medical condition Stage IV, breast CA.  We discussed that only a partial recovery is expected.  "I have personally performed a face to face diagnostic evaluation of this patient.  Additionally, I have reviewed and concur with the physician assistant's documentation above." Charlett Blake M.D. [facility] FAAPM&R (Sports Med, Neuromuscular Med) Diplomate Am Board of Electrodiagnostic Med  Elizabeth Sauer 04/07/2018

## 2018-04-07 NOTE — Progress Notes (Signed)
Inpatient Rehabilitation-Admissions Coordinator   Please see PM&R consult regarding further recommendations. Pt is currently unable to tolerate an intensive therapy program. [facility] has alerted CM to need for new dispo plans.   AC will sign off at this time.   Jhonnie Garner, OTR/L  Rehab Admissions Coordinator  [phone] 04/07/2018 11:25 AM

## 2018-04-07 NOTE — Progress Notes (Signed)
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  Patient intubated in the ED for airway protection, subsequently started on nimodipine.  Patient was admitted to the ICU, neurosurgery was consulted, status post coiling and embolization of PICA aneurysm.  Patient successfully extubated on 9/21.  Working with PT recommending CIR however not candidate.  Palliative care consulted for goals of care.  Significant events 9/10 cerebral angiogram with coiling 9/11 extubated >> reintubated due to stridor and start steroids; start ABx for UTI, aspiration 9/13 wheezing, respiratory acidosis >> paralytic x 1, deep sedation. 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 On fent, versed and diprivan. On Cardene  For goal sbp < 160.  On tube feeds. Lot of 3rd spacing.  +. Rt hand bruising - worse per RN but pulse rt radial, rt ulnar and cap refill on right hand. Started lasix 9/16 remained paralyzed, diuresed, attempt to get paralytics off  9/17 diuresed very well overnight but Na increased, tolerated nimbex off, minimizing sedation and holding diureses for today, decrease PEEP and FiO2 to 5 and 30%. 9/20 diuresed well and beginning active weaning 9/21 Net negative 8 L, weaning well on 5/5>> Extubated 9/22 failed SLP, off precedex  9/23 MBS-> dys 1 diet; urinary retention  Subjective: Patient seen and examined, she is out of bed sitting in chair.  Stable, report no appetite.  Husband concerned about tremors.  Assessment & Plan: Acute respiratory failure with hypoxia, hypercapnia- resolved  Postextubation stridor 9/11; extubated 9/21 Aspiration PNA Currently on  supplemental oxygen, SLP mild aspiration risk, she was treated with 3 days of antibiotics for aspiration pneumonia with Unasyn. Now spiking fever.  Continue aggressive pulmonary toilet.   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 POD #16 Nimotop and TCDs per NSGY Neuro checks as needed Seizure precautions Continue keppra Per neurology tremors are not related to seizures. Keep systolic blood pressure below 180 Continuing Norvasc 10 mg PO, coreg 59m BID, labetalol 1046mTID, catapress 0.79m59mpatch apresoline / lopressor PRN  Acute metabolic encephalopathy Resolved  Aspiration pneumonia vs bilateral atelectasis  Patient was treated with Unasyn, for 3 days.  Antibiotics stopped, patient spiked fever today.  We will continue to monitor off antibiotics for now.  Tylenol for fever.  Continue supportive treatment.  E coli UTI Completed course of Rocephin on 9/17  Dysphagia SLP recommending dysphagia 1 diet Nutrition consult  Deconditioning PT was consulted, recommending CIR.  CAR evaluated patient and deemed patient not candidate for intensive rehab, as patient has multiple comorbidities and not strong enough to participate in intensive therapy.  Poor nutrition will not helping with recovery.  Acute urinary retention Foley was reinserted 9/24 due to urinary retention.  Will check for UTI.  Patient with rectal pouch at risk for urinary tract infection.  Stage 4 breast cancer s/p mastectomy, XRT to brain met, and recently on chemo Chemo induced anemia and thrombocytopenia.  Outpatient oncology follow-up   Goals of care Physical rehabilitation MD mentioned to patient that she may be candidate for PEG tube feeding in order to improve nutrition.  I have discussed with patient and husband regarding goals of care.  I do not advise PEG  tube feeding in this patient with multiple comorbidities.  Palliative care will be consulted.  DVT prophylaxis: SCDs Code Status: Full  code Family Communication: Husband at bedside Disposition Plan: Transferred to stepdown  Consultants:   Neurosurgery  PCCM  PMR  Palliative care  Antimicrobials: Anti-infectives (From admission, onward)   Start     Dose/Rate Route Frequency Ordered Stop   04/04/18 1200  Ampicillin-Sulbactam (UNASYN) 3 g in sodium chloride 0.9 % 100 mL IVPB     3 g 200 mL/hr over 30 Minutes Intravenous Every 6 hours 04/04/18 1110 04/06/18 1750   03/26/18 0830  cefTRIAXone (ROCEPHIN) 1 g in sodium chloride 0.9 % 100 mL IVPB  Status:  Discontinued     1 g 200 mL/hr over 30 Minutes Intravenous Every 24 hours 03/26/18 0825 03/30/18 1003   03/24/18 1300  piperacillin-tazobactam (ZOSYN) IVPB 3.375 g  Status:  Discontinued     3.375 g 12.5 mL/hr over 240 Minutes Intravenous Every 8 hours 03/24/18 1202 03/26/18 0825   03/24/18 1030  cefTRIAXone (ROCEPHIN) 1 g in sodium chloride 0.9 % 100 mL IVPB  Status:  Discontinued     1 g 200 mL/hr over 30 Minutes Intravenous Every 24 hours 03/24/18 1018 03/24/18 1143         Objective: Vitals:   04/07/18 0500 04/07/18 0600 04/07/18 0700 04/07/18 0800  BP: (!) 162/63 (!) 168/59 (!) 150/67   Pulse: 95 93 95   Resp:      Temp:    98.1 F (36.7 C)  TempSrc:    Oral  SpO2: 99% 99% 99%   Weight:      Height:        Intake/Output Summary (Last 24 hours) at 04/07/2018 1205 Last data filed at 04/07/2018 0600 Gross per 24 hour  Intake 200 ml  Output 775 ml  Net -575 ml   Filed Weights   03/22/18 0819 03/23/18 0404  Weight: 90.7 kg 102 kg    Examination:  General exam: NAD, tremors  Respiratory system: Decrease breath sounds, no wheezing or crackles  Cardiovascular system: S1 & S2 heard, RRR. No JVD, murmurs, rubs or gallops Gastrointestinal system: Abdomen is nondistended, soft and nontender.  Central nervous system: Alert and oriented. Follow commands appropriately  Extremities: Trace lower extremity edema  Skin: No rashes, lesions or  ulcers Psychiatry: Mood & affect appropriate.   Data Reviewed: I have personally reviewed following labs and imaging studies  CBC: Recent Labs  Lab 04/02/18 0500 04/03/18 0510 04/04/18 0506 04/05/18 0525 04/06/18 0508  WBC 8.7 11.0* 12.4* 10.1 7.2  HGB 7.6* 8.2* 8.3* 8.2* 8.2*  HCT 25.4* 26.0* 26.7* 26.9* 27.7*  MCV 102.8* 97.4 98.2 99.3 103.0*  PLT 172 186 224 266 062   Basic Metabolic Panel: Recent Labs  Lab 04/01/18 0435 04/02/18 0500  04/03/18 0510  04/04/18 0506 04/04/18 2148 04/05/18 0525 04/06/18 0508 04/06/18 0513 04/07/18 0448  NA 155* 146*   < > 136   < > 139 138 143  --  150* 155*  K 3.4* 3.1*   < > 2.8*   < > 2.4* 2.2* 2.8*  --  2.9* 3.0*  CL 117* 102   < > 85*   < > 92* 101 102  --  114* 121*  CO2 28 30   < > 34*   < > '30 27 27  ' --  24 22  GLUCOSE 157* 225*   < > 212*   < > 112* 140* 120*  --  156* 129*  BUN 34* 33*   < > 35*   < > 32* 22* 19  --  14 14  CREATININE 0.63 0.84   < > 0.94   < > 0.93 0.87 0.88  --  0.79 0.76  CALCIUM 8.9 8.9   < > 9.1   < > 9.1 8.9 9.0  --  8.8* 8.7*  MG 2.2 1.6*  --  2.0  --   --   --   --  2.2  --  2.2  PHOS 3.2 3.6  --  5.4*  --   --   --   --   --  3.1 3.1   < > = values in this interval not displayed.   GFR: Estimated Creatinine Clearance: 86.9 mL/min (by C-G formula based on SCr of 0.76 mg/dL). Liver Function Tests: Recent Labs  Lab 04/03/18 0510 04/06/18 0513 04/07/18 0448  AST 29  --   --   ALT 40  --   --   ALKPHOS 85  --   --   BILITOT 0.7  --   --   PROT 6.2*  --   --   ALBUMIN 2.6* 2.7* 2.7*   No results for input(s): LIPASE, AMYLASE in the last 168 hours. No results for input(s): AMMONIA in the last 168 hours. Coagulation Profile: No results for input(s): INR, PROTIME in the last 168 hours. Cardiac Enzymes: No results for input(s): CKTOTAL, CKMB, CKMBINDEX, TROPONINI in the last 168 hours. BNP (last 3 results) No results for input(s): PROBNP in the last 8760 hours. HbA1C: No results for  input(s): HGBA1C in the last 72 hours. CBG: Recent Labs  Lab 04/06/18 0750 04/06/18 1224 04/06/18 1630 04/06/18 2126 04/07/18 0735  GLUCAP 126* 141* 110* 140* 125*   Lipid Profile: No results for input(s): CHOL, HDL, LDLCALC, TRIG, CHOLHDL, LDLDIRECT in the last 72 hours. Thyroid Function Tests: No results for input(s): TSH, T4TOTAL, FREET4, T3FREE, THYROIDAB in the last 72 hours. Anemia Panel: No results for input(s): VITAMINB12, FOLATE, FERRITIN, TIBC, IRON, RETICCTPCT in the last 72 hours. Sepsis Labs: Recent Labs  Lab 04/02/18 1026 04/03/18 0510 04/04/18 0506  PROCALCITON 0.26 0.36 0.44    Recent Results (from the past 240 hour(s))  Culture, blood (routine x 2)     Status: None (Preliminary result)   Collection Time: 04/02/18 10:26 AM  Result Value Ref Range Status   Specimen Description BLOOD FOOT RIGHT  Final   Special Requests   Final    BOTTLES DRAWN AEROBIC ONLY Blood Culture adequate volume   Culture   Final    NO GROWTH 4 DAYS Performed at [facility], [address] [place], [place] [address]    Report Status PENDING  Incomplete  Culture, blood (routine x 2)     Status: None (Preliminary result)   Collection Time: 04/02/18 10:32 AM  Result Value Ref Range Status   Specimen Description BLOOD FOOT LEFT  Final   Special Requests   Final    BOTTLES DRAWN AEROBIC ONLY Blood Culture adequate volume   Culture   Final    NO GROWTH 4 DAYS Performed at [facility], [place] [address], [place], [place] [address]    Report Status PENDING  Incomplete  Culture, respiratory (non-expectorated)     Status: None   Collection Time: 04/02/18 11:18 AM  Result Value Ref Range Status   Specimen Description TRACHEAL ASPIRATE  Final   Special Requests NONE  Final  Gram Stain   Final    MODERATE WBC PRESENT, PREDOMINANTLY PMN MODERATE GRAM POSITIVE COCCI MODERATE GRAM POSITIVE RODS    Culture   Final    Consistent with normal respiratory flora. Performed at  [facility], [place] [address] [place], [place] [address]    Report Status 04/04/2018 FINAL  Final  Culture, Urine     Status: None   Collection Time: 04/02/18 12:48 PM  Result Value Ref Range Status   Specimen Description URINE, CATHETERIZED  Final   Special Requests NONE  Final   Culture   Final    NO GROWTH Performed at [facility], [place] [address] [place], [place] [address]    Report Status 04/03/2018 FINAL  Final     Radiology Studies: No results found.  Scheduled Meds: . amLODipine  10 mg Oral Daily  . carvedilol  25 mg Oral BID WC  . chlorhexidine gluconate (MEDLINE KIT)  15 mL Mouth Rinse BID  . Chlorhexidine Gluconate Cloth  6 each Topical Daily  . cloNIDine  0.2 mg Transdermal Weekly  . enoxaparin (LOVENOX) injection  30 mg Subcutaneous Q24H  . famotidine  20 mg Oral Daily  . insulin aspart  0-15 Units Subcutaneous TID WC  . insulin aspart  0-5 Units Subcutaneous QHS  . labetalol  100 mg Oral TID  . levETIRAcetam  1,000 mg Oral BID  . niMODipine  60 mg Oral Q4H   Or  . niMODipine  60 mg Per Tube Q4H  . sodium chloride flush  10-40 mL Intracatheter Q12H   Continuous Infusions:   LOS: 16 days   Time spent: Total of 35 minutes spent with pt, greater than 50% of which was spent in discussion of  treatment, counseling and coordination of care.  Chipper Oman, MD Pager: Text Page via [email or web address]   If 7PM-7AM, please contact night-coverage [email or web address] 04/07/2018, 12:05 PM   Note - This record has been created using [employer]. Chart creation errors have been sought, but may not always have been located. Such creation errors do not reflect on the standard of medical care.

## 2018-04-07 NOTE — Progress Notes (Signed)
  Speech Language Pathology Treatment: Dysphagia;Cognitive-Linquistic  Patient Details Name: Diane Short MRN: [id number] DOB: 1956/02/18 Today's Date: 04/07/2018 Time: 1015-1040 SLP Time Calculation (min) (ACUTE ONLY): 25 min  Assessment / Plan / Recommendation Clinical Impression  Pt was seen for skilled ST targeting cognitive and dysphagia goals.  Pt was resting upon therapist's arrival but awakened after repositioning and with increased time.  Pt needed max verbal and tactile cues to initiate self feeding of her breakfast meal.  Once bolus was contained orally, she needed mod-max verbal cues to initiate a swallow secondary to oral holding in the setting of decreased attention to boluses and decreased task initiation.  No overt s/s of aspiration were evident with purees or liquids.  Pt ate 5 bites of purees and 5 sips of honey thick liquids with max cues to maintain attention to boluses and complete task in a timely manner.  Discussed distraction management techniques and methods to improve pt's initiation and attention to task with pt's husband.  All questions were answered to his satisfaction at this time.  Continue per current plan of care.    HPI HPI: 62 yo female hx smoking, stage 4 breast cancer with brain met tx with chemo and brain XRT, cerebral aneurysms s/p intervention presented with persistent headache, and altered mental status. Found to have stable intraventricular hemorrhage as well as subarachnoid hemorrhage concentrated in left prepontine cistern and left CP angle cistern. Intubated 9/9-9/11 and re-intubated 9/11-9/21. CXR 9/22 mild patchy/platelike lower lobe opacities, likely atelectasis. Multiple attempts at NGT unsuccessful and pt has crucial po medication.        SLP Plan  Continue with current plan of care       Recommendations  Diet recommendations: Dysphagia 1 (puree);Honey-thick liquid Liquids provided via: Cup Medication Administration: Crushed with  puree Supervision: Patient able to self feed;Staff to assist with self feeding;Full supervision/cueing for compensatory strategies Compensations: Slow rate;Small sips/bites Postural Changes and/or Swallow Maneuvers: Seated upright 90 degrees                Oral Care Recommendations: Oral care QID;Oral care prior to ice chip/H20 Follow up Recommendations: Inpatient Rehab SLP Visit Diagnosis: Aphonia (R49.1);Cognitive communication deficit (R41.841);Dysphagia, oropharyngeal phase (R13.12) Plan: Continue with current plan of care       GO                PageSelinda Orion 04/07/2018, 10:43 AM

## 2018-04-08 ENCOUNTER — Other Ambulatory Visit (HOSPITAL_COMMUNITY): Payer: Medicaid Other

## 2018-04-08 ENCOUNTER — Inpatient Hospital Stay (HOSPITAL_COMMUNITY): Payer: Medicaid Other

## 2018-04-08 DIAGNOSIS — Z515 Encounter for palliative care: Secondary | ICD-10-CM

## 2018-04-08 DIAGNOSIS — Z7189 Other specified counseling: Secondary | ICD-10-CM

## 2018-04-08 DIAGNOSIS — R627 Adult failure to thrive: Secondary | ICD-10-CM

## 2018-04-08 LAB — CBC
HEMATOCRIT: 29 % — AB (ref 36.0–46.0)
Hemoglobin: 8.1 g/dL — ABNORMAL LOW (ref 12.0–15.0)
MCH: 30.3 pg (ref 26.0–34.0)
MCHC: 27.9 g/dL — ABNORMAL LOW (ref 30.0–36.0)
MCV: 108.6 fL — AB (ref 78.0–100.0)
PLATELETS: 305 10*3/uL (ref 150–400)
RBC: 2.67 MIL/uL — AB (ref 3.87–5.11)
RDW: 21.6 % — ABNORMAL HIGH (ref 11.5–15.5)
WBC: 8.1 10*3/uL (ref 4.0–10.5)

## 2018-04-08 LAB — BASIC METABOLIC PANEL
ANION GAP: 8 (ref 5–15)
BUN: 14 mg/dL (ref 6–20)
CHLORIDE: 129 mmol/L — AB (ref 98–111)
CO2: 22 mmol/L (ref 22–32)
Calcium: 8.6 mg/dL — ABNORMAL LOW (ref 8.9–10.3)
Creatinine, Ser: 0.88 mg/dL (ref 0.44–1.00)
GFR calc non Af Amer: >60 mL/min (ref >60)
Glucose, Bld: 175 mg/dL — ABNORMAL HIGH (ref 70–99)
Potassium: 2.8 mmol/L — ABNORMAL LOW (ref 3.5–5.1)
Sodium: 159 mmol/L — ABNORMAL HIGH (ref 135–145)

## 2018-04-08 LAB — GLUCOSE, CAPILLARY
GLUCOSE-CAPILLARY: 141 mg/dL — AB (ref 70–99)
GLUCOSE-CAPILLARY: 121 mg/dL — AB (ref 70–99)
Glucose-Capillary: 110 mg/dL — ABNORMAL HIGH (ref 70–99)

## 2018-04-08 LAB — MAGNESIUM: Magnesium: 2.3 mg/dL (ref 1.7–2.4)

## 2018-04-08 MED ORDER — DEXTROSE 5 % IV SOLN
INTRAVENOUS | Status: DC
  Administered 2018-04-08 – 2018-04-09 (×2): via INTRAVENOUS

## 2018-04-08 MED ORDER — POTASSIUM CHLORIDE 10 MEQ/100ML IV SOLN
10.0000 meq | INTRAVENOUS | Status: AC
  Administered 2018-04-08 (×4): 10 meq via INTRAVENOUS
  Filled 2018-04-08 (×4): qty 100

## 2018-04-08 MED ORDER — HALOPERIDOL LACTATE 5 MG/ML IJ SOLN: 2.0000 mg | Freq: Once | INTRAMUSCULAR | Status: DC

## 2018-04-08 MED ORDER — IOPAMIDOL (ISOVUE-370) INJECTION 76%
50.0000 mL | Freq: Once | INTRAVENOUS | Status: AC | PRN
  Administered 2018-04-08: 50 mL via INTRAVENOUS

## 2018-04-08 NOTE — Procedures (Signed)
ELECTROENCEPHALOGRAM REPORT   Patient: Diane Short       Room #: 1B16O EEG No. ID: [id number] Age: 62 y.o.        Sex: female Referring Physician: Quincy Simmonds Report Date:  04/08/2018        Interpreting Physician: Alexis Goodell  History: Diane Short is an 62 y.o. female with SAH and altered mental status.  Right anterior temporal lobe encephalomalacia  Medications:  Norvasc, Coreg, Catapres, Pepcid, Insulin, Normodyne, Keppra, Nimotop  Conditions of Recording:  This is a 21 channel routine scalp EEG performed with bipolar and monopolar montages arranged in accordance to the international 10/20 system of electrode placement. One channel was dedicated to EKG recording.  The patient is in the altered state.  Description:  The waking background activity is asymmetric.  Over the left hemisphere is noted a posterior background that consists of a low voltage, fairly well organized, 8 Hz alpha activity, seen from the parieto-occipital and posterior temporal region.  Low voltage fast activity, poorly organized, is seen anteriorly and is at times superimposed on more posterior regions.  A mixture of theta and alpha rhythms are seen from the central and temporal region. This background activity is slower over the right hemisphere with some underlying polymorphic delta activity seen.  Also noted over the right hemisphere is frequent sharp activity with phase reversal at T4.  Although this sharp activity often occurs in clusters there is no change in clinical activity noted.    Hyperventilation and intermittent photic stimulation were not performed.   IMPRESSION: This is an abnormal electroencephalogram secondary to right temporal slowing and sharp activity with phase reversal at T4.  This is consistent with the patient's history of right temporal encephalomalacia and suggests epileptogenic potential.   Alexis Goodell, MD Neurology [phone] 04/08/2018, 6:00 PM

## 2018-04-08 NOTE — Consult Note (Signed)
Consultation Note Date: 04/08/2018   Patient Name: Diane Short  DOB: 1956-05-22  MRN: [id number]  Age / Sex: 62 y.o., female  PCP: Burnard Bunting, MD Referring Physician: Patrecia Pour, Christean Grief, MD  Reason for Consultation: Establishing goals of care  HPI/Patient Profile: 62 y.o. 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 She required intubation for airway protection. CT scan revealed SAH. 9/10 she had a cerebral angiogram with coiling. She was successfully extubated 9/21. She failed swallow study 9/22 , but after MBS on 9/23 a dys 1 diet was recommended. 9/25 and 9/26 increasingly lethargic and increasing frequency of tremors. To have repeat CT and EEG 9/26. PT recommended CIR; however, patient was deemed inappropriate as she is unable to participate in intensive therapy. PMT consulted by MD because PEG tube was recommended by physical rehab MD. PMT to assist with goals of care.  Clinical Assessment and Goals of Care: I have reviewed medical records including EPIC notes, labs and imaging, assessed the patient and then met with patient's spouse, [name], daughter, Angus Palms, and 2 family friends to discuss diagnosis prognosis, GOC, EOL wishes, disposition and options.  I introduced Palliative Medicine as specialized medical care for people living with serious illness. It focuses on providing relief from the symptoms and stress of a serious illness. The goal is to improve quality of life for both the patient and the family.  We discussed a brief life review of the patient. Family tells me about how active she is and involved with friends. She worked as an  Medical illustrator.  As far as functional and nutritional status, prior to hospital admission she was weak from chemotherapy but able to complete all ADLs independently, driving to go out with friends, and eating well. Her current functional and nutritional status is far from her pre admission baseline.    We discussed her current illness and what it means in the larger context of her on-going co-morbidities. We discussed her SAH in the setting of stage 4 breast cancer. Also discussed the complications including dysphagia, severe weakness, lack of desire to eat, and lethargy.  I attempted to elicit values and goals of care important to the patient.   Her husband shares that the patient has a living will and would not want life artificially prolonged if there were "no chance" of meaningful recovery.  We specifically discussed feeding tube. At first family is interested in this option. After further discussion they are hopeful to continue with careful hand feeding of patient in attempt to maintain adequate nutrition. We discussed the bigger picture - why does she not have the desire to eat and continued lethargy and discussed if a feeding tube would help the "big picture". Discussed pros and cons of feeding tubes. At this time, family is hopeful to avoid feeding tube.  Family is eager to see results of repeat CT and EEG and hopeful for explanation of patient's AMS. We discussed continued evaluation of patient and evaluating decisions daily. Discussed that if patient continue to decline in the coming days and mental status does not improve that we may be at a place where it makes more sense to focus on her comfort and not proceed with aggressive interventions such as feeding tubes. Family agrees with  this and agree they would not want life prolonged if she did not have good quality of life. Family also expresses hope that patient improves enough to be able to participate in rehab.   Advance directives,  concepts specific to code status, artifical feeding and hydration, and rehospitalization were considered and discussed. At this time, patient to remain a full code.  Questions and concerns were addressed. The family was encouraged to call with questions or concerns.   Primary Decision Maker NEXT OF KIN - husband - Keshayla Schrum  SUMMARY OF RECOMMENDATIONS   - full scope treatment/full code - family hopeful to hold off on feeding tube and proceed with careful hand feeding for now - had initial discussion about patient's illness and concern about long-term outcomes given patient's stage 4 cancer and now [facility] with AMS, debility, and dysphagia - family understands and expresses gratitude for discussion - PMT will continue to meet with family regularly to discuss goals of care and evaluate clinical course  Code Status/Advance Care Planning:  Full code   Symptom Management:   Per primary  Palliative Prophylaxis:   Aspiration, Bowel Regimen, Delirium Protocol, Frequent Pain Assessment, Oral Care and Turn Reposition  Additional Recommendations (Limitations, Scope, Preferences):  Full Scope Treatment  Prognosis:   Unable to determine  Discharge Planning: To Be Determined      Primary Diagnoses: Present on Admission: **None**   I have reviewed the medical record, interviewed the patient and family, and examined the patient. The following aspects are pertinent.  Past Medical History:  Diagnosis Date  . 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 Anxiety   . Arthritis   . Cancer [facility]    breast cancer  . Cerebral hemorrhage ([place]) 1986  . Depression    due to cancer dx  . Diverticulosis   . GERD (gastroesophageal reflux disease)   . Headache   . History of hiatal hernia   . Hypertension   . 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 Neutropenic fever ([place])    Chemo related  . Osteonecrosis ([place]) 2017-2018   Right Jaw bone  . Thrush, oral    Chemo related   Social History   Socioeconomic  History  . Marital status: Married    Spouse name: Not on file  . Number of children: Not on file  . Years of education: Not on file  . Highest education level: Not on file  Occupational History  . Not on file  Social Needs  . Financial resource strain: Not on file  . Food insecurity:    Worry: Not on file    Inability: Not on file  . Transportation needs:    Medical: Not on file    Non-medical: Not on file  Tobacco Use  . Smoking status: Current Every Day Smoker    Packs/day: 0.50    Years: 42.00    Pack years: 21.00    Types: Cigarettes  . Smokeless tobacco: Never Used  Substance and Sexual Activity  . Alcohol use: No  . Drug use: No  . Sexual activity: Not on file  Lifestyle  . Physical activity:    Days per week: Not on file    Minutes per session: Not on file  . Stress: Not on file  Relationships  . Social connections:    Talks on phone: Not on file    Gets together: Not on file    Attends religious service: Not on file    Active  member of club or organization: Not on file    Attends meetings of clubs or organizations: Not on file    Relationship status: Not on file  Other Topics Concern  . Not on file  Social History Narrative  . Not on file   Family History  Problem Relation Age of Onset  . Alzheimer's disease Mother   . Colon cancer Father   . Cerebral aneurysm Father   . Diabetes Sister   . Stomach cancer Brother   . Esophageal cancer Brother   . Multiple myeloma Brother   . Hypertension Sister    Scheduled Meds: . amLODipine  10 mg Oral Daily  . carvedilol  25 mg Oral BID WC  . chlorhexidine gluconate (MEDLINE KIT)  15 mL Mouth Rinse BID  . Chlorhexidine Gluconate Cloth  6 each Topical Daily  . cloNIDine  0.2 mg Transdermal Weekly  . enoxaparin (LOVENOX) injection  30 mg Subcutaneous Q24H  . famotidine  20 mg Oral Daily  . insulin aspart  0-15 Units Subcutaneous TID WC  . insulin aspart  0-5 Units Subcutaneous QHS  . labetalol  100 mg Oral  TID  . levETIRAcetam  1,000 mg Oral BID  . niMODipine  60 mg Oral Q4H   Or  . niMODipine  60 mg Per Tube Q4H  . sodium chloride flush  10-40 mL Intracatheter Q12H   Continuous Infusions: . dextrose 42 mL/hr at 04/08/18 1226  . potassium chloride 10 mEq (04/08/18 1324)   PRN Meds:.acetaminophen **OR** acetaminophen (TYLENOL) oral liquid 160 mg/5 mL **OR** acetaminophen, docusate sodium, hydrALAZINE, ipratropium-albuterol, loperamide, LORazepam, metoprolol tartrate, ondansetron **OR** ondansetron (ZOFRAN) IV, RESOURCE THICKENUP CLEAR Allergies  Allergen Reactions  . Nsaids Other (See Comments)    GI upset   . Ace Inhibitors Other (See Comments)    cough   Review of Systems  Unable to perform ROS: Mental status change    Physical Exam  Constitutional: She appears well-developed and well-nourished. She appears lethargic. No distress.  HENT:  Head: Normocephalic and atraumatic.  Cardiovascular: Normal rate and regular rhythm.  Pulmonary/Chest: Effort normal and breath sounds normal. No respiratory distress.  Abdominal: Soft.  Neurological: She appears lethargic. She is disoriented.  Skin: Skin is warm and dry. She is not diaphoretic.  Psychiatric: Cognition and memory are impaired.    Vital Signs: BP (!) 176/71   Pulse 86   Temp 98.4 F (36.9 C) (Oral)   Resp 14   Ht '5\' 4"'  (1.626 m)   Wt 102 kg   LMP 08/14/2010 (Exact Date)   SpO2 99%   BMI 38.60 kg/m  Pain Scale: CPOT   Pain Score: Asleep   SpO2: SpO2: 99 % O2 Device:SpO2: 99 % O2 Flow Rate: .O2 Flow Rate (L/min): 1 L/min  IO: Intake/output summary:   Intake/Output Summary (Last 24 hours) at 04/08/2018 1415 Last data filed at 04/08/2018 1400 Gross per 24 hour  Intake 165.54 ml  Output 779 ml  Net -613.46 ml    LBM: Last BM Date: 04/08/18(rectal pouch with current output) Baseline Weight: Weight: 90.7 kg Most recent weight: Weight: 102 kg     Palliative Assessment/Data: PPS 20%     Time Total: 95  minutes Greater than 50%  of this time was spent counseling and coordinating care related to the above assessment and plan.  Juel Burrow, DNP, AGNP-C Palliative Medicine Team [phone] Pager: [phone]

## 2018-04-08 NOTE — Progress Notes (Addendum)
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  Patient intubated in the ED for airway protection, subsequently started on nimodipine.  Patient was admitted to the ICU, neurosurgery was consulted, status post coiling and embolization of PICA aneurysm.  Patient successfully extubated on 9/21.  Working with PT recommending CIR however not candidate.  Palliative care consulted for goals of care.  Significant events 9/10 cerebral angiogram with coiling 9/11 extubated >> reintubated due to stridor and start steroids; start ABx for UTI, aspiration 9/13 wheezing, respiratory acidosis >> paralytic x 1, deep sedation. 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 On fent, versed and diprivan. On Cardene  For goal sbp < 160.  On tube feeds. Lot of 3rd spacing.  +. Rt hand bruising - worse per RN but pulse rt radial, rt ulnar and cap refill on right hand. Started lasix 9/16 remained paralyzed, diuresed, attempt to get paralytics off  9/17 diuresed very well overnight but Na increased, tolerated nimbex off, minimizing sedation and holding diureses for today, decrease PEEP and FiO2 to 5 and 30%. 9/20 diuresed well and beginning active weaning 9/21 Net negative 8 L, weaning well on 5/5>> Extubated 9/22 failed SLP, off precedex  9/23 MBS-> dys 1 diet; urinary retention  Subjective: Patient seen and examined, she is more lethargic today, having more frequent tremors. Family members at bedside, report that she was agitated last nigh received 0.5 mg of ativan. However this morning she was more alert and able to eat.  Assessment & Plan: Acute respiratory failure with  hypoxia, hypercapnia - resolved  Postextubation stridor 9/11; extubated 9/21 Aspiration PNA Successfully weaning off oxygen, SLP mild aspiration risk.  She was treated with Unasyn for aspiration pneumonia.  Yesterday had isolated febrile episode resolved without interventions.  Continue supportive treatment.  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 POD #16 Nimotop and TCD's per NSGY Continue neurochecks and seizure precautions. Will check CTA of the head, given new lethargy Continue Keppra for seizure prophylaxis, however given lethargy this morning will obtain Keppra levels.   Patient with right side tremors and leg jerking, wondering if this could be seizure leading to lethargy.  Will obtain EEG Keep systolic blood pressure below 180 Continuing Norvasc 10 mg PO, coreg 60m BID, labetalol 10104mTID, catapress 0.60m71mpatch apresoline / lopressor PRN  Acute metabolic encephalopathy Recurrent altered mental status, questionable seizures versus further bleeding versus sedation from medication.  Obtaining Keppra levels, EEG and CTA of the head.  Avoid Ativan Labs show hypernatremia, ?  Contributing.  Will add gentle D5W for 1 day and check levels.   Aspiration pneumonia vs bilateral atelectasis  Patient completed course of antibiotic with Unasyn.  Had an isolated fever episode on 9/25 resolving spontaneously.  Continue to monitor off antibiotics for now.  Continue supportive treatment, if spike fevers obtain blood cultures.  UA from 9/25 normal.  Hypokalemia Replete Check mag and BMP in a.m.  E coli UTI Completed course of Rocephin on 9/17  Dysphagia SLP recommending dysphagia 1 diet Nutrition consult  Deconditioning PT was consulted, recommending CIR.  CAR evaluated patient and deemed patient not candidate for intensive rehab, as patient has multiple  comorbidities and not strong enough to participate in intensive therapy.  Poor nutrition will not helping with  recovery.  Acute urinary retention Foley was reinserted 9/24 due to urinary retention.  Patient with rectal pouch at risk for urinary tract infection.  Stage 4 breast cancer s/p mastectomy, XRT to brain met, and recently on chemo Chemo induced anemia and thrombocytopenia.  Outpatient oncology follow-up.  Monitor CBC  Goals of care Physical rehabilitation MD mentioned to patient that she may be candidate for PEG tube feeding in order to improve nutrition.  I have discussed with patient and husband regarding goals of care.  I do not advise PEG tube feeding in this patient with multiple comorbidities.  Awaiting palliative care consult.  DVT prophylaxis: SCDs Code Status: Full code Family Communication: Husband at bedside Disposition Plan: Transferred to stepdown  Consultants:   Neurosurgery  PCCM  PMR  Palliative care  Antimicrobials: Anti-infectives (From admission, onward)   Start     Dose/Rate Route Frequency Ordered Stop   04/04/18 1200  Ampicillin-Sulbactam (UNASYN) 3 g in sodium chloride 0.9 % 100 mL IVPB     3 g 200 mL/hr over 30 Minutes Intravenous Every 6 hours 04/04/18 1110 04/06/18 1750   03/26/18 0830  cefTRIAXone (ROCEPHIN) 1 g in sodium chloride 0.9 % 100 mL IVPB  Status:  Discontinued     1 g 200 mL/hr over 30 Minutes Intravenous Every 24 hours 03/26/18 0825 03/30/18 1003   03/24/18 1300  piperacillin-tazobactam (ZOSYN) IVPB 3.375 g  Status:  Discontinued     3.375 g 12.5 mL/hr over 240 Minutes Intravenous Every 8 hours 03/24/18 1202 03/26/18 0825   03/24/18 1030  cefTRIAXone (ROCEPHIN) 1 g in sodium chloride 0.9 % 100 mL IVPB  Status:  Discontinued     1 g 200 mL/hr over 30 Minutes Intravenous Every 24 hours 03/24/18 1018 03/24/18 1143        Objective: Vitals:   04/08/18 0800 04/08/18 0900 04/08/18 1000 04/08/18 1100  BP: (!) 169/76 (!) 186/79 136/63 (!) 160/65  Pulse: 77 83 78 79  Resp:      Temp: 98.8 F (37.1 C)     TempSrc: Oral     SpO2: 99%  99% 99% 100%  Weight:      Height:        Intake/Output Summary (Last 24 hours) at 04/08/2018 1142 Last data filed at 04/08/2018 0600 Gross per 24 hour  Intake 100 ml  Output 979 ml  Net -879 ml   Filed Weights   03/22/18 0819 03/23/18 0404  Weight: 90.7 kg 102 kg    Examination:  General exam: Lethargic, tremors  Respiratory system: Decreased breath sounds bilaterally, no wheezing or crackles Cardiovascular system: S1-S2, RRR, no murmurs or rubs Gastrointestinal system: Abdomen soft, nontender nondistended Central nervous system: Lethargic, wake up to verbal stimuli follow simple command, jerking movement on her right leg and right upper extremity. Extremities: No LE edema Skin: No rash Psychiatry: Unable to evaluate  Data Reviewed: I have personally reviewed following labs and imaging studies  CBC: Recent Labs  Lab 04/02/18 0500 04/03/18 0510 04/04/18 0506 04/05/18 0525 04/06/18 0508  WBC 8.7 11.0* 12.4* 10.1 7.2  HGB 7.6* 8.2* 8.3* 8.2* 8.2*  HCT 25.4* 26.0* 26.7* 26.9* 27.7*  MCV 102.8* 97.4 98.2 99.3 103.0*  PLT 172 186 224 266 277   Basic Metabolic Panel: Recent Labs  Lab 04/02/18 0500  04/03/18 0510  04/04/18 0506 04/04/18 2148 04/05/18 0525 04/06/18 4128 04/06/18 0513 04/07/18  0448  NA 146*   < > 136   < > 139 138 143  --  150* 155*  K 3.1*   < > 2.8*   < > 2.4* 2.2* 2.8*  --  2.9* 3.0*  CL 102   < > 85*   < > 92* 101 102  --  114* 121*  CO2 30   < > 34*   < > _0 --  24 22  GLUCOSE 225*   < > 212*   < > 112* 140* 120*  --  156* 129*  BUN 33*   < > 35*   < > 32* 22* 19  --  14 14  CREATININE 0.84   < > 0.94   < > 0.93 0.87 0.88  --  0.79 0.76  CALCIUM 8.9   < > 9.1   < > 9.1 8.9 9.0  --  8.8* 8.7*  MG 1.6*  --  2.0  --   --   --   --  2.2  --  2.2  PHOS 3.6  --  5.4*  --   --   --   --   --  3.1 3.1   < > = values in this interval not displayed.   GFR: Estimated Creatinine Clearance: 86.9 mL/min (by C-G formula based on SCr of 0.76  mg/dL). Liver Function Tests: Recent Labs  Lab 04/03/18 0510 04/06/18 0513 04/07/18 0448  AST 29  --   --   ALT 40  --   --   ALKPHOS 85  --   --   BILITOT 0.7  --   --   PROT 6.2*  --   --   ALBUMIN 2.6* 2.7* 2.7*   No results for input(s): LIPASE, AMYLASE in the last 168 hours. No results for input(s): AMMONIA in the last 168 hours. Coagulation Profile: No results for input(s): INR, PROTIME in the last 168 hours. Cardiac Enzymes: No results for input(s): CKTOTAL, CKMB, CKMBINDEX, TROPONINI in the last 168 hours. BNP (last 3 results) No results for input(s): PROBNP in the last 8760 hours. HbA1C: No results for input(s): HGBA1C in the last 72 hours. CBG: Recent Labs  Lab 04/07/18 1206 04/07/18 1657 04/07/18 2145 04/08/18 0817 04/08/18 1116  GLUCAP 134* 139* 116* 110* 141*   Lipid Profile: No results for input(s): CHOL, HDL, LDLCALC, TRIG, CHOLHDL, LDLDIRECT in the last 72 hours. Thyroid Function Tests: No results for input(s): TSH, T4TOTAL, FREET4, T3FREE, THYROIDAB in the last 72 hours. Anemia Panel: No results for input(s): VITAMINB12, FOLATE, FERRITIN, TIBC, IRON, RETICCTPCT in the last 72 hours. Sepsis Labs: Recent Labs  Lab 04/02/18 1026 04/03/18 0510 04/04/18 0506  PROCALCITON 0.26 0.36 0.44    Recent Results (from the past 240 hour(s))  Culture, blood (routine x 2)     Status: None   Collection Time: 04/02/18 10:26 AM  Result Value Ref Range Status   Specimen Description BLOOD FOOT RIGHT  Final   Special Requests   Final    BOTTLES DRAWN AEROBIC ONLY Blood Culture adequate volume   Culture   Final    NO GROWTH 5 DAYS Performed at [facility], [address] [place], [place] [address]    Report Status 04/07/2018 FINAL  Final  Culture, blood (routine x 2)     Status: None   Collection Time: 04/02/18 10:32 AM  Result Value Ref Range Status   Specimen Description BLOOD FOOT LEFT  Final  Special Requests   Final    BOTTLES DRAWN AEROBIC  ONLY Blood Culture adequate volume   Culture   Final    NO GROWTH 5 DAYS Performed at [facility], [place] [address], [place], [place] [address]    Report Status 04/07/2018 FINAL  Final  Culture, respiratory (non-expectorated)     Status: None   Collection Time: 04/02/18 11:18 AM  Result Value Ref Range Status   Specimen Description TRACHEAL ASPIRATE  Final   Special Requests NONE  Final   Gram Stain   Final    MODERATE WBC PRESENT, PREDOMINANTLY PMN MODERATE GRAM POSITIVE COCCI MODERATE GRAM POSITIVE RODS    Culture   Final    Consistent with normal respiratory flora. Performed at [facility], [place] [address], [place], [place] [address]    Report Status 04/04/2018 FINAL  Final  Culture, Urine     Status: None   Collection Time: 04/02/18 12:48 PM  Result Value Ref Range Status   Specimen Description URINE, CATHETERIZED  Final   Special Requests NONE  Final   Culture   Final    NO GROWTH Performed at [facility], [place] [address], [place], [place] [address]    Report Status 04/03/2018 FINAL  Final     Radiology Studies: No results found.  Scheduled Meds: . amLODipine  10 mg Oral Daily  . carvedilol  25 mg Oral BID WC  . chlorhexidine gluconate (MEDLINE KIT)  15 mL Mouth Rinse BID  . Chlorhexidine Gluconate Cloth  6 each Topical Daily  . cloNIDine  0.2 mg Transdermal Weekly  . enoxaparin (LOVENOX) injection  30 mg Subcutaneous Q24H  . famotidine  20 mg Oral Daily  . insulin aspart  0-15 Units Subcutaneous TID WC  . insulin aspart  0-5 Units Subcutaneous QHS  . labetalol  100 mg Oral TID  . levETIRAcetam  1,000 mg Oral BID  . niMODipine  60 mg Oral Q4H   Or  . niMODipine  60 mg Per Tube Q4H  . sodium chloride flush  10-40 mL Intracatheter Q12H   Continuous Infusions:   LOS: 17 days   Time spent: Total of 35 minutes spent with pt, greater than 50% of which was spent in discussion of  treatment, counseling and coordination of care.  Chipper Oman,  MD Pager: Text Page via [email or web address]   If 7PM-7AM, please contact night-coverage [email or web address] 04/08/2018, 11:42 AM   Note - This record has been created using [employer]. Chart creation errors have been sought, but may not always have been located. Such creation errors do not reflect on the standard of medical care.

## 2018-04-08 NOTE — Progress Notes (Signed)
Occupational Therapy Treatment Patient Details Name: Diane Short MRN: [id number] DOB: 1955/12/18 Today's Date: 04/08/2018    History of present illness 62 yo female hx smoking, stage 4 breast cancer with brain met tx with chemo and brain XRT, cerebral aneurysms s/p intervention presented with persistent headache, and altered mental status.  Found to have stable intraventricular hemorrhage as well as subarachnoid hemorrhage concentrated in left prepontine cistern and left CP angle cistern.  Intubated 9/9-9/11 and re-intubated 9/11-9/21. CXR 9/22 mild patchy/platelike lower lobe opacities, likely atelectasis.   OT comments  Seen as co-treaet with PT. Able to transfer to recliner with use of Stedy and Max A +2. Max to total A with all ADL. More alert once OOB in chair. VSS during session. Recommend SNF for rehab. Will continue to follow acutely.   Follow Up Recommendations  SNF;Supervision/Assistance - 24 hour    Equipment Recommendations  3 in 1 bedside commode;Tub/shower bench;Hospital bed    Recommendations for Other Services      Precautions / Restrictions Precautions Precautions: Fall       Mobility Bed Mobility Overal bed mobility: Needs Assistance Bed Mobility: Sidelying to Sit   Sidelying to sit: Max assist          Transfers Overall transfer level: Needs assistance   Transfers: Sit to/from Stand Sit to Stand: +2 physical assistance;Max assist              Balance Overall balance assessment: Needs assistance Sitting-balance support: Feet supported Sitting balance-Leahy Scale: Poor Sitting balance - Comments: posterior bias     Standing balance-Leahy Scale: Poor Standing balance comment: unable to achieve full upright posture in Stedy                           ADL either performed or assessed with clinical judgement   ADL Overall ADL's : Needs assistance/impaired Eating/Feeding: Maximal assistance   Grooming: Maximal assistance    Upper Body Bathing: Maximal assistance;Bed level                           Functional mobility during ADLs: Maximal assistance;+2 for physical assistance(with use of Stedy)       Vision   Vision Assessment?: Vision impaired- to be further tested in functional context   Perception     Praxis      Cognition Arousal/Alertness: Awake/alert Behavior During Therapy: Flat affect Overall Cognitive Status: Impaired/Different from baseline Area of Impairment: Orientation;Attention;Memory;Following commands;Safety/judgement;Awareness;Problem solving                   Current Attention Level: Sustained Memory: Decreased recall of precautions;Decreased short-term memory Following Commands: Follows one step commands with increased time Safety/Judgement: Decreased awareness of safety;Decreased awareness of deficits Awareness: Intellectual Problem Solving: Slow processing;Decreased initiation;Difficulty sequencing;Requires verbal cues;Requires tactile cues          Exercises Exercises: Other exercises Other Exercises Other Exercises: Educated daughter on B hand edema control and ROM   Shoulder Instructions       General Comments      Pertinent Vitals/ Pain       Pain Assessment: Faces Faces Pain Scale: Hurts a little bit Pain Location: general discomfort Pain Descriptors / Indicators: Guarding Pain Intervention(s): Limited activity within patient's tolerance  Home Living  Prior Functioning/Environment              Frequency  Min 2X/week        Progress Toward Goals  OT Goals(current goals can now be found in the care plan section)  Progress towards OT goals: Progressing toward goals  Acute Rehab OT Goals Patient Stated Goal: to get stronger OT Goal Formulation: With patient/family Time For Goal Achievement: 04/20/18 Potential to Achieve Goals: Good ADL Goals Pt Will Perform  Eating: with set-up;with supervision;sitting Pt Will Perform Grooming: with min assist;sitting Pt Will Perform Upper Body Bathing: with min assist;sitting Pt Will Perform Lower Body Bathing: with mod assist;sit to/from stand Pt Will Transfer to Toilet: bedside commode;with mod assist;stand pivot transfer  Plan Discharge plan needs to be updated    Co-evaluation    PT/OT/SLP Co-Evaluation/Treatment: Yes Reason for Co-Treatment: Complexity of the patient's impairments (multi-system involvement);For patient/therapist safety;Necessary to address cognition/behavior during functional activity   OT goals addressed during session: ADL's and self-care;Strengthening/ROM      AM-PAC PT "6 Clicks" Daily Activity     Outcome Measure   Help from another person eating meals?: A Lot Help from another person taking care of personal grooming?: A Lot Help from another person toileting, which includes using toliet, bedpan, or urinal?: Total Help from another person bathing (including washing, rinsing, drying)?: A Lot Help from another person to put on and taking off regular upper body clothing?: A Lot Help from another person to put on and taking off regular lower body clothing?: Total 6 Click Score: 10    End of Session Equipment Utilized During Treatment: Gait belt  OT Visit Diagnosis: Other abnormalities of gait and mobility (R26.89);Muscle weakness (generalized) (M62.81);Apraxia (R48.2);Other symptoms and signs involving cognitive function   Activity Tolerance Patient tolerated treatment well   Patient Left in chair;with call bell/phone within reach;with nursing/sitter in room   Nurse Communication Mobility status;Need for lift equipment(maxisky)        Time: 1538-1610 OT Time Calculation (min): 32 min  Charges: OT General Charges $OT Visit: 1 Visit OT Treatments $Self Care/Home Management : 8-22 mins  Maurie Boettcher, OT/L   Acute OT Clinical Specialist [place] Pager [phone] Office [phone]    [facility] 04/08/2018, 5:08 PM

## 2018-04-08 NOTE — Progress Notes (Signed)
Nutrition Follow-up  DOCUMENTATION CODES:   Obesity unspecified  INTERVENTION:   Continue:  Magic cup TID with meals, each supplement provides 290 kcal and 9 grams of protein  If family desires enteral nutrition support recommend Jevity 1.2 @ 60 ml/hr 30 ml Prostat daily Provides: 1828 kcal, 95 grams protein, and 1167 ml free water.    NUTRITION DIAGNOSIS:   Inadequate oral intake related to dysphagia as evidenced by meal completion < 50%. Ongoing.   GOAL:   Patient will meet greater than or equal to 90% of their needs Progressing.   MONITOR:   PO intake, Diet advancement, Supplement acceptance  ASSESSMENT:   62 yo female with PMH of HTN, GERD, hiatal hernia, breast cancer with brain mets (receiving chemo & XRT), and osteonecrosis of R jaw bone who was admitted on 9/9 with SAH.  9/21 extubated 9/23 pt started on dysphagia 1 with honey thickened liquids  Per MD notes rehab has recommended PEG, however this may contradict [place]. Palliative care consult pending.  Per MD pt more lethargic today, pt currently down for stat CT.   Meal Completion: <10%  Medications reviewed and include: KCl and SSI Labs reviewed: Na 159 (H), K+ 2.8 (L)   Diet Order:   Diet Order            DIET - DYS 1 Room service appropriate? Yes; Fluid consistency: Honey Thick  Diet effective now              EDUCATION NEEDS:   No education needs have been identified at this time  Skin:  Skin Assessment: Reviewed RN Assessment  Last BM:  200 ml x 24 hr via rectal pouch  Height:   Ht Readings from Last 1 Encounters:  03/24/18 5\' 4"  (1.626 m)    Weight:   Wt Readings from Last 1 Encounters:  03/23/18 102 kg    Ideal Body Weight:  54.5 kg  BMI:  Body mass index is 38.6 kg/m.  Estimated Nutritional Needs:   Kcal:  1800-2000  Protein:  90-110 grams  Fluid:  > 1.8 L/day  Maylon Peppers RD, LDN, CNSC [phone] Pager [phone] After Hours Pager

## 2018-04-08 NOTE — Progress Notes (Signed)
Received call from Dr Patrecia Pour regarding patient. Appears to be more lethargic today and having right sided tremors/"jerking". Rec emergent CTA to r/o large vessel vasospasm. Given right sided tremors/jerking (which are clinically observed and persistent), rec EEG as she could be having seizures/SE causing lethargy.

## 2018-04-08 NOTE — Progress Notes (Signed)
Physical Therapy Treatment Patient Details Name: Diane Short MRN: [id number] DOB: [birth year] Today's Date: 04/08/2018    History of Present Illness 62 yo female hx smoking, stage 4 breast cancer with brain met tx with chemo and brain XRT, cerebral aneurysms s/p intervention presented with persistent headache, and altered mental status.  Found to have stable intraventricular hemorrhage as well as subarachnoid hemorrhage concentrated in left prepontine cistern and left CP angle cistern.  Intubated 9/9-9/11 and re-intubated 9/11-9/21. CXR 9/22 mild patchy/platelike lower lobe opacities, likely atelectasis.    PT Comments    Pt very quick to fatigue, but participative.  Very tremulous in the STEDY.  Emphasis on sitting balance and sit to stand in the STEDY.   Follow Up Recommendations  Supervision/Assistance - 24 hour;SNF     Equipment Recommendations  Other (comment)(TBA)    Recommendations for Other Services Rehab consult     Precautions / Restrictions Precautions Precautions: Fall    Mobility  Bed Mobility Overal bed mobility: Needs Assistance Bed Mobility: Sidelying to Sit   Sidelying to sit: Max assist          Transfers Overall transfer level: Needs assistance   Transfers: Sit to/from Stand Sit to Stand: +2 physical assistance;Max assist            Ambulation/Gait             General Gait Details: not able   Stairs             Wheelchair Mobility    Modified Rankin (Stroke Patients Only) Modified Rankin (Stroke Patients Only) Pre-Morbid Rankin Score: No symptoms Modified Rankin: Severe disability     Balance Overall balance assessment: Needs assistance Sitting-balance support: Feet supported Sitting balance-Leahy Scale: Poor Sitting balance - Comments: posterior bias     Standing balance-Leahy Scale: Poor Standing balance comment: unable to achieve full upright posture in Stedy                             Cognition Arousal/Alertness: Awake/alert Behavior During Therapy: Flat affect Overall Cognitive Status: Impaired/Different from baseline Area of Impairment: Orientation;Attention;Memory;Following commands;Safety/judgement;Awareness;Problem solving                   Current Attention Level: Sustained Memory: Decreased recall of precautions;Decreased short-term memory Following Commands: Follows one step commands with increased time Safety/Judgement: Decreased awareness of safety;Decreased awareness of deficits Awareness: Intellectual Problem Solving: Slow processing;Decreased initiation;Difficulty sequencing;Requires verbal cues;Requires tactile cues        Exercises Other Exercises Other Exercises: Educated daughter on B hand edema control and ROM Other Exercises: basic education on AAROM to LE's    General Comments        Pertinent Vitals/Pain Pain Assessment: Faces Faces Pain Scale: Hurts a little bit Pain Location: general discomfort Pain Descriptors / Indicators: Guarding Pain Intervention(s): Limited activity within patient's tolerance    Home Living                      Prior Function            PT Goals (current goals can now be found in the care plan section) Acute Rehab PT Goals Patient Stated Goal: to get stronger PT Goal Formulation: With patient Time For Goal Achievement: 04/20/18 Potential to Achieve Goals: Fair Progress towards PT goals: Progressing toward goals    Frequency    Min 3X/week      PT Plan Current  plan remains appropriate    Co-evaluation PT/OT/SLP Co-Evaluation/Treatment: Yes Reason for Co-Treatment: Complexity of the patient's impairments (multi-system involvement);For patient/therapist safety;Necessary to address cognition/behavior during functional activity PT goals addressed during session: Mobility/safety with mobility OT goals addressed during session: ADL's and self-care;Strengthening/ROM      AM-PAC  PT "6 Clicks" Daily Activity  Outcome Measure  Difficulty turning over in bed (including adjusting bedclothes, sheets and blankets)?: Unable Difficulty moving from lying on back to sitting on the side of the bed? : Unable Difficulty sitting down on and standing up from a chair with arms (e.g., wheelchair, bedside commode, etc,.)?: Unable Help needed moving to and from a bed to chair (including a wheelchair)?: Total Help needed walking in hospital room?: Total Help needed climbing 3-5 steps with a railing? : Total 6 Click Score: 6    End of Session Equipment Utilized During Treatment: Gait belt Activity Tolerance: Patient limited by fatigue Patient left: in chair;with call bell/phone within reach;with family/visitor present;Other (comment)(on Sky lift pad)   PT Visit Diagnosis: Hemiplegia and hemiparesis;Other abnormalities of gait and mobility (R26.89);Muscle weakness (generalized) (M62.81) Hemiplegia - Right/Left: Right Hemiplegia - caused by: Nontraumatic intracerebral hemorrhage     Time: 1538-1610 PT Time Calculation (min) (ACUTE ONLY): 32 min  Charges:  $Therapeutic Activity: 8-22 mins                     04/08/2018  Donnella Sham, PT Acute Rehabilitation Services [phone]  (pager) [phone]  (office)   Tessie Fass Nasiya Pascual 04/08/2018, 6:39 PM

## 2018-04-09 ENCOUNTER — Inpatient Hospital Stay (HOSPITAL_COMMUNITY): Payer: Medicaid Other

## 2018-04-09 DIAGNOSIS — R569 Unspecified convulsions: Secondary | ICD-10-CM

## 2018-04-09 DIAGNOSIS — G92 Toxic encephalopathy: Secondary | ICD-10-CM

## 2018-04-09 LAB — BASIC METABOLIC PANEL
ANION GAP: 8 (ref 5–15)
BUN: 13 mg/dL (ref 6–20)
CHLORIDE: 121 mmol/L — AB (ref 98–111)
CO2: 21 mmol/L — ABNORMAL LOW (ref 22–32)
Calcium: 8.1 mg/dL — ABNORMAL LOW (ref 8.9–10.3)
Creatinine, Ser: 0.78 mg/dL (ref 0.44–1.00)
GFR calc non Af Amer: >60 mL/min (ref >60)
Glucose, Bld: 285 mg/dL — ABNORMAL HIGH (ref 70–99)
POTASSIUM: 2.8 mmol/L — AB (ref 3.5–5.1)
SODIUM: 150 mmol/L — AB (ref 135–145)

## 2018-04-09 LAB — CBC WITH DIFFERENTIAL/PLATELET
Abs Immature Granulocytes: 0.4 10*3/uL — ABNORMAL HIGH (ref 0.0–0.1)
BASOS ABS: 0 10*3/uL (ref 0.0–0.1)
Basophils Relative: 1 %
EOS ABS: 0 10*3/uL (ref 0.0–0.7)
EOS PCT: 0 %
HCT: 27.3 % — ABNORMAL LOW (ref 36.0–46.0)
HEMOGLOBIN: 7.6 g/dL — AB (ref 12.0–15.0)
IMMATURE GRANULOCYTES: 5 %
LYMPHS ABS: 1.6 10*3/uL (ref 0.7–4.0)
LYMPHS PCT: 21 %
MCH: 30.4 pg (ref 26.0–34.0)
MCHC: 27.8 g/dL — AB (ref 30.0–36.0)
MCV: 109.2 fL — ABNORMAL HIGH (ref 78.0–100.0)
Monocytes Absolute: 0.8 10*3/uL (ref 0.1–1.0)
Monocytes Relative: 10 %
NEUTROS PCT: 63 %
Neutro Abs: 4.9 10*3/uL (ref 1.7–7.7)
Platelets: 255 10*3/uL (ref 150–400)
RBC: 2.5 MIL/uL — AB (ref 3.87–5.11)
RDW: 21 % — AB (ref 11.5–15.5)
WBC: 7.8 10*3/uL (ref 4.0–10.5)

## 2018-04-09 LAB — BLOOD GAS, ARTERIAL
ACID-BASE DEFICIT: 2.3 mmol/L — AB (ref 0.0–2.0)
Bicarbonate: 21 mmol/L (ref 20.0–28.0)
Drawn by: 35043
O2 Saturation: 95.5 %
PH ART: 7.463 — AB (ref 7.350–7.450)
Patient temperature: 98.4
pCO2 arterial: 29.7 mmHg — ABNORMAL LOW (ref 32.0–48.0)
pO2, Arterial: 73.7 mmHg — ABNORMAL LOW (ref 83.0–108.0)

## 2018-04-09 LAB — URINE CULTURE: Culture: NO GROWTH

## 2018-04-09 LAB — GLUCOSE, CAPILLARY
GLUCOSE-CAPILLARY: 94 mg/dL (ref 70–99)
GLUCOSE-CAPILLARY: 76 mg/dL (ref 70–99)
Glucose-Capillary: 109 mg/dL — ABNORMAL HIGH (ref 70–99)
Glucose-Capillary: 105 mg/dL — ABNORMAL HIGH (ref 70–99)
Glucose-Capillary: 103 mg/dL — ABNORMAL HIGH (ref 70–99)
Glucose-Capillary: 130 mg/dL — ABNORMAL HIGH (ref 70–99)

## 2018-04-09 LAB — MAGNESIUM: MAGNESIUM: 2.1 mg/dL (ref 1.7–2.4)

## 2018-04-09 MED ORDER — BETHANECHOL CHLORIDE 10 MG PO TABS
10.0000 mg | ORAL_TABLET | Freq: Three times a day (TID) | ORAL | Status: DC
  Administered 2018-04-09 – 2018-04-10 (×2): 10 mg
  Filled 2018-04-09 (×2): qty 1

## 2018-04-09 MED ORDER — SODIUM CHLORIDE 0.9 % IV SOLN
1000.0000 mg | Freq: Once | INTRAVENOUS | Status: AC
  Administered 2018-04-09: 1000 mg via INTRAVENOUS
  Filled 2018-04-09 (×2): qty 20

## 2018-04-09 MED ORDER — INSULIN ASPART 100 UNIT/ML ~~LOC~~ SOLN
0.0000 [IU] | SUBCUTANEOUS | Status: DC
  Administered 2018-04-09 – 2018-04-10 (×2): 2 [IU] via SUBCUTANEOUS
  Administered 2018-04-10: 3 [IU] via SUBCUTANEOUS
  Administered 2018-04-11 (×2): 2 [IU] via SUBCUTANEOUS
  Administered 2018-04-11: 3 [IU] via SUBCUTANEOUS
  Administered 2018-04-12 – 2018-04-14 (×10): 2 [IU] via SUBCUTANEOUS

## 2018-04-09 MED ORDER — LEVETIRACETAM 100 MG/ML PO SOLN
1500.0000 mg | Freq: Two times a day (BID) | ORAL | Status: DC
  Administered 2018-04-09 – 2018-04-13 (×8): 1500 mg
  Filled 2018-04-09 (×8): qty 15

## 2018-04-09 MED ORDER — AMLODIPINE BESYLATE 10 MG PO TABS
10.0000 mg | ORAL_TABLET | Freq: Every day | ORAL | Status: DC
  Administered 2018-04-10 – 2018-04-16 (×7): 10 mg
  Filled 2018-04-09 (×7): qty 1

## 2018-04-09 MED ORDER — POTASSIUM CHLORIDE 20 MEQ/15ML (10%) PO SOLN
40.0000 meq | Freq: Three times a day (TID) | ORAL | Status: AC
  Administered 2018-04-09 – 2018-04-10 (×3): 40 meq
  Filled 2018-04-09 (×3): qty 30

## 2018-04-09 MED ORDER — JEVITY 1.2 CAL PO LIQD
1000.0000 mL | ORAL | Status: DC
  Administered 2018-04-09 – 2018-04-12 (×5): 1000 mL
  Filled 2018-04-09 (×8): qty 1000

## 2018-04-09 MED ORDER — SODIUM CHLORIDE 0.9 % IV SOLN: INTRAVENOUS | Status: DC | PRN

## 2018-04-09 MED ORDER — FAMOTIDINE 20 MG PO TABS
20.0000 mg | ORAL_TABLET | Freq: Every day | ORAL | Status: DC
  Administered 2018-04-10 – 2018-04-16 (×7): 20 mg
  Filled 2018-04-09 (×7): qty 1

## 2018-04-09 MED ORDER — LABETALOL HCL 100 MG PO TABS
100.0000 mg | ORAL_TABLET | Freq: Three times a day (TID) | ORAL | Status: DC
  Administered 2018-04-09 – 2018-04-16 (×19): 100 mg
  Filled 2018-04-09 (×20): qty 1

## 2018-04-09 MED ORDER — ALTEPLASE 2 MG IJ SOLR
2.0000 mg | Freq: Once | INTRAMUSCULAR | Status: AC
  Administered 2018-04-09: 2 mg
  Filled 2018-04-09: qty 2

## 2018-04-09 MED ORDER — PHENYTOIN 125 MG/5ML PO SUSP
100.0000 mg | Freq: Three times a day (TID) | ORAL | Status: DC
  Administered 2018-04-09 – 2018-04-16 (×19): 100 mg
  Filled 2018-04-09 (×26): qty 4

## 2018-04-09 MED ORDER — FREE WATER
200.0000 mL | Freq: Three times a day (TID) | Status: DC
  Administered 2018-04-09 – 2018-04-14 (×16): 200 mL

## 2018-04-09 MED ORDER — CARVEDILOL 25 MG PO TABS
25.0000 mg | ORAL_TABLET | Freq: Two times a day (BID) | ORAL | Status: DC
  Administered 2018-04-09 – 2018-04-16 (×14): 25 mg
  Filled 2018-04-09: qty 2
  Filled 2018-04-09: qty 1
  Filled 2018-04-09 (×2): qty 2
  Filled 2018-04-09: qty 1
  Filled 2018-04-09: qty 2
  Filled 2018-04-09 (×2): qty 1
  Filled 2018-04-09 (×3): qty 2
  Filled 2018-04-09: qty 1
  Filled 2018-04-09: qty 2

## 2018-04-09 MED ORDER — BETHANECHOL CHLORIDE 10 MG PO TABS
10.0000 mg | ORAL_TABLET | Freq: Three times a day (TID) | ORAL | Status: DC
  Administered 2018-04-09 (×2): 10 mg via ORAL
  Filled 2018-04-09 (×2): qty 1

## 2018-04-09 MED ORDER — PHENYTOIN 125 MG/5ML PO SUSP
100.0000 mg | Freq: Three times a day (TID) | ORAL | Status: DC
  Administered 2018-04-09: 100 mg via ORAL
  Filled 2018-04-09 (×2): qty 4

## 2018-04-09 MED ORDER — PRO-STAT SUGAR FREE PO LIQD
30.0000 mL | Freq: Every day | ORAL | Status: DC
  Administered 2018-04-09 – 2018-04-14 (×6): 30 mL
  Filled 2018-04-09 (×6): qty 30

## 2018-04-09 MED ORDER — LEVETIRACETAM 100 MG/ML PO SOLN
1500.0000 mg | Freq: Two times a day (BID) | ORAL | Status: DC
  Administered 2018-04-09: 1500 mg via ORAL
  Filled 2018-04-09: qty 15

## 2018-04-09 MED ORDER — QUETIAPINE FUMARATE 25 MG PO TABS
12.5000 mg | ORAL_TABLET | Freq: Every day | ORAL | Status: DC
  Administered 2018-04-09: 12.5 mg via ORAL
  Filled 2018-04-09: qty 1

## 2018-04-09 NOTE — Progress Notes (Signed)
[facility] TEAM 1 - Stepdown/ICU TEAM  Chrystian Ressler  WEX:[id number] DOB: 1955-08-09 DOA: 03/22/2018 PCP: Burnard Bunting, MD    Brief Narrative:  62 year old female with a history of cerebral aneurysm status post intervention who presented with persistent headache and altered mental status, and was found to have Hunt Hess grade 4 SAH.  Patient was intubated in the ED for airway protection, amd subsequently started on nimodipine.  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  Patient successfully extubated on 9/21.  Working with PT recommending CIR however not candidate.  Palliative care consulted for goals of care.  Significant Events: 9/09 admit  9/10 cerebral angiogram with coiling 9/11 extubated > reintubated due to stridor and started steroids 9/13 wheezing, respiratory acidosis > paralytic x 1, deep sedation. Started on nimbex and solumedrol 9/14 on nimbex due to vent dynschorny. On fent, versed and diprivan. On Cardene for goal sbp <160.  On tube feeds. 9/16 remained paralyzed, diuresed, attempt to get paralytics off  9/17 diuresed very well overnight but Na increased, tolerated nimbex off, minimizing sedation  9/20 diuresed well and beginning active weaning 9/21 Net negative 8 L, weaning well > Extubated 9/22 failed SLP, off precedex 9/23 MBS > dys 1 diet; urinary retention  Subjective: Pt will open her eyes and seems to smile at time, but she does not speak. Intermittent twitching type movement of the shoulders are observed.  She does not appear to be in pain or resp distress.   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 9/10 Care per NS and Neurology   Acute metabolic encephalopathy Reportedly pt had been steadily improving post-op until 9/25 when family noted and acute change to her current MS - Neurology investigating possible seizure activity - CTa has ruled  out new ICH - will complete metabolic panel - slowly normalize Na   Hypernatremia  Free water deficit - slowly improving   Acute respiratory failure with hypoxia & hypercapnia resolved- extubated 9/21  Postextubation stridor 9/11 Resolved   Macrocytic anemia  Check L38 and folic acid   Aspiration PNA SLP mild aspiration risk - treated with Unasyn for aspiration pneumonia - currently not an issue as mental status will not allow oral intake    Hypokalemia Mg is normal - replace and follow   Dysphagia SLP recommending dysphagia 1 diet  Deconditioning  Acute urinary retention Foley was reinserted 9/24 due to urinary retention  Stage 4 breast cancer s/p mastectomy, XRT to brain met, and recently on chemo Chemo induced anemia and thrombocytopenia - will need outpatient oncology follow-up  DVT prophylaxis: SCDs Code Status: FULL CODE Family Communication: no family present at time of exam  Disposition Plan:   Consultants:   Neurosurgery  PCCM  PMR  Palliative care  Antimicrobials:  None presently    Objective: Blood pressure (!) 153/66, pulse 76, temperature 98.5 F (36.9 C), temperature source Oral, resp. rate 14, height '5\' 4"'  (1.626 m), weight 102 kg, last menstrual period 08/14/2010, SpO2 98 %.  Intake/Output Summary (Last 24 hours) at 04/09/2018 1502 Last data filed at 04/09/2018 1200 Gross per 24 hour  Intake 992.95 ml  Output 1025 ml  Net -32.05 ml   Filed Weights   03/22/18 0819 03/23/18 0404  Weight: 90.7 kg 102 kg    Examination: General: No acute respiratory distress Lungs: Clear to auscultation bilaterally without wheezes or crackles Cardiovascular: Regular rate and  rhythm without murmur gallop or rub normal S1 and S2 Abdomen: Nondistended, soft, bowel sounds positive, no rebound, no ascites, no appreciable mass Extremities: No significant cyanosis, clubbing, or edema bilateral lower extremities  CBC: Recent Labs  Lab 04/06/18 0508  04/08/18 0900 04/09/18 0500  WBC 7.2 8.1 7.8  NEUTROABS  --   --  4.9  HGB 8.2* 8.1* 7.6*  HCT 27.7* 29.0* 27.3*  MCV 103.0* 108.6* 109.2*  PLT 304 305 163   Basic Metabolic Panel: Recent Labs  Lab 04/03/18 0510  04/06/18 0513 04/07/18 0448 04/08/18 0900 04/09/18 0500  NA 136   < > 150* 155* 159* 150*  K 2.8*   < > 2.9* 3.0* 2.8* 2.8*  CL 85*   < > 114* 121* 129* 121*  CO2 34*   < > '24 22 22 ' 21*  GLUCOSE 212*   < > 156* 129* 175* 285*  BUN 35*   < > '14 14 14 13  ' CREATININE 0.94   < > 0.79 0.76 0.88 0.78  CALCIUM 9.1   < > 8.8* 8.7* 8.6* 8.1*  MG 2.0   < >  --  2.2 2.3 2.1  PHOS 5.4*  --  3.1 3.1  --   --    < > = values in this interval not displayed.   GFR: Estimated Creatinine Clearance: 86.9 mL/min (by C-G formula based on SCr of 0.78 mg/dL).  Liver Function Tests: Recent Labs  Lab 04/03/18 0510 04/06/18 0513 04/07/18 0448  AST 29  --   --   ALT 40  --   --   ALKPHOS 85  --   --   BILITOT 0.7  --   --   PROT 6.2*  --   --   ALBUMIN 2.6* 2.7* 2.7*    CBG: Recent Labs  Lab 04/07/18 1657 04/07/18 2145 04/08/18 0817 04/08/18 1116 04/08/18 1609  GLUCAP 139* 116* 110* 141* 121*    Recent Results (from the past 240 hour(s))  Culture, blood (routine x 2)     Status: None   Collection Time: 04/02/18 10:26 AM  Result Value Ref Range Status   Specimen Description BLOOD FOOT RIGHT  Final   Special Requests   Final    BOTTLES DRAWN AEROBIC ONLY Blood Culture adequate volume   Culture   Final    NO GROWTH 5 DAYS Performed at [facility], [place] [address], [place], [place] [address]    Report Status 04/07/2018 FINAL  Final  Culture, blood (routine x 2)     Status: None   Collection Time: 04/02/18 10:32 AM  Result Value Ref Range Status   Specimen Description BLOOD FOOT LEFT  Final   Special Requests   Final    BOTTLES DRAWN AEROBIC ONLY Blood Culture adequate volume   Culture   Final    NO GROWTH 5 DAYS Performed at [facility], [place] [address], [place], [place] [address]    Report Status 04/07/2018 FINAL  Final  Culture, respiratory (non-expectorated)     Status: None   Collection Time: 04/02/18 11:18 AM  Result Value Ref Range Status   Specimen Description TRACHEAL ASPIRATE  Final   Special Requests NONE  Final   Gram Stain   Final    MODERATE WBC PRESENT, PREDOMINANTLY PMN MODERATE GRAM POSITIVE COCCI MODERATE GRAM POSITIVE RODS    Culture   Final    Consistent with normal respiratory flora. Performed at [facility], [address] [place], [place] [address]    Report Status 04/04/2018 FINAL  Final  Culture, Urine     Status: None   Collection Time: 04/02/18 12:48 PM  Result Value Ref Range Status   Specimen Description URINE, CATHETERIZED  Final   Special Requests NONE  Final   Culture   Final    NO GROWTH Performed at [facility], [address] [place], [place] [address]    Report Status 04/03/2018 FINAL  Final  Urine Culture     Status: None   Collection Time: 04/07/18  8:00 PM  Result Value Ref Range Status   Specimen Description URINE, CATHETERIZED  Final   Special Requests NONE  Final   Culture   Final    NO GROWTH Performed at [facility], [place] [address] [place], [place] [address]    Report Status 04/09/2018 FINAL  Final     Scheduled Meds: . amLODipine  10 mg Oral Daily  . bethanechol  10 mg Oral TID  . carvedilol  25 mg Oral BID WC  . chlorhexidine gluconate (MEDLINE KIT)  15 mL Mouth Rinse BID  . Chlorhexidine Gluconate Cloth  6 each Topical Daily  . cloNIDine  0.2 mg Transdermal Weekly  . enoxaparin (LOVENOX) injection  30 mg Subcutaneous Q24H  . famotidine  20 mg Oral Daily  . haloperidol lactate  2 mg Intravenous Once  . insulin aspart  0-15 Units Subcutaneous TID WC  . insulin aspart  0-5 Units Subcutaneous QHS  . labetalol  100 mg Oral TID  . levETIRAcetam  1,500 mg Oral BID  . niMODipine  60 mg Oral Q4H   Or  . niMODipine  60 mg Per Tube Q4H  .  phenytoin  100 mg Oral TID  . sodium chloride flush  10-40 mL Intracatheter Q12H   Continuous Infusions: . sodium chloride Stopped (04/09/18 1155)     LOS: 18 days   Cherene Altes, MD [facility] Office  [phone] Pager - Text Page per Shea Evans  If 7PM-7AM, please contact night-coverage per Amion 04/09/2018, 3:02 PM

## 2018-04-09 NOTE — Progress Notes (Signed)
  NEUROSURGERY PROGRESS NOTE   Pts husband reports "rough night," with multiple stereotypical episodes of "tremor" and confusion. Spot EEG done yesterday.  EXAM:  BP (!) 191/73   Pulse 91   Temp 98.6 F (37 C) (Oral)   Resp 14   Ht 5\' 4"  (1.626 m)   Wt 102 kg   LMP 08/14/2010 (Exact Date)   SpO2 98%   BMI 38.60 kg/m   Somnolent but arouses Answers simple questions Follows commands with stimulation in all extremities Stereotypical movements observed during exam involving flexion of elbows, wrists and abduction at shoulders bilaterally. Lasts for a few seconds.  EEG report reviewed suggestive of right temporal onset SZ  IMPRESSION:  62 y.o. female s/p SAH with SZ as likely cause of somnolence.   PLAN: - Will consult neurology, suspect she will need longer term EEG to r/o status - Will add dilantin, already on 1000mg  BID keppra

## 2018-04-09 NOTE — Progress Notes (Signed)
Nutrition Brief Note  Cortrak placed earlier today. Patient seen for full follow-up assessment yesterday afternoon. Consult received this afternoon for TF initiation and management. Will order TF per recommendations left in note yesterday: Jevity 1.2 @ 60 mL/hr with 30 mL Prostat once/day. This regimen will provide 1828 kcal, 95 grams of protein, and 1167 mL free water.  RD will continue to follow per protocol.     Jarome Matin, MS, RD, LDN, [facility] Inpatient Clinical Dietitian Pager # [phone] After hours/weekend pager # [phone]

## 2018-04-09 NOTE — Progress Notes (Signed)
  Speech Language Pathology Treatment: Cognitive-Linquistic  Patient Details Name: Diane Short MRN: [id number] DOB: 1956-03-14 Today's Date: 04/09/2018 Time: 1500-     Assessment / Plan / Recommendation Clinical Impression  Pt has had decreased responsiveness last few days with poor PO intake, less effort to communicate.  Cortrak was placed today given lethargy.  On continuous EEG due to questionable seizure activity.  Palliative medicine has been consulted. No POs offered during session today.  Pt required max tactile/visual/verbal cues to verbalize, recite over-learned sequences.  Poor spontaneity of output; followed simple commands with max verbal cues.  Family present- husband had questions about swallowing/nutrition; answered questions, offered encouragement.  Only allow POs if pt's MS improves.  SLP will continue to follow for swallowing/cognition.    HPI HPI: 62 yo female hx smoking, stage 4 breast cancer with brain met tx with chemo and brain XRT, cerebral aneurysms s/p intervention presented with persistent headache, and altered mental status. Found to have stable intraventricular hemorrhage as well as subarachnoid hemorrhage concentrated in left prepontine cistern and left CP angle cistern. Intubated 9/9-9/11 and re-intubated 9/11-9/21. CXR 9/22 mild patchy/platelike lower lobe opacities, likely atelectasis.         SLP Plan  Continue with current plan of care       Recommendations  Diet recommendations: Dysphagia 1 (puree);Honey-thick liquid(only if alert) Liquids provided via: Cup Medication Administration: Crushed with puree Supervision: Full supervision/cueing for compensatory strategies Compensations: Slow rate;Small sips/bites                Oral Care Recommendations: Oral care QID SLP Visit Diagnosis: Cognitive communication deficit (L49.179) Plan: Continue with current plan of care       GO                Juan Quam Laurice 04/09/2018, 4:03  PM

## 2018-04-09 NOTE — Progress Notes (Signed)
LTM currently running. No skin break down at time of hook up.

## 2018-04-09 NOTE — Consult Note (Addendum)
NEURO HOSPITALIST CONSULT NOTE   Requestig physician: Dr. Kathyrn Sheriff  Reason for Consult: Possible seizures  History obtained from:  Patient / family   HPI:                                                                                                                                          Diane Short is an 62 y.o. female  With PMH of stage 4 breat cancer on chemo with XRT to brain met, 5 cerebral aneurysms with treatment for 3. Originally admitted as a transfer from [place] for [facility]. Neurology consulted for potential seizures on routine EEG.  Per family: patient has been becoming less responsive  for the past week. It has progressed to the point where she is no longer wanting to eat on her own and her speech is incomprehensible.  She has also been having right sided tremors/jerking. The tremors and jerking has been getting closer together with almost no real rest period. Husband states that yesterday she had maybe a 30 minute period of rest. Overnight she was constantly moving and agitated.   Hospital course: Presented to [facility] 03/22/18 after she passed out.  CT head: SAH with mild hydrocephalus; left PCA aneurysm. patient intubated and transfered to [facility] to be seen by neurosurgery. 03/23/18: aneurysm coiled left PCA aneurysm 04/09/18 cortrak ( feeding tube) insertion  04/08/18 routine EEG concerning for seizure in right temporal region.   Past Medical History:  Diagnosis Date  . 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 Anxiety   . Arthritis   . Cancer [facility])    breast cancer  . Cerebral hemorrhage ([place]) 1986  . Depression    due to cancer dx  . Diverticulosis   . GERD (gastroesophageal reflux disease)   . Headache   . History of hiatal hernia   . Hypertension   . 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 Neutropenic fever ([place])    Chemo related  . Osteonecrosis ([place]) 2017-2018   Right Jaw bone  . Thrush, oral    Chemo related    Past Surgical History:  Procedure  Laterality Date  . BREAST SURGERY Left 11/2016   metastic to bone  . CATARACT EXTRACTION Right 03/02/2017  . CEREBRAL ANGIOGRAM  1986  . CESAREAN SECTION  1980  . IR 3D INDEPENDENT [facility]  01/20/2017  . IR 3D INDEPENDENT [facility]  05/26/2017  . IR ANGIO INTRA EXTRACRAN SEL COM CAROTID INNOMINATE UNI R MOD SED  04/09/2017  . IR ANGIO INTRA EXTRACRAN SEL INTERNAL CAROTID BILAT MOD SED  01/20/2017  . IR ANGIO INTRA EXTRACRAN SEL INTERNAL CAROTID BILAT MOD SED  11/17/2017  . IR ANGIO INTRA EXTRACRAN SEL INTERNAL CAROTID BILAT MOD SED  03/23/2018  . IR ANGIO INTRA  EXTRACRAN SEL INTERNAL CAROTID UNI R MOD SED  03/13/2017  . IR ANGIO VERTEBRAL SEL VERTEBRAL BILAT MOD SED  01/20/2017  . IR ANGIO VERTEBRAL SEL VERTEBRAL UNI L MOD SED  05/26/2017  . IR ANGIO VERTEBRAL SEL VERTEBRAL UNI L MOD SED  11/17/2017  . IR ANGIOGRAM FOLLOW UP STUDY  03/13/2017  . IR ANGIOGRAM FOLLOW UP STUDY  03/13/2017  . IR ANGIOGRAM FOLLOW UP STUDY  03/13/2017  . IR ANGIOGRAM FOLLOW UP STUDY  03/13/2017  . IR ANGIOGRAM FOLLOW UP STUDY  03/13/2017  . IR ANGIOGRAM FOLLOW UP STUDY  03/13/2017  . IR ANGIOGRAM FOLLOW UP STUDY  03/13/2017  . IR ANGIOGRAM FOLLOW UP STUDY  03/13/2017  . IR ANGIOGRAM FOLLOW UP STUDY  03/13/2017  . IR ANGIOGRAM FOLLOW UP STUDY  03/13/2017  . IR ANGIOGRAM FOLLOW UP STUDY  03/13/2017  . IR ANGIOGRAM FOLLOW UP STUDY  03/13/2017  . IR ANGIOGRAM FOLLOW UP STUDY  04/09/2017  . IR ANGIOGRAM FOLLOW UP STUDY  05/26/2017  . IR ANGIOGRAM FOLLOW UP STUDY  05/26/2017  . IR ANGIOGRAM FOLLOW UP STUDY  03/23/2018  . IR NEURO EACH ADD'L AFTER BASIC UNI LEFT (MS)  04/09/2017  . IR NEURO EACH ADD'L AFTER BASIC UNI LEFT (MS)  03/23/2018  . IR NEURO EACH ADD'L AFTER BASIC UNI RIGHT (MS)  03/13/2017  . IR TRANSCATH/EMBOLIZ  03/13/2017  . IR TRANSCATH/EMBOLIZ  04/09/2017  . IR TRANSCATH/EMBOLIZ  05/26/2017  . IR TRANSCATH/EMBOLIZ  03/23/2018  . IR US GUIDE VASC ACCESS RIGHT  04/09/2017  . MASTECTOMY Left   . port a cathh  04/2016  . RADIOLOGY  WITH ANESTHESIA N/A 03/13/2017   Procedure: stent coiling of right MCA aneurysm;  Surgeon: Consuella Lose, MD;  Location: [place];  Service: Radiology;  Laterality: N/A;  . RADIOLOGY WITH ANESTHESIA N/A 04/09/2017   Procedure: Arteriogram, pipeline embolization of aneruysm;  Surgeon: Consuella Lose, MD;  Location: [place];  Service: Radiology;  Laterality: N/A;  . RADIOLOGY WITH ANESTHESIA N/A 05/26/2017   Procedure: Stent supported coil embolization;  Surgeon: Consuella Lose, MD;  Location: [place];  Service: Radiology;  Laterality: N/A;  . RADIOLOGY WITH ANESTHESIA N/A 03/23/2018   Procedure: IR WITH ANESTHESIA;  Surgeon: Consuella Lose, MD;  Location: [place];  Service: Radiology;  Laterality: N/A;  . TUBAL LIGATION  1983    Family History  Problem Relation Age of Onset  . Alzheimer's disease Mother   . Colon cancer Father   . Cerebral aneurysm Father   . Diabetes Sister   . Stomach cancer Brother   . Esophageal cancer Brother   . Multiple myeloma Brother   . Hypertension Sister             Social History:  reports that she has been smoking cigarettes. She has a 21.00 pack-year smoking history. She has never used smokeless tobacco. She reports that she does not drink alcohol or use drugs.  Allergies  Allergen Reactions  . Nsaids Other (See Comments)    GI upset   . Ace Inhibitors Other (See Comments)    cough    MEDICATIONS:  Scheduled: . amLODipine  10 mg Oral Daily  . bethanechol  10 mg Oral TID  . carvedilol  25 mg Oral BID WC  . chlorhexidine gluconate (MEDLINE KIT)  15 mL Mouth Rinse BID  . Chlorhexidine Gluconate Cloth  6 each Topical Daily  . cloNIDine  0.2 mg Transdermal Weekly  . enoxaparin (LOVENOX) injection  30 mg Subcutaneous Q24H  . famotidine  20 mg Oral Daily  . haloperidol lactate  2 mg Intravenous Once  . insulin aspart  0-15 Units  Subcutaneous TID WC  . insulin aspart  0-5 Units Subcutaneous QHS  . labetalol  100 mg Oral TID  . levETIRAcetam  1,500 mg Oral BID  . niMODipine  60 mg Oral Q4H   Or  . niMODipine  60 mg Per Tube Q4H  . phenytoin  100 mg Oral TID  . sodium chloride flush  10-40 mL Intracatheter Q12H   Continuous: . dextrose 42 mL/hr at 04/09/18 0700  . phenytoin (DILANTIN) IV     HAL:[id number] **OR** acetaminophen (TYLENOL) oral liquid 160 mg/5 mL **OR** acetaminophen, docusate sodium, hydrALAZINE, ipratropium-albuterol, loperamide, LORazepam, metoprolol tartrate, ondansetron **OR** ondansetron (ZOFRAN) IV, RESOURCE THICKENUP CLEAR   ROS:                                                                                                                                        unobtainable from patient due to mental status  Blood pressure (!) 191/73, pulse 91, temperature 98.6 F (37 C), temperature source Oral, resp. rate 14, height '5\' 4"'  (1.626 m), weight 102 kg, last menstrual period 08/14/2010, SpO2 98 %.   General Examination:                                                                                                       Physical Exam  HEENT-  EEG leads in place  Normal external eye and conjunctiva.   Cardiovascular- S1-S2 audible, pulses palpable throughout   Lungs-no rhonchi or wheezing noted, no excessive working breathing.  Saturations within normal limits on RA Abdomen- All 4 quadrants BS present Extremities- Warm, dry and intact Musculoskeletal-no joint tenderness, deformity or swelling Skin-warm and dry, no hyperpigmentation, vitiligo, or suspicious lesions  Neurological Examination  Mental Status: Somnolent. When awakened she is oriented to name. Speech is incomprehensible when she does speak. Was able to follow commands of stick out your tongue and squeeze my hands. Has difficulty with multiple other commands. LTM running Cranial Nerves: II: III,IV,  VI: was able to track  examiner to right and left side of bed.  ptosis not present,  pupils equal, round, reactive to light  V,VII: face appears symmetric,  VIII: hearing intact to voice XII: midline tongue extension Motor/sensory: Able to move all 4 extremities to noxious stimuli. Was able to squeeze my hands bilaterally with equally weak hand grasps. Not able to cooperate with more detailed motor testing.  The patient exhibits occasional smooth stereotyped hand-waving movements bilaterally without jerking, tremor or twitching.  Plantars: Right: downgoing   Left: downgoing Cerebellar: Unable to follow commands for assessment Gait: deferred   Lab Results: Basic Metabolic Panel: Recent Labs  Lab 04/03/18 0510  04/05/18 0525 04/06/18 0508 04/06/18 0513 04/07/18 0448 04/08/18 0900 04/09/18 0500  NA 136   < > 143  --  150* 155* 159* 150*  K 2.8*   < > 2.8*  --  2.9* 3.0* 2.8* 2.8*  CL 85*   < > 102  --  114* 121* 129* 121*  CO2 34*   < > 27  --  '24 22 22 ' 21*  GLUCOSE 212*   < > 120*  --  156* 129* 175* 285*  BUN 35*   < > 19  --  '14 14 14 13  ' CREATININE 0.94   < > 0.88  --  0.79 0.76 0.88 0.78  CALCIUM 9.1   < > 9.0  --  8.8* 8.7* 8.6* 8.1*  MG 2.0  --   --  2.2  --  2.2 2.3 2.1  PHOS 5.4*  --   --   --  3.1 3.1  --   --    < > = values in this interval not displayed.    CBC: Recent Labs  Lab 04/04/18 0506 04/05/18 0525 04/06/18 0508 04/08/18 0900 04/09/18 0500  WBC 12.4* 10.1 7.2 8.1 7.8  NEUTROABS  --   --   --   --  4.9  HGB 8.3* 8.2* 8.2* 8.1* 7.6*  HCT 26.7* 26.9* 27.7* 29.0* 27.3*  MCV 98.2 99.3 103.0* 108.6* 109.2*  PLT 224 266 304 305 255   Spot EEG 04/08/18: This is an abnormal electroencephalogram secondary to right temporal slowing and sharp activity with phase reversal at T4.  This is consistent with the patient's history of right temporal encephalomalacia and suggests epileptogenic potential.  Imaging: Ct Angio Head W Or Wo Contrast  Result Date: 04/08/2018 CLINICAL DATA:   Cerebral aneurysm, treated, asymptomatic, follow-up. 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 EXAM: CT ANGIOGRAPHY HEAD TECHNIQUE: Multidetector CT imaging of the head was performed using the standard protocol during bolus administration of intravenous contrast. Multiplanar CT image reconstructions and MIPs were obtained to evaluate the vascular anatomy. CONTRAST:  43m ISOVUE-370 IOPAMIDOL (ISOVUE-370) INJECTION 76% COMPARISON:  Head CT 03/23/2018 FINDINGS: CT HEAD Brain: Decreased intracranial hemorrhage, now mainly seen layering in the occipital horns of lateral ventricles. There is no hydrocephalus. No infarct. Vascular: See below Skull: Unremarkable remote right pterional craniotomy Sinuses: Bilateral sphenoid sinusitis with mucosal thickening and fluid levels. There is also left mastoid and middle ear opacification and partial right mastoid opacification. Orbits: Bilateral cataract resection CTA HEAD Anterior circulation: Symmetric carotid arteries, partially obscured on the left due to a pipeline stent covering an aneurysm that shows no detected filling. There is been stent assisted coil embolization of a right MCA bifurcation aneurysm, with very limited assessment in this region due to streak artifact. Adjacent high-density above and  below the coil mass could be scarring or artifact. There is no beading or focal proximal stenosis to imply vasospasm. Known, untreated anterior communicating artery region aneurysm recently evaluated by catheter angiography, 4.6 cm craniocaudal today. There is also an untreated posteriorly projecting smoothly contoured right posterior communicating artery region aneurysm measuring 3 mm. Posterior circulation: Symmetric vertebral arteries. The vertebral and basilar arteries are smooth and widely patent. There is a section of obscured left V4 segment due to coiled PICA aneurysm. No branch occlusion, beading, or significant  stenosis seen. Venous sinuses: Patent Anatomic variants: None significant Delayed phase: No abnormal intracranial enhancement IMPRESSION: 1. No emergent finding. No recurrent hemorrhage, visible infarct, or evident vasospasm. 2. Residual intracranial hemorrhage is seen layering in the occipital horns of the lateral ventricles. No hydrocephalus. 3. Bilateral sphenoid sinusitis. There is extensive left mastoid and middle ear opacification and mild right mastoid opacification. 4. 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 Electronically Signed   By: Monte Fantasia M.D.   On: 04/08/2018 13:33    Assessment: 62 y.o. Female with PMH of stage 4 breast cancer on chemo with XRT to brain met, 5 cerebral aneurysms with treatment for 3. Originally admitted as a transfer from [place] for [facility]. Neurology consulted for potential seizures on routine EEG.  1. Patient loaded with Dialntin 1000 mg with scheduled Dilantin 100 mg TID. Also on Keppra 1500 mg per neurosurgery team. 2. Routine EEG yesterday: This is an abnormal electroencephalogram secondary to right temporal slowing and sharp activity with phase reversal at T4.  This is consistent with the patient's history of right temporal encephalomalacia and suggests epileptogenic potential. 3. Bedside preliminary evaluation of LTM EEG shows asymmetry with trains of high amplitude right temporal theta activity but there is no clear evolution or alteration of the background with this. She does have intermittent stereotyped hand waving on exam but the movements are smooth without jerking or oscillatory movement. A captured video of patient exhibiting stereotyped head turning to the left with left arm stereotyped movements was captured by family yesterday and was viewed by the examiner, but it is unclear if  this is due to seizure due to the short duration (about 10 seconds) versus behavioral due to cognitive impairment in the setting of intraventricular blood visible on CT angio from yesterday.     Recommendations: -Continue LTM EEG -correct metabolic derangements (primary team) -continue Keppra -continue dilantin -neurology will continue to follow.  Laurey Morale, MSN, NP-C [facility] [phone]   I have seen and examined the patient. I have amended the exam, assessment and recommendations above.  Electronically signed: Dr. Kerney Elbe 04/09/2018, 9:33 AM

## 2018-04-09 NOTE — Progress Notes (Signed)
Cortrak Tube Team Note:  Consult received to place a Cortrak feeding tube.   A 10 F Cortrak tube was placed in the L nare and secured with a nasal tape and clip at 67 cm. Per the Cortrak monitor reading the tube tip is in the stomach.   No x-ray is required. RN may begin using tube.    If the tube becomes dislodged please keep the tube and contact the Cortrak team at [email or web address] ([email or web address]) for replacement.  If after hours and replacement cannot be delayed, place a NG tube and confirm placement with an abdominal x-ray.     Jarome Matin, MS, RD, LDN, [facility] Inpatient Clinical Dietitian Pager # [phone] After hours/weekend pager # [phone]

## 2018-04-09 NOTE — Plan of Care (Signed)
Pt with continued lethargy and minimal responsiveness. Will awaken to voice and state her name, but otherwise speech incomprehensible. D/t aspiration risk, AM medications held and cortrak order placed. Questionable seizure activity, continuous EEG started and keppra/dilantin added. Pt with urinary retention, urecholine ordered today. Will continue to monitor.

## 2018-04-09 NOTE — Progress Notes (Signed)
Daily Progress Note   Patient Name: Diane Short       Date: 04/09/2018 DOB: 04-03-1956  Age: 62 y.o. MRN#: [id number] Attending Physician: Cherene Altes, MD Primary Care Physician: Burnard Bunting, MD Admit Date: 03/22/2018  Reason for Consultation/Follow-up: Establishing goals of care  Subjective: Continuous EEG being set-up with patient, husband at bedside, tearful  Length of Stay: 18  Current Medications: Scheduled Meds:  . amLODipine  10 mg Oral Daily  . bethanechol  10 mg Oral TID  . carvedilol  25 mg Oral BID WC  . chlorhexidine gluconate (MEDLINE KIT)  15 mL Mouth Rinse BID  . Chlorhexidine Gluconate Cloth  6 each Topical Daily  . cloNIDine  0.2 mg Transdermal Weekly  . enoxaparin (LOVENOX) injection  30 mg Subcutaneous Q24H  . famotidine  20 mg Oral Daily  . haloperidol lactate  2 mg Intravenous Once  . insulin aspart  0-15 Units Subcutaneous TID WC  . insulin aspart  0-5 Units Subcutaneous QHS  . labetalol  100 mg Oral TID  . levETIRAcetam  1,500 mg Oral BID  . niMODipine  60 mg Oral Q4H   Or  . niMODipine  60 mg Per Tube Q4H  . phenytoin  100 mg Oral TID  . sodium chloride flush  10-40 mL Intracatheter Q12H    Continuous Infusions: . sodium chloride Stopped (04/09/18 1155)    PRN Meds: sodium chloride, acetaminophen **OR** acetaminophen (TYLENOL) oral liquid 160 mg/5 mL **OR** acetaminophen, docusate sodium, hydrALAZINE, ipratropium-albuterol, loperamide, LORazepam, metoprolol tartrate, ondansetron **OR** ondansetron (ZOFRAN) IV, RESOURCE THICKENUP CLEAR  Physical Exam         Constitutional: She appears well-developed and well-nourished. She appears lethargic. No distress.  HENT:  Head: Normocephalic and atraumatic.  Cardiovascular: Normal rate and regular  rhythm.  Pulmonary/Chest: Effort normal and breath sounds normal. No respiratory distress.  Abdominal: Soft.  Neurological: She appears lethargic. She is disoriented.  Skin: Skin is warm and dry. She is not diaphoretic.  Psychiatric: Cognition and memory are impaired.   Vital Signs: BP (!) 144/63 (BP Location: Left Leg)   Pulse 81   Temp 98.5 F (36.9 C) (Oral)   Resp 14   Ht _0  (1.626 m)   Wt 102 kg   LMP 08/14/2010 (Exact Date)   SpO2 97%   BMI 38.60 kg/m  SpO2: SpO2: 97 % O2 Device: O2 Device: Room Air O2 Flow Rate: O2 Flow Rate (L/min): 1 L/min  Intake/output summary:   Intake/Output Summary (Last 24 hours) at 04/09/2018 1436 Last data filed at 04/09/2018 1200 Gross per 24 hour  Intake 992.95 ml  Output 1025 ml  Net -32.05 ml   LBM: Last BM Date: 04/08/18 Baseline Weight: Weight: 90.7 kg Most recent weight: Weight: 102 kg       Palliative Assessment/Data: PPS 20%    Flowsheet Rows     Most Recent Value  Intake Tab  Referral Department  Hospitalist  Unit at Time of Referral  Intermediate Care Unit  Palliative Care Primary Diagnosis  Neurology  Date Notified  04/07/18  Palliative Care Type  New Palliative care  Reason for referral  Clarify Goals of Care  Date of Admission  03/22/18  Date first seen by Palliative Care  04/08/18  # of days Palliative referral response time  1 Day(s)  # of days IP prior to Palliative referral  16  Clinical Assessment  Palliative Performance Scale Score  20%  Psychosocial & Spiritual Assessment  Palliative Care Outcomes  Patient/Family meeting held?  Yes  Who was at the meeting?  husband, daughter, 2 friends  Palliative Care Outcomes  Clarified goals of care, Provided psychosocial or spiritual support, ACP counseling assistance      Patient Active Problem List   Diagnosis Date Noted  . Goals of care, counseling/discussion   . Palliative care by specialist   . Adult failure to thrive   . Endotracheal tube present   .  SAH (subarachnoid hemorrhage) ([place]) 03/22/2018  . Malignant neoplasm of left breast in female, estrogen receptor negative ([place]) 01/07/2018  . Brain metastasis ([place]) 01/07/2018  . Cerebral aneurysm, nonruptured 03/13/2017    Palliative Care Assessment & Plan   HPI: 62 y.o. 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 She required intubation for airway protection. CT scan revealed SAH. 9/10 she had a cerebral angiogram with coiling. She was successfully extubated 9/21. She failed swallow study 9/22 , but after MBS on 9/23 a dys 1 diet was recommended. 9/25 and 9/26 increasingly lethargic and increasing frequency of tremors. To have repeat CT and EEG 9/26. PT recommended CIR; however, patient was deemed inappropriate as she is unable to participate in intensive therapy. PMT consulted by MD because PEG tube was recommended by physical rehab MD. PMT to assist with goals of care.  Assessment: Family and nursing reports patient had a rough night. Temporary feeding tube placed to administer medications. Patient more lethargic. Family wishes are to continue current care for now and see how the weekend goes. If she continues to decline over the weekend they may be interested in transitioning care to more of a comfort approach and will readdress code status. Husband expresses concerns about patient's length of time without cancer treatment. He has good understanding of severity of patient's illness.   Recommendations/Plan:  Full code/full scope  PMT will meet with family early next week to readdress [place]  Goals of Care and Additional Recommendations:  Limitations on Scope of Treatment: Full Scope Treatment  Code  Status:  Full code  Prognosis:   Unable to determine  Discharge Planning:  To Be Determined  Care plan was discussed with RN, patient's spouse  Thank you for allowing the Palliative Medicine Team to assist in the care of this patient.   Total Time 20 minutes Prolonged Time Billed  no       Greater than 50%  of this time was spent counseling and coordinating care related to the above assessment and plan.  Juel Burrow, DNP, [facility] Palliative Medicine Team Team Phone # [phone]  Pager [phone]

## 2018-04-10 LAB — GLUCOSE, CAPILLARY
GLUCOSE-CAPILLARY: 113 mg/dL — AB (ref 70–99)
GLUCOSE-CAPILLARY: 98 mg/dL (ref 70–99)
Glucose-Capillary: 122 mg/dL — ABNORMAL HIGH (ref 70–99)
Glucose-Capillary: 170 mg/dL — ABNORMAL HIGH (ref 70–99)
Glucose-Capillary: 136 mg/dL — ABNORMAL HIGH (ref 70–99)

## 2018-04-10 LAB — COMPREHENSIVE METABOLIC PANEL
ALBUMIN: 2.8 g/dL — AB (ref 3.5–5.0)
ALT: 25 U/L (ref 0–44)
AST: 23 U/L (ref 15–41)
Alkaline Phosphatase: 60 U/L (ref 38–126)
Anion gap: 7 (ref 5–15)
BUN: 13 mg/dL (ref 6–20)
CHLORIDE: 127 mmol/L — AB (ref 98–111)
CO2: 22 mmol/L (ref 22–32)
CREATININE: 0.75 mg/dL (ref 0.44–1.00)
Calcium: 8.7 mg/dL — ABNORMAL LOW (ref 8.9–10.3)
GFR calc Af Amer: >60 mL/min (ref >60)
GFR calc non Af Amer: >60 mL/min (ref >60)
GLUCOSE: 156 mg/dL — AB (ref 70–99)
Potassium: 3.5 mmol/L (ref 3.5–5.1)
SODIUM: 156 mmol/L — AB (ref 135–145)
Total Bilirubin: 0.5 mg/dL (ref 0.3–1.2)
Total Protein: 6.2 g/dL — ABNORMAL LOW (ref 6.5–8.1)

## 2018-04-10 LAB — IRON AND TIBC
Iron: 82 ug/dL (ref 28–170)
Saturation Ratios: 38 % — ABNORMAL HIGH (ref 10.4–31.8)
TIBC: 217 ug/dL — ABNORMAL LOW (ref 250–450)
UIBC: 135 ug/dL

## 2018-04-10 LAB — CBC
HCT: 29.9 % — ABNORMAL LOW (ref 36.0–46.0)
Hemoglobin: 8.5 g/dL — ABNORMAL LOW (ref 12.0–15.0)
MCH: 31 pg (ref 26.0–34.0)
MCHC: 28.4 g/dL — AB (ref 30.0–36.0)
MCV: 109.1 fL — AB (ref 78.0–100.0)
PLATELETS: 224 10*3/uL (ref 150–400)
RBC: 2.74 MIL/uL — ABNORMAL LOW (ref 3.87–5.11)
RDW: 20.9 % — AB (ref 11.5–15.5)
WBC: 8.5 10*3/uL (ref 4.0–10.5)

## 2018-04-10 LAB — BASIC METABOLIC PANEL
ANION GAP: 6 (ref 5–15)
BUN: 13 mg/dL (ref 6–20)
CALCIUM: 8.4 mg/dL — AB (ref 8.9–10.3)
CO2: 20 mmol/L — ABNORMAL LOW (ref 22–32)
Chloride: 127 mmol/L — ABNORMAL HIGH (ref 98–111)
Creatinine, Ser: 0.69 mg/dL (ref 0.44–1.00)
GFR calc non Af Amer: >60 mL/min (ref >60)
GLUCOSE: 112 mg/dL — AB (ref 70–99)
POTASSIUM: 3.7 mmol/L (ref 3.5–5.1)
Sodium: 153 mmol/L — ABNORMAL HIGH (ref 135–145)

## 2018-04-10 LAB — CORTISOL: Cortisol, Plasma: 23 ug/dL

## 2018-04-10 LAB — RETICULOCYTES
RBC.: 2.74 MIL/uL — ABNORMAL LOW (ref 3.87–5.11)
RETIC CT PCT: 3.5 % — AB (ref 0.4–3.1)
Retic Count, Absolute: 95.9 10*3/uL (ref 19.0–186.0)

## 2018-04-10 LAB — FOLATE: Folate: 29 ng/mL (ref >5.9)

## 2018-04-10 LAB — FERRITIN: FERRITIN: 339 ng/mL — AB (ref 11–307)

## 2018-04-10 LAB — TSH: TSH: 1.676 u[IU]/mL (ref 0.350–4.500)

## 2018-04-10 LAB — VITAMIN B12: Vitamin B-12: 856 pg/mL (ref 180–914)

## 2018-04-10 LAB — PHENYTOIN LEVEL, TOTAL: Phenytoin Lvl: 9.1 ug/mL — ABNORMAL LOW (ref 10.0–20.0)

## 2018-04-10 LAB — AMMONIA: AMMONIA: 21 umol/L (ref 9–35)

## 2018-04-10 LAB — RPR: RPR Ser Ql: NONREACTIVE

## 2018-04-10 LAB — LEVETIRACETAM LEVEL: Levetiracetam Lvl: 42.8 ug/mL — ABNORMAL HIGH (ref 10.0–40.0)

## 2018-04-10 MED ORDER — HALOPERIDOL LACTATE 2 MG/ML PO CONC
2.0000 mg | Freq: Four times a day (QID) | ORAL | Status: DC | PRN
  Administered 2018-04-10 – 2018-04-12 (×7): 2 mg
  Filled 2018-04-10 (×8): qty 1

## 2018-04-10 MED ORDER — FLUCONAZOLE 40 MG/ML PO SUSR
100.0000 mg | Freq: Every day | ORAL | Status: AC
  Administered 2018-04-10 – 2018-04-16 (×7): 100 mg
  Filled 2018-04-10 (×7): qty 2.5

## 2018-04-10 MED ORDER — LOPERAMIDE HCL 1 MG/7.5ML PO SUSP
2.0000 mg | Freq: Four times a day (QID) | ORAL | Status: AC
  Administered 2018-04-10 – 2018-04-11 (×3): 2 mg
  Filled 2018-04-10 (×4): qty 15

## 2018-04-10 MED ORDER — SODIUM CHLORIDE 0.9% FLUSH
10.0000 mL | INTRAVENOUS | Status: DC | PRN
  Administered 2018-04-10: 20 mL

## 2018-04-10 MED ORDER — QUETIAPINE FUMARATE 50 MG PO TABS
25.0000 mg | ORAL_TABLET | Freq: Every day | ORAL | Status: DC
  Administered 2018-04-10 – 2018-04-15 (×6): 25 mg via ORAL
  Filled 2018-04-10 (×6): qty 1

## 2018-04-10 MED ORDER — POTASSIUM CL IN DEXTROSE 5% 20 MEQ/L IV SOLN
20.0000 meq | INTRAVENOUS | Status: DC
  Administered 2018-04-10 – 2018-04-12 (×3): 20 meq via INTRAVENOUS
  Filled 2018-04-10 (×4): qty 1000

## 2018-04-10 MED ORDER — CHLORHEXIDINE GLUCONATE CLOTH 2 % EX PADS
6.0000 | MEDICATED_PAD | Freq: Every day | CUTANEOUS | Status: DC
  Administered 2018-04-10: 6 via TOPICAL

## 2018-04-10 NOTE — Progress Notes (Signed)
Cortrak Tube Team Note:  Consult received to REplace a Cortrak feeding tube. Patient had involuntarily dislodged cortrak tube early this AM.   A 10 F Cortrak tube was placed in the R nare and secured with a nasal bridle at 75 cm. Per the Cortrak monitor reading the tube tip is located at the pylorus.   No x-ray is required. RN may begin using tube.   If the tube becomes dislodged please keep the tube and contact the Cortrak team at [email or web address] (password [email or web address]) for replacement.  If after hours and replacement cannot be delayed, place a NG tube and confirm placement with an abdominal x-ray.   Burtis Junes RD, LDN, CNSC Clinical Nutrition Available Tues-Sat via Pager: [phone] 04/10/2018 9:49 AM

## 2018-04-10 NOTE — Progress Notes (Signed)
Old clonidine patch removed from left posterior shoulder while cleaning up the patient, order discontinued yesterday.

## 2018-04-10 NOTE — Progress Notes (Addendum)
Subjective: Resting more consistently with less agitation per family.   Objective: Current vital signs: BP 114/88   Pulse 87   Temp 99.4 F (37.4 C)   Resp 14   Ht '5\' 4"'  (1.626 m)   Wt 93.9 kg   LMP 08/14/2010 (Exact Date)   SpO2 100%   BMI 35.53 kg/m  Vital signs in last 24 hours: Temp:  [98.1 F (36.7 C)-100.7 F (38.2 C)] 99.4 F (37.4 C) (09/28 0800) Pulse Rate:  [70-88] 87 (09/28 0600) BP: (114-175)/(52-103) 114/88 (09/28 0600) SpO2:  [97 %-100 %] 100 % (09/28 0600) Weight:  [93.9 kg-95.5 kg] 93.9 kg (09/28 0426)  Intake/Output from previous day: 09/27 0701 - 09/28 0700 In: 2197.4 [I.V.:461.2; KP/VV:7482; IV Piggyback:270.1] Out: 1350 [Urine:1150; Stool:200] Intake/Output this shift: No intake/output data recorded. Nutritional status:  Diet Order            DIET - DYS 1 Room service appropriate? Yes; Fluid consistency: Honey Thick  Diet effective now              Neurological Examination  Mental Status: Continues to be somnolent. Will arouse briefly to tactile stimulation. Not following commands well this morning. Exhibits motor agitation at times. LTM running Cranial Nerves: II: III,IV, VI: Intermittently will fixate on visual stimuli. At other times with short duration roving EOM. No forced gaze deviation or nystagmus noted.   V,VII: face appears symmetric  VIII: hearing intact to voice Motor/sensory: Able to move all 4 extremities to stimuli. No jerking, tremor or twitching noted.  Cerebellar: Unable to follow commands for assessment Gait: Deferred  Lab Results: Results for orders placed or performed during the hospital encounter of 03/22/18 (from the past 48 hour(s))  Glucose, capillary     Status: Abnormal   Collection Time: 04/08/18 11:16 AM  Result Value Ref Range   Glucose-Capillary 141 (H) 70 - 99 mg/dL  Glucose, capillary     Status: Abnormal   Collection Time: 04/08/18  4:09 PM  Result Value Ref Range   Glucose-Capillary 121 (H) 70 - 99  mg/dL  Glucose, capillary     Status: None   Collection Time: 04/08/18  9:13 PM  Result Value Ref Range   Glucose-Capillary 94 70 - 99 mg/dL  Basic metabolic panel     Status: Abnormal   Collection Time: 04/09/18  5:00 AM  Result Value Ref Range   Sodium 150 (H) 135 - 145 mmol/L   Potassium 2.8 (L) 3.5 - 5.1 mmol/L   Chloride 121 (H) 98 - 111 mmol/L   CO2 21 (L) 22 - 32 mmol/L   Glucose, Bld 285 (H) 70 - 99 mg/dL   BUN 13 6 - 20 mg/dL   Creatinine, Ser 0.78 0.44 - 1.00 mg/dL   Calcium 8.1 (L) 8.9 - 10.3 mg/dL   GFR calc non Af Amer >60 >60 mL/min   GFR calc Af Amer >60 >60 mL/min    Comment: (NOTE) The eGFR has been calculated using the CKD EPI equation. This calculation has not been validated in all clinical situations. eGFR's persistently <60 mL/min signify possible Chronic Kidney Disease.    Anion gap 8 5 - 15    Comment: Performed at [facility] [address], [place], [place] [address]  CBC with Differential/Platelet     Status: Abnormal   Collection Time: 04/09/18  5:00 AM  Result Value Ref Range   WBC 7.8 4.0 - 10.5 K/uL   RBC 2.50 (L) 3.87 - 5.11 MIL/uL  Hemoglobin 7.6 (L) 12.0 - 15.0 g/dL    Comment: REPEATED TO VERIFY   HCT 27.3 (L) 36.0 - 46.0 %   MCV 109.2 (H) 78.0 - 100.0 fL   MCH 30.4 26.0 - 34.0 pg   MCHC 27.8 (L) 30.0 - 36.0 g/dL   RDW 21.0 (H) 11.5 - 15.5 %   Platelets 255 150 - 400 K/uL    Comment: REPEATED TO VERIFY   Neutrophils Relative % 63 %   Neutro Abs 4.9 1.7 - 7.7 K/uL   Lymphocytes Relative 21 %   Lymphs Abs 1.6 0.7 - 4.0 K/uL   Monocytes Relative 10 %   Monocytes Absolute 0.8 0.1 - 1.0 K/uL   Eosinophils Relative 0 %   Eosinophils Absolute 0.0 0.0 - 0.7 K/uL   Basophils Relative 1 %   Basophils Absolute 0.0 0.0 - 0.1 K/uL   Immature Granulocytes 5 %   Abs Immature Granulocytes 0.4 (H) 0.0 - 0.1 K/uL    Comment: Performed at [facility], [address], [place], [place] [address]  Magnesium     Status: None    Collection Time: 04/09/18  5:00 AM  Result Value Ref Range   Magnesium 2.1 1.7 - 2.4 mg/dL    Comment: Performed at [facility], [place] [address], [place], [place] [address]  Glucose, capillary     Status: Abnormal   Collection Time: 04/09/18  8:28 AM  Result Value Ref Range   Glucose-Capillary 109 (H) 70 - 99 mg/dL  Glucose, capillary     Status: Abnormal   Collection Time: 04/09/18 11:10 AM  Result Value Ref Range   Glucose-Capillary 105 (H) 70 - 99 mg/dL  Glucose, capillary     Status: Abnormal   Collection Time: 04/09/18  4:46 PM  Result Value Ref Range   Glucose-Capillary 103 (H) 70 - 99 mg/dL  Glucose, capillary     Status: Abnormal   Collection Time: 04/09/18  7:40 PM  Result Value Ref Range   Glucose-Capillary 130 (H) 70 - 99 mg/dL  Blood gas, arterial     Status: Abnormal   Collection Time: 04/09/18 10:35 PM  Result Value Ref Range   Delivery systems ROOM AIR    pH, Arterial 7.463 (H) 7.350 - 7.450   pCO2 arterial 29.7 (L) 32.0 - 48.0 mmHg   pO2, Arterial 73.7 (L) 83.0 - 108.0 mmHg   Bicarbonate 21.0 20.0 - 28.0 mmol/L   Acid-base deficit 2.3 (H) 0.0 - 2.0 mmol/L   O2 Saturation 95.5 %   Patient temperature 98.4    Collection site RIGHT RADIAL    Drawn by [phone]    Sample type ARTERIAL    Allens test (pass/fail) PASS PASS  Glucose, capillary     Status: None   Collection Time: 04/09/18 11:20 PM  Result Value Ref Range   Glucose-Capillary 76 70 - 99 mg/dL  Glucose, capillary     Status: Abnormal   Collection Time: 04/10/18  3:51 AM  Result Value Ref Range   Glucose-Capillary 122 (H) 70 - 99 mg/dL  Comprehensive metabolic panel     Status: Abnormal   Collection Time: 04/10/18  4:34 AM  Result Value Ref Range   Sodium 156 (H) 135 - 145 mmol/L   Potassium 3.5 3.5 - 5.1 mmol/L   Chloride 127 (H) 98 - 111 mmol/L   CO2 22 22 - 32 mmol/L   Glucose, Bld 156 (H) 70 - 99 mg/dL   BUN 13 6 -  20 mg/dL   Creatinine, Ser 0.75 0.44 - 1.00 mg/dL   Calcium 8.7 (L) 8.9 -  10.3 mg/dL   Total Protein 6.2 (L) 6.5 - 8.1 g/dL   Albumin 2.8 (L) 3.5 - 5.0 g/dL   AST 23 15 - 41 U/L   ALT 25 0 - 44 U/L   Alkaline Phosphatase 60 38 - 126 U/L   Total Bilirubin 0.5 0.3 - 1.2 mg/dL   GFR calc non Af Amer >60 >60 mL/min   GFR calc Af Amer >60 >60 mL/min    Comment: (NOTE) The eGFR has been calculated using the CKD EPI equation. This calculation has not been validated in all clinical situations. eGFR's persistently <60 mL/min signify possible Chronic Kidney Disease.    Anion gap 7 5 - 15    Comment: Performed at [facility] [address], [place], [place] [address]  CBC     Status: Abnormal   Collection Time: 04/10/18  4:34 AM  Result Value Ref Range   WBC 8.5 4.0 - 10.5 K/uL   RBC 2.74 (L) 3.87 - 5.11 MIL/uL   Hemoglobin 8.5 (L) 12.0 - 15.0 g/dL   HCT 29.9 (L) 36.0 - 46.0 %   MCV 109.1 (H) 78.0 - 100.0 fL   MCH 31.0 26.0 - 34.0 pg   MCHC 28.4 (L) 30.0 - 36.0 g/dL   RDW 20.9 (H) 11.5 - 15.5 %   Platelets 224 150 - 400 K/uL    Comment: Performed at [facility], [place] [address] [place], [place] [address]  Vitamin B12     Status: None   Collection Time: 04/10/18  4:34 AM  Result Value Ref Range   Vitamin B-12 856 180 - 914 pg/mL    Comment: (NOTE) This assay is not validated for testing neonatal or myeloproliferative syndrome specimens for Vitamin B12 levels. Performed at [facility], [place] [address], [place], [place] [address]   Folate     Status: None   Collection Time: 04/10/18  4:34 AM  Result Value Ref Range   Folate 29.0 >5.9 ng/mL    Comment: Performed at [facility] [address], [place], [place] [address]  Iron and TIBC     Status: Abnormal   Collection Time: 04/10/18  4:34 AM  Result Value Ref Range   Iron 82 28 - 170 ug/dL   TIBC 217 (L) 250 - 450 ug/dL   Saturation Ratios 38 (H) 10.4 - 31.8 %   UIBC 135 ug/dL    Comment: Performed at [facility], [place] [address] [place], [place] [address]  Ferritin      Status: Abnormal   Collection Time: 04/10/18  4:34 AM  Result Value Ref Range   Ferritin 339 (H) 11 - 307 ng/mL    Comment: Performed at [facility], [place] [address], [place], [place] [address]  Reticulocytes     Status: Abnormal   Collection Time: 04/10/18  4:34 AM  Result Value Ref Range   Retic Ct Pct 3.5 (H) 0.4 - 3.1 %   RBC. 2.74 (L) 3.87 - 5.11 MIL/uL   Retic Count, Absolute 95.9 19.0 - 186.0 K/uL    Comment: Performed at [facility] [address], [place], [place] [address]  Ammonia     Status: None   Collection Time: 04/10/18  4:34 AM  Result Value Ref Range   Ammonia 21 9 - 35 umol/L    Comment:  Performed at [facility], [place] [address], [place], [place] [address]  TSH     Status: None   Collection Time: 04/10/18  4:34 AM  Result Value Ref Range   TSH 1.676 0.350 - 4.500 uIU/mL    Comment: Performed by a 3rd Generation assay with a functional sensitivity of <=0.01 uIU/mL. Performed at [facility], [place] [address] [place], [place] [address]   Cortisol     Status: None   Collection Time: 04/10/18  4:34 AM  Result Value Ref Range   Cortisol, Plasma 23.0 ug/dL    Comment: (NOTE) AM    6.7 - 22.6 ug/dL PM   <10.0       ug/dL Performed at [facility] [address], [place], [place] [address]   Glucose, capillary     Status: Abnormal   Collection Time: 04/10/18  7:53 AM  Result Value Ref Range   Glucose-Capillary 113 (H) 70 - 99 mg/dL    Recent Results (from the past 240 hour(s))  Culture, blood (routine x 2)     Status: None   Collection Time: 04/02/18 10:26 AM  Result Value Ref Range Status   Specimen Description BLOOD FOOT RIGHT  Final   Special Requests   Final    BOTTLES DRAWN AEROBIC ONLY Blood Culture adequate volume   Culture   Final    NO GROWTH 5 DAYS Performed at [facility], [place] [address], [place], [place] [address]    Report Status 04/07/2018 FINAL  Final  Culture, blood (routine x 2)     Status: None    Collection Time: 04/02/18 10:32 AM  Result Value Ref Range Status   Specimen Description BLOOD FOOT LEFT  Final   Special Requests   Final    BOTTLES DRAWN AEROBIC ONLY Blood Culture adequate volume   Culture   Final    NO GROWTH 5 DAYS Performed at [facility], [place] [address] [place], [place] [address]    Report Status 04/07/2018 FINAL  Final  Culture, respiratory (non-expectorated)     Status: None   Collection Time: 04/02/18 11:18 AM  Result Value Ref Range Status   Specimen Description TRACHEAL ASPIRATE  Final   Special Requests NONE  Final   Gram Stain   Final    MODERATE WBC PRESENT, PREDOMINANTLY PMN MODERATE GRAM POSITIVE COCCI MODERATE GRAM POSITIVE RODS    Culture   Final    Consistent with normal respiratory flora. Performed at [facility], [place] [address] [place], [place] [address]    Report Status 04/04/2018 FINAL  Final  Culture, Urine     Status: None   Collection Time: 04/02/18 12:48 PM  Result Value Ref Range Status   Specimen Description URINE, CATHETERIZED  Final   Special Requests NONE  Final   Culture   Final    NO GROWTH Performed at [facility], [place] [address] [place], [place] [address]    Report Status 04/03/2018 FINAL  Final  Urine Culture     Status: None   Collection Time: 04/07/18  8:00 PM  Result Value Ref Range Status   Specimen Description URINE, CATHETERIZED  Final   Special Requests NONE  Final   Culture   Final    NO GROWTH Performed at [facility], [place] [address] [place],  [address]    Report Status 04/09/2018 FINAL  Final    Lipid Panel No results  for input(s): CHOL, TRIG, HDL, CHOLHDL, VLDL, LDLCALC in the last 72 hours.  Studies/Results: Ct Angio Head W Or Wo Contrast  Result Date: 04/08/2018 CLINICAL DATA:  Cerebral aneurysm, treated, asymptomatic, follow-up. 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 EXAM: CT ANGIOGRAPHY HEAD  TECHNIQUE: Multidetector CT imaging of the head was performed using the standard protocol during bolus administration of intravenous contrast. Multiplanar CT image reconstructions and MIPs were obtained to evaluate the vascular anatomy. CONTRAST:  49m ISOVUE-370 IOPAMIDOL (ISOVUE-370) INJECTION 76% COMPARISON:  Head CT 03/23/2018 FINDINGS: CT HEAD Brain: Decreased intracranial hemorrhage, now mainly seen layering in the occipital horns of lateral ventricles. There is no hydrocephalus. No infarct. Vascular: See below Skull: Unremarkable remote right pterional craniotomy Sinuses: Bilateral sphenoid sinusitis with mucosal thickening and fluid levels. There is also left mastoid and middle ear opacification and partial right mastoid opacification. Orbits: Bilateral cataract resection CTA HEAD Anterior circulation: Symmetric carotid arteries, partially obscured on the left due to a pipeline stent covering an aneurysm that shows no detected filling. There is been stent assisted coil embolization of a right MCA bifurcation aneurysm, with very limited assessment in this region due to streak artifact. Adjacent high-density above and below the coil mass could be scarring or artifact. There is no beading or focal proximal stenosis to imply vasospasm. Known, untreated anterior communicating artery region aneurysm recently evaluated by catheter angiography, 4.6 cm craniocaudal today. There is also an untreated posteriorly projecting smoothly contoured right posterior communicating artery region aneurysm measuring 3 mm. Posterior circulation: Symmetric vertebral arteries. The vertebral and basilar arteries are smooth and widely patent. There is a section of obscured left V4 segment due to coiled PICA aneurysm. No branch occlusion, beading, or significant stenosis seen. Venous sinuses: Patent Anatomic variants: None significant Delayed phase: No abnormal intracranial enhancement IMPRESSION: 1. No emergent finding. No recurrent  hemorrhage, visible infarct, or evident vasospasm. 2. Residual intracranial hemorrhage is seen layering in the occipital horns of the lateral ventricles. No hydrocephalus. 3. Bilateral sphenoid sinusitis. There is extensive left mastoid and middle ear opacification and mild right mastoid opacification. 4. 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 Electronically Signed   By: JMonte FantasiaM.D.   On: 04/08/2018 13:33    Medications:  Scheduled: . amLODipine  10 mg Per Tube Daily  . bethanechol  10 mg Per Tube TID  . carvedilol  25 mg Per Tube BID WC  . chlorhexidine gluconate (MEDLINE KIT)  15 mL Mouth Rinse BID  . Chlorhexidine Gluconate Cloth  6 each Topical Daily  . Chlorhexidine Gluconate Cloth  6 each Topical Daily  . enoxaparin (LOVENOX) injection  30 mg Subcutaneous Q24H  . famotidine  20 mg Per Tube Daily  . feeding supplement (PRO-STAT SUGAR FREE 64)  30 mL Per Tube Daily  . free water  200 mL Per Tube Q8H  . insulin aspart  0-15 Units Subcutaneous Q4H  . labetalol  100 mg Per Tube TID  . levETIRAcetam  1,500 mg Per Tube BID  . niMODipine  60 mg Oral Q4H   Or  . niMODipine  60 mg Per Tube Q4H  . phenytoin  100 mg Per Tube TID  . potassium chloride  40 mEq Per Tube TID  . QUEtiapine  12.5 mg Oral QHS  . sodium chloride flush  10-40 mL  Intracatheter Q12H   Continuous: . sodium chloride 10 mL/hr at 04/10/18 0600  . feeding supplement (JEVITY 1.2 CAL) 60 mL/hr at 04/09/18 1900    LTM EEG report for this morning (9/28): Clinical interpretation: This intensive EEG monitoring with simultaneous video monitoring did not record any clinical subclinical seizures.  Background activities were abnormal for several reasons #1 mild background activity slowing and disorganization suggestive of mild encephalopathy of  nonspecific etiologies.  #2 persistent left posterior temporal slowing with admixed high amplitude faster frequencies carrying  irregular polymorphic sharply contoured morphology suggestive of neuronal dysfunction on a structural vascular or degenerative basis in that region.  This also could be related to breach rhythm  in appropriate clinical setting.  Clinical correlation is advised.   Assessment: 62 y.o. Female with PMH of stage 4 breast cancer on chemo with XRT to brain met, 5 cerebral aneurysms with treatment for 3. Originally admitted as a transfer from [facility] for [facility]. Neurology consulted for potential seizures on routine EEG.  1. Currently on scheduled Dilantin 100 mg TID and Keppra 1500 mg BID. 2. Initial EEG showed sharp activity and right temporal slowing. LTM EEG overnight did not record any electrographic seizures.   3. Given the LTM EEG results, her episodes of hand movement are felt most likely to be stereotyped movements that are non-epileptic in origin. However, given sharp acitivity and slowing, which are suggestive of epileptogenic potential arising from the right temporal lobe  Recommendations: -Discontinue LTM EEG -Correct metabolic derangements (primary team) -Continue Keppra and Dilantin at current dosage regimens -Clinical improvement regarding hand-waving stereotyped episodes, which may have been seizures, is felt relatively likely to be due to addition of phenytoin to her regimen -Dilantin level is pending. If elevated or subtherapeutic, call pharmacy for dosing instructions -Neurology will sign off. Please call if there are additional questions.   35 minutes spent in the bedside evaluation and management of this critically ill patient. Time spent included education of family regarding prognosis and pathophysiology of intracerebral blood with regard to mental status changes.    LOS: 19 days   '@Electronically'  signed: Dr. Kerney Elbe 04/10/2018  10:00 AM

## 2018-04-10 NOTE — Progress Notes (Signed)
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  Patient was intubated in the ED for airway protection, amd subsequently started on nimodipine.  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  Patient successfully extubated on 9/21.  Working with PT recommending CIR however not candidate.  Palliative care consulted for goals of care.  Significant Events: 9/09 admit  9/10 cerebral angiogram with coiling 9/11 extubated > reintubated due to stridor and started steroids 9/13 wheezing, respiratory acidosis > paralytic x 1, deep sedation. Started on nimbex and solumedrol 9/14 on nimbex due to vent dynschorny. On fent, versed and diprivan. On Cardene for goal sbp <160.  On tube feeds. 9/16 remained paralyzed, diuresed, attempt to get paralytics off  9/17 diuresed very well overnight but Na increased, tolerated nimbex off, minimizing sedation  9/20 diuresed well and beginning active weaning 9/21 Net negative 8 L, weaning well > Extubated 9/22 failed SLP, off precedex 9/23 MBS > dys 1 diet; urinary retention  Subjective: Pt removed her own Cortrak this AM.  It was replaced today at 9:45AM. She remains altered and agitated w/ no significant improvement per family at bedside.  She is not able to provide a hx/ROS. Family reports what appears to be recurrent thrush. RN reports ongoing watery diarrhea and the discovery of a rectal ulcer when placing/removing a flexiseal. There is no evidence of resp distress.     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 9/10 Care per NS  Acute idiopathic  encephalopathy Reportedly pt had been steadily improving post-op until 9/25 when family noted an acute change to her current MS - continuous EEG has not revealed seizure activity - CTa has ruled out new ICH - ammonia, cortisol, B12, and folate all normal - Na remains elevated but doubt this alone explains her AMS - cont supportive care - increase QHS seroquel - correct Na   Hypernatremia  Na is now climbing again - resume IV free water and follow trend - goal is for Na to drop by no more than 63mq over the next 24hrs - recheck BMET this afternoon  Acute respiratory failure with hypoxia & hypercapnia resolved- extubated 9/21  Postextubation stridor 9/11 Resolved   Diarrhea No clinical features to suggest C diff - pt was actively undergoing chemo tx prior to admit - suspect this is chemo related  Rectal ulceration  Suspect due to chemo - WOC consulted to eval   Macrocytic anemia  BN56and folic acid normal - likely due to chemotherapy   Aspiration PNA SLP mild aspiration risk - treated with Unasyn for aspiration pneumonia - currently not an issue as mental status will not allow oral intake    Hypokalemia Mg is normal - improving - cont to replace to goal of 4.0  Dysphagia SLP recommending dysphagia 1 diet  Deconditioning  Acute urinary retention Foley was reinserted 9/24 due to urinary retention  Stage 4 breast cancer s/p mastectomy, XRT to brain met, and recently on chemo Chemo induced anemia and thrombocytopenia - will need outpatient Oncology follow-up  DVT prophylaxis: SCDs Code Status: FULL CODE Family Communication: no family present at time  of exam  Disposition Plan:   Consultants:   Neurosurgery  PCCM  PMR  Palliative care  Antimicrobials:  None presently    Objective: Blood pressure 139/64, pulse 79, temperature 99.4 F (37.4 C), temperature source Axillary, resp. rate 14, height _0  (1.626 m), weight 93.9 kg, last menstrual period 08/14/2010,  SpO2 98 %.  Intake/Output Summary (Last 24 hours) at 04/10/2018 1145 Last data filed at 04/10/2018 1100 Gross per 24 hour  Intake 2352.82 ml  Output 1575 ml  Net 777.82 ml   Filed Weights   03/23/18 0404 04/09/18 2000 04/10/18 0426  Weight: 102 kg 95.5 kg 93.9 kg    Examination: General: No acute respiratory distress - agitated/confused  Lungs: Clear to auscultation bilaterally - no wheezing  Cardiovascular: RRR - no M or rub  Abdomen: Nondistended, soft, BS+, no rebound, no mass Extremities: No signif edema B LE   CBC: Recent Labs  Lab 04/08/18 0900 04/09/18 0500 04/10/18 0434  WBC 8.1 7.8 8.5  NEUTROABS  --  4.9  --   HGB 8.1* 7.6* 8.5*  HCT 29.0* 27.3* 29.9*  MCV 108.6* 109.2* 109.1*  PLT 305 255 412   Basic Metabolic Panel: Recent Labs  Lab 04/06/18 0513 04/07/18 0448 04/08/18 0900 04/09/18 0500 04/10/18 0434  NA 150* 155* 159* 150* 156*  K 2.9* 3.0* 2.8* 2.8* 3.5  CL 114* 121* 129* 121* 127*  CO2 _1 21* 22  GLUCOSE 156* 129* 175* 285* 156*  BUN _2 CREATININE 0.79 0.76 0.88 0.78 0.75  CALCIUM 8.8* 8.7* 8.6* 8.1* 8.7*  MG  --  2.2 2.3 2.1  --   PHOS 3.1 3.1  --   --   --    GFR: Estimated Creatinine Clearance: 83.1 mL/min (by C-G formula based on SCr of 0.75 mg/dL).  Liver Function Tests: Recent Labs  Lab 04/06/18 0513 04/07/18 0448 04/10/18 0434  AST  --   --  23  ALT  --   --  25  ALKPHOS  --   --  60  BILITOT  --   --  0.5  PROT  --   --  6.2*  ALBUMIN 2.7* 2.7* 2.8*    CBG: Recent Labs  Lab 04/09/18 1646 04/09/18 1940 04/09/18 2320 04/10/18 0351 04/10/18 0753  GLUCAP 103* 130* 76 122* 113*    Recent Results (from the past 240 hour(s))  Culture, blood (routine x 2)     Status: None   Collection Time: 04/02/18 10:26 AM  Result Value Ref Range Status   Specimen Description BLOOD FOOT RIGHT  Final   Special Requests   Final    BOTTLES DRAWN AEROBIC ONLY Blood Culture adequate volume   Culture   Final    NO  GROWTH 5 DAYS Performed at [facility], [place] [address] [place], [place] [address]    Report Status 04/07/2018 FINAL  Final  Culture, blood (routine x 2)     Status: None   Collection Time: 04/02/18 10:32 AM  Result Value Ref Range Status   Specimen Description BLOOD FOOT LEFT  Final   Special Requests   Final    BOTTLES DRAWN AEROBIC ONLY Blood Culture adequate volume   Culture   Final    NO GROWTH 5 DAYS Performed at [facility], [place] [address], [place], [place] [address]    Report Status 04/07/2018 FINAL  Final  Culture, respiratory (non-expectorated)     Status: None  Collection Time: 04/02/18 11:18 AM  Result Value Ref Range Status   Specimen Description TRACHEAL ASPIRATE  Final   Special Requests NONE  Final   Gram Stain   Final    MODERATE WBC PRESENT, PREDOMINANTLY PMN MODERATE GRAM POSITIVE COCCI MODERATE GRAM POSITIVE RODS    Culture   Final    Consistent with normal respiratory flora. Performed at [facility], [place] [address] [place], [place] [address]    Report Status 04/04/2018 FINAL  Final  Culture, Urine     Status: None   Collection Time: 04/02/18 12:48 PM  Result Value Ref Range Status   Specimen Description URINE, CATHETERIZED  Final   Special Requests NONE  Final   Culture   Final    NO GROWTH Performed at [facility], [place] [address], [place], [place] [address]    Report Status 04/03/2018 FINAL  Final  Urine Culture     Status: None   Collection Time: 04/07/18  8:00 PM  Result Value Ref Range Status   Specimen Description URINE, CATHETERIZED  Final   Special Requests NONE  Final   Culture   Final    NO GROWTH Performed at [facility], [place] [address] [place], [place] [address]    Report Status 04/09/2018 FINAL  Final     Scheduled Meds: . amLODipine  10 mg Per Tube Daily  . bethanechol  10 mg Per Tube TID  . carvedilol  25 mg Per Tube BID WC  . chlorhexidine gluconate (MEDLINE KIT)  15 mL Mouth Rinse BID   . Chlorhexidine Gluconate Cloth  6 each Topical Daily  . Chlorhexidine Gluconate Cloth  6 each Topical Daily  . enoxaparin (LOVENOX) injection  30 mg Subcutaneous Q24H  . famotidine  20 mg Per Tube Daily  . feeding supplement (PRO-STAT SUGAR FREE 64)  30 mL Per Tube Daily  . free water  200 mL Per Tube Q8H  . insulin aspart  0-15 Units Subcutaneous Q4H  . labetalol  100 mg Per Tube TID  . levETIRAcetam  1,500 mg Per Tube BID  . niMODipine  60 mg Oral Q4H   Or  . niMODipine  60 mg Per Tube Q4H  . phenytoin  100 mg Per Tube TID  . QUEtiapine  12.5 mg Oral QHS  . sodium chloride flush  10-40 mL Intracatheter Q12H   Continuous Infusions: . sodium chloride 10 mL/hr at 04/10/18 1100  . feeding supplement (JEVITY 1.2 CAL) 60 mL/hr at 04/10/18 1000     LOS: 19 days   Cherene Altes, MD [facility] Office  [phone] Pager - Text Page per Amion  If 7PM-7AM, please contact night-coverage per Amion 04/10/2018, 11:45 AM

## 2018-04-10 NOTE — Progress Notes (Signed)
  NEUROSURGERY PROGRESS NOTE   No issues overnight. Cont to be somnolent.  EXAM:  BP (!) 148/67   Pulse 76   Temp 99.2 F (37.3 C) (Axillary)   Resp 14   Ht 5\' 4"  (1.626 m)   Wt 93.9 kg   LMP 08/14/2010 (Exact Date)   SpO2 98%   BMI 35.53 kg/m   Somnolent but arouses Very hoarse, but does answer simple questions FC in all extremities, generalized weakness  IMPRESSION:  62 y.o. female s/p SAH, has been somnolent with episodic stereotypical movements. Initial spot EEG suggested sz. Longer term EEG in place  PLAN: - Will await interpretation of EEG thus far regarding subclinical SZ / status. - Cont current AEDs

## 2018-04-10 NOTE — Progress Notes (Signed)
Wound consult ordered by day shift RN for wound on patient's anus. RN will continue to monitor.

## 2018-04-10 NOTE — Progress Notes (Signed)
EEG leads removed. No skin break down.

## 2018-04-10 NOTE — Procedures (Addendum)
Electroencephalography report.  Long-term monitoring.  Recording begins 04/09/2018 at 1523 Recording ends 04/10/2018 at 07 30  Day 1 CPT 95951   This EEG was requested for this patient to rule out clinical and subclinical electrographic seizures  Background activities marked by disorganization background activity slowing with 7 cps posterior dominant rhythm  Persistent right posterior temporal slowing with sharply contoured high amplitude waves  persists throughout the recording.  No seizures.  Clinical interpretation: This intensive EEG monitoring with simultaneous video monitoring did not record any clinical subclinical seizures.  Background activities were abnormal for several reasons #1 mild background activity slowing and disorganization suggestive of mild encephalopathy of nonspecific etiologies.  #2 persistent left posterior temporal slowing with admixed high amplitude faster frequencies carrying  irregular polymorphic sharply contoured morphology suggestive of neuronal dysfunction on a structural vascular or degenerative basis in that region.  This also could be related to breach rhythm  in appropriate clinical setting.  Clinical correlation is advised.

## 2018-04-11 DIAGNOSIS — L8990 Pressure ulcer of unspecified site, unspecified stage: Secondary | ICD-10-CM

## 2018-04-11 LAB — CBC
HEMATOCRIT: 29.7 % — AB (ref 36.0–46.0)
Hemoglobin: 8.3 g/dL — ABNORMAL LOW (ref 12.0–15.0)
MCH: 30.5 pg (ref 26.0–34.0)
MCHC: 27.9 g/dL — ABNORMAL LOW (ref 30.0–36.0)
MCV: 109.2 fL — AB (ref 78.0–100.0)
PLATELETS: 208 10*3/uL (ref 150–400)
RBC: 2.72 MIL/uL — AB (ref 3.87–5.11)
RDW: 20.4 % — ABNORMAL HIGH (ref 11.5–15.5)
WBC: 10.5 10*3/uL (ref 4.0–10.5)

## 2018-04-11 LAB — BASIC METABOLIC PANEL
Anion gap: 6 (ref 5–15)
BUN: 10 mg/dL (ref 6–20)
CHLORIDE: 122 mmol/L — AB (ref 98–111)
CO2: 21 mmol/L — ABNORMAL LOW (ref 22–32)
CREATININE: 0.68 mg/dL (ref 0.44–1.00)
Calcium: 8.3 mg/dL — ABNORMAL LOW (ref 8.9–10.3)
GFR calc non Af Amer: >60 mL/min (ref >60)
Glucose, Bld: 147 mg/dL — ABNORMAL HIGH (ref 70–99)
POTASSIUM: 3.6 mmol/L (ref 3.5–5.1)
SODIUM: 149 mmol/L — AB (ref 135–145)

## 2018-04-11 LAB — GLUCOSE, CAPILLARY
GLUCOSE-CAPILLARY: 156 mg/dL — AB (ref 70–99)
GLUCOSE-CAPILLARY: 93 mg/dL (ref 70–99)
Glucose-Capillary: 116 mg/dL — ABNORMAL HIGH (ref 70–99)
Glucose-Capillary: 127 mg/dL — ABNORMAL HIGH (ref 70–99)
Glucose-Capillary: 138 mg/dL — ABNORMAL HIGH (ref 70–99)
Glucose-Capillary: 87 mg/dL (ref 70–99)
Glucose-Capillary: 89 mg/dL (ref 70–99)

## 2018-04-11 MED ORDER — LOPERAMIDE HCL 1 MG/7.5ML PO SUSP
2.0000 mg | Freq: Four times a day (QID) | ORAL | Status: DC
  Administered 2018-04-11 – 2018-04-16 (×18): 2 mg
  Filled 2018-04-11 (×22): qty 15

## 2018-04-11 NOTE — Progress Notes (Signed)
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  Patient was intubated in the ED for airway protection, and subsequently started on nimodipine.  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 Patient successfully extubated on 9/21.   Significant Events: 9/09 admit  9/10 cerebral angiogram with coiling 9/11 extubated > reintubated due to stridor and started steroids 9/13 wheezing, respiratory acidosis > paralytic x 1, deep sedation. Started on nimbex and solumedrol 9/14 on nimbex due to vent dynschorny. On fent, versed and diprivan. On Cardene for goal sbp <160.  On tube feeds. 9/16 remained paralyzed, diuresed, attempt to get paralytics off  9/17 diuresed very well overnight but Na increased, tolerated nimbex off, minimizing sedation  9/20 diuresed well and beginning active weaning 9/21 Net negative 8 L, weaning well > Extubated 9/22 failed SLP, off precedex 9/23 MBS > dys 1 diet; urinary retention  Subjective: The pt appears ever so slightly more alert today, but not significantly so. She is not able to communicate w/ me, and does not follow simple commands.  There is no evidence of acute resp distress or uncontrolled pain. She does appear to have a signif amount of upper airway secretions.   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 9/10 Care per NS  Acute idiopathic encephalopathy Reportedly pt had been steadily improving post-op until 9/25 when family noted an acute change to her current MS - continuous EEG has not revealed seizure activity - CTa has ruled out new ICH - ammonia,  cortisol, B12, and folate all normal - Na remains elevated but doubt this alone explains her AMS, and it is now improving - cont supportive care - increase QHS seroquel - cont to correct Na   Hypernatremia  Na is improving at desired rate - cont free water and monitor   Acute respiratory failure with hypoxia & hypercapnia resolved- extubated 9/21  Postextubation stridor 9/11 Resolved   Diarrhea No clinical features to suggest C diff - pt was actively undergoing chemo tx prior to admit - suspect this is chemo related - continue scheduled imodium   Rectal ulceration  Suspect due to chemo - WOC eval noted - will need more thorough eval once pt more stable   Macrocytic anemia  W11 and folic acid normal - likely due to chemotherapy   Aspiration PNA SLP mild aspiration risk - treated with Unasyn for aspiration pneumonia - currently not an issue as mental status will not allow oral intake   Dysphagia SLP recommending dysphagia 1 diet  Deconditioning  Acute urinary retention Foley was reinserted 9/24 due to urinary retention  Stage 4 breast cancer s/p mastectomy, XRT to brain met, and recently on chemo Chemo induced anemia and thrombocytopenia - will need outpatient Oncology follow-up  DVT prophylaxis: SCDs Code Status: FULL CODE Family Communication: no family present at time of exam today  Disposition Plan:   Consultants:   Neurosurgery  PCCM  PMR  Palliative care  Antimicrobials:  None presently    Objective: Blood pressure 104/74, pulse 78, temperature 98.4 F (36.9 C), temperature source Axillary, resp. rate (!) 26, height '5\' 4"'  (1.626 m), weight  94.1 kg, last menstrual period 08/14/2010, SpO2 100 %.  Intake/Output Summary (Last 24 hours) at 04/11/2018 1358 Last data filed at 04/11/2018 1000 Gross per 24 hour  Intake 3146.71 ml  Output 740 ml  Net 2406.71 ml   Filed Weights   04/09/18 2000 04/10/18 0426 04/11/18 0600  Weight: 95.5 kg 93.9 kg 94.1 kg     Examination: General: No acute respiratory distress Lungs: Course upper airway sounds transmitted th/o - no wheezing  Cardiovascular: RRR w/o M  Abdomen: Nondistended, soft, BS+, no rebound, no mass Extremities: No signif edema B lower extremities   CBC: Recent Labs  Lab 04/09/18 0500 04/10/18 0434 04/11/18 0419  WBC 7.8 8.5 10.5  NEUTROABS 4.9  --   --   HGB 7.6* 8.5* 8.3*  HCT 27.3* 29.9* 29.7*  MCV 109.2* 109.1* 109.2*  PLT 255 224 295   Basic Metabolic Panel: Recent Labs  Lab 04/06/18 0513 04/07/18 0448 04/08/18 0900 04/09/18 0500 04/10/18 0434 04/10/18 1553 04/11/18 0419  NA 150* 155* 159* 150* 156* 153* 149*  K 2.9* 3.0* 2.8* 2.8* 3.5 3.7 3.6  CL 114* 121* 129* 121* 127* 127* 122*  CO2 '24 22 22 ' 21* 22 20* 21*  GLUCOSE 156* 129* 175* 285* 156* 112* 147*  BUN '14 14 14 13 13 13 10  ' CREATININE 0.79 0.76 0.88 0.78 0.75 0.69 0.68  CALCIUM 8.8* 8.7* 8.6* 8.1* 8.7* 8.4* 8.3*  MG  --  2.2 2.3 2.1  --   --   --   PHOS 3.1 3.1  --   --   --   --   --    GFR: Estimated Creatinine Clearance: 83.2 mL/min (by C-G formula based on SCr of 0.68 mg/dL).  Liver Function Tests: Recent Labs  Lab 04/06/18 0513 04/07/18 0448 04/10/18 0434  AST  --   --  23  ALT  --   --  25  ALKPHOS  --   --  60  BILITOT  --   --  0.5  PROT  --   --  6.2*  ALBUMIN 2.7* 2.7* 2.8*    CBG: Recent Labs  Lab 04/10/18 1942 04/11/18 0012 04/11/18 0343 04/11/18 0740 04/11/18 1124  GLUCAP 136* 89 127* 116* 156*    Recent Results (from the past 240 hour(s))  Culture, blood (routine x 2)     Status: None   Collection Time: 04/02/18 10:26 AM  Result Value Ref Range Status   Specimen Description BLOOD FOOT RIGHT  Final   Special Requests   Final    BOTTLES DRAWN AEROBIC ONLY Blood Culture adequate volume   Culture   Final    NO GROWTH 5 DAYS Performed at [facility], [place] [address], [place], [place] [address]    Report Status 04/07/2018 FINAL  Final  Culture, blood  (routine x 2)     Status: None   Collection Time: 04/02/18 10:32 AM  Result Value Ref Range Status   Specimen Description BLOOD FOOT LEFT  Final   Special Requests   Final    BOTTLES DRAWN AEROBIC ONLY Blood Culture adequate volume   Culture   Final    NO GROWTH 5 DAYS Performed at [facility], [place] [address] [place], [place] [address]    Report Status 04/07/2018 FINAL  Final  Culture, respiratory (non-expectorated)     Status: None   Collection Time: 04/02/18 11:18 AM  Result Value Ref Range Status   Specimen Description TRACHEAL ASPIRATE  Final   Special Requests NONE  Final   Gram Stain   Final    MODERATE WBC PRESENT, PREDOMINANTLY PMN MODERATE GRAM POSITIVE COCCI MODERATE GRAM POSITIVE RODS    Culture   Final    Consistent with normal respiratory flora. Performed at [facility], [place] [address] [place], [place] [address]    Report Status 04/04/2018 FINAL  Final  Culture, Urine     Status: None   Collection Time: 04/02/18 12:48 PM  Result Value Ref Range Status   Specimen Description URINE, CATHETERIZED  Final   Special Requests NONE  Final   Culture   Final    NO GROWTH Performed at [facility], [place] [address], [place], [place] [address]    Report Status 04/03/2018 FINAL  Final  Urine Culture     Status: None   Collection Time: 04/07/18  8:00 PM  Result Value Ref Range Status   Specimen Description URINE, CATHETERIZED  Final   Special Requests NONE  Final   Culture   Final    NO GROWTH Performed at [facility], [place] [address] [place], [place] [address]    Report Status 04/09/2018 FINAL  Final     Scheduled Meds: . amLODipine  10 mg Per Tube Daily  . carvedilol  25 mg Per Tube BID WC  . chlorhexidine gluconate (MEDLINE KIT)  15 mL Mouth Rinse BID  . Chlorhexidine Gluconate Cloth  6 each Topical Daily  . enoxaparin (LOVENOX) injection  30 mg Subcutaneous Q24H  . famotidine  20 mg Per Tube Daily  . feeding supplement (PRO-STAT  SUGAR FREE 64)  30 mL Per Tube Daily  . fluconazole  100 mg Per Tube Daily  . free water  200 mL Per Tube Q8H  . insulin aspart  0-15 Units Subcutaneous Q4H  . labetalol  100 mg Per Tube TID  . levETIRAcetam  1,500 mg Per Tube BID  . niMODipine  60 mg Oral Q4H   Or  . niMODipine  60 mg Per Tube Q4H  . phenytoin  100 mg Per Tube TID  . QUEtiapine  25 mg Oral QHS  . sodium chloride flush  10-40 mL Intracatheter Q12H   Continuous Infusions: . sodium chloride Stopped (04/10/18 1537)  . dextrose 5 % with KCl 20 mEq / L 60 mL/hr at 04/11/18 1000  . feeding supplement (JEVITY 1.2 CAL) 60 mL/hr at 04/11/18 0055     LOS: 20 days   Cherene Altes, MD [facility] Office  [phone] Pager - Text Page per Amion  If 7PM-7AM, please contact night-coverage per Amion 04/11/2018, 1:58 PM

## 2018-04-11 NOTE — Consult Note (Signed)
[facility] Nurse wound consult note Reason for Consult: Patient with complex medical history consisting of Stage 4 Breast cancer on XRT with metastasis to brain, cerebral aneurysms and new onset potential seizures. Bedside RN noticed today while applying a rectal pouch for the containment of liquid stool that there is a full thickness perirectal wound with yellow wound base. At this time we are unsure if it is a medical device related pressure injury sustained from the indwelling bowel management system (BMS), FlexiSeal or a wound with different etiology (eg neoplasm) or moisture associated skin damage stemming from fecal incontinence of liquid effluent. Wound type: See above Pressure Injury POA: NA Measurement:Not seen today as fecal pouching system recently placed is intact Wound HKG:OVPCHEK RN describes primarily yellow wound bed with soft center and depth not known Drainage (amount, consistency, odor) unable to determine due to liquid stools/light brown in color Periwound:erythema to the perineum consistent with moisture associated skin damage (MASD), specifically incontinence associated dermatitis (IAD) Dressing procedure/placement/frequency: Regardless of wound etiology, containment of liquid stool is the goal of care at this time.  Ideally, the next time pouch is off it would be helpful if CCS MD or PA could be paged to assess the area. If you agree, please consider this avenue as it is prudent to rule out a neoplasm in the differential diagnosis-given this patient's history.  [facility] nursing team will not follow, but will remain available to this patient, the nursing and medical teams.  Please re-consult if needed. Thanks, Maudie Flakes, MSN, RN, [place], Arther Abbott  Pager# [phone]

## 2018-04-11 NOTE — Progress Notes (Signed)
  NEUROSURGERY PROGRESS NOTE   No issues overnight.   EXAM:  BP (!) 157/79 (BP Location: Left Leg)   Pulse 88   Temp 98.5 F (36.9 C) (Axillary)   Resp (!) 29   Ht 5\' 4"  (1.626 m)   Wt 94.1 kg   LMP 08/14/2010 (Exact Date)   SpO2 100%   BMI 35.61 kg/m   Drowsy but arouses Can respond to simple questions but unable to speak with any volume Moves all extremities spontaneously and to command  Long-term EEG reviewed by neurology, neg for ongoing SZ/subclinical status  IMPRESSION:  62 y.o. female s/p SAH, has become very drowsy, encephalopathic of unclear etiology. Does not appear to be in status. Neurology feels the stereotyped movements might be related to dilantin Electrolyte profile largely unremarkable Anemia but this shouldn't be the cause of her encephalopathy Had CTA a few days ago, did not demonstrate any spasm, hemorrhage or structural abnormality to explain her condition  PLAN: - Will check free dilantin  - May consider MRI

## 2018-04-12 ENCOUNTER — Inpatient Hospital Stay (HOSPITAL_COMMUNITY): Payer: Medicaid Other

## 2018-04-12 DIAGNOSIS — R410 Disorientation, unspecified: Secondary | ICD-10-CM

## 2018-04-12 LAB — POCT I-STAT 3, ART BLOOD GAS (G3+)
Acid-base deficit: 4 mmol/L — ABNORMAL HIGH (ref 0.0–2.0)
Bicarbonate: 19.7 mmol/L — ABNORMAL LOW (ref 20.0–28.0)
O2 Saturation: 97 %
PCO2 ART: 31.3 mmHg — AB (ref 32.0–48.0)
Patient temperature: 99.3
TCO2: 21 mmol/L — ABNORMAL LOW (ref 22–32)
pH, Arterial: 7.409 (ref 7.350–7.450)
pO2, Arterial: 87 mmHg (ref 83.0–108.0)

## 2018-04-12 LAB — BASIC METABOLIC PANEL
ANION GAP: 5 (ref 5–15)
BUN: 9 mg/dL (ref 6–20)
CALCIUM: 8 mg/dL — AB (ref 8.9–10.3)
CO2: 20 mmol/L — AB (ref 22–32)
Chloride: 111 mmol/L (ref 98–111)
Creatinine, Ser: 0.66 mg/dL (ref 0.44–1.00)
GFR calc non Af Amer: >60 mL/min (ref >60)
Glucose, Bld: 221 mg/dL — ABNORMAL HIGH (ref 70–99)
POTASSIUM: 4.1 mmol/L (ref 3.5–5.1)
Sodium: 136 mmol/L (ref 135–145)

## 2018-04-12 LAB — GLUCOSE, CAPILLARY
GLUCOSE-CAPILLARY: 126 mg/dL — AB (ref 70–99)
Glucose-Capillary: 124 mg/dL — ABNORMAL HIGH (ref 70–99)
Glucose-Capillary: 123 mg/dL — ABNORMAL HIGH (ref 70–99)
Glucose-Capillary: 118 mg/dL — ABNORMAL HIGH (ref 70–99)
Glucose-Capillary: 107 mg/dL — ABNORMAL HIGH (ref 70–99)
Glucose-Capillary: 92 mg/dL (ref 70–99)

## 2018-04-12 LAB — VITAMIN D 25 HYDROXY (VIT D DEFICIENCY, FRACTURES): Vit D, 25-Hydroxy: 15.1 ng/mL — ABNORMAL LOW (ref 30.0–100.0)

## 2018-04-12 MED ORDER — CHOLECALCIFEROL 400 UNIT/ML PO LIQD
800.0000 [IU] | Freq: Every day | ORAL | Status: DC
  Administered 2018-04-12 – 2018-04-16 (×5): 800 [IU]
  Filled 2018-04-12 (×6): qty 2

## 2018-04-12 MED ORDER — GERHARDT'S BUTT CREAM
TOPICAL_CREAM | Freq: Four times a day (QID) | CUTANEOUS | Status: DC
  Administered 2018-04-12 – 2018-04-14 (×12): via TOPICAL
  Administered 2018-04-15: 1 via TOPICAL
  Administered 2018-04-15 – 2018-04-16 (×5): via TOPICAL
  Filled 2018-04-12: qty 1

## 2018-04-12 NOTE — Consult Note (Signed)
[facility] Nurse wound consult note While seeing patients in 4N ICU, I was asked by the patient's primary RN to look at the anus.  My colleague, L. McNichol, WOC, attempted to see the area yesterday, but it was obstructed from view by an intact fecal containment pouch.  Today the pouch is off.  With the assistance of the primary RN and other RNs, I was able to view the area.   Reason for Consult: Perianal wound Wound type: unknown etiology From the external view of the area, there is an opening that originates at the verge of the anus and perineal tissues.  It extends towards the vagina.  From the external view I cannot know if there is any type of internal communication with the anus and vagina, such as an anovaginal fistula.  I have requested the primary RN to contact her attending.  There is nothing that the [facility] team can order to resolve this issue.  The patient's skin on the buttocks, labia, and posterior upper thighs is erythematous and suffering the impact of repeated exposure to liquid feces, copious amounts at that.  For this I will order Gerhardt's butt cream with each cleansing of incontinence. Monitor the wound area(s) for worsening of condition such as: Signs/symptoms of infection,  Increase in size,  Development of or worsening of odor, Development of pain, or increased pain at the affected locations.  Notify the medical team if any of these develop.  Thank you for the consult.  Discussed plan of care with the patient and bedside nurse.  [facility] nurse will not follow at this time.  Please re-consult the [facility] team if needed.  Val Riles, RN, MSN, CWOCN, CNS-BC, pager [phone]

## 2018-04-12 NOTE — Progress Notes (Signed)
[facility] TEAM 1 - Stepdown/ICU TEAM  Diane Short  VQM:[id number] DOB: 02/14/1956 DOA: 03/22/2018 PCP: Burnard Bunting, MD    Brief Narrative:  62 year old female with a history of cerebral aneurysm status post intervention who presented with persistent headache and altered mental status, and was found to have Hunt Hess grade 4 SAH.  Patient was intubated in the ED for airway protection, and subsequently started on nimodipine.  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 Patient successfully extubated on 9/21.   Significant Events: 9/09 admit  9/10 cerebral angiogram with coiling 9/11 extubated > reintubated due to stridor and started steroids 9/13 wheezing, respiratory acidosis > paralytic x 1, deep sedation. Started on nimbex and solumedrol 9/14 on nimbex due to vent dynschorny. On fent, versed and diprivan. On Cardene for goal sbp <160.  On tube feeds. 9/16 remained paralyzed, diuresed, attempt to get paralytics off  9/17 diuresed very well overnight but Na increased, tolerated nimbex off, minimizing sedation  9/20 diuresed well and beginning active weaning 9/21 Net negative 8 L, weaning well > Extubated 9/22 failed SLP, off precedex 9/23 MBS > dys 1 diet; urinary retention  Subjective: Again the pt appears to me to be making progress in regard to her level of alertness, albeit very slow. She is in no apparent resp distress or uncontrolled pain. NS and Neurology continue to follow.   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 9/10 Care per NS  Acute idiopathic encephalopathy Reportedly pt had been steadily improving post-op until 9/25 when family noted an acute change to her current MS - continuous EEG has not revealed seizure activity - CTa has ruled out new ICH - ammonia, cortisol, B12, and folate all normal - Na is now normal - cont supportive care - NS has  ordered an MRI for today   Signif Vitamin D deficiency  Begin replacement via feeding tube   Hypernatremia  Na is now normal - greatly decrease free water admin so as not to overshoot   Acute respiratory failure with hypoxia & hypercapnia resolved- extubated 9/21  Postextubation stridor 9/11 Resolved   Diarrhea No clinical features to suggest C diff - pt was actively undergoing chemo tx prior to admit - suspect this is chemo related - continue scheduled imodium   Rectal ulceration  Suspect due to chemo - WOC eval noted - will need more thorough eval once pt more stable   Macrocytic anemia  D32 and folic acid normal - likely due to chemotherapy   Aspiration PNA SLP mild aspiration risk - treated with Unasyn for aspiration pneumonia - currently not an issue as mental status will not allow oral intake   Dysphagia SLP recommending dysphagia 1 diet  Deconditioning  Acute urinary retention Foley was reinserted 9/24 due to urinary retention  Stage 4 breast cancer s/p mastectomy, XRT to brain met, and recently on chemo Chemo induced anemia and thrombocytopenia - will need outpatient Oncology follow-up  DVT prophylaxis: SCDs Code Status: FULL CODE Family Communication: no family present at time of exam today  Disposition Plan: SDU  Consultants:   Neurosurgery  PCCM  PMR  Palliative care  Antimicrobials:  None presently    Objective: Blood pressure (!) 158/82, pulse 88, temperature 99.3 F (37.4 C), temperature source Axillary, resp. rate (!) 29, height _0  (1.626 m), weight 95.1 kg, last menstrual period 08/14/2010, SpO2 100 %.  Intake/Output  Summary (Last 24 hours) at 04/12/2018 1559 Last data filed at 04/12/2018 1500 Gross per 24 hour  Intake 3228.13 ml  Output 1890 ml  Net 1338.13 ml   Filed Weights   04/10/18 0426 04/11/18 0600 04/12/18 0500  Weight: 93.9 kg 94.1 kg 95.1 kg    Examination: General: No acute respiratory distress - slightly more  alert  Lungs: CTA B - no wheezing  Cardiovascular: RRR - no rub or M  Abdomen: Soft, ND, BS+, no rebound, no mass Extremities: trace edema B LE   CBC: Recent Labs  Lab 04/09/18 0500 04/10/18 0434 04/11/18 0419  WBC 7.8 8.5 10.5  NEUTROABS 4.9  --   --   HGB 7.6* 8.5* 8.3*  HCT 27.3* 29.9* 29.7*  MCV 109.2* 109.1* 109.2*  PLT 255 224 696   Basic Metabolic Panel: Recent Labs  Lab 04/06/18 0513 04/07/18 0448 04/08/18 0900 04/09/18 0500  04/10/18 1553 04/11/18 0419 04/12/18 0620  NA 150* 155* 159* 150*   < > 153* 149* 136  K 2.9* 3.0* 2.8* 2.8*   < > 3.7 3.6 4.1  CL 114* 121* 129* 121*   < > 127* 122* 111  CO2 _0 21*   < > 20* 21* 20*  GLUCOSE 156* 129* 175* 285*   < > 112* 147* 221*  BUN _1 < > _2 CREATININE 0.79 0.76 0.88 0.78   < > 0.69 0.68 0.66  CALCIUM 8.8* 8.7* 8.6* 8.1*   < > 8.4* 8.3* 8.0*  MG  --  2.2 2.3 2.1  --   --   --   --   PHOS 3.1 3.1  --   --   --   --   --   --    < > = values in this interval not displayed.   GFR: Estimated Creatinine Clearance: 83.7 mL/min (by C-G formula based on SCr of 0.66 mg/dL).  Liver Function Tests: Recent Labs  Lab 04/06/18 0513 04/07/18 0448 04/10/18 0434  AST  --   --  23  ALT  --   --  25  ALKPHOS  --   --  60  BILITOT  --   --  0.5  PROT  --   --  6.2*  ALBUMIN 2.7* 2.7* 2.8*    CBG: Recent Labs  Lab 04/11/18 2327 04/12/18 0324 04/12/18 0734 04/12/18 1134 04/12/18 1545  GLUCAP 93 124* 123* 118* 107*    Recent Results (from the past 240 hour(s))  Urine Culture     Status: None   Collection Time: 04/07/18  8:00 PM  Result Value Ref Range Status   Specimen Description URINE, CATHETERIZED  Final   Special Requests NONE  Final   Culture   Final    NO GROWTH Performed at [facility], [place] [address] [place], [place] [address]    Report Status 04/09/2018 FINAL  Final     Scheduled Meds: . amLODipine  10 mg Per Tube Daily  . carvedilol  25 mg Per Tube BID WC  .  chlorhexidine gluconate (MEDLINE KIT)  15 mL Mouth Rinse BID  . Chlorhexidine Gluconate Cloth  6 each Topical Daily  . enoxaparin (LOVENOX) injection  30 mg Subcutaneous Q24H  . famotidine  20 mg Per Tube Daily  . feeding supplement (PRO-STAT SUGAR FREE 64)  30 mL Per Tube Daily  . fluconazole  100 mg Per Tube Daily  . free water  200 mL Per Tube Q8H  . Gerhardt's butt cream   Topical QID  . insulin aspart  0-15 Units Subcutaneous Q4H  . labetalol  100 mg Per Tube TID  . levETIRAcetam  1,500 mg Per Tube BID  . loperamide HCl  2 mg Per Tube Q6H  . phenytoin  100 mg Per Tube TID  . QUEtiapine  25 mg Oral QHS  . sodium chloride flush  10-40 mL Intracatheter Q12H     LOS: 21 days   Cherene Altes, MD [facility] Office  [phone] Pager - Text Page per Amion  If 7PM-7AM, please contact night-coverage per Amion 04/12/2018, 3:59 PM

## 2018-04-12 NOTE — Progress Notes (Signed)
Pt has extremity restrictions on the right and left arms. ABG obtained on the patient's right radial per Dr. Leonel Ramsay.

## 2018-04-12 NOTE — Progress Notes (Signed)
  NEUROSURGERY PROGRESS NOTE   No issues overnight. Family says she got a lot of rest yesterday.  EXAM:  BP 135/79   Pulse 82   Temp 99.3 F (37.4 C) (Axillary)   Resp 19   Ht 5\' 4"  (1.626 m)   Wt 95.1 kg   LMP 08/14/2010 (Exact Date)   SpO2 100%   BMI 35.99 kg/m   Awake, alert Mouths some words but difficult to understand Moves all extremities symmetrically, FC  IMPRESSION:  62 y.o. female s/p SAH, has been more encephalopathic over last week, unclear related to SZ.  PLAN: - Spoke with neurology again today, appreciate any input - If felt to be unlikely related to ongoing SZ, will get MRI.

## 2018-04-12 NOTE — Progress Notes (Signed)
Subjective: Slightly more awake than yesterday, but still altered  I reviewed the video of 1 of the events that was happening more frequently last Thursday, she has a no-no type head movement and lifts her left arm and left leg.  Exam: Vitals:   04/12/18 1500 04/12/18 1600  BP: (!) 158/82 (!) 158/82  Pulse: 88 85  Resp: (!) 29   Temp:  99.8 F (37.7 C)  SpO2: 100% 100%   Gen: In bed, NAD Resp: non-labored breathing, no acute distress Abd: soft, nt  Neuro: MS: Able to tell me her name, most of her speech is unintelligible, does follow some simple commands. CN: Was equal round reactive, tracks across midline in both directions Motor: Good strength throughout Sensory: Intact light touch  Pertinent Labs: BMP-unremarkable Phenytoin level from 9/28-9.1 Ammonia, TSH, RPR, cortisol all within normal limits  Impression: 62 year old female with subarachnoid hemorrhage and subsequent concern for seizures with persistent lethargy/encephalopathy.  I am not sure that this can be attributed to seizures given the normal overnight EEG  Recommendations: 1) continue Dilantin, Keppra 2) MRI brain 3) ABG 4) neurology to follow  Roland Rack, MD [facility] [phone]  If 7pm- 7am, please page neurology on call as listed in [place].

## 2018-04-12 NOTE — Progress Notes (Signed)
Physical Therapy Treatment Patient Details Name: Diane Short MRN: [id number] DOB: 07-10-1956 Today's Date: 04/12/2018    History of Present Illness 62 yo female hx smoking, stage 4 breast cancer with brain met tx with chemo and brain XRT, cerebral aneurysms s/p intervention presented with persistent headache, and altered mental status.  Found to have stable intraventricular hemorrhage as well as subarachnoid hemorrhage concentrated in left prepontine cistern and left CP angle cistern.  Intubated 9/9-9/11 and re-intubated 9/11-9/21. CXR 9/22 mild patchy/platelike lower lobe opacities, likely atelectasis.    PT Comments    Pt is lethargic and unfocused.  Emphasis on transitions and sitting balance at EOB.  Pt unable to follow verbal, tactile cues or hand over assist for a task like spreading lotion on her dry legs.  Pt left in bed due to scheduled for MRI to search for reasons for possible decline.   Follow Up Recommendations  Supervision/Assistance - 24 hour;SNF     Equipment Recommendations  Other (comment)(TBD)    Recommendations for Other Services       Precautions / Restrictions Precautions Precautions: Fall    Mobility  Bed Mobility Overal bed mobility: Needs Assistance Bed Mobility: Sidelying to Sit;Sit to Sidelying   Sidelying to sit: Total assist;+2 for physical assistance Supine to sit: Total assist;+2 for physical assistance   Sit to sidelying: Total assist;+2 for physical assistance    Transfers                 General transfer comment: not attempted today  Ambulation/Gait                 Stairs             Wheelchair Mobility    Modified Rankin (Stroke Patients Only)       Balance Overall balance assessment: Needs assistance   Sitting balance-Leahy Scale: Zero                                      Cognition Arousal/Alertness: Lethargic Behavior During Therapy: Flat affect Overall Cognitive Status:  Impaired/Different from baseline Area of Impairment: Orientation;Attention;Memory;Following commands;Safety/judgement;Problem solving;Awareness                 Orientation Level: Disoriented to;Place;Time;Situation Current Attention Level: Focused Memory: Decreased recall of precautions;Decreased short-term memory Following Commands: Follows one step commands inconsistently Safety/Judgement: Decreased awareness of safety;Decreased awareness of deficits Awareness: Intellectual Problem Solving: Slow processing;Decreased initiation;Requires tactile cues;Requires verbal cues;Difficulty sequencing        Exercises      General Comments General comments (skin integrity, edema, etc.): Focus continues to be limited, but now with no initiation, poor tracking and more incoordination on the left side.      Pertinent Vitals/Pain Pain Assessment: Faces Faces Pain Scale: Hurts little more Pain Location: bottom with cleaning Pain Descriptors / Indicators: Guarding;Grimacing Pain Intervention(s): Limited activity within patient's tolerance    Home Living                      Prior Function            PT Goals (current goals can now be found in the care plan section) Acute Rehab PT Goals Patient Stated Goal: to get stronger PT Goal Formulation: With patient Time For Goal Achievement: 04/20/18 Potential to Achieve Goals: Fair Progress towards PT goals: Not progressing toward goals - comment(not quite a participatory,  going to MRI)    Frequency    Min 3X/week      PT Plan Current plan remains appropriate    Co-evaluation PT/OT/SLP Co-Evaluation/Treatment: Yes Reason for Co-Treatment: Complexity of the patient's impairments (multi-system involvement) PT goals addressed during session: Mobility/safety with mobility OT goals addressed during session: ADL's and self-care      AM-PAC PT "6 Clicks" Daily Activity  Outcome Measure  Difficulty turning over in bed  (including adjusting bedclothes, sheets and blankets)?: Unable Difficulty moving from lying on back to sitting on the side of the bed? : Unable Difficulty sitting down on and standing up from a chair with arms (e.g., wheelchair, bedside commode, etc,.)?: Unable Help needed moving to and from a bed to chair (including a wheelchair)?: Total Help needed walking in hospital room?: Total Help needed climbing 3-5 steps with a railing? : Total 6 Click Score: 6    End of Session   Activity Tolerance: Patient limited by fatigue Patient left: in bed;with call bell/phone within reach;with restraints reapplied Nurse Communication: Mobility status PT Visit Diagnosis: Hemiplegia and hemiparesis;Other abnormalities of gait and mobility (R26.89);Muscle weakness (generalized) (M62.81) Hemiplegia - Right/Left: Right Hemiplegia - caused by: Nontraumatic intracerebral hemorrhage     Time: [id number] PT Time Calculation (min) (ACUTE ONLY): 33 min  Charges:  $Therapeutic Activity: 8-22 mins                     04/12/2018  Diane Short, PT Acute Rehabilitation Services [phone]  (pager) [phone]  (office)   Diane Short 04/12/2018, 5:07 PM

## 2018-04-12 NOTE — Progress Notes (Signed)
Occupational Therapy Treatment Patient Details Name: Diane Short MRN: [id number] DOB: December 18, 1955 Today's Date: 04/12/2018    History of present illness 62 yo female hx smoking, stage 4 breast cancer with brain met tx with chemo and brain XRT, cerebral aneurysms s/p intervention presented with persistent headache, and altered mental status.  Found to have stable intraventricular hemorrhage as well as subarachnoid hemorrhage concentrated in left prepontine cistern and left CP angle cistern.  Intubated 9/9-9/11 and re-intubated 9/11-9/21. CXR 9/22 mild patchy/platelike lower lobe opacities, likely atelectasis.   OT comments  Pt demonstrates a decline in function as compared to last visit, requiring total A for all ADL and total A +2 with mobility. Pt with decreased initiation, decreased ability to follow commands and appears to be hallucinating at times. Pt will benefit from rehab at SNF. Will continue to follow.   Follow Up Recommendations  SNF;Supervision/Assistance - 24 hour    Equipment Recommendations  3 in 1 bedside commode;Tub/shower bench;Hospital bed    Recommendations for Other Services      Precautions / Restrictions Precautions Precautions: Fall       Mobility Bed Mobility Overal bed mobility: Needs Assistance Bed Mobility: Sidelying to Sit;Sit to Sidelying   Sidelying to sit: Total assist;+2 for physical assistance Supine to sit: Total assist;+2 for physical assistance   Sit to sidelying: Total assist;+2 for physical assistance    Transfers                 General transfer comment: not attempted today    Balance Overall balance assessment: Needs assistance   Sitting balance-Leahy Scale: Zero                                     ADL either performed or assessed with clinical judgement   ADL                                 Toileting - Clothing Manipulation Details (indicate cue type and reason): incontinent of BM;  total A to clean; apparent moisture assisted skin damage     Functional mobility during ADLs: Total assistance;+2 for physical assistance General ADL Comments: total A all ADL     Vision   Vision Assessment?: Vision impaired- to be further tested in functional context   Perception     Praxis      Cognition Arousal/Alertness: Lethargic Behavior During Therapy: Flat affect Overall Cognitive Status: Impaired/Different from baseline Area of Impairment: Orientation;Attention;Memory;Following commands;Safety/judgement;Problem solving;Awareness                 Orientation Level: Disoriented to;Place;Time;Situation Current Attention Level: Focused Memory: Decreased recall of precautions;Decreased short-term memory Following Commands: Follows one step commands inconsistently Safety/Judgement: Decreased awareness of safety;Decreased awareness of deficits Awareness: Intellectual Problem Solving: Slow processing;Decreased initiation;Requires tactile cues;Requires verbal cues;Difficulty sequencing          Exercises     Shoulder Instructions       General Comments      Pertinent Vitals/ Pain       Pain Assessment: Faces Faces Pain Scale: Hurts little more Pain Location: bottom with cleaning Pain Descriptors / Indicators: Guarding;Grimacing Pain Intervention(s): Limited activity within patient's tolerance  Home Living  Prior Functioning/Environment              Frequency  Min 2X/week        Progress Toward Goals  OT Goals(current goals can now be found in the care plan section)  Progress towards OT goals: Progressing toward goals  Acute Rehab OT Goals Patient Stated Goal: to get stronger OT Goal Formulation: With patient/family Time For Goal Achievement: 04/20/18 Potential to Achieve Goals: Good ADL Goals Pt Will Perform Eating: with set-up;with supervision;sitting Pt Will Perform Grooming:  with min assist;sitting Pt Will Perform Upper Body Bathing: with min assist;sitting Pt Will Perform Lower Body Bathing: with mod assist;sit to/from stand Pt Will Transfer to Toilet: bedside commode;with mod assist;stand pivot transfer  Plan Discharge plan remains appropriate    Co-evaluation    PT/OT/SLP Co-Evaluation/Treatment: Yes Reason for Co-Treatment: Complexity of the patient's impairments (multi-system involvement);For patient/therapist safety   OT goals addressed during session: ADL's and self-care      AM-PAC PT "6 Clicks" Daily Activity     Outcome Measure   Help from another person eating meals?: Total Help from another person taking care of personal grooming?: Total Help from another person toileting, which includes using toliet, bedpan, or urinal?: Total Help from another person bathing (including washing, rinsing, drying)?: Total Help from another person to put on and taking off regular upper body clothing?: Total Help from another person to put on and taking off regular lower body clothing?: Total 6 Click Score: 6    End of Session    OT Visit Diagnosis: Other abnormalities of gait and mobility (R26.89);Muscle weakness (generalized) (M62.81);Apraxia (R48.2);Other symptoms and signs involving cognitive function   Activity Tolerance Patient limited by fatigue   Patient Left in bed;with call bell/phone within reach;with bed alarm set   Nurse Communication          Time: [phone] OT Time Calculation (min): 32 min  Charges: OT General Charges $OT Visit: 1 Visit OT Treatments $Self Care/Home Management : 8-22 mins  Maurie Boettcher, OT/L   Acute OT Clinical Specialist [place] Pager [phone] Office [phone]    [facility] 04/12/2018, 4:41 PM

## 2018-04-12 NOTE — Progress Notes (Signed)
SLP Cancellation Note  Patient Details Name: Diane Short MRN: [id number] DOB: 11-Oct-1955   Cancelled treatment:       Reason Eval/Treat Not Completed: Fatigue/lethargy limiting ability to participate; poor arousal despite efforts.  Pt now NPO; has cortrak.  Will continue to follow.   Juan Quam Laurice 04/12/2018, 2:20 PM

## 2018-04-13 LAB — CBC
HCT: 27.2 % — ABNORMAL LOW (ref 36.0–46.0)
Hemoglobin: 8.1 g/dL — ABNORMAL LOW (ref 12.0–15.0)
MCH: 31.2 pg (ref 26.0–34.0)
MCHC: 29.8 g/dL — AB (ref 30.0–36.0)
MCV: 104.6 fL — ABNORMAL HIGH (ref 78.0–100.0)
PLATELETS: 156 10*3/uL (ref 150–400)
RBC: 2.6 MIL/uL — AB (ref 3.87–5.11)
RDW: 20 % — AB (ref 11.5–15.5)
WBC: 10.6 10*3/uL — ABNORMAL HIGH (ref 4.0–10.5)

## 2018-04-13 LAB — COMPREHENSIVE METABOLIC PANEL
ALK PHOS: 81 U/L (ref 38–126)
ALT: 22 U/L (ref 0–44)
AST: 27 U/L (ref 15–41)
Albumin: 2.8 g/dL — ABNORMAL LOW (ref 3.5–5.0)
Anion gap: 10 (ref 5–15)
BUN: 8 mg/dL (ref 6–20)
CALCIUM: 8 mg/dL — AB (ref 8.9–10.3)
CO2: 23 mmol/L (ref 22–32)
CREATININE: 0.7 mg/dL (ref 0.44–1.00)
Chloride: 108 mmol/L (ref 98–111)
GFR calc non Af Amer: >60 mL/min (ref >60)
GLUCOSE: 129 mg/dL — AB (ref 70–99)
Potassium: 4.1 mmol/L (ref 3.5–5.1)
Sodium: 141 mmol/L (ref 135–145)
Total Bilirubin: 0.5 mg/dL (ref 0.3–1.2)
Total Protein: 5.8 g/dL — ABNORMAL LOW (ref 6.5–8.1)

## 2018-04-13 LAB — PHENYTOIN LEVEL, FREE AND TOTAL
Phenytoin, Free: 1.2 ug/mL (ref 1.0–2.0)
Phenytoin, Total: 9.4 ug/mL — ABNORMAL LOW (ref 10.0–20.0)

## 2018-04-13 LAB — GLUCOSE, CAPILLARY
GLUCOSE-CAPILLARY: 123 mg/dL — AB (ref 70–99)
GLUCOSE-CAPILLARY: 100 mg/dL — AB (ref 70–99)
GLUCOSE-CAPILLARY: 123 mg/dL — AB (ref 70–99)
Glucose-Capillary: 131 mg/dL — ABNORMAL HIGH (ref 70–99)
Glucose-Capillary: 89 mg/dL (ref 70–99)

## 2018-04-13 MED ORDER — LEVETIRACETAM 100 MG/ML PO SOLN
750.0000 mg | Freq: Two times a day (BID) | ORAL | Status: DC
  Administered 2018-04-13 – 2018-04-16 (×5): 750 mg
  Filled 2018-04-13 (×5): qty 10

## 2018-04-13 MED ORDER — CHLORHEXIDINE GLUCONATE 0.12 % MT SOLN
OROMUCOSAL | Status: AC
  Filled 2018-04-13: qty 15

## 2018-04-13 NOTE — Progress Notes (Signed)
  NEUROSURGERY PROGRESS NOTE   No issues overnight. Working with SLP during my exam.  EXAM:  BP (!) 145/75   Pulse 85   Temp 98.6 F (37 C) (Axillary)   Resp 17   Ht 5\' 4"  (1.626 m)   Wt 95.1 kg   LMP 08/14/2010 (Exact Date)   SpO2 100%   BMI 35.99 kg/m   Awake, alert,  Attempting to speak in longer sentences, answers questions but remains severely limited in phonation CN grossly intact  Moving all extremities symmetrically  IMAGING: MRI reviewed, no acute stroke, HCP or hemorrhage  IMPRESSION:  62 y.o. female s/p SAH, has been very drowsy, significant difficulty with phonation but appears somewhat improved today  PLAN: - Cont therapy - PT/OT, SLP - Will transfer to stepdown

## 2018-04-13 NOTE — Progress Notes (Signed)
Daily Progress Note   Patient Name: Diane Short       Date: 04/13/2018 DOB: 1955/12/14  Age: 62 y.o. MRN#: [id number] Attending Physician: Cherene Altes, MD Primary Care Physician: Burnard Bunting, MD Admit Date: 03/22/2018  Reason for Consultation/Follow-up: Establishing goals of care  Subjective: Patient sitting up in chair, much more alert, talks very quietly. Husband at bedside.  Length of Stay: 22  Current Medications: Scheduled Meds:  . amLODipine  10 mg Per Tube Daily  . carvedilol  25 mg Per Tube BID WC  . chlorhexidine gluconate (MEDLINE KIT)  15 mL Mouth Rinse BID  . Chlorhexidine Gluconate Cloth  6 each Topical Daily  . cholecalciferol  800 Units Per Tube Daily  . enoxaparin (LOVENOX) injection  30 mg Subcutaneous Q24H  . famotidine  20 mg Per Tube Daily  . feeding supplement (PRO-STAT SUGAR FREE 64)  30 mL Per Tube Daily  . fluconazole  100 mg Per Tube Daily  . free water  200 mL Per Tube Q8H  . Gerhardt's butt cream   Topical QID  . insulin aspart  0-15 Units Subcutaneous Q4H  . labetalol  100 mg Per Tube TID  . levETIRAcetam  750 mg Per Tube BID  . loperamide HCl  2 mg Per Tube Q6H  . phenytoin  100 mg Per Tube TID  . QUEtiapine  25 mg Oral QHS  . sodium chloride flush  10-40 mL Intracatheter Q12H    Continuous Infusions: . sodium chloride Stopped (04/10/18 1537)  . feeding supplement (JEVITY 1.2 CAL) 60 mL/hr at 04/13/18 1300    PRN Meds: sodium chloride, [DISCONTINUED] acetaminophen **OR** acetaminophen (TYLENOL) oral liquid 160 mg/5 mL **OR** [DISCONTINUED] acetaminophen, hydrALAZINE, metoprolol tartrate, ondansetron **OR** ondansetron (ZOFRAN) IV, sodium chloride flush  Physical Exam         Constitutional: She appears well-developed and  well-nourished. No distress.  HENT:  Head: Normocephalic and atraumatic.  Cardiovascular: Normal rate and regular rhythm.  Pulmonary/Chest: Effort normal and breath sounds normal. No respiratory distress.  Abdominal: Soft.  Neurological: She appears alert. Skin: Skin is warm and dry. She is not diaphoretic.   Vital Signs: BP (!) 169/88   Pulse 88   Temp 99.9 F (37.7 C) (Axillary)   Resp (!) 21   Ht '5\' 4"'  (1.626 m)   Wt 95.1 kg   LMP 08/14/2010 (Exact Date)   SpO2 100%   BMI 35.99 kg/m  SpO2: SpO2: 100 % O2 Device: O2 Device: Room Air O2 Flow Rate: O2 Flow Rate (L/min): 1 L/min  Intake/output summary:   Intake/Output Summary (Last 24 hours) at 04/13/2018 1507 Last data filed at 04/13/2018 1300 Gross per 24 hour  Intake 1471.48 ml  Output 2350 ml  Net -878.52 ml   LBM: Last BM Date: 04/12/18 Baseline Weight: Weight: 90.7 kg Most recent weight: Weight: 95.1 kg       Palliative Assessment/Data: PPS 40%    Flowsheet Rows     Most Recent Value  Intake Tab  Referral Department  Hospitalist  Unit at Time of Referral  Intermediate Care Unit  Palliative Care Primary Diagnosis  Neurology  Date Notified  04/07/18  Palliative Care Type  New Palliative care  Reason for referral  Clarify Goals of Care  Date of Admission  03/22/18  Date first seen by Palliative Care  04/08/18  # of days Palliative referral response time  1 Day(s)  # of days IP prior to Palliative referral  16  Clinical Assessment  Palliative Performance Scale Score  20%  Psychosocial & Spiritual Assessment  Palliative Care Outcomes  Patient/Family meeting held?  Yes  Who was at the meeting?  husband, daughter, 2 friends  Palliative Care Outcomes  Clarified goals of care, Provided psychosocial or spiritual support, ACP counseling assistance      Patient Active Problem List   Diagnosis Date Noted  . Pressure injury of skin 04/11/2018  . Goals of care, counseling/discussion   . Palliative care by  specialist   . Adult failure to thrive   . Endotracheal tube present   . SAH (subarachnoid hemorrhage) ([place]) 03/22/2018  . Malignant neoplasm of left breast in female, estrogen receptor negative ([place]) 01/07/2018  . Brain metastasis ([place]) 01/07/2018  . Cerebral aneurysm, nonruptured 03/13/2017    Palliative Care Assessment & Plan   HPI: 62 y.o. 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 She required intubation for airway protection. CT scan revealed SAH. 9/10 she had a cerebral angiogram with coiling. She was successfully extubated 9/21. She failed swallow study 9/22 , but after MBS on 9/23 a dys 1 diet was recommended. 9/25 and 9/26 increasingly lethargic and increasing frequency of tremors. To have repeat CT and EEG 9/26. PT recommended CIR; however, patient was deemed inappropriate as she is unable to participate in intensive therapy. PMT consulted by MD because PEG tube was recommended by physical rehab MD. PMT to assist with goals of care.  Assessment: Mental status improved. Family pleased with patient's progress and focused on rehab. Patient is receiving tube feeding through [facility] but starting to take small bites of food. Family has discussed care with neurosurgery and has good understanding of plan moving forward. All questions addressed. Patient to remain full code/full scope.  Discussed with family that PMT will be available to them as needed. They have our contact information. PMT will shadow chart. Please call if needed.  Recommendations/Plan:  Full code/full scope - family focused on rehabilitation at this point  May benefit from outpatient palliative after discharge d/t metastatic  cancer, prolonged hospitalization, and significant neurological event  Goals of Care and Additional Recommendations:  Limitations on Scope of Treatment: Full Scope Treatment  Code Status:  Full code  Prognosis:   Unable to determine  Discharge Planning:  To Be Determined  Care plan was discussed with RN, patient's spouse  Thank you for allowing the Palliative Medicine Team to assist in the care of this patient.   Total Time 20 minutes Prolonged Time Billed  no       Greater than 50%  of this time was spent counseling and coordinating care related to the above assessment and plan.  Juel Burrow, DNP, [facility] Palliative Medicine Team Team Phone # [phone]  Pager [phone]

## 2018-04-13 NOTE — Progress Notes (Signed)
CSW received psarr # [id number]. Will fax out referrals once patient feeding tube has been removed.  Thurmond Butts, [place] Social Worker [phone]

## 2018-04-13 NOTE — Care Management Note (Signed)
Case Management Note  Patient Details  Name: Anner Baity MRN: [id number] Date of Birth: 1955/11/01  Subjective/Objective:  62 yo female hx smoking, stage 4 breast cancer with brain met tx with chemo and brain XRT, cerebral aneurysms s/p intervention presented with persistent headache, and altered mental status.  Found to have stable intraventricular hemorrhage as well as subarachnoid hemorrhage concentrated in left prepontine cistern and left CP angle cistern.  PTA, pt independent, lives with spouse.                   Action/Plan: PT/OT Recommending SNF at dc; CSW consulted to facilitate dc to SNF upon medical stability.   Expected Discharge Date:                  Expected Discharge Plan:  Skilled Nursing Facility  In-House Referral:  Clinical Social Work  Discharge planning Services  CM Consult  Post Acute Care Choice:    Choice offered to:     DME Arranged:    DME Agency:     HH Arranged:    [place] Agency:     Status of Service:  In process, will continue to follow  If discussed at Long Length of Stay Meetings, dates discussed:    Additional Comments:  Reinaldo Raddle, RN, BSN  Trauma/Neuro ICU Case Manager [phone]

## 2018-04-13 NOTE — Progress Notes (Signed)
  Speech Language Pathology Treatment: Dysphagia;Cognitive-Linquistic  Patient Details Name: Diane Short MRN: [id number] DOB: 1956/03/08 Today's Date: 04/13/2018 Time: 1000-1025 SLP Time Calculation (min) (ACUTE ONLY): 25 min  Assessment / Plan / Recommendation Clinical Impression  Pt initially quite lethargic, but after repositioning, mitts removed, face washed with verbal tactile cues for attention, pt became much more alert and responsive and by the endo f session was speaking with at phrase level with 50% intelligibility with cues to increase volume and breath support. Discussed cues for chunking words/breaths with husband. Pt tolerate teaspoon bites of puree and sips of liquids with swallow appearing equal to prior presentation with brief oral hold. Pt may continue Po trials with family and RN today to combat hospital delirium. Will continue to follow to advance diet.   HPI HPI: 62 yo female hx smoking, stage 4 breast cancer with brain met tx with chemo and brain XRT, cerebral aneurysms s/p intervention presented with persistent headache, and altered mental status. Found to have stable intraventricular hemorrhage as well as subarachnoid hemorrhage concentrated in left prepontine cistern and left CP angle cistern. Intubated 9/9-9/11 and re-intubated 9/11-9/21. CXR 9/22 mild patchy/platelike lower lobe opacities, likely atelectasis. Multiple attempts at NGT unsuccessful and pt has crucial po medication.        SLP Plan  Continue with current plan of care       Recommendations  Diet recommendations: Honey-thick liquid;Dysphagia 1 (puree) Liquids provided via: Teaspoon Medication Administration: Via alternative means Supervision: Full supervision/cueing for compensatory strategies                General recommendations: Rehab consult Oral Care Recommendations: Oral care QID Follow up Recommendations: Inpatient Rehab Plan: Continue with current plan of care        GO               Herbie Baltimore, MA [place] Pager [phone] Office [phone]  Lynann Beaver 04/13/2018, 11:24 AM

## 2018-04-13 NOTE — Progress Notes (Signed)
[facility] TEAM 1 - Stepdown/ICU TEAM  Diane Short  ZOX:[id number] DOB: [birth year] DOA: 03/22/2018 PCP: Burnard Bunting, MD    Brief Narrative:  63 year old female with a history of cerebral aneurysm status post intervention who presented with persistent headache and altered mental status, and was found to have Hunt Hess grade 4 SAH.  Patient was intubated in the ED for airway protection, and subsequently started on nimodipine.  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 Patient successfully extubated on 9/21.   Significant Events: 9/09 admit  9/10 cerebral angiogram with coiling 9/11 extubated > reintubated due to stridor and started steroids 9/13 wheezing, respiratory acidosis > paralytic x 1, deep sedation. Started on nimbex and solumedrol 9/14 on nimbex due to vent dynschorny. On fent, versed and diprivan. On Cardene for goal sbp <160.  On tube feeds. 9/16 remained paralyzed, diuresed, attempt to get paralytics off  9/17 diuresed very well overnight but Na increased, tolerated nimbex off, minimizing sedation  9/20 diuresed well and beginning active weaning 9/21 Net negative 8 L, weaning well > Extubated 9/22 failed SLP, off precedex 9/23 MBS > dys 1 diet; urinary retention  Subjective: Much more alert today. Is able to answer some direct questions appropriately. Some speech is rambling. Denies CP, n/v, abdom pain, or sob.   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 9/10 Care per NS  Acute idiopathic encephalopathy Reportedly pt had been steadily improving post-op until 9/25 when family noted an acute change to her current MS - continuous EEG has not revealed seizure activity - CTa has ruled out new ICH - ammonia, cortisol, B12, and folate all normal - Na is now normal - cont supportive care - MRI w/o acute findings    Signif Vitamin D deficiency  Began  replacement via feeding tube 04/12/18  Hypernatremia  Na is now normal - follow to assure stability   Acute respiratory failure with hypoxia & hypercapnia resolved- extubated 9/21  Postextubation stridor 9/11 Resolved   Diarrhea No clinical features to suggest C diff - pt was actively undergoing chemo tx prior to admit - suspect this is chemo related - continue scheduled imodium   Rectal ulceration  Suspect due to chemo - WOC eval noted - will need more thorough eval once pt more stable - will likely be ready for Gen Surgery consultation by 04/14/18  Macrocytic anemia  W11 and folic acid normal - likely due to chemotherapy   Aspiration PNA SLP mild aspiration risk - treated with Unasyn for aspiration pneumonia - follow w/ resumption of oral diet   Dysphagia SLP recommending dysphagia 1 diet w/ honey thick liquids   Deconditioning  Acute urinary retention Foley was reinserted 9/24 due to urinary retention - plan to attempt to remove as pt becomes more mobile   Stage 4 breast cancer s/p mastectomy, XRT to brain met, and recently on chemo Chemo induced anemia and thrombocytopenia - will need outpatient Oncology follow-up  DVT prophylaxis: SCDs Code Status: FULL CODE Family Communication: spoke w/ husband at bedside  Disposition Plan: SDU  Consultants:   Neurosurgery  PCCM  PMR  Palliative care  Antimicrobials:  None presently    Objective: Blood pressure (!) 169/88, pulse 88, temperature 99.9 F (37.7 C), temperature source Axillary, resp. rate (!) 21, height _0  (1.626 m), weight 95.1 kg, last menstrual period 08/14/2010, SpO2 100 %.  Intake/Output Summary (Last 24  hours) at 04/13/2018 1427 Last data filed at 04/13/2018 1300 Gross per 24 hour  Intake 1911.41 ml  Output 2350 ml  Net -438.59 ml   Filed Weights   04/11/18 0600 04/12/18 0500 04/13/18 0500  Weight: 94.1 kg 95.1 kg 95.1 kg    Examination: General: No acute respiratory distress -  markedly more interactive today  Lungs: CTA B w/o wheezing  Cardiovascular: RRR w/o M or rub  Abdomen: Soft, NT/ND, BS+, no rebound, no mass Extremities: trace edema B LE w/o signif change   CBC: Recent Labs  Lab 04/09/18 0500 04/10/18 0434 04/11/18 0419 04/13/18 0511  WBC 7.8 8.5 10.5 10.6*  NEUTROABS 4.9  --   --   --   HGB 7.6* 8.5* 8.3* 8.1*  HCT 27.3* 29.9* 29.7* 27.2*  MCV 109.2* 109.1* 109.2* 104.6*  PLT 255 224 208 374   Basic Metabolic Panel: Recent Labs  Lab 04/07/18 0448 04/08/18 0900 04/09/18 0500  04/11/18 0419 04/12/18 0620 04/13/18 0511  NA 155* 159* 150*   < > 149* 136 141  K 3.0* 2.8* 2.8*   < > 3.6 4.1 4.1  CL 121* 129* 121*   < > 122* 111 108  CO2 22 22 21*   < > 21* 20* 23  GLUCOSE 129* 175* 285*   < > 147* 221* 129*  BUN _0 < > _1 CREATININE 0.76 0.88 0.78   < > 0.68 0.66 0.70  CALCIUM 8.7* 8.6* 8.1*   < > 8.3* 8.0* 8.0*  MG 2.2 2.3 2.1  --   --   --   --   PHOS 3.1  --   --   --   --   --   --    < > = values in this interval not displayed.   GFR: Estimated Creatinine Clearance: 83.7 mL/min (by C-G formula based on SCr of 0.7 mg/dL).  Liver Function Tests: Recent Labs  Lab 04/07/18 0448 04/10/18 0434 04/13/18 0511  AST  --  23 27  ALT  --  25 22  ALKPHOS  --  60 81  BILITOT  --  0.5 0.5  PROT  --  6.2* 5.8*  ALBUMIN 2.7* 2.8* 2.8*    CBG: Recent Labs  Lab 04/12/18 1926 04/12/18 2314 04/13/18 0316 04/13/18 0736 04/13/18 1158  GLUCAP 126* 92 123* 131* 89    Recent Results (from the past 240 hour(s))  Urine Culture     Status: None   Collection Time: 04/07/18  8:00 PM  Result Value Ref Range Status   Specimen Description URINE, CATHETERIZED  Final   Special Requests NONE  Final   Culture   Final    NO GROWTH Performed at [facility], [place] [address], [place], [place] [address]    Report Status 04/09/2018 FINAL  Final     Scheduled Meds: . amLODipine  10 mg Per Tube Daily  . carvedilol  25 mg Per  Tube BID WC  . chlorhexidine gluconate (MEDLINE KIT)  15 mL Mouth Rinse BID  . Chlorhexidine Gluconate Cloth  6 each Topical Daily  . cholecalciferol  800 Units Per Tube Daily  . enoxaparin (LOVENOX) injection  30 mg Subcutaneous Q24H  . famotidine  20 mg Per Tube Daily  . feeding supplement (PRO-STAT SUGAR FREE 64)  30 mL Per Tube Daily  . fluconazole  100 mg Per Tube Daily  . free water  200 mL Per Tube Q8H  .  Gerhardt's butt cream   Topical QID  . insulin aspart  0-15 Units Subcutaneous Q4H  . labetalol  100 mg Per Tube TID  . levETIRAcetam  750 mg Per Tube BID  . loperamide HCl  2 mg Per Tube Q6H  . phenytoin  100 mg Per Tube TID  . QUEtiapine  25 mg Oral QHS  . sodium chloride flush  10-40 mL Intracatheter Q12H     LOS: 22 days   Cherene Altes, MD [facility] Office  [phone] Pager - Text Page per Amion  If 7PM-7AM, please contact night-coverage per Amion 04/13/2018, 2:27 PM

## 2018-04-13 NOTE — Progress Notes (Signed)
Subjective: She is more awake and alert today than yesterday  Exam: Vitals:   04/13/18 1600 04/13/18 1700  BP: (!) 156/70 (!) 153/83  Pulse: 90 87  Resp: 20 (!) 22  Temp: 99.5 F (37.5 C)   SpO2: 100% 100%   Gen: In bed, NAD Resp: non-labored breathing, no acute distress Abd: soft, nt  Neuro: MS: Able to tell me her name, able to identify the 2 family members in the room, follows simple commands.  More awake and interactive than yesterday. CN: Pupils equal round reactive, tracks across midline in both directions Motor: Good strength throughout Sensory: Intact light touch  Pertinent Labs: Ammonia, TSH, RPR, cortisol all within normal limits  Impression: 62 year old female with subarachnoid hemorrhage and subsequent concern for seizures with persistent but improving lethargy/encephalopathy.  I do wonder if delirium has played a role in her current state of mind and do note that Keppra can be deliriogenic.  I would favor decreasing Keppra at this time and continue to monitor.  Recommendations: 1) continue Dilantin 2) Reduce Keppra to 750 mill grams twice daily  Roland Rack, MD [facility] [phone]  If 7pm- 7am, please page neurology on call as listed in [place].

## 2018-04-13 NOTE — Progress Notes (Signed)
Report received from [place], RN; Patient arrived from 4N ICU, vitals stable. Husband and belongings at bedside.  Patient oriented x2. Continue to monitor.

## 2018-04-13 NOTE — Progress Notes (Signed)
Physical Therapy Treatment Patient Details Name: Diane Short MRN: [id number] DOB: [birth year] Today's Date: 04/13/2018    History of Present Illness 62 yo female hx smoking, stage 4 breast cancer with brain met tx with chemo and brain XRT, cerebral aneurysms s/p intervention presented with persistent headache, and altered mental status.  Found to have stable intraventricular hemorrhage as well as subarachnoid hemorrhage concentrated in left prepontine cistern and left CP angle cistern.  Intubated 9/9-9/11 and re-intubated 9/11-9/21. CXR 9/22 mild patchy/platelike lower lobe opacities, likely atelectasis.    PT Comments    More alert, more responsive to therapist and activities, but still with short focus.  Emphasis on transition side to sit, sitting balance and transfer to the chair.  Much time spent rolling with hold of side lying for peri care .   Follow Up Recommendations  Supervision/Assistance - 24 hour;SNF     Equipment Recommendations  Other (comment)(TBA at last venue)    Recommendations for Other Services       Precautions / Restrictions Precautions Precautions: Fall    Mobility  Bed Mobility Overal bed mobility: Needs Assistance Bed Mobility: Rolling;Sidelying to Sit Rolling: Max assist(multiple times for peri care) Sidelying to sit: Max assist;Total assist;+2 for physical assistance       General bed mobility comments: maximal cuing to keep pt's focus on task and assist given slow enough to allow pt to help minimally with left UE  Transfers Overall transfer level: Needs assistance   Transfers: Squat Pivot Transfers     Squat pivot transfers: Total assist;+2 physical assistance     General transfer comment: cues for sequencing.  Pt "bear hugged" therapist to assist.  Ambulation/Gait                 Stairs             Wheelchair Mobility    Modified Rankin (Stroke Patients Only) Modified Rankin (Stroke Patients Only) Modified  Rankin: Severe disability     Balance   Sitting-balance support: Feet supported Sitting balance-Leahy Scale: Poor Sitting balance - Comments: posterior bias,  placement and stabilizing of bil hands/UE's to let pt assist sitting                                    Cognition Arousal/Alertness: Awake/alert Behavior During Therapy: Flat affect;WFL for tasks assessed/performed Overall Cognitive Status: Impaired/Different from baseline(not test formally)                                        Exercises Other Exercises Other Exercises: bil bicep/tricep presses x 5 reps Other Exercises: bil hip/knee flexion/ext ROM with graded resistance.    General Comments General comments (skin integrity, edema, etc.): Very short focus, a little more responsive to commands and activites.      Pertinent Vitals/Pain Pain Assessment: Faces Faces Pain Scale: Hurts little more Pain Location: bottom with cleaning Pain Descriptors / Indicators: Guarding;Grimacing Pain Intervention(s): Limited activity within patient's tolerance    Home Living                      Prior Function            PT Goals (current goals can now be found in the care plan section) Acute Rehab PT Goals Patient Stated Goal: to get stronger  PT Goal Formulation: With patient Time For Goal Achievement: 04/20/18 Potential to Achieve Goals: Fair Progress towards PT goals: Progressing toward goals    Frequency    Min 3X/week      PT Plan Current plan remains appropriate    Co-evaluation              AM-PAC PT "6 Clicks" Daily Activity  Outcome Measure  Difficulty turning over in bed (including adjusting bedclothes, sheets and blankets)?: Unable Difficulty moving from lying on back to sitting on the side of the bed? : Unable Difficulty sitting down on and standing up from a chair with arms (e.g., wheelchair, bedside commode, etc,.)?: Unable Help needed moving to and from  a bed to chair (including a wheelchair)?: Total Help needed walking in hospital room?: Total Help needed climbing 3-5 steps with a railing? : Total 6 Click Score: 6    End of Session   Activity Tolerance: Patient tolerated treatment well Patient left: in chair;with call bell/phone within reach;with chair alarm set;with family/visitor present(on lift pad) Nurse Communication: Mobility status PT Visit Diagnosis: Hemiplegia and hemiparesis;Other abnormalities of gait and mobility (R26.89);Muscle weakness (generalized) (M62.81) Hemiplegia - Right/Left: Right Hemiplegia - caused by: Nontraumatic intracerebral hemorrhage     Time: 1248-1316 PT Time Calculation (min) (ACUTE ONLY): 28 min  Charges:  $Therapeutic Activity: 23-37 mins                     04/13/2018  Diane Short, PT Acute Rehabilitation Services [phone]  (pager) [phone]  (office)   Diane Short Diane Short 04/13/2018, 4:24 PM

## 2018-04-14 ENCOUNTER — Inpatient Hospital Stay (HOSPITAL_COMMUNITY): Payer: Medicaid Other

## 2018-04-14 LAB — BASIC METABOLIC PANEL
ANION GAP: 9 (ref 5–15)
BUN: 10 mg/dL (ref 6–20)
CALCIUM: 8.7 mg/dL — AB (ref 8.9–10.3)
CO2: 25 mmol/L (ref 22–32)
Chloride: 104 mmol/L (ref 98–111)
Creatinine, Ser: 0.62 mg/dL (ref 0.44–1.00)
GFR calc Af Amer: >60 mL/min (ref >60)
GLUCOSE: 140 mg/dL — AB (ref 70–99)
POTASSIUM: 3.8 mmol/L (ref 3.5–5.1)
SODIUM: 138 mmol/L (ref 135–145)

## 2018-04-14 LAB — GLUCOSE, CAPILLARY
GLUCOSE-CAPILLARY: 123 mg/dL — AB (ref 70–99)
GLUCOSE-CAPILLARY: 152 mg/dL — AB (ref 70–99)
Glucose-Capillary: 153 mg/dL — ABNORMAL HIGH (ref 70–99)
Glucose-Capillary: 141 mg/dL — ABNORMAL HIGH (ref 70–99)
Glucose-Capillary: 108 mg/dL — ABNORMAL HIGH (ref 70–99)
Glucose-Capillary: 136 mg/dL — ABNORMAL HIGH (ref 70–99)

## 2018-04-14 MED ORDER — ENSURE ENLIVE PO LIQD
237.0000 mL | Freq: Two times a day (BID) | ORAL | Status: DC
  Administered 2018-04-14 – 2018-04-15 (×3): 237 mL via ORAL

## 2018-04-14 NOTE — Progress Notes (Signed)
Modified Barium Swallow Progress Note  Patient Details  Name: Diane Short MRN: [id number] Date of Birth: Mar 18, 1956  Today's Date: 04/14/2018  Modified Barium Swallow completed.  Full report located under Chart Review in the Imaging Section.  Brief recommendations include the following:  Clinical Impression  Pt presents with improved oropharyngeal swallow, due in large measure to improved cognition (level of alertness, spontaneity, response time).  Demonstrates mild oral delays but with active bolus manipulation and improved control in general.  Purees/soft solids reach just below valleculae at time of swallow trigger; the head of liquid boluses reaches pyriforms when swallow is triggered - for liquids, this leads to penetration of nectars before the swallow (PAS #2, ejected from larynx upon completion of swallow), and sensed aspiration of thin liquids.  Pt continues to aspirate posteriorly into trachea (trace amounts), likely still attributable to glottal incompetence s/p prolonged intubation.  Compensatory postures (head turns, chin tuck) had no notable effect on airway protection with thin liquids. There was trace vallecular residue post-swallow. Given pt's motivation to eat, recommend resuming PO diet - dysphagia 2, nectar-thick liquids; meds whole in puree.  SLP will follow for safety/diet progression. Pt's spouse was present for exam and agrees with plan.   Swallow Evaluation Recommendations       SLP Diet Recommendations: Dysphagia 2 (Fine chop) solids;Nectar thick liquid   Liquid Administration via: Cup;Straw   Medication Administration: Whole meds with puree   Supervision: Full assist for feeding   Compensations: Slow rate;Small sips/bites   Postural Changes: Remain semi-upright after after feeds/meals (Comment)   Oral Care Recommendations: Oral care BID       Buel Molder L. Tivis Ringer, [place] Office number [phone] Pager  [phone]  Juan Quam Laurice 04/14/2018,3:02 PM

## 2018-04-14 NOTE — Progress Notes (Signed)
Nutrition Follow-up  DOCUMENTATION CODES:   Obesity unspecified  INTERVENTION:  Provide Ensure Enlive po BID (thickened to appropriate consistency), each supplement provides 350 kcal and 20 grams of protein.  Encourage adequate PO intake.   NUTRITION DIAGNOSIS:   Inadequate oral intake related to dysphagia as evidenced by meal completion < 50%; progressing  GOAL:   Patient will meet greater than or equal to 90% of their needs; not met  MONITOR:   PO intake, Diet advancement, Supplement acceptance  REASON FOR ASSESSMENT:   Consult Enteral/tube feeding initiation and management  ASSESSMENT:   62 yo female with PMH of HTN, GERD, hiatal hernia, breast cancer with brain mets (receiving chemo & XRT), and osteonecrosis of R jaw bone who was admitted on 9/9 with SAH. 9/21 extubated  Pt underwent MBS evaluation today. Diet has been advanced to a dysphagia 2 diet with nectar thick liquids. Tube feeding orders have been discontinued. RD to order Ensure to aid in caloric and protein needs. Labs and medications reviewed.  Diet Order:   Diet Order            DIET DYS 2 Room service appropriate? Yes; Fluid consistency: Nectar Thick  Diet effective now              EDUCATION NEEDS:   No education needs have been identified at this time  Skin:  Skin Assessment: Skin Integrity Issues: Skin Integrity Issues:: Stage III Stage III: anus  Last BM:  10/1  Height:   Ht Readings from Last 1 Encounters:  04/11/18 '5\' 4"'  (1.626 m)    Weight:   Wt Readings from Last 1 Encounters:  04/14/18 95.3 kg    Ideal Body Weight:  54.5 kg  BMI:  Body mass index is 36.06 kg/m.  Estimated Nutritional Needs:   Kcal:  1800-2000  Protein:  90-110 grams  Fluid:  > 1.8 L/day    Corrin Parker, MS, RD, LDN Pager # [phone] After hours/ weekend pager # [phone]

## 2018-04-14 NOTE — Progress Notes (Signed)
Subjective: She is more awake and alert today than yesterday  Exam: Vitals:   04/14/18 1132 04/14/18 1619  BP: (!) 160/83 135/62  Pulse: 91 90  Resp: 17 16  Temp: 98.1 F (36.7 C) 98.6 F (37 C)  SpO2: 93% 96%   Gen: In bed, NAD Resp: non-labored breathing, no acute distress Abd: soft, nt  Neuro: MS: Able to tell me her name, able to identify the 2 family members in the room, follows simple commands.  More awake and interactive than yesterday. CN: Pupils equal round reactive, tracks across midline in both directions Motor: Good strength throughout Sensory: Intact light touch  Pertinent Labs: Ammonia, TSH, RPR, cortisol all within normal limits  Impression: 62 year old female with subarachnoid hemorrhage and subsequent concern for seizures with persistent but improving lethargy/encephalopathy.  I do wonder if delirium has played a role in her current state of mind and do note that Keppra can be deliriogenic.  She seems to be improving.  Recommendations: 1) continue Dilantin 2) continue Keppra 750 mg twice daily  Roland Rack, MD [facility] [phone]  If 7pm- 7am, please page neurology on call as listed in [place].

## 2018-04-14 NOTE — Progress Notes (Signed)
  Speech Language Pathology Treatment: Dysphagia;Cognitive-Linquistic  Patient Details Name: Diane Short MRN: [id number] DOB: Jul 07, 1956 Today's Date: 04/14/2018 Time: 8871-9597 SLP Time Calculation (min) (ACUTE ONLY): 25 min  Assessment / Plan / Recommendation Clinical Impression  Pt alert, interactive, smiling and joking this am.  Sitting in recliner; husband, Diane Short present.  Pt oriented to self/biographical information; stated address and names of children/grandchildren with only one error in identification. Disoriented to year and elements of time.  Demonstrates improved vocal quality and volume of speech; continues to require min cues for inhalation/pacing of breath/speech. Sustained attention for duration of 25 minute session.   Trial POs of purees were offered - pt held spoon with hand-over-hand assist RUE; demonstrated improved effort/timing of mastication.  Swallow was palpated with no overt s/s of aspiration present. Pt ready for repeat MBS (last study was 9/23) now that she is more alert/participatory.  She clapped her hands when we discussed potential to resume POs.  Study scheduled for 1:30 today - spouse plans to attend.   HPI HPI: 62 yo female hx smoking, stage 4 breast cancer with brain met tx with chemo and brain XRT, cerebral aneurysms s/p intervention presented with persistent headache, and altered mental status. Found to have stable intraventricular hemorrhage as well as subarachnoid hemorrhage concentrated in left prepontine cistern and left CP angle cistern. Intubated 9/9-9/11 and re-intubated 9/11-9/21. CXR 9/22 mild patchy/platelike lower lobe opacities, likely atelectasis. Multiple attempts at NGT unsuccessful and pt has crucial po medication.        SLP Plan  Continue with current plan of care;MBS       Recommendations  Diet recommendations: NPO                Plan: Continue with current plan of care;MBS       GO                Juan Quam  Laurice 04/14/2018, 11:12 AM  Estill Bamberg L. Tivis Ringer, [place] Office number [phone] Pager [phone]

## 2018-04-14 NOTE — Progress Notes (Signed)
PT Cancellation Note  Patient Details Name: Dara Beidleman MRN: [id number] DOB: [birth year]   Cancelled Treatment:    Reason Eval/Treat Not Completed: Patient at procedure or test/unavailable; patient down for swallow test and RN reports nursing assisted pt up to chair earlier today and sat up 2 hours.  Will attempt another day.   Reginia Naas 04/14/2018, 1:40 PM  Magda Kiel, [place] [phone] 04/14/2018

## 2018-04-14 NOTE — Progress Notes (Signed)
  NEUROSURGERY PROGRESS NOTE   No issues overnight. Pt much more awake today, interactive. Denies any real complaints of pain.  EXAM:  BP (!) 160/83 (BP Location: Left Leg)   Pulse 91   Temp 98.1 F (36.7 C) (Oral)   Resp 17   Ht 5\' 4"  (1.626 m)   Wt 95.3 kg   LMP 08/14/2010 (Exact Date)   SpO2 93%   BMI 36.06 kg/m   Awake, alert Speech fluent, much better volume/phonation today CN grossly intact  Generalized but non-focal weakness.   IMPRESSION:  62 y.o. female s/p SAH, appears to be improving in terms of level of consciousness/alertness  PLAN: - If she continues to improve, I think re-evaluation for possible CIR would be reasonable as she may then be able to participate in therapy.

## 2018-04-14 NOTE — Progress Notes (Addendum)
[facility] TEAM 1 - Stepdown/ICU TEAM  Diane Short  CNO:[id number] DOB: September 24, 1955 DOA: 03/22/2018 PCP: Burnard Bunting, MD    Brief Narrative:   62 year old female with past medical history of HTN, neuropathy, GERD, depression, cerebral aneurysm status post intervention who presented to the hospital with persistent headache and altered mental status, and was found to have Hunt Hess grade 4 SAH.  Patient was intubated in the ED for airway protection, and subsequently started on nimodipine.  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 Patient was successfully extubated on 9/21.   Significant Events: 9/09 admit  9/10 cerebral angiogram with coiling 9/11 extubated > reintubated due to stridor and started steroids 9/13 wheezing, respiratory acidosis > paralytic x 1, deep sedation. Started on nimbex and solumedrol 9/14 on nimbex due to vent dynschorny. On fent, versed and diprivan. On Cardene for goal sbp <160.  On tube feeds. 9/16 remained paralyzed, diuresed, attempt to get paralytics off  9/17 diuresed very well overnight but Na increased, tolerated nimbex off, minimizing sedation  9/20 diuresed well and beginning active weaning 9/21 Net negative 8 L, weaning well > Extubated 9/22 failed SLP, off precedex 9/23 MBS > dys 1 diet; urinary retention 04/14/18- MBS repeat  Subjective: Patient's husband at bedside states that patient looks much better today.  Patient feels better.  He denies any dizziness lightheadedness headache.  No cough shortness of breath chest pain.  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 Nerurosurgery on board.  Continue to follow neurosurgery recommendation  Acute idiopathic encephalopathy. Improving.  Neurology on board.  On Dilantin.  Keppra dose was decreased.  Significant Vitamin D deficiency on vitamin D  replacement.  Hypernatremia . Improved and resolved.  Acute respiratory failure with hypoxia & hypercapnia. resolved- extubated 9/21.  Continue to monitor closely.  Diarrhea. Improving. On imodium.   Rectal ulceration -examined by me.  Mild. Continue local care.  Macrocytic anemia. Q94 and folic acid normal - likely due to chemotherapy   Aspiration pneumonia/dysphagia. For MBS today. On cortrak   Hypertension.  Continue monitor. On labetolol, amlod and coreg.  Deconditioning/debility. PT and OT on board. Recommend SNF.  Acute urinary retention- discontinued foley today. Watch closely.  Stage 4 breast cancer s/p mastectomy, XRT to brain met, and recently on chemo. Follow oncology as outpatient.   Chemo induced anemia and thrombocytopenia - will need outpatient Oncology follow-up  DVT prophylaxis: lovenox  Code Status: FULL CODE  Family Communication: Spoke with the patient's husband at bedside and updated him about the clinical condition of the patient.  Disposition Plan: SNF  Consultants:   Neurosurgery  PCCM  PMR  Palliative care  Antimicrobials:  Fluconazole    Objective: Blood pressure (!) 171/71, pulse 99, temperature 97.9 F (36.6 C), temperature source Oral, resp. rate 18, height '5\' 4"'  (1.626 m), weight 95.3 kg, last menstrual period 08/14/2010, SpO2 97 %.  Intake/Output Summary (Last 24 hours) at 04/14/2018 1146 Last data filed at 04/14/2018 0930 Gross per 24 hour  Intake 720 ml  Output 1150 ml  Net -430 ml   Filed Weights   04/12/18 0500 04/13/18 0500 04/14/18 0456  Weight: 95.1 kg 95.1 kg 95.3 kg    Examination: General: No acute respiratory distress, obese- markedly more interactive today. Dobbhoff tube in place  Lungs: CTA bilaterally, no wheezes or ronchi. Cardiovascular: RRR , s1 and S2 heard. Abdomen: Soft,  NT/ND, BS+, no rebound, no mass Extremities: trace edema B LE  CNS: moves all the extremities, alert awake and communicative.   Few commands.  CBC: Recent Labs  Lab 04/09/18 0500 04/10/18 0434 04/11/18 0419 04/13/18 0511  WBC 7.8 8.5 10.5 10.6*  NEUTROABS 4.9  --   --   --   HGB 7.6* 8.5* 8.3* 8.1*  HCT 27.3* 29.9* 29.7* 27.2*  MCV 109.2* 109.1* 109.2* 104.6*  PLT 255 224 208 704   Basic Metabolic Panel: Recent Labs  Lab 04/08/18 0900 04/09/18 0500  04/12/18 0620 04/13/18 0511 04/14/18 0205  NA 159* 150*   < > 136 141 138  K 2.8* 2.8*   < > 4.1 4.1 3.8  CL 129* 121*   < > 111 108 104  CO2 22 21*   < > 20* 23 25  GLUCOSE 175* 285*   < > 221* 129* 140*  BUN 14 13   < > '9 8 10  ' CREATININE 0.88 0.78   < > 0.66 0.70 0.62  CALCIUM 8.6* 8.1*   < > 8.0* 8.0* 8.7*  MG 2.3 2.1  --   --   --   --    < > = values in this interval not displayed.   GFR: Estimated Creatinine Clearance: 83.7 mL/min (by C-G formula based on SCr of 0.62 mg/dL).  Liver Function Tests: Recent Labs  Lab 04/10/18 0434 04/13/18 0511  AST 23 27  ALT 25 22  ALKPHOS 60 81  BILITOT 0.5 0.5  PROT 6.2* 5.8*  ALBUMIN 2.8* 2.8*    CBG: Recent Labs  Lab 04/13/18 1547 04/13/18 2055 04/14/18 0049 04/14/18 0419 04/14/18 0842  GLUCAP 100* 123* 123* 153* 141*    Recent Results (from the past 240 hour(s))  Urine Culture     Status: None   Collection Time: 04/07/18  8:00 PM  Result Value Ref Range Status   Specimen Description URINE, CATHETERIZED  Final   Special Requests NONE  Final   Culture   Final    NO GROWTH Performed at [facility], [place] [address] [place], [place] [address]    Report Status 04/09/2018 FINAL  Final     Scheduled Meds: . amLODipine  10 mg Per Tube Daily  . carvedilol  25 mg Per Tube BID WC  . chlorhexidine gluconate (MEDLINE KIT)  15 mL Mouth Rinse BID  . Chlorhexidine Gluconate Cloth  6 each Topical Daily  . cholecalciferol  800 Units Per Tube Daily  . enoxaparin (LOVENOX) injection  30 mg Subcutaneous Q24H  . famotidine  20 mg Per Tube Daily  . feeding supplement (PRO-STAT SUGAR FREE  64)  30 mL Per Tube Daily  . fluconazole  100 mg Per Tube Daily  . free water  200 mL Per Tube Q8H  . Gerhardt's butt cream   Topical QID  . insulin aspart  0-15 Units Subcutaneous Q4H  . labetalol  100 mg Per Tube TID  . levETIRAcetam  750 mg Per Tube BID  . loperamide HCl  2 mg Per Tube Q6H  . phenytoin  100 mg Per Tube TID  . QUEtiapine  25 mg Oral QHS  . sodium chloride flush  10-40 mL Intracatheter Q12H     LOS: 23 days   Oscar La, MD  If 7PM-7AM, please contact night-coverage per Amion 04/14/2018, 11:46 AM

## 2018-04-15 LAB — BASIC METABOLIC PANEL
ANION GAP: 7 (ref 5–15)
BUN: 11 mg/dL (ref 6–20)
CHLORIDE: 105 mmol/L (ref 98–111)
CO2: 26 mmol/L (ref 22–32)
Calcium: 8.6 mg/dL — ABNORMAL LOW (ref 8.9–10.3)
Creatinine, Ser: 0.58 mg/dL (ref 0.44–1.00)
GFR calc Af Amer: >60 mL/min (ref >60)
Glucose, Bld: 105 mg/dL — ABNORMAL HIGH (ref 70–99)
Potassium: 3.6 mmol/L (ref 3.5–5.1)
SODIUM: 138 mmol/L (ref 135–145)

## 2018-04-15 LAB — CBC
HEMATOCRIT: 28.5 % — AB (ref 36.0–46.0)
HEMOGLOBIN: 8.4 g/dL — AB (ref 12.0–15.0)
MCH: 30.7 pg (ref 26.0–34.0)
MCHC: 29.5 g/dL — ABNORMAL LOW (ref 30.0–36.0)
MCV: 104 fL — AB (ref 78.0–100.0)
Platelets: 169 10*3/uL (ref 150–400)
RBC: 2.74 MIL/uL — ABNORMAL LOW (ref 3.87–5.11)
RDW: 19.9 % — ABNORMAL HIGH (ref 11.5–15.5)
WBC: 5.8 10*3/uL (ref 4.0–10.5)

## 2018-04-15 LAB — GLUCOSE, CAPILLARY
GLUCOSE-CAPILLARY: 103 mg/dL — AB (ref 70–99)
GLUCOSE-CAPILLARY: 108 mg/dL — AB (ref 70–99)
GLUCOSE-CAPILLARY: 114 mg/dL — AB (ref 70–99)

## 2018-04-15 LAB — MAGNESIUM: Magnesium: 2.3 mg/dL (ref 1.7–2.4)

## 2018-04-15 MED ORDER — RESOURCE THICKENUP CLEAR PO POWD
ORAL | Status: DC | PRN
  Filled 2018-04-15: qty 125

## 2018-04-15 MED ORDER — ORAL CARE MOUTH RINSE
15.0000 mL | Freq: Two times a day (BID) | OROMUCOSAL | Status: DC
  Administered 2018-04-16: 15 mL via OROMUCOSAL

## 2018-04-15 NOTE — Progress Notes (Signed)
[place] TEAM 1 - Stepdown/ICU TEAM  Diane Short  ZWC:[id number] DOB: [birth year] DOA: 03/22/2018 PCP: Burnard Bunting, MD    Brief Narrative:   62 year old female with past medical history of HTN, neuropathy, GERD, depression, cerebral aneurysm status post intervention who presented to the hospital with persistent headache and altered mental status, and was found to have Hunt Hess grade 4 SAH.  Patient was intubated in the ED for airway protection, and subsequently started on nimodipine.  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 Patient was successfully extubated on 9/21.   Significant Events: 9/09 admit  9/10 cerebral angiogram with coiling 9/11 extubated > reintubated due to stridor and started steroids 9/13 wheezing, respiratory acidosis > paralytic x 1, deep sedation. Started on nimbex and solumedrol 9/14 on nimbex due to vent dynschorny. On fent, versed and diprivan. On Cardene for goal sbp <160.  On tube feeds. 9/16 remained paralyzed, diuresed, attempt to get paralytics off  9/17 diuresed very well overnight but Na increased, tolerated nimbex off, minimizing sedation  9/20 diuresed well and beginning active weaning 9/21 Net negative 8 L, weaning well > Extubated 9/22 failed SLP, off precedex 9/23 MBS > dys 1 diet; urinary retention 04/14/18- MBS repeat  Subjective: Spoke with the patient's family at bedside.  Patient has improved overall.  Denies any headache nausea vomiting.    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 Nerurosurgery on board.  Continue to follow neurosurgery recommendation  Acute idiopathic encephalopathy. Improving.  Neurology on board.  On Dilantin.  Keppra dose was decreased.  Neurology on board  Significant Vitamin D deficiency on vitamin D replacement.  Hypernatremia . Improved and resolved.  Acute respiratory  failure with hypoxia & hypercapnia. resolved- extubated 9/21.  Continue to monitor closely.  Diarrhea.  On PRN imodium.   Rectal ulceration -examined by me.  Mild. Continue local care.  Macrocytic anemia. M35 and folic acid normal - likely due to chemotherapy   Aspiration pneumonia/dysphagia.  Status post modified barium swallow.  On dysphagia 2 diet.  Completed treatment with Unasyn.  Hypertension.  Continue monitor. On labetolol, amlod and coreg.  Deconditioning/debility.Physical therapy recommended skilled nursing facility placement  Acute urinary retention-resolved  Stage 4 breast cancer s/p mastectomy, XRT to brain met, and recently on chemo. Follow oncology as outpatient.   Chemo induced anemia and thrombocytopenia - will need outpatient Oncology follow-up.  Monitor CBC closely  DVT prophylaxis: lovenox  Code Status: FULL CODE  Family Communication: I again spoke with the patient's husband at bedside.  Disposition Plan: SNF  Consultants:   Neurosurgery  PCCM  PMR  Palliative care  Antimicrobials:    Objective: Blood pressure (!) 136/55, pulse 85, temperature 98.1 F (36.7 C), temperature source Oral, resp. rate 14, height '5\' 4"'  (1.626 m), weight 91.1 kg, last menstrual period 08/14/2010, SpO2 95 %.  Intake/Output Summary (Last 24 hours) at 04/15/2018 1059 Last data filed at 04/15/2018 0900 Gross per 24 hour  Intake 120 ml  Output -  Net 120 ml   Filed Weights   04/13/18 0500 04/14/18 0456 04/15/18 0300  Weight: 95.1 kg 95.3 kg 91.1 kg    Examination: General exam: Appears calm and comfortable obese HEENT:PERRL,Oral mucosa moist, Ear/Nose normal on gross exam Respiratory system: Diminished breath sounds bilaterally.   Cardiovascular system: S1 & S2 heard, RRR. No JVD, murmurs, rubs, gallops or clicks. No pedal  edema. Gastrointestinal system: Abdomen is nondistended, soft and nontender. No organomegaly or masses felt. Normal bowel sounds  heard. Central nervous system: Alert awake communicative following commands.  Moves all extremities Extremities: No edema, no clubbing ,no cyanosis, distal peripheral pulses palpable. Skin: No rashes, lesions or ulcers,no icterus ,no pallor MSK: Normal muscle bulk,tone ,power  CBC: Recent Labs  Lab 04/09/18 0500  04/11/18 0419 04/13/18 0511 04/15/18 0531  WBC 7.8   < > 10.5 10.6* 5.8  NEUTROABS 4.9  --   --   --   --   HGB 7.6*   < > 8.3* 8.1* 8.4*  HCT 27.3*   < > 29.7* 27.2* 28.5*  MCV 109.2*   < > 109.2* 104.6* 104.0*  PLT 255   < > 208 156 169   < > = values in this interval not displayed.   Basic Metabolic Panel: Recent Labs  Lab 04/09/18 0500  04/13/18 0511 04/14/18 0205 04/15/18 0531  NA 150*   < > 141 138 138  K 2.8*   < > 4.1 3.8 3.6  CL 121*   < > 108 104 105  CO2 21*   < > '23 25 26  ' GLUCOSE 285*   < > 129* 140* 105*  BUN 13   < > '8 10 11  ' CREATININE 0.78   < > 0.70 0.62 0.58  CALCIUM 8.1*   < > 8.0* 8.7* 8.6*  MG 2.1  --   --   --  2.3   < > = values in this interval not displayed.   GFR: Estimated Creatinine Clearance: 81.8 mL/min (by C-G formula based on SCr of 0.58 mg/dL).  Liver Function Tests: Recent Labs  Lab 04/10/18 0434 04/13/18 0511  AST 23 27  ALT 25 22  ALKPHOS 60 81  BILITOT 0.5 0.5  PROT 6.2* 5.8*  ALBUMIN 2.8* 2.8*    CBG: Recent Labs  Lab 04/14/18 1153 04/14/18 1626 04/14/18 1952 04/14/18 2335 04/15/18 0424  GLUCAP 152* 108* 136* 114* 103*    Recent Results (from the past 240 hour(s))  Urine Culture     Status: None   Collection Time: 04/07/18  8:00 PM  Result Value Ref Range Status   Specimen Description URINE, CATHETERIZED  Final   Special Requests NONE  Final   Culture   Final    NO GROWTH Performed at [facility], [place] [address] [place], [place] [address]    Report Status 04/09/2018 FINAL  Final     Scheduled Meds: . amLODipine  10 mg Per Tube Daily  . carvedilol  25 mg Per Tube BID WC  .  chlorhexidine gluconate (MEDLINE KIT)  15 mL Mouth Rinse BID  . Chlorhexidine Gluconate Cloth  6 each Topical Daily  . cholecalciferol  800 Units Per Tube Daily  . enoxaparin (LOVENOX) injection  30 mg Subcutaneous Q24H  . famotidine  20 mg Per Tube Daily  . feeding supplement (ENSURE ENLIVE)  237 mL Oral BID BM  . fluconazole  100 mg Per Tube Daily  . Gerhardt's butt cream   Topical QID  . insulin aspart  0-15 Units Subcutaneous Q4H  . labetalol  100 mg Per Tube TID  . levETIRAcetam  750 mg Per Tube BID  . loperamide HCl  2 mg Per Tube Q6H  . phenytoin  100 mg Per Tube TID  . QUEtiapine  25 mg Oral QHS  . sodium chloride flush  10-40 mL Intracatheter Q12H  LOS: 24 days   Oscar La, MD  If 7PM-7AM, please contact night-coverage per Amion 04/15/2018, 10:59 AM

## 2018-04-15 NOTE — Progress Notes (Signed)
Inpatient Rehabilitation  Per OT request, patient was screened by Takoda Janowiak for appropriateness for an Inpatient Acute Rehab consult.  At this time we are recommending an Inpatient Rehab consult.  Text paged MD to notify; please order if you are agreeable.    Wesly Whisenant, M.A., CCC/SLP Admission Coordinator  [facility]  Cell [phone]  

## 2018-04-15 NOTE — Progress Notes (Signed)
Subjective: Husband feels that she is back to baseline. Exam: Vitals:   04/15/18 1612 04/15/18 2000  BP: 127/63   Pulse: 83   Resp: 12   Temp: 98.1 F (36.7 C) 98.8 F (37.1 C)  SpO2: 99%    Gen: In bed, NAD Resp: non-labored breathing, no acute distress Abd: soft, nt  Neuro: MS: Able to tell me her name, able to identify the 2 family members in the room, follows simple commands.  More awake and interactive than yesterday. CN: Pupils equal round reactive, tracks across midline in both directions Motor: Good strength throughout Sensory: Intact light touch  Pertinent Labs: Ammonia, TSH, RPR, cortisol all within normal limits  Impression: 62 year old female with subarachnoid hemorrhage and subsequent concern for seizures.. I do wonder if delirium has played a role in her current state of mind and do note that Keppra can be deliriogenic.  I continue to be uncertain how much seizures played a role in her presentation, however given how well she is doing currently I would recommend continuing her on 2 antiepileptic medications for the time being  Recommendations: 1) continue Dilantin, when she is able to take larger pills, I will change her to phenytoin ER 300 mg nightly. 2) continue Keppra 750 mg twice daily 3) I have requested outpatient follow-up  4) please call if neurology can be of any further assistance.  Roland Rack, MD [facility] [phone]  If 7pm- 7am, please page neurology on call as listed in [place].

## 2018-04-15 NOTE — Progress Notes (Addendum)
Physical Therapy Treatment Patient Details Name: Diane Short MRN: [id number] DOB: 05-08-1956 Today's Date: 04/15/2018    History of Present Illness 62 yo female hx smoking, stage 4 breast cancer with brain met tx with chemo and brain XRT, cerebral aneurysms s/p intervention presented with persistent headache, and altered mental status.  Found to have stable intraventricular hemorrhage as well as subarachnoid hemorrhage concentrated in left prepontine cistern and left CP angle cistern.  Intubated 9/9-9/11 and re-intubated 9/11-9/21. CXR 9/22 mild patchy/platelike lower lobe opacities, likely atelectasis.    PT Comments    Much improved.  Better ability to follow direction, initiate, participate in conversation and able to utilize extremities appropriately to work on standing in the [facility] with start of pre gait activity.    Follow Up Recommendations  Supervision/Assistance - 24 hour;CIR     Equipment Recommendations  Other (comment)(TBD)    Recommendations for Other Services Rehab consult     Precautions / Restrictions Precautions Precautions: Fall    Mobility  Bed Mobility Overal bed mobility: Needs Assistance Bed Mobility: Rolling;Sidelying to Sit Rolling: Mod assist Sidelying to sit: Mod assist   Sit to supine: Min assist   General bed mobility comments: assist to initiate task, and assist to lift trunk.  Pt spontanseously lowered trunk to bed and required assist to fully lift LEs onto bed   Transfers Overall transfer level: Needs assistance Equipment used: Ambulation equipment used Transfers: Sit to/from Stand Sit to Stand: Mod assist;+2 physical assistance         General transfer comment: assist to power up into standing from bed, and min A to stand from stedy.  Did not transfer pt due to pt had just returned to bed after sitting up in recliner for extended period of time.  Stood x5 working on Nurse, learning disability [place] (Stroke Patients Only)       Balance Overall balance assessment: Needs assistance Sitting-balance support: Feet supported;Single extremity supported Sitting balance-Leahy Scale: Poor Sitting balance - Comments: Pt initially with Rt lateral lean requiring min A, but progressed to min guard assist while performing simple grooming  Postural control: Right lateral lean Standing balance support: Bilateral upper extremity supported Standing balance-Leahy Scale: Poor Standing balance comment: Using stedy, pt able to maintain standing balance with min A, for up to 30-45 seconds.  Pt stood x 5                             Cognition Arousal/Alertness: Awake/alert Behavior During Therapy: Flat affect;WFL for tasks assessed/performed Overall Cognitive Status: Impaired/Different from baseline Area of Impairment: Orientation;Attention;Memory;Following commands;Safety/judgement;Awareness;Problem solving                 Orientation Level: Disoriented to;Time;Place Current Attention Level: Sustained;Selective Memory: Decreased short-term memory Following Commands: Follows one step commands consistently;Follows multi-step commands inconsistently;Follows multi-step commands with increased time Safety/Judgement: Decreased awareness of safety;Decreased awareness of deficits Awareness: Intellectual Problem Solving: Slow processing;Decreased initiation;Difficulty sequencing;Requires tactile cues;Requires verbal cues General Comments: Pt intially lethargic, but as session progressed, pt became more animated.  She demonstrates a delay in processing and initiation, but by end of session, she attempted to tell a complex joke and did so with 75% accuracy (per spouse).  Exercises      General Comments General comments (skin integrity, edema, etc.): Spouse present and very supportive      Pertinent Vitals/Pain  Pain Assessment: No/denies pain    Home Living                      Prior Function            PT Goals (current goals can now be found in the care plan section) Acute Rehab PT Goals PT Goal Formulation: With patient Time For Goal Achievement: 04/20/18 Potential to Achieve Goals: Fair Progress towards PT goals: Progressing toward goals    Frequency    Min 4X/week      PT Plan Current plan remains appropriate;Frequency needs to be updated    Co-evaluation PT/OT/SLP Co-Evaluation/Treatment: Yes Reason for Co-Treatment: Necessary to address cognition/behavior during functional activity PT goals addressed during session: Mobility/safety with mobility OT goals addressed during session: ADL's and self-care      AM-PAC PT "6 Clicks" Daily Activity  Outcome Measure  Difficulty turning over in bed (including adjusting bedclothes, sheets and blankets)?: Unable Difficulty moving from lying on back to sitting on the side of the bed? : Unable Difficulty sitting down on and standing up from a chair with arms (e.g., wheelchair, bedside commode, etc,.)?: Unable Help needed moving to and from a bed to chair (including a wheelchair)?: A Lot Help needed walking in hospital room?: Total Help needed climbing 3-5 steps with a railing? : Total 6 Click Score: 7    End of Session Equipment Utilized During Treatment: Gait belt Activity Tolerance: Patient tolerated treatment well Patient left: in bed;with call bell/phone within reach;with family/visitor present Nurse Communication: Mobility status PT Visit Diagnosis: Hemiplegia and hemiparesis;Other abnormalities of gait and mobility (R26.89);Muscle weakness (generalized) (M62.81) Hemiplegia - Right/Left: Right Hemiplegia - caused by: Nontraumatic intracerebral hemorrhage     Time: 8022-1798 PT Time Calculation (min) (ACUTE ONLY): 33 min  Charges:  $Therapeutic Activity: 8-22 mins                     04/15/2018  Donnella Sham, PT Acute Rehabilitation Services [phone]  (pager) [phone]  (office)   [name] 04/15/2018, 5:15 PM

## 2018-04-15 NOTE — NC FL2 (Signed)
[place] LEVEL OF CARE SCREENING TOOL     IDENTIFICATION  Patient Name: Diane Short Birthdate: 12-26-1955 Sex: female Admission Date (Current Location): 03/22/2018  [facility] and [place] Number:  [employer] and Address:  [facility] [place]. [facility], [place] [address] [place], [place] [address]      Provider Number: [id number]  Attending Physician Name and Address:  Flora Lipps, MD  Relative Name and Phone Number:  Kamelia Lampkins [phone]    Current Level of Care: Hospital Recommended Level of Care: [place] Prior Approval Number:    Date Approved/Denied:   PASRR Number: [id number]  Discharge Plan: SNF    Current Diagnoses: Patient Active Problem List   Diagnosis Date Noted  . Pressure injury of skin 04/11/2018  . Goals of care, counseling/discussion   . Palliative care by specialist   . Adult failure to thrive   . Endotracheal tube present   . SAH (subarachnoid hemorrhage) ([place]) 03/22/2018  . Malignant neoplasm of left breast in female, estrogen receptor negative ([place]) 01/07/2018  . Brain metastasis ([place]) 01/07/2018  . Cerebral aneurysm, nonruptured 03/13/2017    Orientation RESPIRATION BLADDER Height & Weight     Self  Normal Continent Weight: 200 lb 13.4 oz (91.1 kg) Height:  _0  (162.6 cm)  BEHAVIORAL SYMPTOMS/MOOD NEUROLOGICAL BOWEL NUTRITION STATUS      Incontinent(Rectal tube placed 04/11/18) Diet (see discharge summary)  AMBULATORY STATUS COMMUNICATION OF NEEDS Skin   Extensive Assist Verbally(Incomprehensible)- Aphasia PU Stage and Appropriate Care, Surgical wounds, Skin abrasions(Closed incision, right groin; MASD on groin, buttocks)     PU Stage 3 Dressing: (Anus)                 Personal Care Assistance Level of Assistance  Bathing, Feeding, Dressing Bathing Assistance: Maximum assistance Feeding assistance: Maximum assistance Dressing Assistance: Maximum assistance     Functional  Limitations Info  Sight, Hearing, Speech Sight Info: Adequate Hearing Info: Adequate Speech Info: Impaired(Incomprehensible)    SPECIAL CARE FACTORS FREQUENCY  PT (By licensed PT), OT (By licensed OT)     PT Frequency: 5x week OT Frequency: 5x week            Contractures Contractures Info: Not present    Additional Factors Info  Code Status, Allergies, Psychotropic, Insulin Sliding Scale Code Status Info: Full Code Allergies Info: NSAIDS, ACE INHIBITORS  Psychotropic Info: Keppra; QUEtiapine (SEROQUEL) tablet 25 mg daily at bedtime PO Insulin Sliding Scale Info: insulin aspart (novoLOG) injection 0-15 Units every 4 hours       Current Medications (04/15/2018):  This is the current hospital active medication list Current Facility-Administered Medications  Medication Dose Route Frequency Provider Last Rate Last Dose  . 0.9 %  sodium chloride infusion   Intravenous PRN Consuella Lose, MD   Stopped at 04/10/18 1537  . acetaminophen (TYLENOL) solution 650 mg  650 mg Per Tube Q4H PRN Chesley Mires, MD   650 mg at 04/11/18 0842  . amLODipine (NORVASC) tablet 10 mg  10 mg Per Tube Daily Cherene Altes, MD   10 mg at 04/15/18 0900  . carvedilol (COREG) tablet 25 mg  25 mg Per Tube BID WC Cherene Altes, MD   25 mg at 04/15/18 0835  . chlorhexidine gluconate (MEDLINE KIT) (PERIDEX) 0.12 % solution 15 mL  15 mL Mouth Rinse BID Chesley Mires, MD   15 mL at 04/15/18 0849  . Chlorhexidine Gluconate Cloth 2 % PADS 6 each  6 each Topical Daily Consuella Lose, MD   6 each at 04/14/18 1000  . cholecalciferol (D-VI-SOL) 400 UNIT/ML oral liquid 800 Units  800 Units Per Tube Daily Cherene Altes, MD   800 Units at 04/15/18 0900  . enoxaparin (LOVENOX) injection 30 mg  30 mg Subcutaneous Q24H Consuella Lose, MD   30 mg at 04/15/18 0900  . famotidine (PEPCID) tablet 20 mg  20 mg Per Tube Daily Cherene Altes, MD   20 mg at 04/15/18 0900  . feeding supplement (ENSURE ENLIVE)  (ENSURE ENLIVE) liquid 237 mL  237 mL Oral BID BM Pokhrel, Laxman, MD   237 mL at 04/15/18 0900  . fluconazole (DIFLUCAN) 40 MG/ML suspension 100 mg  100 mg Per Tube Daily Cherene Altes, MD   100 mg at 04/15/18 0900  . Gerhardt's butt cream   Topical QID Joette Catching T, MD      . hydrALAZINE (APRESOLINE) injection 10 mg  10 mg Intravenous Q4H PRN Patrecia Pour, Christean Grief, MD   10 mg at 04/11/18 0618  . insulin aspart (novoLOG) injection 0-15 Units  0-15 Units Subcutaneous Q4H Cherene Altes, MD   2 Units at 04/14/18 2100  . labetalol (NORMODYNE) tablet 100 mg  100 mg Per Tube TID Cherene Altes, MD   100 mg at 04/15/18 0900  . levETIRAcetam (KEPPRA) 100 MG/ML solution 750 mg  750 mg Per Tube BID Greta Doom, MD   750 mg at 04/15/18 0900  . loperamide HCl (IMODIUM) 1 MG/7.5ML suspension 2 mg  2 mg Per Tube Q6H Cherene Altes, MD   2 mg at 04/15/18 0545  . metoprolol tartrate (LOPRESSOR) injection 2.5-5 mg  2.5-5 mg Intravenous Q3H PRN Patrecia Pour, Christean Grief, MD   5 mg at 04/09/18 0853  . ondansetron (ZOFRAN-ODT) disintegrating tablet 4 mg  4 mg Oral Q6H PRN Chesley Mires, MD       Or  . ondansetron (ZOFRAN) injection 4 mg  4 mg Intravenous Q6H PRN Chesley Mires, MD      . phenytoin (DILANTIN) 125 MG/5ML suspension 100 mg  100 mg Per Tube TID Cherene Altes, MD   100 mg at 04/15/18 0900  . QUEtiapine (SEROQUEL) tablet 25 mg  25 mg Oral QHS Cherene Altes, MD   25 mg at 04/14/18 2300  . RESOURCE THICKENUP CLEAR   Oral PRN Pokhrel, Laxman, MD      . sodium chloride flush (NS) 0.9 % injection 10-40 mL  10-40 mL Intracatheter Q12H Consuella Lose, MD   10 mL at 04/15/18 0900  . sodium chloride flush (NS) 0.9 % injection 10-40 mL  10-40 mL Intracatheter PRN Cherene Altes, MD   20 mL at 04/10/18 7591     Discharge Medications: Please see discharge summary for a list of discharge medications.  Relevant Imaging Results:  Relevant Lab Results:   Additional  Information [id number] [place], [place]

## 2018-04-15 NOTE — Progress Notes (Signed)
Neurosurgery Progress Note  No issues overnight. No concerns this am  EXAM:  BP (!) 142/64 (BP Location: Left Leg)   Pulse 81   Temp 98.1 F (36.7 C) (Oral)   Resp 16   Ht 5\' 4"  (1.626 m)   Wt 91.1 kg   LMP 08/14/2010 (Exact Date)   SpO2 97%   BMI 34.47 kg/m   Awake, alert, oriented  MAEW, generalized weakness.  No focal deficits  IMPRESSION/PLAN 62 y.o. female s/p SAH, appears to be improving in terms of level of consciousness/alertness. - PT/OT today to determine if better candidate for CIR

## 2018-04-15 NOTE — Progress Notes (Signed)
Occupational Therapy Treatment Patient Details Name: Diane Short MRN: [id number] DOB: August 19, 1955 Today's Date: 04/15/2018    History of present illness 62 yo female hx smoking, stage 4 breast cancer with brain met tx with chemo and brain XRT, cerebral aneurysms s/p intervention presented with persistent headache, and altered mental status.  Found to have stable intraventricular hemorrhage as well as subarachnoid hemorrhage concentrated in left prepontine cistern and left CP angle cistern.  Intubated 9/9-9/11 and re-intubated 9/11-9/21. CXR 9/22 mild patchy/platelike lower lobe opacities, likely atelectasis.   OT comments  Pt with significant improvements in cognition, strength, activity tolerance, and ability to participate actively in therapies.  Pt actually improved cognitively as session progressed, smiling and fully engaging with therapists.  She is now able to perform ADLs with max A, and functional transfers with mod A +2.  Anticipate she will make good progress based on the level of change that has occurred to date.   Family is very supportive.  Recommend CIR.   Follow Up Recommendations  CIR    Equipment Recommendations  3 in 1 bedside commode;Tub/shower bench;Hospital bed    Recommendations for Other Services Rehab consult    Precautions / Restrictions Precautions Precautions: Fall       Mobility Bed Mobility Overal bed mobility: Needs Assistance Bed Mobility: Rolling;Sidelying to Sit Rolling: Mod assist Sidelying to sit: Mod assist   Sit to supine: Min assist   General bed mobility comments: assist to initiate task, and assist to lift trunk.  Pt spontanseously lowered trunk to bed and required assist to fully lift LEs onto bed   Transfers Overall transfer level: Needs assistance Equipment used: Ambulation equipment used Transfers: Sit to/from Stand Sit to Stand: Mod assist;+2 physical assistance         General transfer comment: assist to power up into  standing from bed, and min A to stand from stedy.  Did not transfer pt due to pt had just returned to bed after sitting up in recliner for extended period of time     Balance Overall balance assessment: Needs assistance Sitting-balance support: Feet supported;Single extremity supported Sitting balance-Leahy Scale: Poor Sitting balance - Comments: Pt initially with Rt lateral lean requiring min A, but progressed to min guard assist while performing simple grooming  Postural control: Right lateral lean Standing balance support: Bilateral upper extremity supported Standing balance-Leahy Scale: Poor Standing balance comment: Using stedy, pt able to maintain standing balance with min A, for up to 30-45 seconds.  Pt stood x 5                            ADL either performed or assessed with clinical judgement   ADL Overall ADL's : Needs assistance/impaired     Grooming: Oral care;Wash/dry face;Maximal assistance;Sitting Grooming Details (indicate cue type and reason): Pt able to comb the front portion of her hair, but requires assist to complete task  Upper Body Bathing: Maximal assistance;Bed level   Lower Body Bathing: Maximal assistance;Sit to/from stand   Upper Body Dressing : Maximal assistance;Sitting       Toilet Transfer: Moderate assistance;+2 for physical assistance;+2 for safety/equipment;BSC Toilet Transfer Details (indicate cue type and reason): using stedy  Toileting- Clothing Manipulation and Hygiene: Maximal assistance;Sit to/from stand       Functional mobility during ADLs: Moderate assistance;+2 for physical assistance;+2 for safety/equipment       Vision   Additional Comments: Pt looking Lt and Rt for objects  and to address people as she spoke    Perception     Praxis      Cognition Arousal/Alertness: Awake/alert Behavior During Therapy: Flat affect;WFL for tasks assessed/performed Overall Cognitive Status: Impaired/Different from baseline Area  of Impairment: Orientation;Attention;Memory;Following commands;Safety/judgement;Awareness;Problem solving                 Orientation Level: Disoriented to;Time;Place Current Attention Level: Sustained;Selective Memory: Decreased short-term memory Following Commands: Follows one step commands consistently;Follows multi-step commands inconsistently;Follows multi-step commands with increased time Safety/Judgement: Decreased awareness of safety;Decreased awareness of deficits Awareness: Intellectual Problem Solving: Slow processing;Decreased initiation;Difficulty sequencing;Requires tactile cues;Requires verbal cues General Comments: Pt intially lethargic, but as session progressed, pt became more animated.  She demonstrates a delay in processing and initiation, but by end of session, she attempted to tell a complex joke and did so with 75% accuracy (per spouse).           Exercises     Shoulder Instructions       General Comments spouse present     Pertinent Vitals/ Pain       Pain Assessment: No/denies pain  Home Living                                          Prior Functioning/Environment              Frequency  Min 2X/week        Progress Toward Goals  OT Goals(current goals can now be found in the care plan section)  Progress towards OT goals: Progressing toward goals     Plan Discharge plan needs to be updated    Co-evaluation    PT/OT/SLP Co-Evaluation/Treatment: Yes Reason for Co-Treatment: Necessary to address cognition/behavior during functional activity;For patient/therapist safety;To address functional/ADL transfers   OT goals addressed during session: ADL's and self-care      AM-PAC PT "6 Clicks" Daily Activity     Outcome Measure   Help from another person eating meals?: A Lot Help from another person taking care of personal grooming?: A Lot Help from another person toileting, which includes using toliet, bedpan, or  urinal?: A Lot Help from another person bathing (including washing, rinsing, drying)?: A Lot Help from another person to put on and taking off regular upper body clothing?: A Lot Help from another person to put on and taking off regular lower body clothing?: Total 6 Click Score: 11    End of Session Equipment Utilized During Treatment: Gait belt  OT Visit Diagnosis: Unsteadiness on feet (R26.81);Cognitive communication deficit (R41.841) Symptoms and signs involving cognitive functions: Nontraumatic SAH   Activity Tolerance Patient tolerated treatment well   Patient Left in bed;with call bell/phone within reach;with bed alarm set;with family/visitor present   Nurse Communication Mobility status;Need for lift equipment        Time: 5830-9407 OT Time Calculation (min): 31 min  Charges: OT General Charges $OT Visit: 1 Visit OT Treatments $Therapeutic Activity: 8-22 mins  Lucille Passy, OTR/L [place] Pager [phone] Office [phone]    Lucille Passy M 04/15/2018, 4:18 PM

## 2018-04-16 ENCOUNTER — Encounter (HOSPITAL_COMMUNITY): Payer: Self-pay | Admitting: Emergency Medicine

## 2018-04-16 ENCOUNTER — Inpatient Hospital Stay (HOSPITAL_COMMUNITY)
Admission: RE | Admit: 2018-04-16 | Discharge: 2018-04-22 | DRG: 057 | Disposition: A | Discharge status: Home Health | Payer: Medicaid Other | Source: Intra-hospital | Attending: Physical Medicine & Rehabilitation | Admitting: Physical Medicine & Rehabilitation

## 2018-04-16 ENCOUNTER — Other Ambulatory Visit: Payer: Self-pay

## 2018-04-16 DIAGNOSIS — E669 Obesity, unspecified: Secondary | ICD-10-CM | POA: Diagnosis present

## 2018-04-16 DIAGNOSIS — Z9289 Personal history of other medical treatment: Secondary | ICD-10-CM

## 2018-04-16 DIAGNOSIS — I69091 Dysphagia following nontraumatic subarachnoid hemorrhage: Secondary | ICD-10-CM

## 2018-04-16 DIAGNOSIS — I69051 Hemiplegia and hemiparesis following nontraumatic subarachnoid hemorrhage affecting right dominant side: Principal | ICD-10-CM

## 2018-04-16 DIAGNOSIS — E162 Hypoglycemia, unspecified: Secondary | ICD-10-CM | POA: Diagnosis not present

## 2018-04-16 DIAGNOSIS — Z853 Personal history of malignant neoplasm of breast: Secondary | ICD-10-CM | POA: Diagnosis not present

## 2018-04-16 DIAGNOSIS — Z515 Encounter for palliative care: Secondary | ICD-10-CM | POA: Diagnosis present

## 2018-04-16 DIAGNOSIS — G8191 Hemiplegia, unspecified affecting right dominant side: Secondary | ICD-10-CM | POA: Diagnosis not present

## 2018-04-16 DIAGNOSIS — R0989 Other specified symptoms and signs involving the circulatory and respiratory systems: Secondary | ICD-10-CM

## 2018-04-16 DIAGNOSIS — D72829 Elevated white blood cell count, unspecified: Secondary | ICD-10-CM | POA: Diagnosis present

## 2018-04-16 DIAGNOSIS — I609 Nontraumatic subarachnoid hemorrhage, unspecified: Secondary | ICD-10-CM | POA: Diagnosis not present

## 2018-04-16 DIAGNOSIS — R739 Hyperglycemia, unspecified: Secondary | ICD-10-CM | POA: Diagnosis present

## 2018-04-16 DIAGNOSIS — Z9012 Acquired absence of left breast and nipple: Secondary | ICD-10-CM

## 2018-04-16 DIAGNOSIS — Z2989 Encounter for other specified prophylactic measures: Secondary | ICD-10-CM

## 2018-04-16 DIAGNOSIS — R1312 Dysphagia, oropharyngeal phase: Secondary | ICD-10-CM

## 2018-04-16 DIAGNOSIS — R32 Unspecified urinary incontinence: Secondary | ICD-10-CM | POA: Diagnosis present

## 2018-04-16 DIAGNOSIS — I10 Essential (primary) hypertension: Secondary | ICD-10-CM

## 2018-04-16 DIAGNOSIS — Z298 Encounter for other specified prophylactic measures: Secondary | ICD-10-CM

## 2018-04-16 DIAGNOSIS — Z79899 Other long term (current) drug therapy: Secondary | ICD-10-CM

## 2018-04-16 DIAGNOSIS — R7401 Elevation of levels of liver transaminase levels: Secondary | ICD-10-CM

## 2018-04-16 DIAGNOSIS — R7303 Prediabetes: Secondary | ICD-10-CM

## 2018-04-16 DIAGNOSIS — Z6832 Body mass index (BMI) 32.0-32.9, adult: Secondary | ICD-10-CM | POA: Diagnosis not present

## 2018-04-16 DIAGNOSIS — D638 Anemia in other chronic diseases classified elsewhere: Secondary | ICD-10-CM | POA: Diagnosis present

## 2018-04-16 DIAGNOSIS — F17210 Nicotine dependence, cigarettes, uncomplicated: Secondary | ICD-10-CM | POA: Diagnosis present

## 2018-04-16 DIAGNOSIS — I69020 Aphasia following nontraumatic subarachnoid hemorrhage: Secondary | ICD-10-CM | POA: Diagnosis not present

## 2018-04-16 DIAGNOSIS — R740 Nonspecific elevation of levels of transaminase and lactic acid dehydrogenase [LDH]: Secondary | ICD-10-CM

## 2018-04-16 DIAGNOSIS — C50919 Malignant neoplasm of unspecified site of unspecified female breast: Secondary | ICD-10-CM | POA: Diagnosis present

## 2018-04-16 DIAGNOSIS — K219 Gastro-esophageal reflux disease without esophagitis: Secondary | ICD-10-CM | POA: Diagnosis present

## 2018-04-16 DIAGNOSIS — R7309 Other abnormal glucose: Secondary | ICD-10-CM

## 2018-04-16 DIAGNOSIS — R1310 Dysphagia, unspecified: Secondary | ICD-10-CM | POA: Diagnosis present

## 2018-04-16 LAB — BASIC METABOLIC PANEL
ANION GAP: 11 (ref 5–15)
BUN: 7 mg/dL (ref 6–20)
CHLORIDE: 102 mmol/L (ref 98–111)
CO2: 27 mmol/L (ref 22–32)
Calcium: 9.2 mg/dL (ref 8.9–10.3)
Creatinine, Ser: 0.63 mg/dL (ref 0.44–1.00)
GFR calc non Af Amer: >60 mL/min (ref >60)
Glucose, Bld: 106 mg/dL — ABNORMAL HIGH (ref 70–99)
POTASSIUM: 3.7 mmol/L (ref 3.5–5.1)
SODIUM: 140 mmol/L (ref 135–145)

## 2018-04-16 LAB — CBC
HCT: 28.4 % — ABNORMAL LOW (ref 36.0–46.0)
HEMOGLOBIN: 8.5 g/dL — AB (ref 12.0–15.0)
MCH: 30.9 pg (ref 26.0–34.0)
MCHC: 29.9 g/dL — ABNORMAL LOW (ref 30.0–36.0)
MCV: 103.3 fL — ABNORMAL HIGH (ref 78.0–100.0)
Platelets: 182 10*3/uL (ref 150–400)
RBC: 2.75 MIL/uL — AB (ref 3.87–5.11)
RDW: 19.5 % — ABNORMAL HIGH (ref 11.5–15.5)
WBC: 6.6 10*3/uL (ref 4.0–10.5)

## 2018-04-16 LAB — GLUCOSE, CAPILLARY
GLUCOSE-CAPILLARY: 108 mg/dL — AB (ref 70–99)
GLUCOSE-CAPILLARY: 107 mg/dL — AB (ref 70–99)

## 2018-04-16 MED ORDER — PHENYTOIN 125 MG/5ML PO SUSP
100.0000 mg | Freq: Three times a day (TID) | ORAL | Status: DC
  Administered 2018-04-16 – 2018-04-19 (×8): 100 mg via ORAL
  Filled 2018-04-16 (×11): qty 4

## 2018-04-16 MED ORDER — QUETIAPINE FUMARATE 25 MG PO TABS
25.0000 mg | ORAL_TABLET | Freq: Every day | ORAL | Status: DC
  Administered 2018-04-17 – 2018-04-21 (×5): 25 mg via ORAL
  Filled 2018-04-16 (×6): qty 1

## 2018-04-16 MED ORDER — GERHARDT'S BUTT CREAM: 1.0000 "application " | TOPICAL_CREAM | Freq: Four times a day (QID) | CUTANEOUS | Status: DC

## 2018-04-16 MED ORDER — ENSURE ENLIVE PO LIQD: 237.0000 mL | Freq: Two times a day (BID) | ORAL | Status: DC

## 2018-04-16 MED ORDER — LEVETIRACETAM 100 MG/ML PO SOLN
750.0000 mg | Freq: Two times a day (BID) | ORAL | Status: DC
  Administered 2018-04-16 – 2018-04-19 (×6): 750 mg via ORAL
  Filled 2018-04-16 (×8): qty 7.5

## 2018-04-16 MED ORDER — LEVETIRACETAM 750 MG PO TABS: 750.0000 mg | ORAL_TABLET | Freq: Two times a day (BID) | ORAL | Status: DC

## 2018-04-16 MED ORDER — RESOURCE THICKENUP CLEAR PO POWD: ORAL | Status: DC | PRN

## 2018-04-16 MED ORDER — ENOXAPARIN SODIUM 30 MG/0.3ML ~~LOC~~ SOLN: 30.0000 mg | SUBCUTANEOUS | Status: DC

## 2018-04-16 MED ORDER — FAMOTIDINE 20 MG PO TABS: 20.0000 mg | ORAL_TABLET | Freq: Every day | ORAL | Status: DC

## 2018-04-16 MED ORDER — QUETIAPINE FUMARATE 25 MG PO TABS: 25.0000 mg | ORAL_TABLET | Freq: Every day | ORAL | Status: DC

## 2018-04-16 MED ORDER — ENOXAPARIN SODIUM 30 MG/0.3ML ~~LOC~~ SOLN: 40.0000 mg | SUBCUTANEOUS | Status: DC

## 2018-04-16 MED ORDER — ORAL CARE MOUTH RINSE: 15.0000 mL | Freq: Two times a day (BID) | OROMUCOSAL | 0 refills | Status: DC

## 2018-04-16 MED ORDER — CHOLECALCIFEROL 400 UNIT/ML PO LIQD
800.0000 [IU] | Freq: Every day | ORAL | Status: DC
  Administered 2018-04-17 – 2018-04-19 (×3): 800 [IU] via ORAL
  Filled 2018-04-16 (×3): qty 2

## 2018-04-16 MED ORDER — ONDANSETRON HCL 4 MG/2ML IJ SOLN: 4.0000 mg | Freq: Four times a day (QID) | INTRAMUSCULAR | Status: DC | PRN

## 2018-04-16 MED ORDER — ACETAMINOPHEN 325 MG PO TABS
325.0000 mg | ORAL_TABLET | ORAL | Status: DC | PRN
  Administered 2018-04-20 (×2): 650 mg via ORAL
  Filled 2018-04-16 (×2): qty 2

## 2018-04-16 MED ORDER — AMLODIPINE BESYLATE 10 MG PO TABS: 10.0000 mg | ORAL_TABLET | Freq: Every day | ORAL | Status: DC

## 2018-04-16 MED ORDER — LABETALOL HCL 100 MG PO TABS: 100.0000 mg | ORAL_TABLET | Freq: Three times a day (TID) | ORAL | Status: DC

## 2018-04-16 MED ORDER — GERHARDT'S BUTT CREAM
TOPICAL_CREAM | Freq: Four times a day (QID) | CUTANEOUS | Status: DC
  Administered 2018-04-16: 1 via TOPICAL
  Administered 2018-04-17 – 2018-04-22 (×15): via TOPICAL

## 2018-04-16 MED ORDER — FAMOTIDINE 20 MG PO TABS
20.0000 mg | ORAL_TABLET | Freq: Every day | ORAL | Status: DC
  Administered 2018-04-17 – 2018-04-22 (×6): 20 mg via ORAL
  Filled 2018-04-16 (×6): qty 1

## 2018-04-16 MED ORDER — LOPERAMIDE HCL 1 MG/7.5ML PO SUSP
2.0000 mg | Freq: Four times a day (QID) | ORAL | Status: DC
  Administered 2018-04-18: 2 mg via ORAL
  Filled 2018-04-16 (×9): qty 15

## 2018-04-16 MED ORDER — CARVEDILOL 25 MG PO TABS
25.0000 mg | ORAL_TABLET | Freq: Two times a day (BID) | ORAL | Status: DC
  Administered 2018-04-16 – 2018-04-22 (×12): 25 mg via ORAL
  Filled 2018-04-16 (×12): qty 1

## 2018-04-16 MED ORDER — ONDANSETRON 4 MG PO TBDP: 4.0000 mg | ORAL_TABLET | Freq: Four times a day (QID) | ORAL | Status: DC | PRN

## 2018-04-16 MED ORDER — PHENYTOIN SODIUM EXTENDED 100 MG PO CAPS: 100.0000 mg | ORAL_CAPSULE | Freq: Three times a day (TID) | ORAL | Status: DC

## 2018-04-16 MED ORDER — ENOXAPARIN SODIUM 30 MG/0.3ML ~~LOC~~ SOLN
30.0000 mg | SUBCUTANEOUS | Status: DC
  Administered 2018-04-17 – 2018-04-18 (×2): 30 mg via SUBCUTANEOUS
  Filled 2018-04-16 (×3): qty 0.3

## 2018-04-16 MED ORDER — LABETALOL HCL 100 MG PO TABS
100.0000 mg | ORAL_TABLET | Freq: Three times a day (TID) | ORAL | Status: DC
  Administered 2018-04-17 – 2018-04-22 (×15): 100 mg via ORAL
  Filled 2018-04-16 (×17): qty 1

## 2018-04-16 MED ORDER — AMLODIPINE BESYLATE 10 MG PO TABS
10.0000 mg | ORAL_TABLET | Freq: Every day | ORAL | Status: DC
  Administered 2018-04-17 – 2018-04-22 (×6): 10 mg via ORAL
  Filled 2018-04-16 (×6): qty 1

## 2018-04-16 NOTE — H&P (Signed)
Physical Medicine and Rehabilitation Admission H&P     HPI: Diane Short is a 62 year old right-handed female who is married reportedly independent up until few weeks ago with complicated medical history to include progressive stage IV progressive breast cancer followed by Dr. Hinton Rao with current completion of second cycle of chemotherapy for palliative management.  Patient recently had radiation stereotactic surgery for single solitary brain met as well as history of 5 intracerebral aneurysms 3 of which treated endovascularly.  Noted progressive decline over the last few weeks with intermittent headaches.  She was seen by EMS 03/22/2018 taken to [facility] for unresponsive episode requiring intubation.  CT scan of the head showed subarachnoid hemorrhage and subsequently transferred to [facility] for further treatment.  EEG completed showing general background slowing and no seizure activity.  Echocardiogram with ejection fraction of 58% grade 1 diastolic dysfunction.  CT angiogram of head and neck showed increased volume of subarachnoid hemorrhage predominantly located in the basal cisterns and sylvian fissures with intraventricular extension in a pattern most consistent with aneurysmal hemorrhage.  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 Kathyrn Sheriff.  She remained intubated until 04/03/2018.  Maintained on Dilantin/Keppra for seizure prophylaxis.Nimotop for blood pressure control which has been completed.  Subcutaneous.  Lovenox initiated for DVT prophylaxis 03/31/2018.  She is on a dysphagia #2 nectar thick liquid diet.  Hospital course palliative care follow-up therapy evaluations completed with recommendations of physical medicine rehab consult.  Patient was admitted for a comprehensive rehab program.  Review of Systems  Constitutional: Negative for chills and fever.  HENT: Negative for hearing loss.   Eyes: Negative for blurred vision and  double vision.  Respiratory: Positive for shortness of breath. Negative for cough.   Cardiovascular: Positive for leg swelling. Negative for chest pain and palpitations.  Gastrointestinal: Positive for constipation. Negative for nausea.       GERD  Genitourinary: Negative for dysuria, flank pain and hematuria.  Musculoskeletal: Positive for myalgias.  Skin: Negative for rash.  Neurological: Positive for focal weakness and headaches.  All other systems reviewed and are negative.  Past Medical History:  Diagnosis Date  . 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 Anxiety   . Arthritis   . Cancer [facility])    breast cancer  . Cerebral hemorrhage ([place]) 1986  . Depression    due to cancer dx  . Diverticulosis   . GERD (gastroesophageal reflux disease)   . Headache   . History of hiatal hernia   . Hypertension   . 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 Neutropenic fever ([place])    Chemo related  . Osteonecrosis ([place]) 2017-2018   Right Jaw bone  . Thrush, oral    Chemo related   Past Surgical History:  Procedure Laterality Date  . BREAST SURGERY Left 11/2016   metastic to bone  . CATARACT EXTRACTION Right 03/02/2017  . CEREBRAL ANGIOGRAM  1986  . CESAREAN SECTION  1980  . IR 3D INDEPENDENT WKST  01/20/2017  . IR 3D INDEPENDENT WKST  05/26/2017  . IR ANGIO INTRA EXTRACRAN SEL COM CAROTID INNOMINATE UNI R MOD SED  04/09/2017  . IR ANGIO INTRA EXTRACRAN SEL INTERNAL CAROTID BILAT MOD SED  01/20/2017  . IR ANGIO INTRA EXTRACRAN SEL INTERNAL CAROTID BILAT MOD SED  11/17/2017  . IR ANGIO INTRA EXTRACRAN SEL INTERNAL CAROTID BILAT MOD SED  03/23/2018  . IR ANGIO INTRA EXTRACRAN SEL INTERNAL CAROTID UNI  R MOD SED  03/13/2017  . IR ANGIO VERTEBRAL SEL VERTEBRAL BILAT MOD SED  01/20/2017  . IR ANGIO VERTEBRAL SEL VERTEBRAL UNI L MOD SED  05/26/2017  . IR ANGIO VERTEBRAL SEL VERTEBRAL UNI L MOD SED  11/17/2017  . IR ANGIOGRAM FOLLOW UP STUDY  03/13/2017  . IR ANGIOGRAM FOLLOW UP STUDY  03/13/2017  . IR ANGIOGRAM FOLLOW UP  STUDY  03/13/2017  . IR ANGIOGRAM FOLLOW UP STUDY  03/13/2017  . IR ANGIOGRAM FOLLOW UP STUDY  03/13/2017  . IR ANGIOGRAM FOLLOW UP STUDY  03/13/2017  . IR ANGIOGRAM FOLLOW UP STUDY  03/13/2017  . IR ANGIOGRAM FOLLOW UP STUDY  03/13/2017  . IR ANGIOGRAM FOLLOW UP STUDY  03/13/2017  . IR ANGIOGRAM FOLLOW UP STUDY  03/13/2017  . IR ANGIOGRAM FOLLOW UP STUDY  03/13/2017  . IR ANGIOGRAM FOLLOW UP STUDY  03/13/2017  . IR ANGIOGRAM FOLLOW UP STUDY  04/09/2017  . IR ANGIOGRAM FOLLOW UP STUDY  05/26/2017  . IR ANGIOGRAM FOLLOW UP STUDY  05/26/2017  . IR ANGIOGRAM FOLLOW UP STUDY  03/23/2018  . IR NEURO EACH ADD'L AFTER BASIC UNI LEFT (MS)  04/09/2017  . IR NEURO EACH ADD'L AFTER BASIC UNI LEFT (MS)  03/23/2018  . IR NEURO EACH ADD'L AFTER BASIC UNI RIGHT (MS)  03/13/2017  . IR TRANSCATH/EMBOLIZ  03/13/2017  . IR TRANSCATH/EMBOLIZ  04/09/2017  . IR TRANSCATH/EMBOLIZ  05/26/2017  . IR TRANSCATH/EMBOLIZ  03/23/2018  . IR US GUIDE VASC ACCESS RIGHT  04/09/2017  . MASTECTOMY Left   . port a cathh  04/2016  . RADIOLOGY WITH ANESTHESIA N/A 03/13/2017   Procedure: stent coiling of right MCA aneurysm;  Surgeon: Consuella Lose, MD;  Location: [place];  Service: Radiology;  Laterality: N/A;  . RADIOLOGY WITH ANESTHESIA N/A 04/09/2017   Procedure: Arteriogram, pipeline embolization of aneruysm;  Surgeon: Consuella Lose, MD;  Location: [place];  Service: Radiology;  Laterality: N/A;  . RADIOLOGY WITH ANESTHESIA N/A 05/26/2017   Procedure: Stent supported coil embolization;  Surgeon: Consuella Lose, MD;  Location: [place];  Service: Radiology;  Laterality: N/A;  . RADIOLOGY WITH ANESTHESIA N/A 03/23/2018   Procedure: IR WITH ANESTHESIA;  Surgeon: Consuella Lose, MD;  Location: [place];  Service: Radiology;  Laterality: N/A;  . TUBAL LIGATION  1983   Family History  Problem Relation Age of Onset  . Alzheimer's disease Mother   . Colon cancer Father   . Cerebral aneurysm Father   . Diabetes Sister   . Stomach  cancer Brother   . Esophageal cancer Brother   . Multiple myeloma Brother   . Hypertension Sister    Social History:  reports that she has been smoking cigarettes. She has a 21.00 pack-year smoking history. She has never used smokeless tobacco. She reports that she does not drink alcohol or use drugs. Allergies:  Allergies  Allergen Reactions  . Nsaids Other (See Comments)    GI upset   . Ace Inhibitors Other (See Comments)    cough   Medications Prior to Admission  Medication Sig Dispense Refill  . acetaminophen (TYLENOL) 500 MG tablet Take 1,000 mg by mouth 2 (two) times daily as needed for mild pain.     Marland Kitchen aspirin 81 MG tablet Take 81 mg by mouth daily.     . Calcium Citrate-Vitamin D (CALCIUM CITRATE + D3 PO) Take 1-2 tablets by mouth 2 (two) times daily. Takes 2 in the am and 1 in the evening    .  carvedilol (COREG) 25 MG tablet Take 25 mg by mouth 2 (two) times daily with a meal.    . citalopram (CELEXA) 20 MG tablet Take 20 mg by mouth daily.    . diphenhydrAMINE (BENADRYL) 25 MG tablet Take 25 mg by mouth at bedtime as needed for sleep.    Marland Kitchen loperamide (IMODIUM A-D) 2 MG tablet Take 2-4 mg by mouth daily as needed for diarrhea or loose stools.    Marland Kitchen loratadine (CLARITIN) 10 MG tablet Take 10 mg by mouth daily.    Marland Kitchen LORazepam (ATIVAN) 0.5 MG tablet Take 0.5 mg by mouth every 4 (four) hours as needed for anxiety.    Marland Kitchen omeprazole (PRILOSEC) 20 MG capsule Take 20 mg by mouth daily.    . ondansetron (ZOFRAN) 4 MG tablet Take 4 mg by mouth every 4 (four) hours as needed for nausea or vomiting.    . prochlorperazine (COMPAZINE) 10 MG tablet Take 10 mg by mouth every 6 (six) hours as needed for nausea or vomiting.    . traMADol (ULTRAM) 50 MG tablet Take 50 mg by mouth every 6 (six) hours as needed for moderate pain.      Drug Regimen Review Drug regimen was reviewed and remains appropriate with no significant issues identified  Home: Home Living Family/patient expects to be  discharged to:: Private residence Living Arrangements: Spouse/significant other Available Help at Discharge: Family, Available 24 hours/day Type of Home: House Home Access: Level entry Home Layout: One level, Other (Comment)(1 step to den/kitchen) Bathroom Shower/Tub: Tub/shower unit, Architectural technologist: Standard Bathroom Accessibility: Yes Home Equipment: Grab bars - tub/shower  Lives With: Spouse   Functional History: Prior Function Level of Independence: Independent Comments: drove; husband occasionally helpes with drying off legs due to unsteadiness  Functional Status:  Mobility: Bed Mobility Overal bed mobility: Needs Assistance Bed Mobility: Rolling, Sidelying to Sit Rolling: Mod assist Sidelying to sit: Mod assist Supine to sit: Total assist, +2 for physical assistance Sit to supine: Min assist Sit to sidelying: Total assist, +2 for physical assistance General bed mobility comments: assist to initiate task, and assist to lift trunk.  Pt spontanseously lowered trunk to bed and required assist to fully lift LEs onto bed  Transfers Overall transfer level: Needs assistance Equipment used: Ambulation equipment used Transfer via Lift Equipment: Stedy Transfers: Sit to/from Stand Sit to Stand: Mod assist, +2 physical assistance Squat pivot transfers: Total assist, +2 physical assistance General transfer comment: assist to power up into standing from bed, and min A to stand from stedy.  Did not transfer pt due to pt had just returned to bed after sitting up in recliner for extended period of time.  Stood x5 working on erect posture Ambulation/Gait Ambulation/Gait assistance: (NT) General Gait Details: not able    ADL: ADL Overall ADL's : Needs assistance/impaired Eating/Feeding: Maximal assistance Grooming: Oral care, Wash/dry face, Maximal assistance, Sitting Grooming Details (indicate cue type and reason): Pt able to comb the front portion of her hair, but requires  assist to complete task  Upper Body Bathing: Maximal assistance, Bed level Lower Body Bathing: Maximal assistance, Sit to/from stand Upper Body Dressing : Maximal assistance, Sitting Lower Body Dressing: Total assistance, Bed level Toilet Transfer: Moderate assistance, +2 for physical assistance, +2 for safety/equipment, BSC Toilet Transfer Details (indicate cue type and reason): using stedy  Toileting- Clothing Manipulation and Hygiene: Maximal assistance, Sit to/from stand Toileting - Clothing Manipulation Details (indicate cue type and reason): incontinent of BM; total A to clean;  apparent moisture assisted skin damage Functional mobility during ADLs: Moderate assistance, +2 for physical assistance, +2 for safety/equipment General ADL Comments: total A all ADL  Cognition: Cognition Overall Cognitive Status: Impaired/Different from baseline Arousal/Alertness: Awake/alert Orientation Level: Disoriented to situation, Oriented to person, Oriented to place, Oriented to time Attention: Focused, Sustained Focused Attention: Appears intact Sustained Attention: Impaired Sustained Attention Impairment: Verbal basic, Functional basic Memory: Impaired Memory Impairment: Storage deficit Awareness: Impaired Awareness Impairment: Emergent impairment Problem Solving: Impaired Problem Solving Impairment: Verbal basic, Functional basic Executive Function: Self Monitoring, Initiating Initiating: Impaired Initiating Impairment: Verbal basic, Functional basic Self Monitoring: Impaired Self Monitoring Impairment: Verbal basic, Functional basic Cognition Arousal/Alertness: Awake/alert Behavior During Therapy: Flat affect, WFL for tasks assessed/performed Overall Cognitive Status: Impaired/Different from baseline Area of Impairment: Orientation, Attention, Memory, Following commands, Safety/judgement, Awareness, Problem solving Orientation Level: Disoriented to, Time, Place Current Attention Level:  Sustained, Selective Memory: Decreased short-term memory Following Commands: Follows one step commands consistently, Follows multi-step commands inconsistently, Follows multi-step commands with increased time Safety/Judgement: Decreased awareness of safety, Decreased awareness of deficits Awareness: Intellectual Problem Solving: Slow processing, Decreased initiation, Difficulty sequencing, Requires tactile cues, Requires verbal cues General Comments: Pt intially lethargic, but as session progressed, pt became more animated.  She demonstrates a delay in processing and initiation, but by end of session, she attempted to tell a complex joke and did so with 75% accuracy (per spouse).     Physical Exam: Blood pressure (!) 159/87, pulse 94, temperature 98.1 F (36.7 C), temperature source Oral, resp. rate 15, height '5\' 4"'  (1.626 m), weight 102 kg, last menstrual period 08/14/2010, SpO2 99 %. Physical Exam  Vitals reviewed. Constitutional: No distress.  HENT:  Head: Normocephalic.  Eyes: Pupils are equal, round, and reactive to light. Conjunctivae are normal. Right eye exhibits no discharge. Left eye exhibits no discharge.  Neck: Normal range of motion.  Cardiovascular: Normal rate. Exam reveals no gallop and no friction rub.  No murmur heard. Respiratory: Effort normal and breath sounds normal. No respiratory distress. She has no wheezes. She has no rales.  GI: Soft. She exhibits no distension. There is no tenderness.  Musculoskeletal: Normal range of motion.  Neurological:  Patient is alert.  Sitting up in bed.  Makes good eye contact with examiner.  Follows full commands.  She does initiate conversation and provide her name, age and date of birth. Able to ID simple objects but delays with processing and more complex language. Mild right hemiparesis. RUE 3-4/5. LUE 4/5. RLE 3-4/5. LLE 4-/5. Senses pain in all 4's.   Skin: Skin is warm.  Psychiatric:  Pleasant and cooperative    Results for  orders placed or performed during the hospital encounter of 03/22/18 (from the past 48 hour(s))  Glucose, capillary     Status: Abnormal   Collection Time: 04/14/18  4:26 PM  Result Value Ref Range   Glucose-Capillary 108 (H) 70 - 99 mg/dL  Glucose, capillary     Status: Abnormal   Collection Time: 04/14/18  7:52 PM  Result Value Ref Range   Glucose-Capillary 136 (H) 70 - 99 mg/dL  Glucose, capillary     Status: Abnormal   Collection Time: 04/14/18 11:35 PM  Result Value Ref Range   Glucose-Capillary 114 (H) 70 - 99 mg/dL  Glucose, capillary     Status: Abnormal   Collection Time: 04/15/18  4:24 AM  Result Value Ref Range   Glucose-Capillary 103 (H) 70 - 99 mg/dL  Basic metabolic panel  Status: Abnormal   Collection Time: 04/15/18  5:31 AM  Result Value Ref Range   Sodium 138 135 - 145 mmol/L   Potassium 3.6 3.5 - 5.1 mmol/L   Chloride 105 98 - 111 mmol/L   CO2 26 22 - 32 mmol/L   Glucose, Bld 105 (H) 70 - 99 mg/dL   BUN 11 6 - 20 mg/dL   Creatinine, Ser 0.58 0.44 - 1.00 mg/dL   Calcium 8.6 (L) 8.9 - 10.3 mg/dL   GFR calc non Af Amer >60 >60 mL/min   GFR calc Af Amer >60 >60 mL/min    Comment: (NOTE) The eGFR has been calculated using the CKD EPI equation. This calculation has not been validated in all clinical situations. eGFR's persistently <60 mL/min signify possible Chronic Kidney Disease.    Anion gap 7 5 - 15    Comment: Performed at [facility] [address], [place], [place] [address]  CBC     Status: Abnormal   Collection Time: 04/15/18  5:31 AM  Result Value Ref Range   WBC 5.8 4.0 - 10.5 K/uL   RBC 2.74 (L) 3.87 - 5.11 MIL/uL   Hemoglobin 8.4 (L) 12.0 - 15.0 g/dL   HCT 28.5 (L) 36.0 - 46.0 %   MCV 104.0 (H) 78.0 - 100.0 fL   MCH 30.7 26.0 - 34.0 pg   MCHC 29.5 (L) 30.0 - 36.0 g/dL   RDW 19.9 (H) 11.5 - 15.5 %   Platelets 169 150 - 400 K/uL    Comment: Performed at [facility], [place] [address], [place], [place] [address]  Magnesium      Status: None   Collection Time: 04/15/18  5:31 AM  Result Value Ref Range   Magnesium 2.3 1.7 - 2.4 mg/dL    Comment: Performed at [facility] [address] [place], [place] [address]  Glucose, capillary     Status: Abnormal   Collection Time: 04/15/18 11:39 AM  Result Value Ref Range   Glucose-Capillary 108 (H) 70 - 99 mg/dL  CBC     Status: Abnormal   Collection Time: 04/16/18  6:00 AM  Result Value Ref Range   WBC 6.6 4.0 - 10.5 K/uL   RBC 2.75 (L) 3.87 - 5.11 MIL/uL   Hemoglobin 8.5 (L) 12.0 - 15.0 g/dL   HCT 28.4 (L) 36.0 - 46.0 %   MCV 103.3 (H) 78.0 - 100.0 fL   MCH 30.9 26.0 - 34.0 pg   MCHC 29.9 (L) 30.0 - 36.0 g/dL   RDW 19.5 (H) 11.5 - 15.5 %   Platelets 182 150 - 400 K/uL    Comment: Performed at [facility], [place] [address] [place], [place] [address]  Basic metabolic panel     Status: Abnormal   Collection Time: 04/16/18  6:00 AM  Result Value Ref Range   Sodium 140 135 - 145 mmol/L   Potassium 3.7 3.5 - 5.1 mmol/L   Chloride 102 98 - 111 mmol/L   CO2 27 22 - 32 mmol/L   Glucose, Bld 106 (H) 70 - 99 mg/dL   BUN 7 6 - 20 mg/dL   Creatinine, Ser 0.63 0.44 - 1.00 mg/dL   Calcium 9.2 8.9 - 10.3 mg/dL   GFR calc non Af Amer >60 >60 mL/min   GFR calc Af Amer >60 >60 mL/min    Comment: (NOTE) The eGFR has been calculated using the CKD EPI equation. This calculation has not been  validated in all clinical situations. eGFR's persistently <60 mL/min signify possible Chronic Kidney Disease.    Anion gap 11 5 - 15    Comment: Performed at [facility] [address], [place], [place] [address]   Dg Swallowing Func-speech Pathology  Result Date: 04/14/2018 Objective Swallowing Evaluation: Type of Study: MBS-Modified Barium Swallow Study  Patient Details Name: Diane Short MRN: [id number] Date of Birth: 11/24/1955 Today's Date: 04/14/2018 Time: SLP Start Time (ACUTE ONLY): 1320 -SLP Stop Time (ACUTE ONLY): 1350 SLP Time Calculation (min) (ACUTE  ONLY): 30 min Past Medical History: Past Medical History: Diagnosis Date . Anemia   due to chemo . Anxiety  . Arthritis  . Cancer [facility])   breast cancer . Cerebral hemorrhage ([place]) 1986 . Depression   due to cancer dx . Diverticulosis  . GERD (gastroesophageal reflux disease)  . Headache  . History of hiatal hernia  . Hypertension  . 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 Neutropenic fever ([place])   Chemo related . Osteonecrosis ([place]) 2017-2018  Right Jaw bone . Thrush, oral   Chemo related Past Surgical History: Past Surgical History: Procedure Laterality Date . BREAST SURGERY Left 11/2016  metastic to bone . CATARACT EXTRACTION Right 03/02/2017 . CEREBRAL ANGIOGRAM  1986 . CESAREAN SECTION  1980 . IR 3D INDEPENDENT WKST  01/20/2017 . IR 3D INDEPENDENT WKST  05/26/2017 . IR ANGIO INTRA EXTRACRAN SEL COM CAROTID INNOMINATE UNI R MOD SED  04/09/2017 . IR ANGIO INTRA EXTRACRAN SEL INTERNAL CAROTID BILAT MOD SED  01/20/2017 . IR ANGIO INTRA EXTRACRAN SEL INTERNAL CAROTID BILAT MOD SED  11/17/2017 . IR ANGIO INTRA EXTRACRAN SEL INTERNAL CAROTID BILAT MOD SED  03/23/2018 . IR ANGIO INTRA EXTRACRAN SEL INTERNAL CAROTID UNI R MOD SED  03/13/2017 . IR ANGIO VERTEBRAL SEL VERTEBRAL BILAT MOD SED  01/20/2017 . IR ANGIO VERTEBRAL SEL VERTEBRAL UNI L MOD SED  05/26/2017 . IR ANGIO VERTEBRAL SEL VERTEBRAL UNI L MOD SED  11/17/2017 . IR ANGIOGRAM FOLLOW UP STUDY  03/13/2017 . IR ANGIOGRAM FOLLOW UP STUDY  03/13/2017 . IR ANGIOGRAM FOLLOW UP STUDY  03/13/2017 . IR ANGIOGRAM FOLLOW UP STUDY  03/13/2017 . IR ANGIOGRAM FOLLOW UP STUDY  03/13/2017 . IR ANGIOGRAM FOLLOW UP STUDY  03/13/2017 . IR ANGIOGRAM FOLLOW UP STUDY  03/13/2017 . IR ANGIOGRAM FOLLOW UP STUDY  03/13/2017 . IR ANGIOGRAM FOLLOW UP STUDY  03/13/2017 . IR ANGIOGRAM FOLLOW UP STUDY  03/13/2017 . IR ANGIOGRAM FOLLOW UP STUDY  03/13/2017 . IR ANGIOGRAM FOLLOW UP STUDY  03/13/2017 . IR ANGIOGRAM FOLLOW UP STUDY  04/09/2017 . IR ANGIOGRAM FOLLOW UP STUDY  05/26/2017 . IR ANGIOGRAM FOLLOW UP STUDY  05/26/2017 .  IR ANGIOGRAM FOLLOW UP STUDY  03/23/2018 . IR NEURO EACH ADD'L AFTER BASIC UNI LEFT (MS)  04/09/2017 . IR NEURO EACH ADD'L AFTER BASIC UNI LEFT (MS)  03/23/2018 . IR NEURO EACH ADD'L AFTER BASIC UNI RIGHT (MS)  03/13/2017 . IR TRANSCATH/EMBOLIZ  03/13/2017 . IR TRANSCATH/EMBOLIZ  04/09/2017 . IR TRANSCATH/EMBOLIZ  05/26/2017 . IR TRANSCATH/EMBOLIZ  03/23/2018 . IR US GUIDE VASC ACCESS RIGHT  04/09/2017 . MASTECTOMY Left  . port a cathh  04/2016 . RADIOLOGY WITH ANESTHESIA N/A 03/13/2017  Procedure: stent coiling of right MCA aneurysm;  Surgeon: Consuella Lose, MD;  Location: [place];  Service: Radiology;  Laterality: N/A; . RADIOLOGY WITH ANESTHESIA N/A 04/09/2017  Procedure: Arteriogram, pipeline embolization of aneruysm;  Surgeon: Consuella Lose, MD;  Location: [place];  Service: Radiology;  Laterality:  N/A; . RADIOLOGY WITH ANESTHESIA N/A 05/26/2017  Procedure: Stent supported coil embolization;  Surgeon: Consuella Lose, MD;  Location: [place];  Service: Radiology;  Laterality: N/A; . RADIOLOGY WITH ANESTHESIA N/A 03/23/2018  Procedure: IR WITH ANESTHESIA;  Surgeon: Consuella Lose, MD;  Location: [place];  Service: Radiology;  Laterality: N/A; . TUBAL LIGATION  1983 HPI: 62 yo female hx smoking, stage 4 breast cancer with brain met tx with chemo and brain XRT, cerebral aneurysms s/p intervention presented with persistent headache, and altered mental status. Found to have stable intraventricular hemorrhage as well as subarachnoid hemorrhage concentrated in left prepontine cistern and left CP angle cistern. Intubated 9/9-9/11 and re-intubated 9/11-9/21. CXR 9/22 mild patchy/platelike lower lobe opacities, likely atelectasis. Multiple attempts at NGT unsuccessful and pt has crucial po medication.   Subjective: alert, follows commands Assessment / Plan / Recommendation CHL IP CLINICAL IMPRESSIONS 04/14/2018 Clinical Impression Pt presents with improved oropharyngeal swallow, due in large measure to improved cognition  (level of alertness, spontaneity, response time).  Demonstrates mild oral delays but with active bolus manipulation and improved control in general.  Purees/soft solids reach just below valleculae at time of swallow trigger; the head of liquid boluses reaches pyriforms when swallow is triggered - for liquids, this leads to penetration of nectars before the swallow (PAS #2, ejected from larynx upon completion of swallow), and sensed aspiration of thin liquids.  Pt continues to aspirate posteriorly into trachea (trace amounts), likely still attributable to glottal incompetence s/p prolonged intubation.  Compensatory postures (head turns, chin tuck) had no notable effect on airway protection with thin liquids. There was trace vallecular residue post-swallow. Given pt's motivation to eat, recommend resuming PO diet - dysphagia 2, nectar-thick liquids; meds whole in puree.  SLP will follow for safety/diet progression. Pt's spouse was present for exam and agrees with plan. SLP Visit Diagnosis Dysphagia, oropharyngeal phase (R13.12) Attention and concentration deficit following -- Frontal lobe and executive function deficit following -- Impact on safety and function Mild aspiration risk   CHL IP TREATMENT RECOMMENDATION 04/14/2018 Treatment Recommendations Therapy as outlined in treatment plan below   Prognosis 04/14/2018 Prognosis for Safe Diet Advancement Good Barriers to Reach Goals -- Barriers/Prognosis Comment -- CHL IP DIET RECOMMENDATION 04/14/2018 SLP Diet Recommendations Dysphagia 2 (Fine chop) solids;Nectar thick liquid Liquid Administration via Cup;Straw Medication Administration Whole meds with puree Compensations Slow rate;Small sips/bites Postural Changes Remain semi-upright after after feeds/meals (Comment)   CHL IP OTHER RECOMMENDATIONS 04/14/2018 Recommended Consults -- Oral Care Recommendations Oral care BID Other Recommendations --   CHL IP FOLLOW UP RECOMMENDATIONS 04/14/2018 Follow up Recommendations  Inpatient Rehab   CHL IP FREQUENCY AND DURATION 04/14/2018 Speech Therapy Frequency (ACUTE ONLY) min 3x week Treatment Duration 2 weeks      CHL IP ORAL PHASE 04/14/2018 Oral Phase Impaired Oral - Pudding Teaspoon -- Oral - Pudding Cup -- Oral - Honey Teaspoon Delayed oral transit Oral - Honey Cup -- Oral - Nectar Teaspoon WFL Oral - Nectar Cup WFL Oral - Nectar Straw WFL Oral - Thin Teaspoon WFL Oral - Thin Cup Decreased bolus cohesion Oral - Thin Straw Decreased bolus cohesion Oral - Puree Delayed oral transit Oral - Mech Soft Delayed oral transit Oral - Regular -- Oral - Multi-Consistency -- Oral - Pill -- Oral Phase - Comment --  CHL IP PHARYNGEAL PHASE 04/14/2018 Pharyngeal Phase Impaired Pharyngeal- Pudding Teaspoon -- Pharyngeal -- Pharyngeal- Pudding Cup -- Pharyngeal -- Pharyngeal- Honey Teaspoon Delayed swallow initiation-vallecula;Pharyngeal residue - valleculae Pharyngeal --  Pharyngeal- Honey Cup -- Pharyngeal -- Pharyngeal- Nectar Teaspoon Delayed swallow initiation-vallecula;Penetration/Aspiration before swallow;Pharyngeal residue - valleculae Pharyngeal Material enters airway, remains ABOVE vocal cords then ejected out Pharyngeal- Nectar Cup Delayed swallow initiation-vallecula;Penetration/Aspiration before swallow;Pharyngeal residue - valleculae Pharyngeal Material enters airway, remains ABOVE vocal cords then ejected out Pharyngeal- Nectar Straw Delayed swallow initiation-vallecula;Penetration/Aspiration before swallow;Pharyngeal residue - valleculae Pharyngeal Material enters airway, remains ABOVE vocal cords then ejected out Pharyngeal- Thin Teaspoon Delayed swallow initiation-pyriform sinuses;Penetration/Aspiration before swallow;Penetration/Aspiration during swallow Pharyngeal Material enters airway, remains ABOVE vocal cords and not ejected out Pharyngeal- Thin Cup Delayed swallow initiation-pyriform sinuses;Penetration/Aspiration before swallow;Penetration/Aspiration during swallow;Trace  aspiration Pharyngeal Material enters airway, passes BELOW cords and not ejected out despite cough attempt by patient Pharyngeal- Thin Straw Delayed swallow initiation-pyriform sinuses;Penetration/Aspiration before swallow;Penetration/Aspiration during swallow;Trace aspiration Pharyngeal Material enters airway, passes BELOW cords and not ejected out despite cough attempt by patient Pharyngeal- Puree Delayed swallow initiation-vallecula;Pharyngeal residue - valleculae Pharyngeal -- Pharyngeal- Mechanical Soft Delayed swallow initiation-vallecula;Pharyngeal residue - valleculae Pharyngeal -- Pharyngeal- Regular -- Pharyngeal -- Pharyngeal- Multi-consistency -- Pharyngeal -- Pharyngeal- Pill -- Pharyngeal -- Pharyngeal Comment --  No flowsheet data found. Diane Short 04/14/2018, 3:04 PM                  Medical Problem List and Plan: 1.  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  DVT Prophylaxis/Anticoagulation: Subcutaneous Lovenox.  Monitor for any bleeding episodes 3. Pain Management: Tylenol as needed 4. Mood: Seroquel 25 mg nightly 5. Neuropsych: This patient is capable of making decisions on her own behalf. 6. Skin/Wound Care: Routine skin checks 7. Fluids/Electrolytes/Nutrition: Routine in and outs with follow-up chemistries 8.  Stage IV breast cancer.  Palliative chemotherapy completed.  Follow-up Dr. Hinton Rao  -palliative care following patient 9.  Dysphagia.  Dysphasia #2 nectar liquids 10.  Hypertension.  Norvasc 10 mg daily, Coreg 25 mg twice daily, labetalol 100 mg 3 times daily 11.  Seizure prophylaxis.  Dilantin 100 mg 3 times daily, Keppra 750 mg twice daily   Post Admission Physician Evaluation: 1. Functional deficits secondary  to [facility] with right hemiparesis and aphasia. 2. Patient is admitted to receive collaborative, interdisciplinary care between the physiatrist, rehab nursing staff, and therapy  team. 3. Patient's level of medical complexity and substantial therapy needs in context of that medical necessity cannot be provided at a lesser intensity of care such as a SNF. 4. Patient has experienced substantial functional loss from his/her baseline which was documented above under the "Functional History" and "Functional Status" headings.  Judging by the patient's diagnosis, physical exam, and functional history, the patient has potential for functional progress which will result in measurable gains while on inpatient rehab.  These gains will be of substantial and practical use upon discharge  in facilitating mobility and self-care at the household level. 5. Physiatrist will provide 24 hour management of medical needs as well as oversight of the therapy plan/treatment and provide guidance as appropriate regarding the interaction of the two. 6. The Preadmission Screening has been reviewed and patient status is unchanged unless otherwise stated above. 7. 24 hour rehab nursing will assist with bladder management, bowel management, safety, skin/wound care, disease management, medication administration, pain management and patient education  and help integrate therapy concepts, techniques,education, etc. 8. PT will assess and treat for/with: Lower extremity strength, range of motion, stamina, balance, functional mobility, safety, adaptive techniques and equipment, NMR, family education.   Goals are:  min assist. 9. OT will assess and treat for/with: ADL's, functional mobility, safety, upper extremity strength, adaptive techniques and equipment, NMR, family education.   Goals are:   min assist. Therapy may proceed with showering this patient. 10. SLP will assess and treat for/with: cognition, communication, language  Goals are: min assist. 11. Case Management and Social Worker will assess and treat for psychological issues and discharge planning. 12. Team conference will be held weekly to assess progress  toward goals and to determine barriers to discharge. 13. Patient will receive at least 3 hours of therapy per day at least 5 days per week. 14. ELOS: 5-8 days       15. Prognosis:  good   I have personally performed a face to face diagnostic evaluation of this patient and formulated the key components of the plan.  Additionally, I have personally reviewed laboratory data, imaging studies, as well as relevant notes and concur with the physician assistant's documentation above.  Meredith Staggers, MD, Mell[facility] Drown    Lavon Paganini [place], PA-C 04/16/2018

## 2018-04-16 NOTE — Progress Notes (Signed)
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  Found to have stable intraventricular hemorrhage as well as subarachnoid hemorrhage concentrated in left prepontine cistern and left CP angle cistern.  Intubated 9/9-9/11 and re-intubated 9/11-9/21. CXR 9/22 mild patchy/platelike lower lobe opacities, likely atelectasis.    PT Comments    Patient progressing with mobility slowly.  Was able to stand with RW, but unable to sequence steps nor demonstrate balance or standing endurance enough to take steps.  So did use the stedy.  Feel she is appropriate for CIR level rehab as spouse supportive and able to give assist in the home.  PT to follow until d/c.   Follow Up Recommendations  CIR;Supervision/Assistance - 24 hour     Equipment Recommendations  Other (comment)(tba)    Recommendations for Other Services       Precautions / Restrictions Precautions Precautions: Fall    Mobility  Bed Mobility Overal bed mobility: Needs Assistance       Supine to sit: Mod assist;HOB elevated     General bed mobility comments: able to move her feet to EOB, assist to scoot, used rail  Transfers Overall transfer level: Needs assistance Equipment used: Ambulation equipment used Transfers: Sit to/from Stand Sit to Stand: Mod assist;+2 physical assistance         General transfer comment: from elevated surface stood up pulling up on stedy with increased time to reach railing with dysmetria, lifting assist, cues and assist for anterior weight shift; stood from bed to [facility] then from [facility] with RW stood for hygiene, but LE's fatigued and unable to take steps so sat on [facility] and got to recliner with stedy.    Ambulation/Gait                 Stairs             Wheelchair Mobility    Modified Rankin (Stroke Patients Only) Modified Rankin (Stroke Patients Only) Pre-Morbid Rankin Score: No symptoms Modified Rankin: Severe disability     Balance Overall balance assessment: Needs assistance Sitting-balance support: Feet supported Sitting balance-Leahy Scale: Poor Sitting balance - Comments: cues and minguard for safety due to lateral lean     Standing balance-Leahy Scale: Poor Standing balance comment: posterior lean in standing fatigues quickly, stood approx 40 seconds for hygiene and donning briefs; sit to stand x 3; tremors noted in standing                            Cognition Arousal/Alertness: Awake/alert Behavior During Therapy: WFL for tasks assessed/performed Overall Cognitive Status: Impaired/Different from baseline                     Current Attention Level: Selective   Following Commands: Follows one step commands consistently;Follows one step commands with increased time     Problem Solving: Slow processing;Decreased initiation;Requires verbal cues;Requires tactile cues General Comments: oriented to place, time, situation      Exercises      General Comments General comments (skin integrity, edema, etc.): dysmetria noted when reaching for cup and for bar on stedy      Pertinent Vitals/Pain Pain Assessment: No/denies pain    Home  Living                      Prior Function            PT Goals (current goals can now be found in the care plan section) Progress towards PT goals: Progressing toward goals    Frequency    Min 4X/week      PT Plan Current plan remains appropriate    Co-evaluation              AM-PAC PT "6 Clicks" Daily Activity  Outcome Measure  Difficulty turning over in bed (including adjusting bedclothes, sheets and blankets)?: Unable Difficulty moving from lying on back to  sitting on the side of the bed? : Unable Difficulty sitting down on and standing up from a chair with arms (e.g., wheelchair, bedside commode, etc,.)?: Unable Help needed moving to and from a bed to chair (including a wheelchair)?: A Lot Help needed walking in hospital room?: Total Help needed climbing 3-5 steps with a railing? : Total 6 Click Score: 7    End of Session Equipment Utilized During Treatment: Gait belt Activity Tolerance: Patient tolerated treatment well Patient left: in chair;with call bell/phone within reach;with family/visitor present   PT Visit Diagnosis: Other abnormalities of gait and mobility (R26.89);Muscle weakness (generalized) (M62.81);Ataxic gait (R26.0);Other symptoms and signs involving the nervous system (R29.898) Hemiplegia - Right/Left: Right Hemiplegia - dominant/non-dominant: Dominant Hemiplegia - caused by: Nontraumatic intracerebral hemorrhage     Time: [id number] PT Time Calculation (min) (ACUTE ONLY): 26 min  Charges:  $Therapeutic Activity: 23-37 mins                     Magda Kiel, [place] [facility] [phone] 04/16/2018    Reginia Naas 04/16/2018, 1:02 PM

## 2018-04-16 NOTE — Progress Notes (Addendum)
Pt A/O, denies pain. No skin issues. Noted some tremors. Smokes 1/2ppd.  She needed more time in answering questions during assessments, but she answered correctly. Educated on rehab unit and expectation. Spouse will like to be involve in therapy.

## 2018-04-16 NOTE — H&P (Signed)
Physical Medicine and Rehabilitation Admission H&P  HPI: Diane Short is a 62 year old right-handed female who is married reportedly independent up until few weeks ago with complicated medical history to include progressive stage IV progressive breast cancer followed by Dr. Hinton Rao with current completion of second cycle of chemotherapy for palliative management. Patient recently had radiation stereotactic surgery for single solitary brain met as well as history of 5 intracerebral aneurysms 3 of which treated endovascularly. Noted progressive decline over the last few weeks with intermittent headaches. She was seen by EMS 03/22/2018 taken to [facility] for unresponsive episode requiring intubation. CT scan of the head showed subarachnoid hemorrhage and subsequently transferred to [facility] for further treatment. EEG completed showing general background slowing and no seizure activity. Echocardiogram with ejection fraction of 23% grade 1 diastolic dysfunction. CT angiogram of head and neck showed increased volume of subarachnoid hemorrhage predominantly located in the basal cisterns and sylvian fissures with intraventricular extension in a pattern most consistent with aneurysmal hemorrhage. 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 Kathyrn Sheriff. She remained intubated until 04/03/2018. Maintained on Dilantin/Keppra for seizure prophylaxis.Nimotop for blood pressure control which has been completed. Subcutaneous. Lovenox initiated for DVT prophylaxis 03/31/2018. She is on a dysphagia #2 nectar thick liquid diet. Hospital course palliative care follow-up therapy evaluations completed with recommendations of physical medicine rehab consult. Patient was admitted for a comprehensive rehab program.  Review of Systems  Constitutional: Negative for chills and fever.  HENT: Negative for hearing loss.  Eyes: Negative for blurred vision and double vision.  Respiratory:  Positive for shortness of breath. Negative for cough.  Cardiovascular: Positive for leg swelling. Negative for chest pain and palpitations.  Gastrointestinal: Positive for constipation. Negative for nausea.  GERD  Genitourinary: Negative for dysuria, flank pain and hematuria.  Musculoskeletal: Positive for myalgias.  Skin: Negative for rash.  Neurological: Positive for focal weakness and headaches.  All other systems reviewed and are negative.       Past Medical History:  Diagnosis Date  . 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 Anxiety   . Arthritis   . Cancer [facility])    breast cancer  . Cerebral hemorrhage ([place]) 1986  . Depression    due to cancer dx  . Diverticulosis   . GERD (gastroesophageal reflux disease)   . Headache   . History of hiatal hernia   . Hypertension   . 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 Neutropenic fever ([place])    Chemo related  . Osteonecrosis ([place]) 2017-2018   Right Jaw bone  . Thrush, oral    Chemo related        Past Surgical History:  Procedure Laterality Date  . BREAST SURGERY Left 11/2016   metastic to bone  . CATARACT EXTRACTION Right 03/02/2017  . CEREBRAL ANGIOGRAM  1986  . CESAREAN SECTION  1980  . IR 3D INDEPENDENT WKST  01/20/2017  . IR 3D INDEPENDENT WKST  05/26/2017  . IR ANGIO INTRA EXTRACRAN SEL COM CAROTID INNOMINATE UNI R MOD SED  04/09/2017  . IR ANGIO INTRA EXTRACRAN SEL INTERNAL CAROTID BILAT MOD SED  01/20/2017  . IR ANGIO INTRA EXTRACRAN SEL INTERNAL CAROTID BILAT MOD SED  11/17/2017  . IR ANGIO INTRA EXTRACRAN SEL INTERNAL CAROTID BILAT MOD SED  03/23/2018  . IR ANGIO INTRA EXTRACRAN SEL INTERNAL CAROTID UNI R MOD SED  03/13/2017  . IR ANGIO VERTEBRAL SEL VERTEBRAL BILAT MOD SED  01/20/2017  .  IR ANGIO VERTEBRAL SEL VERTEBRAL UNI L MOD SED  05/26/2017  . IR ANGIO VERTEBRAL SEL VERTEBRAL UNI L MOD SED  11/17/2017  . IR ANGIOGRAM FOLLOW UP STUDY  03/13/2017  . IR ANGIOGRAM FOLLOW UP STUDY  03/13/2017  . IR ANGIOGRAM FOLLOW UP STUDY  03/13/2017  . IR  ANGIOGRAM FOLLOW UP STUDY  03/13/2017  . IR ANGIOGRAM FOLLOW UP STUDY  03/13/2017  . IR ANGIOGRAM FOLLOW UP STUDY  03/13/2017  . IR ANGIOGRAM FOLLOW UP STUDY  03/13/2017  . IR ANGIOGRAM FOLLOW UP STUDY  03/13/2017  . IR ANGIOGRAM FOLLOW UP STUDY  03/13/2017  . IR ANGIOGRAM FOLLOW UP STUDY  03/13/2017  . IR ANGIOGRAM FOLLOW UP STUDY  03/13/2017  . IR ANGIOGRAM FOLLOW UP STUDY  03/13/2017  . IR ANGIOGRAM FOLLOW UP STUDY  04/09/2017  . IR ANGIOGRAM FOLLOW UP STUDY  05/26/2017  . IR ANGIOGRAM FOLLOW UP STUDY  05/26/2017  . IR ANGIOGRAM FOLLOW UP STUDY  03/23/2018  . IR NEURO EACH ADD'L AFTER BASIC UNI LEFT (MS)  04/09/2017  . IR NEURO EACH ADD'L AFTER BASIC UNI LEFT (MS)  03/23/2018  . IR NEURO EACH ADD'L AFTER BASIC UNI RIGHT (MS)  03/13/2017  . IR TRANSCATH/EMBOLIZ  03/13/2017  . IR TRANSCATH/EMBOLIZ  04/09/2017  . IR TRANSCATH/EMBOLIZ  05/26/2017  . IR TRANSCATH/EMBOLIZ  03/23/2018  . IR US GUIDE VASC ACCESS RIGHT  04/09/2017  . MASTECTOMY Left   . port a cathh  04/2016  . RADIOLOGY WITH ANESTHESIA N/A 03/13/2017   Procedure: stent coiling of right MCA aneurysm; Surgeon: Consuella Lose, MD; Location: [place]; Service: Radiology; Laterality: N/A;  . RADIOLOGY WITH ANESTHESIA N/A 04/09/2017   Procedure: Arteriogram, pipeline embolization of aneruysm; Surgeon: Consuella Lose, MD; Location: [place]; Service: Radiology; Laterality: N/A;  . RADIOLOGY WITH ANESTHESIA N/A 05/26/2017   Procedure: Stent supported coil embolization; Surgeon: Consuella Lose, MD; Location: [place]; Service: Radiology; Laterality: N/A;  . RADIOLOGY WITH ANESTHESIA N/A 03/23/2018   Procedure: IR WITH ANESTHESIA; Surgeon: Consuella Lose, MD; Location: [place]; Service: Radiology; Laterality: N/A;  . TUBAL LIGATION  1983        Family History  Problem Relation Age of Onset  . Alzheimer's disease Mother   . Colon cancer Father   . Cerebral aneurysm Father   . Diabetes Sister   . Stomach cancer Brother   . Esophageal cancer  Brother   . Multiple myeloma Brother   . Hypertension Sister    Social History: reports that she has been smoking cigarettes. She has a 21.00 pack-year smoking history. She has never used smokeless tobacco. She reports that she does not drink alcohol or use drugs.  Allergies:       Allergies  Allergen Reactions  . Nsaids Other (See Comments)    GI upset   . Ace Inhibitors Other (See Comments)    cough         Medications Prior to Admission  Medication Sig Dispense Refill  . acetaminophen (TYLENOL) 500 MG tablet Take 1,000 mg by mouth 2 (two) times daily as needed for mild pain.     Marland Kitchen aspirin 81 MG tablet Take 81 mg by mouth daily.     . Calcium Citrate-Vitamin D (CALCIUM CITRATE + D3 PO) Take 1-2 tablets by mouth 2 (two) times daily. Takes 2 in the am and 1 in the evening    . carvedilol (COREG) 25 MG tablet Take 25 mg by mouth 2 (two) times daily with a meal.    .  citalopram (CELEXA) 20 MG tablet Take 20 mg by mouth daily.    . diphenhydrAMINE (BENADRYL) 25 MG tablet Take 25 mg by mouth at bedtime as needed for sleep.    Marland Kitchen loperamide (IMODIUM A-D) 2 MG tablet Take 2-4 mg by mouth daily as needed for diarrhea or loose stools.    Marland Kitchen loratadine (CLARITIN) 10 MG tablet Take 10 mg by mouth daily.    Marland Kitchen LORazepam (ATIVAN) 0.5 MG tablet Take 0.5 mg by mouth every 4 (four) hours as needed for anxiety.    Marland Kitchen omeprazole (PRILOSEC) 20 MG capsule Take 20 mg by mouth daily.    . ondansetron (ZOFRAN) 4 MG tablet Take 4 mg by mouth every 4 (four) hours as needed for nausea or vomiting.    . prochlorperazine (COMPAZINE) 10 MG tablet Take 10 mg by mouth every 6 (six) hours as needed for nausea or vomiting.    . traMADol (ULTRAM) 50 MG tablet Take 50 mg by mouth every 6 (six) hours as needed for moderate pain.     Drug Regimen Review  Drug regimen was reviewed and remains appropriate with no significant issues identified  Home:  Home Living  Family/patient expects to be discharged to:: Private  residence  Living Arrangements: Spouse/significant other  Available Help at Discharge: Family, Available 24 hours/day  Type of Home: House  Home Access: Level entry  Home Layout: One level, Other (Comment)(1 step to den/kitchen)  Bathroom Shower/Tub: Tub/shower unit, Artist: Standard  Bathroom Accessibility: Yes  Home Equipment: Grab bars - tub/shower  Lives With: Spouse  Functional History:  Prior Function  Level of Independence: Independent  Comments: drove; husband occasionally helpes with drying off legs due to unsteadiness  Functional Status:  Mobility:  Bed Mobility  Overal bed mobility: Needs Assistance  Bed Mobility: Rolling, Sidelying to Sit  Rolling: Mod assist  Sidelying to sit: Mod assist  Supine to sit: Total assist, +2 for physical assistance  Sit to supine: Min assist  Sit to sidelying: Total assist, +2 for physical assistance  General bed mobility comments: assist to initiate task, and assist to lift trunk. Pt spontanseously lowered trunk to bed and required assist to fully lift LEs onto bed  Transfers  Overall transfer level: Needs assistance  Equipment used: Ambulation equipment used  Transfer via Lift Equipment: Stedy  Transfers: Sit to/from Stand  Sit to Stand: Mod assist, +2 physical assistance  Squat pivot transfers: Total assist, +2 physical assistance  General transfer comment: assist to power up into standing from bed, and min A to stand from stedy. Did not transfer pt due to pt had just returned to bed after sitting up in recliner for extended period of time. Stood x5 working on erect posture  Ambulation/Gait  Ambulation/Gait assistance: (NT)  General Gait Details: not able   ADL:  ADL  Overall ADL's : Needs assistance/impaired  Eating/Feeding: Maximal assistance  Grooming: Oral care, Wash/dry face, Maximal assistance, Sitting  Grooming Details (indicate cue type and reason): Pt able to comb the front portion of her hair, but  requires assist to complete task  Upper Body Bathing: Maximal assistance, Bed level  Lower Body Bathing: Maximal assistance, Sit to/from stand  Upper Body Dressing : Maximal assistance, Sitting  Lower Body Dressing: Total assistance, Bed level  Toilet Transfer: Moderate assistance, +2 for physical assistance, +2 for safety/equipment, BSC  Toilet Transfer Details (indicate cue type and reason): using stedy  Toileting- Clothing Manipulation and Hygiene: Maximal assistance, Sit to/from stand  Toileting - Clothing Manipulation Details (indicate cue type and reason): incontinent of BM; total A to clean; apparent moisture assisted skin damage  Functional mobility during ADLs: Moderate assistance, +2 for physical assistance, +2 for safety/equipment  General ADL Comments: total A all ADL  Cognition:  Cognition  Overall Cognitive Status: Impaired/Different from baseline  Arousal/Alertness: Awake/alert  Orientation Level: Disoriented to situation, Oriented to person, Oriented to place, Oriented to time  Attention: Focused, Sustained  Focused Attention: Appears intact  Sustained Attention: Impaired  Sustained Attention Impairment: Verbal basic, Functional basic  Memory: Impaired  Memory Impairment: Storage deficit  Awareness: Impaired  Awareness Impairment: Emergent impairment  Problem Solving: Impaired  Problem Solving Impairment: Verbal basic, Functional basic  Executive Function: Self Monitoring, Initiating  Initiating: Impaired  Initiating Impairment: Verbal basic, Functional basic  Self Monitoring: Impaired  Self Monitoring Impairment: Verbal basic, Functional basic  Cognition  Arousal/Alertness: Awake/alert  Behavior During Therapy: Flat affect, WFL for tasks assessed/performed  Overall Cognitive Status: Impaired/Different from baseline  Area of Impairment: Orientation, Attention, Memory, Following commands, Safety/judgement, Awareness, Problem solving  Orientation Level: Disoriented  to, Time, Place  Current Attention Level: Sustained, Selective  Memory: Decreased short-term memory  Following Commands: Follows one step commands consistently, Follows multi-step commands inconsistently, Follows multi-step commands with increased time  Safety/Judgement: Decreased awareness of safety, Decreased awareness of deficits  Awareness: Intellectual  Problem Solving: Slow processing, Decreased initiation, Difficulty sequencing, Requires tactile cues, Requires verbal cues  General Comments: Pt intially lethargic, but as session progressed, pt became more animated. She demonstrates a delay in processing and initiation, but by end of session, she attempted to tell a complex joke and did so with 75% accuracy (per spouse).    Physical Exam:  Blood pressure (!) 159/87, pulse 94, temperature 98.1 F (36.7 C), temperature source Oral, resp. rate 15, height '5\' 4"'  (1.626 m), weight 102 kg, last menstrual period 08/14/2010, SpO2 99 %.  Physical Exam  Vitals reviewed.  Constitutional: No distress.  HENT:  Head: Normocephalic.  Eyes: Pupils are equal, round, and reactive to light. Conjunctivae are normal. Right eye exhibits no discharge. Left eye exhibits no discharge.  Neck: Normal range of motion.  Cardiovascular: Normal rate. Exam reveals no gallop and no friction rub.  No murmur heard.  Respiratory: Effort normal and breath sounds normal. No respiratory distress. She has no wheezes. She has no rales.  GI: Soft. She exhibits no distension. There is no tenderness.  Musculoskeletal: Normal range of motion.  Neurological:  Patient is alert. Sitting up in bed. Makes good eye contact with examiner. Follows full commands. She does initiate conversation and provide her name, age and date of birth. Able to ID simple objects but delays with processing and more complex language. Mild right hemiparesis. RUE 3-4/5. LUE 4/5. RLE 3-4/5. LLE 4-/5. Senses pain in all 4's.  Skin: Skin is warm.  Psychiatric:   Pleasant and cooperative   Lab Results Last 48 Hours  Imaging Results (Last 48 hours)     Medical Problem List and Plan:  1. 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 DVT Prophylaxis/Anticoagulation: Subcutaneous Lovenox. Monitor for any bleeding episodes  3. Pain Management: Tylenol as needed  4. Mood: Seroquel 25 mg nightly  5. Neuropsych: This patient is capable of making decisions on her own behalf.  6. Skin/Wound Care: Routine skin checks  7. Fluids/Electrolytes/Nutrition: Routine in and outs with follow-up chemistries  8. Stage IV breast cancer. Palliative chemotherapy completed. Follow-up Dr. Hinton Rao  -palliative care following patient  9. Dysphagia. Dysphasia #2 nectar liquids  10. Hypertension. Norvasc 10 mg daily, Coreg 25 mg twice daily, labetalol 100 mg 3 times daily  11. Seizure prophylaxis. Dilantin 100 mg 3 times daily, Keppra 750 mg twice daily   Post Admission Physician Evaluation:  1. Functional deficits secondary to [facility] with right hemiparesis and aphasia. 2. Patient is admitted to receive collaborative, interdisciplinary care between the physiatrist, rehab nursing staff, and therapy team. 3. Patient's level of medical complexity and substantial therapy needs in context of that medical necessity cannot be provided at a lesser intensity of care such as a SNF. 4. Patient has experienced  substantial functional loss from his/her baseline which was documented above under the "Functional History" and "Functional Status" headings. Judging by the patient's diagnosis, physical exam, and functional history, the patient has potential for functional progress which will result in measurable gains while on inpatient rehab. These gains will be of substantial and practical use upon discharge in facilitating mobility and self-care at the household level. 5. Physiatrist will provide 24 hour management of medical needs as well as oversight of the therapy plan/treatment and provide guidance as appropriate regarding the interaction of the two. 6. The Preadmission Screening has been reviewed and patient status is unchanged unless otherwise stated above. 7. 24 hour rehab nursing will assist with bladder management, bowel management, safety, skin/wound care, disease management, medication administration, pain management and patient education and help integrate therapy concepts, techniques,education, etc. 8. PT will assess and treat for/with: Lower extremity strength, range of motion, stamina, balance, functional mobility, safety, adaptive techniques and equipment, NMR, family education. Goals are: min assist. 9. OT will assess and treat for/with: ADL's, functional mobility, safety, upper extremity strength, adaptive techniques and equipment, NMR, family education. Goals are: min assist. Therapy may proceed with showering this patient. 10. SLP will assess and treat for/with: cognition, communication, language Goals are: min assist. 11. Case Management and Social Worker will assess and treat for psychological issues and discharge planning. 12. Team conference will be held weekly to assess progress toward goals and to determine barriers to discharge. 13. Patient will receive at least 3 hours of therapy per day at least 5 days per week. 14. ELOS: 5-8 days  15. Prognosis: good   I have personally performed a  face to face diagnostic evaluation of this patient and formulated the key components of the plan. Additionally, I have personally reviewed laboratory data, imaging studies, as well as relevant notes and concur with the physician assistant's documentation above.   Meredith Staggers, MD, Mellody Drown  Lavon Paganini [place], PA-C  04/16/2018  The patient's status has not changed. The original post admission physician evaluation remains appropriate, and any changes from the pre-admission screening or documentation from the acute chart are noted above.  Meredith Staggers, MD 04/16/2018

## 2018-04-16 NOTE — Discharge Summary (Signed)
Physician Discharge Summary  Diane Short EGB:[id number] DOB: 03/09/1956 DOA: 03/22/2018  PCP: Burnard Bunting, MD  Admit date: 03/22/2018   Discharge date: 04/16/2018  Admitted From: Home  Disposition:  CIR  Discharge Condition:Stable  CODE STATUS: FULL,  Diet recommendation: Dysphagia 2  Brief/Interim Summary: 62 year old female with past medical history of HTN, neuropathy, GERD, depression, cerebral aneurysm status post intervention who presented to the hospital with persistent headache and altered mental status, and was found to have Hunt Hess grade 4 SAH. Patient was intubated in the ED for airway protection, and subsequently started on nimodipine. 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 Patient was successfully extubated on 9/21.    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 Nerurosurgery on board. Patient doing well. Continued PT in hospital, discharging to rehab today.    Acute idiopathic encephalopathy. Improved  On Dilantin and Keppra. Seen by neurology.  Can change Dilantin to 300 mg once a day at night once the patient is able to swallow well.  Significant Vitamin D deficiency, on vitamin D replacement.  Will continue on discharge  Hypernatremia . Improved and resolved.  Acute respiratory failure with hypoxia & hypercapnia.  Resolved- extubated 9/21.  No acute issues at this time  Mild likely traumatic, rectal ulceration -continue local care.  Seen by wound care during hospitalization.  Macrocytic anemia. P71 and folic acid normal - likely due to chemotherapy   Aspiration pneumonia/dysphagia.  Status post modified barium swallow.  On dysphagia 2 diet.  Completed treatment with Unasyn.  Please follow aspiration precautions.  Hypertension.  Continue monitor. On labetolol, amlod and coreg  with overall control of blood pressure..  Deconditioning/debility.patient is awaiting for inpatient rehab transfer today.  Acute urinary retention-resolved.  No issues at this time.  Stage 4 breast cancer s/p mastectomy, XRT to brain met, and recently on chemo.  Patient will need to follow oncology as outpatient.   Chemo induced anemia- will need outpatient Oncology follow-up.  Monitor CBC closely.  Hemoglobin has remained stable.  Discharge Diagnoses:     SAH (subarachnoid hemorrhage) [facility])   Discharge Instructions  Discharge Instructions    Ambulatory referral to Neurology   Complete by:  As directed    An appointment is requested in approximately: 2 weeks   Diet - low sodium heart healthy   Complete by:  As directed    Dysphagia II with nectar thick   Discharge instructions   Complete by:  As directed    Follow up with neurosurgery as outpatient in 1-2 weeks.   Increase activity slowly   Complete by:  As directed      Allergies as of 04/16/2018      Reactions   Nsaids Other (See Comments)   GI upset    Ace Inhibitors Other (See Comments)   cough      Medication List    STOP taking these medications   aspirin 81 MG tablet   diphenhydrAMINE 25 MG tablet Commonly known as:  BENADRYL   loratadine 10 MG tablet Commonly known as:  CLARITIN   LORazepam 0.5 MG tablet Commonly known as:  ATIVAN   prochlorperazine 10 MG tablet Commonly known as:  COMPAZINE     TAKE these medications   acetaminophen 500 MG tablet Commonly known as:  TYLENOL Take 1,000 mg by mouth 2 (two) times daily as needed for  mild pain.   amLODipine 10 MG tablet Commonly known as:  NORVASC Take 1 tablet (10 mg total) by mouth daily. Start taking on:  04/17/2018   CALCIUM CITRATE + D3 PO Take 1-2 tablets by mouth 2 (two) times daily. Takes 2 in the am and 1 in the evening   carvedilol 25 MG tablet Commonly known as:  COREG Take 25 mg by mouth 2 (two) times daily with a meal.    citalopram 20 MG tablet Commonly known as:  CELEXA Take 20 mg by mouth daily.   enoxaparin 30 MG/0.3ML injection Commonly known as:  LOVENOX Inject 0.4 mLs (40 mg total) into the skin daily. Start taking on:  04/17/2018   famotidine 20 MG tablet Commonly known as:  PEPCID Take 1 tablet (20 mg total) by mouth daily. Start taking on:  04/17/2018   feeding supplement (ENSURE ENLIVE) Liqd Take 237 mLs by mouth 2 (two) times daily between meals.   Gerhardt's butt cream Crea Apply 1 application topically 4 (four) times daily.   labetalol 100 MG tablet Commonly known as:  NORMODYNE Take 1 tablet (100 mg total) by mouth 3 (three) times daily.   levETIRAcetam 750 MG tablet Commonly known as:  KEPPRA Take 1 tablet (750 mg total) by mouth 2 (two) times daily.   loperamide 2 MG tablet Commonly known as:  IMODIUM A-D Take 2-4 mg by mouth daily as needed for diarrhea or loose stools.   mouth rinse Liqd solution 15 mLs by Mouth Rinse route 2 (two) times daily.   omeprazole 20 MG capsule Commonly known as:  PRILOSEC Take 20 mg by mouth daily.   ondansetron 4 MG tablet Commonly known as:  ZOFRAN Take 4 mg by mouth every 4 (four) hours as needed for nausea or vomiting.   phenytoin 100 MG ER capsule Commonly known as:  DILANTIN Take 1 capsule (100 mg total) by mouth 3 (three) times daily.   QUEtiapine 25 MG tablet Commonly known as:  SEROQUEL Take 1 tablet (25 mg total) by mouth at bedtime.   traMADol 50 MG tablet Commonly known as:  ULTRAM Take 50 mg by mouth every 6 (six) hours as needed for moderate pain.       Allergies  Allergen Reactions  . Nsaids Other (See Comments)    GI upset   . Ace Inhibitors Other (See Comments)    cough    Consultations:  Neurosurgery, palliative care, neurology, wound care   Procedures/Studies: Ct Angio Head W Or Wo Contrast  Result Date: 04/08/2018 CLINICAL DATA:  Cerebral aneurysm, treated, asymptomatic, follow-up. Recent  subarachnoid hemorrhage from recanalized left PICA aneurysm, re treated with coiling. EXAM: CT ANGIOGRAPHY HEAD TECHNIQUE: Multidetector CT imaging of the head was performed using the standard protocol during bolus administration of intravenous contrast. Multiplanar CT image reconstructions and MIPs were obtained to evaluate the vascular anatomy. CONTRAST:  57m ISOVUE-370 IOPAMIDOL (ISOVUE-370) INJECTION 76% COMPARISON:  Head CT 03/23/2018 FINDINGS: CT HEAD Brain: Decreased intracranial hemorrhage, now mainly seen layering in the occipital horns of lateral ventricles. There is no hydrocephalus. No infarct. Vascular: See below Skull: Unremarkable remote right pterional craniotomy Sinuses: Bilateral sphenoid sinusitis with mucosal thickening and fluid levels. There is also left mastoid and middle ear opacification and partial right mastoid opacification. Orbits: Bilateral cataract resection CTA HEAD Anterior circulation: Symmetric carotid arteries, partially obscured on the left due to a pipeline stent covering an aneurysm that shows no detected filling. There is been stent assisted coil embolization of a  right MCA bifurcation aneurysm, with very limited assessment in this region due to streak artifact. Adjacent high-density above and below the coil mass could be scarring or artifact. There is no beading or focal proximal stenosis to imply vasospasm. Known, untreated anterior communicating artery region aneurysm recently evaluated by catheter angiography, 4.6 cm craniocaudal today. There is also an untreated posteriorly projecting smoothly contoured right posterior communicating artery region aneurysm measuring 3 mm. Posterior circulation: Symmetric vertebral arteries. The vertebral and basilar arteries are smooth and widely patent. There is a section of obscured left V4 segment due to coiled PICA aneurysm. No branch occlusion, beading, or significant stenosis seen. Venous sinuses: Patent Anatomic variants: None  significant Delayed phase: No abnormal intracranial enhancement IMPRESSION: 1. No emergent finding. No recurrent hemorrhage, visible infarct, or evident vasospasm. 2. Residual intracranial hemorrhage is seen layering in the occipital horns of the lateral ventricles. No hydrocephalus. 3. Bilateral sphenoid sinusitis. There is extensive left mastoid and middle ear opacification and mild right mastoid opacification. 4. 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 Electronically Signed   By: Monte Fantasia M.D.   On: 04/08/2018 13:33   Ct Angio Head W Or Wo Contrast  Result Date: 03/22/2018 CLINICAL DATA:  Subarachnoid hemorrhage EXAM: CT ANGIOGRAPHY HEAD AND NECK TECHNIQUE: Multidetector CT imaging of the head and neck was performed using the standard protocol during bolus administration of intravenous contrast. Multiplanar CT image reconstructions and MIPs were obtained to evaluate the vascular anatomy. Carotid stenosis measurements (when applicable) are obtained utilizing NASCET criteria, using the distal internal carotid diameter as the denominator. CONTRAST:  8m ISOVUE-370 IOPAMIDOL (ISOVUE-370) INJECTION 76% COMPARISON:  Head CT 03/22/2018 and 12/24/2016 Cerebral angiogram 11/17/2017 FINDINGS: CTA NECK FINDINGS AORTIC ARCH: There is mild calcific atherosclerosis of the aortic arch. There is no aneurysm, dissection or hemodynamically significant stenosis of the visualized ascending aorta and aortic arch. Conventional 3 vessel aortic branching pattern. The visualized proximal subclavian arteries are widely patent. RIGHT CAROTID SYSTEM: --Common carotid artery: Widely patent origin without common carotid artery dissection or aneurysm. --Internal carotid artery: No dissection, occlusion or aneurysm. No hemodynamically significant stenosis.  --External carotid artery: No acute abnormality. LEFT CAROTID SYSTEM: --Common carotid artery: Widely patent origin without common carotid artery dissection or aneurysm. --Internal carotid artery:No dissection, occlusion or aneurysm. No hemodynamically significant stenosis. --External carotid artery: No acute abnormality. VERTEBRAL ARTERIES: Codominant configuration. Both origins are normal. No dissection, occlusion or flow-limiting stenosis to the vertebrobasilar confluence. SKELETON: There is no bony spinal canal stenosis. No lytic or blastic lesion. OTHER NECK: Normal pharynx, larynx and major salivary glands. No cervical lymphadenopathy. Unremarkable thyroid gland. UPPER CHEST: Endotracheal tube tip is just above the carina. There is bilateral upper lobe dependent atelectasis. CTA HEAD FINDINGS ANTERIOR CIRCULATION: --Intracranial internal carotid arteries: Flow diverting stent of the left internal carotid artery cavernous segment. No aneurysm filling at this site. Normal right ICA. --Anterior cerebral arteries: There is an inferiorly projecting aneurysm of the anterior communicating artery that measures 2 mm at its base and 3 mm base to apex. Both A1 segments are patent and normal caliber. The distal anterior cerebral arteries are normal. --Middle cerebral arteries: There is a large coil mass at the site of the treated right MCA bifurcation aneurysm. Streak artifact obscures the area. There appears to be a focus of residual aneurysm filling measuring 2-3 mm (series  7, image 95). The left middle cerebral artery is normal. --Posterior communicating arteries: There is an inferiorly projecting aneurysm of the right P-comm origin that measures 3 mm at its base and 3 mm base to apex. The left P-comm is absent. POSTERIOR CIRCULATION: --Basilar artery: Normal. --Posterior cerebral arteries: Normal. --Superior cerebellar arteries: Normal. --Inferior cerebellar arteries: There is a coil mass at the site of a treated left  PICA aneurysm. There appears to be a mild amount of residual filling along the medial aspect. VENOUS SINUSES: As permitted by contrast timing, patent. ANATOMIC VARIANTS: None DELAYED PHASE: No parenchymal contrast enhancement. Redemonstration of subarachnoid hemorrhage within the basal cisterns and sylvian fissures with intraventricular spread and associated communicating hydrocephalus. Volume of hemorrhage is increased slightly since the earlier study. Review of the MIP images confirms the above findings. IMPRESSION: 1. Increased volume of subarachnoid hemorrhage predominantly located in the basal cisterns and sylvian fissures with intraventricular extension in a pattern most consistent with aneurysmal hemorrhage. It is not clear which aneurysm is the source of the hemorrhage, but the pattern seems to favor the left PICA as the most likely site. 2. Small focus of residual filling at the medial aspect of the treated left PICA aneurysm. 3. Treated large right MCA bifurcation aneurysm with a small focus of possible residual filling that is poorly visualized due to streak artifact from the coil mass. 4. Unchanged appearance of inferiorly projecting right P-comm aneurysm compared to the angiogram of 11/17/2017. 5. Unchanged appearance of inferiorly projecting A-comm aneurysm compared to cerebral angiogram of 11/17/2017. 6. Flow diverting stent at left ICA cavernous segment aneurysm site without residual filling. 7. No large vessel occlusion. Electronically Signed   By: Ulyses Jarred M.D.   On: 03/22/2018 06:49   Ct Head Wo Contrast  Result Date: 03/23/2018 CLINICAL DATA:  62 y/o  F; follow-up of subarachnoid hemorrhage. EXAM: CT HEAD WITHOUT CONTRAST TECHNIQUE: Contiguous axial images were obtained from the base of the skull through the vertex without intravenous contrast. COMPARISON:  03/22/2018 CT head and CTA head.  02/26/2018 MRI head. FINDINGS: Brain: Stable intraventricular hemorrhage as well as subarachnoid  hemorrhage concentrated in left prepontine cistern and left CP angle cistern. No new intracranial hemorrhage, stroke, or focal mass effect. Stable ventricle size. Cavum septum pellucidum. Stable chronic microvascular ischemic changes and volume loss of the brain. Stable right anterior temporal lobe encephalomalacia. Vascular: Left cavernous ICA stent as well as left PICA and right MCA bifurcation coiled aneurysms again noted. Right P-comm and A-comm aneurysms poorly assessed without contrast. Skull: Stable postsurgical changes related to right lateral craniotomy. Sinuses/Orbits: Partial opacification of the left mastoid air cells. Mild mucosal thickening of the maxillary sinuses. Normal aeration of right mastoid air cells. Bilateral intra-ocular lens replacement. Other: None. IMPRESSION: 1. Stable subarachnoid and intraventricular hemorrhage. Stable ventricle size. No herniation. 2. No new acute intracranial abnormality identified. Electronically Signed   By: Kristine Garbe M.D.   On: 03/23/2018 05:40   Ct Angio Neck W And/or Wo Contrast  Result Date: 03/22/2018 CLINICAL DATA:  Subarachnoid hemorrhage EXAM: CT ANGIOGRAPHY HEAD AND NECK TECHNIQUE: Multidetector CT imaging of the head and neck was performed using the standard protocol during bolus administration of intravenous contrast. Multiplanar CT image reconstructions and MIPs were obtained to evaluate the vascular anatomy. Carotid stenosis measurements (when applicable) are obtained utilizing NASCET criteria, using the distal internal carotid diameter as the denominator. CONTRAST:  84m ISOVUE-370 IOPAMIDOL (ISOVUE-370) INJECTION 76% COMPARISON:  Head CT 03/22/2018 and 12/24/2016 Cerebral angiogram  11/17/2017 FINDINGS: CTA NECK FINDINGS AORTIC ARCH: There is mild calcific atherosclerosis of the aortic arch. There is no aneurysm, dissection or hemodynamically significant stenosis of the visualized ascending aorta and aortic arch. Conventional 3  vessel aortic branching pattern. The visualized proximal subclavian arteries are widely patent. RIGHT CAROTID SYSTEM: --Common carotid artery: Widely patent origin without common carotid artery dissection or aneurysm. --Internal carotid artery: No dissection, occlusion or aneurysm. No hemodynamically significant stenosis. --External carotid artery: No acute abnormality. LEFT CAROTID SYSTEM: --Common carotid artery: Widely patent origin without common carotid artery dissection or aneurysm. --Internal carotid artery:No dissection, occlusion or aneurysm. No hemodynamically significant stenosis. --External carotid artery: No acute abnormality. VERTEBRAL ARTERIES: Codominant configuration. Both origins are normal. No dissection, occlusion or flow-limiting stenosis to the vertebrobasilar confluence. SKELETON: There is no bony spinal canal stenosis. No lytic or blastic lesion. OTHER NECK: Normal pharynx, larynx and major salivary glands. No cervical lymphadenopathy. Unremarkable thyroid gland. UPPER CHEST: Endotracheal tube tip is just above the carina. There is bilateral upper lobe dependent atelectasis. CTA HEAD FINDINGS ANTERIOR CIRCULATION: --Intracranial internal carotid arteries: Flow diverting stent of the left internal carotid artery cavernous segment. No aneurysm filling at this site. Normal right ICA. --Anterior cerebral arteries: There is an inferiorly projecting aneurysm of the anterior communicating artery that measures 2 mm at its base and 3 mm base to apex. Both A1 segments are patent and normal caliber. The distal anterior cerebral arteries are normal. --Middle cerebral arteries: There is a large coil mass at the site of the treated right MCA bifurcation aneurysm. Streak artifact obscures the area. There appears to be a focus of residual aneurysm filling measuring 2-3 mm (series 7, image 95). The left middle cerebral artery is normal. --Posterior communicating arteries: There is an inferiorly projecting  aneurysm of the right P-comm origin that measures 3 mm at its base and 3 mm base to apex. The left P-comm is absent. POSTERIOR CIRCULATION: --Basilar artery: Normal. --Posterior cerebral arteries: Normal. --Superior cerebellar arteries: Normal. --Inferior cerebellar arteries: There is a coil mass at the site of a treated left PICA aneurysm. There appears to be a mild amount of residual filling along the medial aspect. VENOUS SINUSES: As permitted by contrast timing, patent. ANATOMIC VARIANTS: None DELAYED PHASE: No parenchymal contrast enhancement. Redemonstration of subarachnoid hemorrhage within the basal cisterns and sylvian fissures with intraventricular spread and associated communicating hydrocephalus. Volume of hemorrhage is increased slightly since the earlier study. Review of the MIP images confirms the above findings. IMPRESSION: 1. Increased volume of subarachnoid hemorrhage predominantly located in the basal cisterns and sylvian fissures with intraventricular extension in a pattern most consistent with aneurysmal hemorrhage. It is not clear which aneurysm is the source of the hemorrhage, but the pattern seems to favor the left PICA as the most likely site. 2. Small focus of residual filling at the medial aspect of the treated left PICA aneurysm. 3. Treated large right MCA bifurcation aneurysm with a small focus of possible residual filling that is poorly visualized due to streak artifact from the coil mass. 4. Unchanged appearance of inferiorly projecting right P-comm aneurysm compared to the angiogram of 11/17/2017. 5. Unchanged appearance of inferiorly projecting A-comm aneurysm compared to cerebral angiogram of 11/17/2017. 6. Flow diverting stent at left ICA cavernous segment aneurysm site without residual filling. 7. No large vessel occlusion. Electronically Signed   By: Ulyses Jarred M.D.   On: 03/22/2018 06:49   Mr Brain Wo Contrast  Result Date: 04/12/2018 CLINICAL DATA:  Encephalopathy.  History of aneurysm coiling. History of metastatic breast cancer EXAM: MRI HEAD WITHOUT CONTRAST TECHNIQUE: Multiplanar, multiecho pulse sequences of the brain and surrounding structures were obtained without intravenous contrast. COMPARISON:  CT head 04/08/2018, MRI 02/26/2018 FINDINGS: Brain: Image quality degraded by motion. The patient was not able to complete the exam Negative for acute fracture. Right temporal craniotomy with encephalomalacia in the right anterior temporal lobe. Blood in the occipital horns bilaterally as noted previously without hydrocephalus. Negative for mass lesion. Vascular: Stenting and coiling of right MCA aneurysm. Coiling of left distal vertebral artery aneurysm. Normal arterial flow voids base of brain Skull and upper cervical spine: Abnormality of the clivus which shows abnormal signal and mild expansion compatible with metastatic disease. This is unchanged. Right frontal bone metastatic deposit unchanged. Sinuses/Orbits: Mucosal edema in the sphenoid sinus bilaterally. Bilateral mastoid effusion. Bilateral orbital cataract surgery Other: None IMPRESSION: Negative for acute infarct. Encephalomalacia right temporal lobe with coiling and stenting of right MCA aneurysm. Blood products in the occipital horns bilaterally due to prior intracranial hemorrhage. Patient not able to complete the study. Bony metastatic disease. Electronically Signed   By: Franchot Gallo M.D.   On: 04/12/2018 19:04   Ir Transcath/emboliz  Result Date: 03/23/2018 PROCEDURE: DIAGNOSTIC CEREBRAL ANGIOGRAM COIL EMBOLIZATION LEFT PICA ANEURYSM HISTORY: The patient is a 62 year old woman with history of multiple intracranial aneurysms which have been treated on an elective basis. She also has a history of metastatic breast cancer. Unfortunately, she presented to the emergency department with sudden onset of headache and altered mental status. She required intubation. CT scan demonstrated diffuse basal  subarachnoid hemorrhage. Upon exam, she had good neurologic function, and the decision was made to proceed with angiogram for possible endovascular treatment of the subarachnoid hemorrhage. ACCESS: The technical aspects of the procedure as well as its potential risks and benefits were reviewed with the patient's family. These risks included but were not limited to stroke, intracranial hemorrhage, bleeding, infection, allergic reaction, damage to organs or vital structures, stroke, non-diagnostic procedure, and the catastrophic outcomes of heart attack, coma, and death. With an understanding of these risks, informed consent was obtained and witnessed. The patient was placed in the supine position on the angiography table and the skin of right groin prepped in the usual sterile fashion. The procedure was performed under general anesthesia. A 5- French sheath was introduced in the right common femoral artery using Seldinger technique. MEDICATIONS: HEPARIN: 0 Units total. CONTRAST:  21m OMNIPAQUE IOHEXOL 300 MG/ML  SOLNcc, Omnipaque 300 FLUOROSCOPY TIME:  FLUOROSCOPY TIME: 11.9 combined AP and lateral minutes TECHNIQUE: CATHETERS AND WIRES 5-French JB-1 catheter 180 cm 0.035" glidewire 6-French NeuronMax guide sheath 6-French Berenstein Select JB-1 catheter 0.058" CatV guidecatheter 150 cm Marksman microcatheter Synchro 2 standard microwire Excelsior XT-17 microcatheter COILS USED Target XL soft 455mx 6cm VESSELS CATHETERIZED Right internal carotid Left internal carotid Left vertebral Right common femoral Left posterior inferior cerebellar artery VESSELS STUDIED Right internal carotid, head Left internal carotid, head Left vertebral Left vertebral artery, head (during embolization) Left vertebral artery, head (immediate post-embolization) Left vertebral artery, head (final control) Right common femoral PROCEDURAL NARRATIVE A 5-Fr JB-1 glide catheter was advanced over a 0.035 glidewire into the aortic arch. The above  vessels were then sequentially catheterized and cervical / cerebral angiograms taken. After review of images, the catheter was removed without incident. The 5-Fr sheath was then exchanged over the glidewire for an 8-Fr sheath. Under real-time fluoroscopy, the guide sheath was advanced over the select  catheter and glidewire into the descending aorta. The select catheter was then advanced into the left subclavian artery. The guide sheath was then advanced into the proximal left subclavian, just proximal to the origin of the left vertebral artery. The Select catheter was removed and the 058 guide catheter was coaxially introduced over the microcatheter and microwire. The microcatheter was then advanced under roadmap guidance into the distal cervical left vertebral artery. The guide catheter was then advanced into the distal cervical left vertebral artery. The microcatheter was further advanced into the proximal basilar artery which allowed advancement of the guide catheter into the V4 segment, just proximal to the origin of the left PICA. The microcatheter was then removed and the coiling catheter introduced over the microwire. The catheter was then navigated into the aneurysm lumen over the microwire. The wire was then removed. The above coil was then placed and angiogram was taken. The coil was then deployed. It appeared that the aneurysm was largely occluded. I therefore replaced the microwire and withdrew the coiling catheter. Angiogram was taken. The guide catheter was then withdrawn into the cervical vertebral artery and final control angiogram was taken. The guide catheter and guide sheath were then synchronously removed without incident. FINDINGS: Right internal carotid: head: Injection reveals the presence of a widely patent ICA, M1, and A1 segments and their branches. Previously seen coil mass at the right MCA bifurcation is again seen. Y stent configuration extending from the M1 into the major branches of  the M2 is again seen. There is no in stent stenosis. There is no aneurysm residual or recurrence identified. 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 The parenchymal and venous phases are normal. The venous sinuses are widely patent. Left internal carotid: head: Injection reveals the presence of a widely patent ICA, A1, and M1 segments and their branches. Pipeline device is again seen extending from the horizontal cavernous to supraclinoid internal carotid artery. 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 Pattern of filling today appears to be less brisk than the prior angiogram in May of this year suggesting continued flow remodeling. The parenchymal and venous phases are normal. The venous sinuses are widely patent. Left vertebral: Injection reveals the presence of a widely patent vertebral artery. This leads to a widely patent basilar artery that terminates in bilateral P1. The basilar apex is normal. While the previous angiogram demonstrated a neck residual, angiogram today demonstrates filling of the entire dome of the aneurysm, now measuring approximately 5.2 x 3.7 mm. The parenchymal and venous phases are normal. The venous sinuses are widely patent. Left vertebral, head (during embolization): Injection reveals widely patent left vertebral artery, and a widely patent left PICA. Coil appears well positioned within the aneurysm, without any coil prolapse. No filling defects are seen. There is no filling of the aneurysm dome. There is possible small neck remnant although this could also be the natural course of the parent vessel. Left vertebral, head  (immediate post embolization): Injection demonstrates widely patent left vertebral artery, basilar artery, and basilar bifurcation. The PICA is widely patent. Coil mass within the PICA aneurysm is stable. No aneurysm filling is identified. Left vertebral, head (final control): Injection reveals the presence of a widely patent vertebral artery.  This leads to a widely patent basilar artery that terminates in bilateral P1. The basilar apex is normal. Coil mass within the PICA aneurysm is stable. The PICA remains widely patent. There is no filling defects. No aneurysm filling is seen. The parenchymal and venous phases are normal. The venous sinuses are widely patent. Right femoral: Normal vessel. No significant atherosclerotic disease. Arterial sheath in adequate position. DISPOSITION: Upon completion of the study, the femoral sheath was removed and hemostasis obtained using a 7-Fr ExoSeal closure device. Good proximal and distal lower extremity pulses were documented upon achievement of hemostasis. The procedure was well tolerated and no early complications were observed. The patient was transferred to the neuro critical care unit for further care. IMPRESSION: 1. 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 No aneurysm filling is seen post embolization 2. 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 3. 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 The preliminary results of this procedure were shared with the patient and the patient's family.  Electronically Signed   By: Consuella Lose   On: 03/23/2018 18:23   Ir Angiogram Follow Up Study  Result Date: 03/23/2018 PROCEDURE: DIAGNOSTIC CEREBRAL ANGIOGRAM COIL EMBOLIZATION LEFT PICA ANEURYSM HISTORY: The patient is a 62 year old woman with history of multiple intracranial aneurysms which have been treated on an elective basis. She also has a history of metastatic breast cancer. Unfortunately, she presented to the emergency department with sudden onset of headache and altered mental status. She required intubation. CT scan demonstrated diffuse basal subarachnoid hemorrhage. Upon exam, she had good neurologic function, and the decision was made to proceed with angiogram for possible endovascular treatment of the subarachnoid hemorrhage. ACCESS: The technical aspects of the procedure as well as its potential risks and benefits were reviewed with the patient's family. These risks included but were not limited to stroke, intracranial hemorrhage, bleeding, infection, allergic reaction, damage to organs or vital structures, stroke, non-diagnostic procedure, and the catastrophic outcomes of heart attack, coma, and death. With an understanding of these risks, informed consent was obtained and witnessed. The patient was placed in the supine position on the angiography table and the skin of right groin prepped in the usual sterile fashion. The procedure was performed under general anesthesia. A 5- French sheath was introduced in the right common femoral artery using Seldinger technique. MEDICATIONS: HEPARIN: 0 Units total. CONTRAST:  46m OMNIPAQUE IOHEXOL 300 MG/ML  SOLNcc, Omnipaque 300 FLUOROSCOPY TIME:  FLUOROSCOPY TIME: 11.9 combined AP and lateral minutes TECHNIQUE: CATHETERS AND WIRES 5-French JB-1 catheter 180 cm 0.035" glidewire 6-French NeuronMax guide sheath 6-French Berenstein Select JB-1 catheter 0.058" CatV guidecatheter 150 cm Marksman microcatheter Synchro 2 standard microwire Excelsior XT-17  microcatheter COILS USED Target XL soft 498mx 6cm VESSELS CATHETERIZED Right internal carotid Left internal carotid Left vertebral Right common femoral Left posterior inferior cerebellar artery VESSELS STUDIED Right internal carotid, head Left internal carotid, head Left vertebral Left vertebral artery, head (during embolization) Left vertebral artery, head (immediate post-embolization) Left vertebral artery, head (final control) Right common femoral PROCEDURAL NARRATIVE A 5-Fr JB-1 glide catheter was advanced over a 0.035 glidewire into the aortic arch.  The above vessels were then sequentially catheterized and cervical / cerebral angiograms taken. After review of images, the catheter was removed without incident. The 5-Fr sheath was then exchanged over the glidewire for an 8-Fr sheath. Under real-time fluoroscopy, the guide sheath was advanced over the select catheter and glidewire into the descending aorta. The select catheter was then advanced into the left subclavian artery. The guide sheath was then advanced into the proximal left subclavian, just proximal to the origin of the left vertebral artery. The Select catheter was removed and the 058 guide catheter was coaxially introduced over the microcatheter and microwire. The microcatheter was then advanced under roadmap guidance into the distal cervical left vertebral artery. The guide catheter was then advanced into the distal cervical left vertebral artery. The microcatheter was further advanced into the proximal basilar artery which allowed advancement of the guide catheter into the V4 segment, just proximal to the origin of the left PICA. The microcatheter was then removed and the coiling catheter introduced over the microwire. The catheter was then navigated into the aneurysm lumen over the microwire. The wire was then removed. The above coil was then placed and angiogram was taken. The coil was then deployed. It appeared that the aneurysm was largely  occluded. I therefore replaced the microwire and withdrew the coiling catheter. Angiogram was taken. The guide catheter was then withdrawn into the cervical vertebral artery and final control angiogram was taken. The guide catheter and guide sheath were then synchronously removed without incident. FINDINGS: Right internal carotid: head: Injection reveals the presence of a widely patent ICA, M1, and A1 segments and their branches. Previously seen coil mass at the right MCA bifurcation is again seen. Y stent configuration extending from the M1 into the major branches of the M2 is again seen. There is no in stent stenosis. There is no aneurysm residual or recurrence identified. 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 The parenchymal and venous phases are normal. The venous sinuses are widely patent. Left internal carotid: head: Injection reveals the presence of a widely patent ICA, A1, and M1 segments and their branches. Pipeline device is again seen extending from the horizontal cavernous to supraclinoid internal carotid artery. 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 Pattern of filling today appears to be less brisk than the prior angiogram in May of this year suggesting continued flow remodeling. The parenchymal and venous phases are normal. The venous sinuses are widely patent. Left vertebral: Injection reveals the presence of a widely patent vertebral artery. This leads to a widely patent basilar artery that terminates in bilateral P1. The basilar apex is normal. While the previous angiogram demonstrated a neck residual, angiogram today demonstrates  filling of the entire dome of the aneurysm, now measuring approximately 5.2 x 3.7 mm. The parenchymal and venous phases are normal. The venous sinuses are widely patent. Left vertebral, head (during embolization): Injection reveals widely patent left vertebral artery, and a widely patent left PICA. Coil appears well positioned within the aneurysm, without any coil prolapse. No filling defects are seen. There is no filling of the aneurysm dome. There is possible small neck remnant although this could also be the natural course of the parent vessel. Left vertebral, head (immediate  post embolization): Injection demonstrates widely patent left vertebral artery, basilar artery, and basilar bifurcation. The PICA is widely patent. Coil mass within the PICA aneurysm is stable. No aneurysm filling is identified. Left vertebral, head (final control): Injection reveals the presence of a widely patent vertebral artery. This leads to a widely patent basilar artery that terminates in bilateral P1. The basilar apex is normal. Coil mass within the PICA aneurysm is stable. The PICA remains widely patent. There is no filling defects. No aneurysm filling is seen. The parenchymal and venous phases are normal. The venous sinuses are widely patent. Right femoral: Normal vessel. No significant atherosclerotic disease. Arterial sheath in adequate position. DISPOSITION: Upon completion of the study, the femoral sheath was removed and hemostasis obtained using a 7-Fr ExoSeal closure device. Good proximal and distal lower extremity pulses were documented upon achievement of hemostasis. The procedure was well tolerated and no early complications were observed. The patient was transferred to the neuro critical care unit for further care. IMPRESSION: 1. 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 No aneurysm filling is seen post embolization 2. 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 3. 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 The preliminary results of this procedure were shared with the patient and the patient's family. Electronically Signed   By: Consuella Lose   On: 03/23/2018 18:23   Dg Chest Port 1 View  Result Date: 04/05/2018 CLINICAL DATA:  Respiratory failure.  Breast cancer.  Hypertension. EXAM: PORTABLE CHEST 1 VIEW COMPARISON:  04/04/2018 FINDINGS: Right internal jugular and PICC lines are unchanged in position. Patient rotated minimally right. Midline trachea. Normal heart size. No pleural effusion or pneumothorax. Biapical pleural thickening. Areas of subsegmental atelectasis at both lung bases. IMPRESSION: No acute cardiopulmonary disease. Similar subsegmental atelectasis in both lung bases. Electronically Signed   By: Abigail Miyamoto M.D.   On: 04/05/2018 08:05   Dg Chest Port 1 View  Result Date: 04/04/2018 CLINICAL DATA:  Respiratory failure EXAM: PORTABLE CHEST 1 VIEW COMPARISON:  04/03/2018 FINDINGS: Interval extubation. Mild patchy/platelike right lower lobe opacity, likely atelectasis. Left basilar opacity, likely atelectasis. No frank interstitial edema. No pleural effusion or pneumothorax. Heart is normal in size. Right arm PICC terminates at the cavoatrial junction. Right chest power port terminates in the mid SVC. IMPRESSION: Interval extubation. Mild patchy/platelike lower lobe opacities, likely atelectasis. Electronically Signed   By: Julian Hy M.D.   On: 04/04/2018 07:54   Dg Chest Port 1 View  Result Date:  04/03/2018 CLINICAL DATA:  Respiratory failure EXAM: PORTABLE CHEST 1 VIEW COMPARISON:  04/02/2018 FINDINGS: Endotracheal tube terminates 7.5 cm above the carina. Mild patchy right lower lobe opacity, likely atelectasis, improved. Right pleural effusion is improved. Left lung is essentially clear. No pneumothorax. Right chest power port terminates at the cavoatrial junction. The heart is normal in size. Enteric tube courses into the diaphragm. IMPRESSION: Endotracheal tube terminates 7.5 cm above the carina. Mild right lower lobe opacity, likely atelectasis, improved. Right pleural effusion is improved. Electronically Signed   By: Julian Hy M.D.   On: 04/03/2018 07:08   Dg Chest Port 1  View  Result Date: 04/02/2018 CLINICAL DATA:  Check endotracheal tube placement EXAM: PORTABLE CHEST 1 VIEW COMPARISON:  03/31/2018 FINDINGS: Cardiac shadow is mildly accentuated by the portable technique. Endotracheal tube, nasogastric catheter, right-sided chest wall port and right-sided PICC line are noted in satisfactory position. Right-sided pleural effusion is again seen and stable. Left-sided effusion has improved which may be positional in nature. Stable left retrocardiac atelectasis is noted. IMPRESSION: Tubes and lines as described. Stable left basilar atelectasis and right-sided pleural effusion. The left effusion appears improved although this may be positional in nature. Electronically Signed   By: Inez Catalina M.D.   On: 04/02/2018 07:23   Dg Chest Port 1 View  Result Date: 03/31/2018 CLINICAL DATA:  Check endotracheal tube placement EXAM: PORTABLE CHEST 1 VIEW COMPARISON:  03/30/2018 FINDINGS: Endotracheal tube, nasogastric catheter and right-sided PICC line are noted in satisfactory position. Previously placed right-sided chest wall port is noted and stable. Small bilateral effusions are again noted and stable. Some slight increased density in the left retrocardiac region is noted likely representing  atelectasis. No new focal abnormality is noted. IMPRESSION: Bilateral effusions and left retrocardiac atelectasis. This is stable from the previous day. Tubes and lines as described above. Electronically Signed   By: Inez Catalina M.D.   On: 03/31/2018 10:14   Dg Chest Port 1 View  Result Date: 03/30/2018 CLINICAL DATA:  62 year old female with metastatic breast cancer. Previously treated intracranial aneurysms. 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 EXAM: PORTABLE CHEST 1 VIEW COMPARISON:  03/29/2018 and earlier. FINDINGS: Portable AP semi upright view at 0543 hours. Endotracheal tube tip in good position between the level the clavicles and carina. Stable right chest power port, no longer accessed. Stable right IJ central line. Enteric tube courses to the left abdomen, tip not included but side hole appears stable at the gastric fundus level. Stable lung volumes in ventilation with right greater than left veiling bibasilar opacity. No pneumothorax or pulmonary edema. Mediastinal contours remain normal. Paucity of bowel gas in the upper abdomen. IMPRESSION: 1.  Stable lines and tubes. 2. Stable ventilation with small bilateral pleural effusions and lower lobe collapse or consolidation. Electronically Signed   By: Genevie Ann M.D.   On: 03/30/2018 09:19   Dg Chest Port 1 View  Result Date: 03/29/2018 CLINICAL DATA:  Endotracheal tube present EXAM: PORTABLE CHEST 1 VIEW COMPARISON:  Yesterday FINDINGS: Endotracheal tube tip just below the clavicular heads. An orogastric tube and side-port reaches the stomach. An upper extremity PICC has been placed with tip at the upper cavoatrial junction. Right porta catheter with tip at the SVC. Haziness of the lower chest likely from atelectasis and pleural fluid. No Kerley lines. No pneumothorax. Borderline heart size. IMPRESSION: 1. Interval placement of PICC that is in good position. 2. Hazy lower  lung opacities favoring atelectasis and pleural fluid. Electronically Signed   By: Monte Fantasia M.D.   On: 03/29/2018 07:20   Dg Chest Port 1 View  Result Date: 03/28/2018 CLINICAL DATA:  Endotracheal tube EXAM: PORTABLE CHEST 1 VIEW COMPARISON:  03/27/2018 FINDINGS: Endotracheal tube terminates 4 cm above the carina. Cardiomegaly with pulmonary vascular congestion. No frank interstitial edema. Right chest port terminates in the mid SVC. Small bilateral pleural effusions. Associated lower lobe opacities, likely atelectasis. IMPRESSION: Endotracheal tube terminates 4 cm above the carina. Cardiomegaly with pulmonary vascular congestion. No frank interstitial edema. Small bilateral pleural effusions. Associated lower lobe opacities, likely atelectasis.  Electronically Signed   By: Julian Hy M.D.   On: 03/28/2018 07:23   Dg Chest Port 1 View  Result Date: 03/27/2018 CLINICAL DATA:  Decreased oxygen saturation EXAM: PORTABLE CHEST 1 VIEW COMPARISON:  Chest radiograph 03/27/2018 FINDINGS: ET tube terminates in the mid trachea. Right anterior chest wall Port-A-Cath tip projects over the superior vena cava. Enteric tube courses inferior to the diaphragm. Multiple monitoring leads overlie the patient. Low lung volumes. Stable cardiomegaly. Bibasilar heterogeneous opacities. IMPRESSION: Low lung volumes with basilar opacities which may represent atelectasis or infection. ET tube mid trachea. Electronically Signed   By: Lovey Newcomer M.D.   On: 03/27/2018 16:12   Dg Chest Port 1 View  Result Date: 03/27/2018 CLINICAL DATA:  Respiratory failure EXAM: PORTABLE CHEST 1 VIEW COMPARISON:  Yesterday FINDINGS: Endotracheal tube tip at the clavicular heads. An orogastric tube tip reaches the stomach. Porta catheter on the right with high loop in the neck. Tip is at the distal SVC. Low volumes. There is no edema, consolidation, effusion, or pneumothorax. Normal heart size and mediastinal contours. IMPRESSION: 1.  Stable hardware positioning. 2. Stable low volume chest. Electronically Signed   By: Monte Fantasia M.D.   On: 03/27/2018 08:47   Dg Chest Port 1 View  Result Date: 03/26/2018 CLINICAL DATA:  Other complication of respirator. EXAM: PORTABLE CHEST 1 VIEW COMPARISON:  Radiograph 03/24/2018 FINDINGS: Endotracheal tube tip at the thoracic inlet. Enteric tube tip and side-port below the diaphragm. Right chest port with tip in the SVC. Unchanged heart size and mediastinal contours. Unchanged bibasilar atelectasis. No pulmonary edema, large pleural effusion or pneumothorax. IMPRESSION: 1. Unchanged support apparatus in satisfactory position. 2. Low lung volumes with mild bibasilar atelectasis. Electronically Signed   By: Keith Rake M.D.   On: 03/26/2018 05:48   Portable Chest X-ray  Result Date: 03/24/2018 CLINICAL DATA:  Intubated patient. Subarachnoid hemorrhage. Question aspiration EXAM: PORTABLE CHEST 1 VIEW COMPARISON:  Single view of the chest earlier today, 03/23/2018 and 12/24/2017. FINDINGS: Endotracheal tube is in place with the tip in good position just below the clavicular heads. NG tube is seen with the side-port in the stomach. Right IJ approach Port-A-Cath is noted. There is mild right basilar atelectasis. Lungs otherwise clear. No pneumothorax or pleural effusion. Heart size is normal. IMPRESSION: Mild bibasilar atelectasis.  Negative for evidence of aspiration. ETT and NG tube in good position. Electronically Signed   By: Inge Rise M.D.   On: 03/24/2018 12:06   Dg Chest Port 1 View  Result Date: 03/24/2018 CLINICAL DATA:  Hypoxia EXAM: PORTABLE CHEST 1 VIEW COMPARISON:  March 23, 2018 FINDINGS: Endotracheal tube tip is 4.4 cm above the carina. Nasogastric tube tip and side port are below the diaphragm. Port-A-Cath tip is in the superior vena cava. No pneumothorax. There is no appreciable edema or consolidation. Heart is slightly enlarged with pulmonary vascularity normal. No  adenopathy. No bone lesions. IMPRESSION: Tube and catheter positions as described without pneumothorax. No edema or consolidation. Stable cardiac silhouette. Electronically Signed   By: Lowella Grip III M.D.   On: 03/24/2018 07:28   Dg Chest Port 1 View  Result Date: 03/23/2018 CLINICAL DATA:  Respiratory failure EXAM: PORTABLE CHEST 1 VIEW COMPARISON:  03/22/2018 FINDINGS: Right Port-A-Cath remains in place, unchanged. Endotracheal tube and NG tube are stable. Heart is borderline in size. Mild peribronchial thickening. Bibasilar atelectasis. IMPRESSION: Bibasilar atelectasis.  Mild bronchitic changes. Electronically Signed   By: Rolm Baptise M.D.   On: 03/23/2018  09:21   Dg Chest Port 1 View  Result Date: 03/22/2018 CLINICAL DATA:  62 year old female post intubation. Shortness of breath. Subsequent encounter. EXAM: PORTABLE CHEST 1 VIEW COMPARISON:  03/22/2018 2:06 a.m. FINDINGS: Endotracheal tube tip midline 2.5 cm above the carina. Right MediPort catheter tip mid superior vena cava level. Nasogastric tube courses below the diaphragm. Tip is not included on the present exam. Biapical pleural thickening.  No pneumothorax detected. Prominent mediastinum consistent with mediastinal adenopathy as noted on 02/26/2018 chest CT. Heart appears slightly prominent which may be related to AP magnification poor inspiration. Pulmonary vascular congestion.  No segmental consolidation. IMPRESSION: 1. Endotracheal tube tip 2.5 cm above the carina. 2. Progressive pulmonary vascular congestion when compared to exam earlier today. 3. Mediastinal adenopathy. Electronically Signed   By: Genia Del M.D.   On: 03/22/2018 07:12   Dg Swallowing Func-speech Pathology  Result Date: 04/14/2018 Objective Swallowing Evaluation: Type of Study: MBS-Modified Barium Swallow Study  Patient Details Name: Jerika Wales MRN: [id number] Date of Birth: [birth year] Today's Date: 04/14/2018 Time: SLP Start Time (ACUTE ONLY): 1320 -SLP  Stop Time (ACUTE ONLY): 1350 SLP Time Calculation (min) (ACUTE ONLY): 30 min Past Medical History: Past Medical History: Diagnosis Date . Anemia   due to chemo . Anxiety  . Arthritis  . Cancer [facility])   breast cancer . Cerebral hemorrhage ([place]) 1986 . Depression   due to cancer dx . Diverticulosis  . GERD (gastroesophageal reflux disease)  . Headache  . History of hiatal hernia  . Hypertension  . 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 Neutropenic fever ([place])   Chemo related . Osteonecrosis ([place]) 2017-2018  Right Jaw bone . Thrush, oral   Chemo related Past Surgical History: Past Surgical History: Procedure Laterality Date . BREAST SURGERY Left 11/2016  metastic to bone . CATARACT EXTRACTION Right 03/02/2017 . CEREBRAL ANGIOGRAM  1986 . CESAREAN SECTION  1980 . IR 3D INDEPENDENT WKST  01/20/2017 . IR 3D INDEPENDENT WKST  05/26/2017 . IR ANGIO INTRA EXTRACRAN SEL COM CAROTID INNOMINATE UNI R MOD SED  04/09/2017 . IR ANGIO INTRA EXTRACRAN SEL INTERNAL CAROTID BILAT MOD SED  01/20/2017 . IR ANGIO INTRA EXTRACRAN SEL INTERNAL CAROTID BILAT MOD SED  11/17/2017 . IR ANGIO INTRA EXTRACRAN SEL INTERNAL CAROTID BILAT MOD SED  03/23/2018 . IR ANGIO INTRA EXTRACRAN SEL INTERNAL CAROTID UNI R MOD SED  03/13/2017 . IR ANGIO VERTEBRAL SEL VERTEBRAL BILAT MOD SED  01/20/2017 . IR ANGIO VERTEBRAL SEL VERTEBRAL UNI L MOD SED  05/26/2017 . IR ANGIO VERTEBRAL SEL VERTEBRAL UNI L MOD SED  11/17/2017 . IR ANGIOGRAM FOLLOW UP STUDY  03/13/2017 . IR ANGIOGRAM FOLLOW UP STUDY  03/13/2017 . IR ANGIOGRAM FOLLOW UP STUDY  03/13/2017 . IR ANGIOGRAM FOLLOW UP STUDY  03/13/2017 . IR ANGIOGRAM FOLLOW UP STUDY  03/13/2017 . IR ANGIOGRAM FOLLOW UP STUDY  03/13/2017 . IR ANGIOGRAM FOLLOW UP STUDY  03/13/2017 . IR ANGIOGRAM FOLLOW UP STUDY  03/13/2017 . IR ANGIOGRAM FOLLOW UP STUDY  03/13/2017 . IR ANGIOGRAM FOLLOW UP STUDY  03/13/2017 . IR ANGIOGRAM FOLLOW UP STUDY  03/13/2017 . IR ANGIOGRAM FOLLOW UP STUDY  03/13/2017 . IR ANGIOGRAM FOLLOW UP STUDY  04/09/2017 . IR ANGIOGRAM FOLLOW UP  STUDY  05/26/2017 . IR ANGIOGRAM FOLLOW UP STUDY  05/26/2017 . IR ANGIOGRAM FOLLOW UP STUDY  03/23/2018 . IR NEURO EACH ADD'L AFTER BASIC UNI LEFT (MS)  04/09/2017 . IR NEURO EACH ADD'L AFTER BASIC UNI LEFT (MS)  03/23/2018 . IR NEURO  EACH ADD'L AFTER BASIC UNI RIGHT (MS)  03/13/2017 . IR TRANSCATH/EMBOLIZ  03/13/2017 . IR TRANSCATH/EMBOLIZ  04/09/2017 . IR TRANSCATH/EMBOLIZ  05/26/2017 . IR TRANSCATH/EMBOLIZ  03/23/2018 . IR US GUIDE VASC ACCESS RIGHT  04/09/2017 . MASTECTOMY Left  . port a cathh  04/2016 . RADIOLOGY WITH ANESTHESIA N/A 03/13/2017  Procedure: stent coiling of right MCA aneurysm;  Surgeon: Consuella Lose, MD;  Location: [place];  Service: Radiology;  Laterality: N/A; . RADIOLOGY WITH ANESTHESIA N/A 04/09/2017  Procedure: Arteriogram, pipeline embolization of aneruysm;  Surgeon: Consuella Lose, MD;  Location: [place];  Service: Radiology;  Laterality: N/A; . RADIOLOGY WITH ANESTHESIA N/A 05/26/2017  Procedure: Stent supported coil embolization;  Surgeon: Consuella Lose, MD;  Location: [place];  Service: Radiology;  Laterality: N/A; . RADIOLOGY WITH ANESTHESIA N/A 03/23/2018  Procedure: IR WITH ANESTHESIA;  Surgeon: Consuella Lose, MD;  Location: [place];  Service: Radiology;  Laterality: N/A; . TUBAL LIGATION  1983 HPI: 62 yo female hx smoking, stage 4 breast cancer with brain met tx with chemo and brain XRT, cerebral aneurysms s/p intervention presented with persistent headache, and altered mental status. Found to have stable intraventricular hemorrhage as well as subarachnoid hemorrhage concentrated in left prepontine cistern and left CP angle cistern. Intubated 9/9-9/11 and re-intubated 9/11-9/21. CXR 9/22 mild patchy/platelike lower lobe opacities, likely atelectasis. Multiple attempts at NGT unsuccessful and pt has crucial po medication.   Subjective: alert, follows commands Assessment / Plan / Recommendation CHL IP CLINICAL IMPRESSIONS 04/14/2018 Clinical Impression Pt presents with improved  oropharyngeal swallow, due in large measure to improved cognition (level of alertness, spontaneity, response time).  Demonstrates mild oral delays but with active bolus manipulation and improved control in general.  Purees/soft solids reach just below valleculae at time of swallow trigger; the head of liquid boluses reaches pyriforms when swallow is triggered - for liquids, this leads to penetration of nectars before the swallow (PAS #2, ejected from larynx upon completion of swallow), and sensed aspiration of thin liquids.  Pt continues to aspirate posteriorly into trachea (trace amounts), likely still attributable to glottal incompetence s/p prolonged intubation.  Compensatory postures (head turns, chin tuck) had no notable effect on airway protection with thin liquids. There was trace vallecular residue post-swallow. Given pt's motivation to eat, recommend resuming PO diet - dysphagia 2, nectar-thick liquids; meds whole in puree.  SLP will follow for safety/diet progression. Pt's spouse was present for exam and agrees with plan. SLP Visit Diagnosis Dysphagia, oropharyngeal phase (R13.12) Attention and concentration deficit following -- Frontal lobe and executive function deficit following -- Impact on safety and function Mild aspiration risk   CHL IP TREATMENT RECOMMENDATION 04/14/2018 Treatment Recommendations Therapy as outlined in treatment plan below   Prognosis 04/14/2018 Prognosis for Safe Diet Advancement Good Barriers to Reach Goals -- Barriers/Prognosis Comment -- CHL IP DIET RECOMMENDATION 04/14/2018 SLP Diet Recommendations Dysphagia 2 (Fine chop) solids;Nectar thick liquid Liquid Administration via Cup;Straw Medication Administration Whole meds with puree Compensations Slow rate;Small sips/bites Postural Changes Remain semi-upright after after feeds/meals (Comment)   CHL IP OTHER RECOMMENDATIONS 04/14/2018 Recommended Consults -- Oral Care Recommendations Oral care BID Other Recommendations --   CHL IP  FOLLOW UP RECOMMENDATIONS 04/14/2018 Follow up Recommendations Inpatient Rehab   CHL IP FREQUENCY AND DURATION 04/14/2018 Speech Therapy Frequency (ACUTE ONLY) min 3x week Treatment Duration 2 weeks      CHL IP ORAL PHASE 04/14/2018 Oral Phase Impaired Oral - Pudding Teaspoon -- Oral - Pudding Cup -- Oral - Honey Teaspoon  Delayed oral transit Oral - Honey Cup -- Oral - Nectar Teaspoon WFL Oral - Nectar Cup WFL Oral - Nectar Straw WFL Oral - Thin Teaspoon WFL Oral - Thin Cup Decreased bolus cohesion Oral - Thin Straw Decreased bolus cohesion Oral - Puree Delayed oral transit Oral - Mech Soft Delayed oral transit Oral - Regular -- Oral - Multi-Consistency -- Oral - Pill -- Oral Phase - Comment --  CHL IP PHARYNGEAL PHASE 04/14/2018 Pharyngeal Phase Impaired Pharyngeal- Pudding Teaspoon -- Pharyngeal -- Pharyngeal- Pudding Cup -- Pharyngeal -- Pharyngeal- Honey Teaspoon Delayed swallow initiation-vallecula;Pharyngeal residue - valleculae Pharyngeal -- Pharyngeal- Honey Cup -- Pharyngeal -- Pharyngeal- Nectar Teaspoon Delayed swallow initiation-vallecula;Penetration/Aspiration before swallow;Pharyngeal residue - valleculae Pharyngeal Material enters airway, remains ABOVE vocal cords then ejected out Pharyngeal- Nectar Cup Delayed swallow initiation-vallecula;Penetration/Aspiration before swallow;Pharyngeal residue - valleculae Pharyngeal Material enters airway, remains ABOVE vocal cords then ejected out Pharyngeal- Nectar Straw Delayed swallow initiation-vallecula;Penetration/Aspiration before swallow;Pharyngeal residue - valleculae Pharyngeal Material enters airway, remains ABOVE vocal cords then ejected out Pharyngeal- Thin Teaspoon Delayed swallow initiation-pyriform sinuses;Penetration/Aspiration before swallow;Penetration/Aspiration during swallow Pharyngeal Material enters airway, remains ABOVE vocal cords and not ejected out Pharyngeal- Thin Cup Delayed swallow initiation-pyriform sinuses;Penetration/Aspiration  before swallow;Penetration/Aspiration during swallow;Trace aspiration Pharyngeal Material enters airway, passes BELOW cords and not ejected out despite cough attempt by patient Pharyngeal- Thin Straw Delayed swallow initiation-pyriform sinuses;Penetration/Aspiration before swallow;Penetration/Aspiration during swallow;Trace aspiration Pharyngeal Material enters airway, passes BELOW cords and not ejected out despite cough attempt by patient Pharyngeal- Puree Delayed swallow initiation-vallecula;Pharyngeal residue - valleculae Pharyngeal -- Pharyngeal- Mechanical Soft Delayed swallow initiation-vallecula;Pharyngeal residue - valleculae Pharyngeal -- Pharyngeal- Regular -- Pharyngeal -- Pharyngeal- Multi-consistency -- Pharyngeal -- Pharyngeal- Pill -- Pharyngeal -- Pharyngeal Comment --  No flowsheet data found. Juan Quam Laurice 04/14/2018, 3:04 PM              Dg Swallowing Func-speech Pathology  Result Date: 04/05/2018 Objective Swallowing Evaluation: Type of Study: MBS-Modified Barium Swallow Study  Patient Details Name: Diane Short MRN: [id number] Date of Birth: 11/03/1955 Today's Date: 04/05/2018 Time: SLP Start Time (ACUTE ONLY): 1110 -SLP Stop Time (ACUTE ONLY): 1136 SLP Time Calculation (min) (ACUTE ONLY): 26 min Past Medical History: Past Medical History: Diagnosis Date . Anemia   due to chemo . Anxiety  . Arthritis  . Cancer [facility])   breast cancer . Cerebral hemorrhage ([place]) 1986 . Depression   due to cancer dx . Diverticulosis  . GERD (gastroesophageal reflux disease)  . Headache  . History of hiatal hernia  . Hypertension  . 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 Neutropenic fever ([place])   Chemo related . Osteonecrosis ([place]) 2017-2018  Right Jaw bone . Thrush, oral   Chemo related Past Surgical History: Past Surgical History: Procedure Laterality Date . BREAST SURGERY Left 11/2016  metastic to bone . CATARACT EXTRACTION Right 03/02/2017 . CEREBRAL ANGIOGRAM  1986 . CESAREAN SECTION  1980 . IR 3D  INDEPENDENT [facility]KST  01/20/2017 . IR 3D INDEPENDENT WKST  05/26/2017 . IR ANGIO INTRA EXTRACRAN SEL COM CAROTID INNOMINATE UNI R MOD SED  04/09/2017 . IR ANGIO INTRA EXTRACRAN SEL INTERNAL CAROTID BILAT MOD SED  01/20/2017 . IR ANGIO INTRA EXTRACRAN SEL INTERNAL CAROTID BILAT MOD SED  11/17/2017 . IR ANGIO INTRA EXTRACRAN SEL INTERNAL CAROTID BILAT MOD SED  03/23/2018 . IR ANGIO INTRA EXTRACRAN SEL INTERNAL CAROTID UNI R MOD SED  03/13/2017 . IR ANGIO VERTEBRAL SEL VERTEBRAL BILAT MOD SED  01/20/2017 . IR ANGIO VERTEBRAL SEL VERTEBRAL UNI  L MOD SED  05/26/2017 . IR ANGIO VERTEBRAL SEL VERTEBRAL UNI L MOD SED  11/17/2017 . IR ANGIOGRAM FOLLOW UP STUDY  03/13/2017 . IR ANGIOGRAM FOLLOW UP STUDY  03/13/2017 . IR ANGIOGRAM FOLLOW UP STUDY  03/13/2017 . IR ANGIOGRAM FOLLOW UP STUDY  03/13/2017 . IR ANGIOGRAM FOLLOW UP STUDY  03/13/2017 . IR ANGIOGRAM FOLLOW UP STUDY  03/13/2017 . IR ANGIOGRAM FOLLOW UP STUDY  03/13/2017 . IR ANGIOGRAM FOLLOW UP STUDY  03/13/2017 . IR ANGIOGRAM FOLLOW UP STUDY  03/13/2017 . IR ANGIOGRAM FOLLOW UP STUDY  03/13/2017 . IR ANGIOGRAM FOLLOW UP STUDY  03/13/2017 . IR ANGIOGRAM FOLLOW UP STUDY  03/13/2017 . IR ANGIOGRAM FOLLOW UP STUDY  04/09/2017 . IR ANGIOGRAM FOLLOW UP STUDY  05/26/2017 . IR ANGIOGRAM FOLLOW UP STUDY  05/26/2017 . IR ANGIOGRAM FOLLOW UP STUDY  03/23/2018 . IR NEURO EACH ADD'L AFTER BASIC UNI LEFT (MS)  04/09/2017 . IR NEURO EACH ADD'L AFTER BASIC UNI LEFT (MS)  03/23/2018 . IR NEURO EACH ADD'L AFTER BASIC UNI RIGHT (MS)  03/13/2017 . IR TRANSCATH/EMBOLIZ  03/13/2017 . IR TRANSCATH/EMBOLIZ  04/09/2017 . IR TRANSCATH/EMBOLIZ  05/26/2017 . IR TRANSCATH/EMBOLIZ  03/23/2018 . IR US GUIDE VASC ACCESS RIGHT  04/09/2017 . MASTECTOMY Left  . port a cathh  04/2016 . RADIOLOGY WITH ANESTHESIA N/A 03/13/2017  Procedure: stent coiling of right MCA aneurysm;  Surgeon: Consuella Lose, MD;  Location: [place];  Service: Radiology;  Laterality: N/A; . RADIOLOGY WITH ANESTHESIA N/A 04/09/2017  Procedure: Arteriogram, pipeline  embolization of aneruysm;  Surgeon: Consuella Lose, MD;  Location: [place];  Service: Radiology;  Laterality: N/A; . RADIOLOGY WITH ANESTHESIA N/A 05/26/2017  Procedure: Stent supported coil embolization;  Surgeon: Consuella Lose, MD;  Location: [place];  Service: Radiology;  Laterality: N/A; . RADIOLOGY WITH ANESTHESIA N/A 03/23/2018  Procedure: IR WITH ANESTHESIA;  Surgeon: Consuella Lose, MD;  Location: [place];  Service: Radiology;  Laterality: N/A; . TUBAL LIGATION  1983 HPI: 62 yo female hx smoking, stage 4 breast cancer with brain met tx with chemo and brain XRT, cerebral aneurysms s/p intervention presented with persistent headache, and altered mental status. Found to have stable intraventricular hemorrhage as well as subarachnoid hemorrhage concentrated in left prepontine cistern and left CP angle cistern. Intubated 9/9-9/11 and re-intubated 9/11-9/21. CXR 9/22 mild patchy/platelike lower lobe opacities, likely atelectasis. Multiple attempts at NGT unsuccessful and pt has crucial po medication.   No data recorded Assessment / Plan / Recommendation CHL IP CLINICAL IMPRESSIONS 04/05/2018 Clinical Impression Pt demonstrates a moderate post extubation related dysphagia complicated by cognitive deficits. Pt needs total hand over hand assist to self feed due to weakness, verbal cues to take straw sips, verbal cues to initaite swallow. She orally holds most boluses for several seconds with good oral containment but intermittent slight delay in swallow initiation. With larger boluses of nectar this does result in trace sensed aspiration events before/during the swallow, apparently via the interarytenoid space where some glottic incompetence may be present after intubation. Study was quite limited as pt appeared to become nauseated. Pt is recommended to initiate a conservative diet of puree and and honey thick liquids with upgrade at bedside given consistent sensation on exam. Be aware of potential for  esophageal dysphagia, pt did have one instance of trace backflow that she sensed and swallowed.  SLP Visit Diagnosis Dysphagia, oropharyngeal phase (R13.12) Attention and concentration deficit following -- Frontal lobe and executive function deficit following -- Impact on safety and function Mild aspiration risk  CHL IP TREATMENT RECOMMENDATION 04/05/2018 Treatment Recommendations Therapy as outlined in treatment plan below   Prognosis 04/05/2018 Prognosis for Safe Diet Advancement Good Barriers to Reach Goals Cognitive deficits Barriers/Prognosis Comment -- CHL IP DIET RECOMMENDATION 04/05/2018 SLP Diet Recommendations Dysphagia 1 (Puree) solids;Honey thick liquids Liquid Administration via Cup;Straw Medication Administration Crushed with puree Compensations Slow rate;Small sips/bites Postural Changes Seated upright at 90 degrees;Remain semi-upright after after feeds/meals (Comment)   CHL IP OTHER RECOMMENDATIONS 04/05/2018 Recommended Consults -- Oral Care Recommendations Oral care BID Other Recommendations Order thickener from pharmacy   CHL IP FOLLOW UP RECOMMENDATIONS 04/05/2018 Follow up Recommendations Inpatient Rehab   CHL IP FREQUENCY AND DURATION 04/05/2018 Speech Therapy Frequency (ACUTE ONLY) min 2x/week Treatment Duration 2 weeks      CHL IP ORAL PHASE 04/05/2018 Oral Phase Impaired Oral - Pudding Teaspoon -- Oral - Pudding Cup -- Oral - Honey Teaspoon -- Oral - Honey Cup -- Oral - Nectar Teaspoon Reduced posterior propulsion;Holding of bolus Oral - Nectar Cup Reduced posterior propulsion;Holding of bolus Oral - Nectar Straw Reduced posterior propulsion;Holding of bolus Oral - Thin Teaspoon -- Oral - Thin Cup -- Oral - Thin Straw -- Oral - Puree Reduced posterior propulsion;Holding of bolus Oral - Mech Soft -- Oral - Regular -- Oral - Multi-Consistency -- Oral - Pill -- Oral Phase - Comment --  CHL IP PHARYNGEAL PHASE 04/05/2018 Pharyngeal Phase Impaired Pharyngeal- Pudding Teaspoon -- Pharyngeal --  Pharyngeal- Pudding Cup -- Pharyngeal -- Pharyngeal- Honey Teaspoon -- Pharyngeal -- Pharyngeal- Honey Cup -- Pharyngeal -- Pharyngeal- Nectar Teaspoon WFL Pharyngeal -- Pharyngeal- Nectar Cup Penetration/Aspiration before swallow;Penetration/Aspiration during swallow;Reduced airway/laryngeal closure;Trace aspiration Pharyngeal Material enters airway, passes BELOW cords then ejected out Pharyngeal- Nectar Straw Penetration/Aspiration before swallow;Penetration/Aspiration during swallow;Reduced airway/laryngeal closure;Trace aspiration Pharyngeal Material enters airway, passes BELOW cords then ejected out;Material does not enter airway Pharyngeal- Thin Teaspoon -- Pharyngeal -- Pharyngeal- Thin Cup -- Pharyngeal -- Pharyngeal- Thin Straw -- Pharyngeal -- Pharyngeal- Puree WFL Pharyngeal -- Pharyngeal- Mechanical Soft -- Pharyngeal -- Pharyngeal- Regular -- Pharyngeal -- Pharyngeal- Multi-consistency -- Pharyngeal -- Pharyngeal- Pill -- Pharyngeal -- Pharyngeal Comment --  No flowsheet data found. Herbie [place], [place] [phone] Othelia Pulling Katherene Ponto 04/05/2018, 12:20 PM              [place] Ekg Site Rite  Result Date: 03/28/2018 If Site Rite image not attached, placement could not be confirmed due to current cardiac rhythm.  Ir Angio Intra Extracran Sel Internal Carotid Bilat Mod Sed  Result Date: 03/23/2018 PROCEDURE: DIAGNOSTIC CEREBRAL ANGIOGRAM COIL EMBOLIZATION LEFT PICA ANEURYSM HISTORY: The patient is a 62 year old woman with history of multiple intracranial aneurysms which have been treated on an elective basis. She also has a history of metastatic breast cancer. Unfortunately, she presented to the emergency department with sudden onset of headache and altered mental status. She required intubation. CT scan demonstrated diffuse basal subarachnoid hemorrhage. Upon exam, she had good neurologic function, and the decision was made to proceed with angiogram for possible endovascular treatment of the  subarachnoid hemorrhage. ACCESS: The technical aspects of the procedure as well as its potential risks and benefits were reviewed with the patient's family. These risks included but were not limited to stroke, intracranial hemorrhage, bleeding, infection, allergic reaction, damage to organs or vital structures, stroke, non-diagnostic procedure, and the catastrophic outcomes of heart attack, coma, and death. With an understanding of these risks, informed consent was obtained and witnessed. The patient was placed in the supine position on the angiography  table and the skin of right groin prepped in the usual sterile fashion. The procedure was performed under general anesthesia. A 5- French sheath was introduced in the right common femoral artery using Seldinger technique. MEDICATIONS: HEPARIN: 0 Units total. CONTRAST:  33m OMNIPAQUE IOHEXOL 300 MG/ML  SOLNcc, Omnipaque 300 FLUOROSCOPY TIME:  FLUOROSCOPY TIME: 11.9 combined AP and lateral minutes TECHNIQUE: CATHETERS AND WIRES 5-French JB-1 catheter 180 cm 0.035" glidewire 6-French NeuronMax guide sheath 6-French Berenstein Select JB-1 catheter 0.058" CatV guidecatheter 150 cm Marksman microcatheter Synchro 2 standard microwire Excelsior XT-17 microcatheter COILS USED Target XL soft 457mx 6cm VESSELS CATHETERIZED Right internal carotid Left internal carotid Left vertebral Right common femoral Left posterior inferior cerebellar artery VESSELS STUDIED Right internal carotid, head Left internal carotid, head Left vertebral Left vertebral artery, head (during embolization) Left vertebral artery, head (immediate post-embolization) Left vertebral artery, head (final control) Right common femoral PROCEDURAL NARRATIVE A 5-Fr JB-1 glide catheter was advanced over a 0.035 glidewire into the aortic arch. The above vessels were then sequentially catheterized and cervical / cerebral angiograms taken. After review of images, the catheter was removed without incident. The 5-Fr  sheath was then exchanged over the glidewire for an 8-Fr sheath. Under real-time fluoroscopy, the guide sheath was advanced over the select catheter and glidewire into the descending aorta. The select catheter was then advanced into the left subclavian artery. The guide sheath was then advanced into the proximal left subclavian, just proximal to the origin of the left vertebral artery. The Select catheter was removed and the 058 guide catheter was coaxially introduced over the microcatheter and microwire. The microcatheter was then advanced under roadmap guidance into the distal cervical left vertebral artery. The guide catheter was then advanced into the distal cervical left vertebral artery. The microcatheter was further advanced into the proximal basilar artery which allowed advancement of the guide catheter into the V4 segment, just proximal to the origin of the left PICA. The microcatheter was then removed and the coiling catheter introduced over the microwire. The catheter was then navigated into the aneurysm lumen over the microwire. The wire was then removed. The above coil was then placed and angiogram was taken. The coil was then deployed. It appeared that the aneurysm was largely occluded. I therefore replaced the microwire and withdrew the coiling catheter. Angiogram was taken. The guide catheter was then withdrawn into the cervical vertebral artery and final control angiogram was taken. The guide catheter and guide sheath were then synchronously removed without incident. FINDINGS: Right internal carotid: head: Injection reveals the presence of a widely patent ICA, M1, and A1 segments and their branches. Previously seen coil mass at the right MCA bifurcation is again seen. Y stent configuration extending from the M1 into the major branches of the M2 is again seen. There is no in stent stenosis. There is no aneurysm residual or recurrence identified. 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 The parenchymal and venous phases are normal. The venous sinuses are widely patent. Left internal carotid: head: Injection reveals the presence of a widely patent ICA, A1, and M1 segments and their branches. Pipeline device is again seen extending from the horizontal cavernous to supraclinoid internal carotid artery. 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 Pattern of filling today appears to be less brisk than the prior angiogram in May of this year suggesting continued flow remodeling. The parenchymal and venous phases are normal. The venous sinuses are widely patent. Left vertebral: Injection reveals the presence of a widely patent vertebral artery. This leads to a widely patent basilar artery that terminates in bilateral P1. The basilar apex is normal. While the previous angiogram demonstrated a neck residual, angiogram today demonstrates filling of the entire dome of the aneurysm, now measuring approximately 5.2 x 3.7 mm. The parenchymal and venous phases are normal. The venous sinuses are widely patent. Left vertebral, head (during embolization): Injection reveals widely patent left vertebral artery, and a widely patent left PICA. Coil appears well positioned within the aneurysm, without any coil prolapse. No filling defects are seen. There is no filling of the aneurysm dome. There is possible small neck remnant although this could also be the natural course of the parent vessel. Left vertebral, head (immediate post embolization): Injection demonstrates widely patent left vertebral artery, basilar artery, and basilar bifurcation. The PICA is widely patent. Coil mass  within the PICA aneurysm is stable. No aneurysm filling is identified. Left vertebral, head (final control): Injection reveals the presence of a widely patent vertebral artery. This leads to a widely patent basilar artery that terminates in bilateral P1. The basilar apex is normal. Coil mass within the PICA aneurysm is stable. The PICA remains widely patent. There is no filling defects. No aneurysm filling is seen. The parenchymal and venous phases are normal. The venous sinuses are widely patent. Right femoral: Normal vessel. No significant atherosclerotic disease. Arterial sheath in adequate position. DISPOSITION: Upon completion of the study, the femoral sheath was removed and hemostasis obtained using a 7-Fr ExoSeal closure device. Good proximal and distal lower extremity pulses were documented upon achievement of hemostasis. The procedure was well tolerated and no early complications were observed. The patient was transferred to the neuro critical care unit for further care. IMPRESSION: 1. 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 No aneurysm filling is seen post embolization 2. 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 3. 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 The preliminary results of this procedure were shared with the patient and the patient's family. Electronically Signed   By: Consuella Lose   On: 03/23/2018 18:23   Ir Neuro Each Add'l After Basic Uni Left (ms)  Result Date: 03/23/2018 PROCEDURE: DIAGNOSTIC  CEREBRAL ANGIOGRAM COIL EMBOLIZATION LEFT PICA ANEURYSM HISTORY: The patient is a 62 year old woman with history of multiple intracranial aneurysms which have been treated on an elective basis. She also has a history of metastatic breast cancer. Unfortunately, she presented to the emergency department with sudden onset of headache and altered mental status. She required intubation. CT scan demonstrated diffuse basal subarachnoid hemorrhage. Upon exam, she had good neurologic function, and the decision was made to proceed with angiogram for possible endovascular treatment of the subarachnoid hemorrhage. ACCESS: The technical aspects of the procedure as well as its potential risks and benefits were reviewed with the patient's family. These risks included but were not  limited to stroke, intracranial hemorrhage, bleeding, infection, allergic reaction, damage to organs or vital structures, stroke, non-diagnostic procedure, and the catastrophic outcomes of heart attack, coma, and death. With an understanding of these risks, informed consent was obtained and witnessed. The patient was placed in the supine position on the angiography table and the skin of right groin prepped in the usual sterile fashion. The procedure was performed under general anesthesia. A 5- French sheath was introduced in the right common femoral artery using Seldinger technique. MEDICATIONS: HEPARIN: 0 Units total. CONTRAST:  30m OMNIPAQUE IOHEXOL 300 MG/ML  SOLNcc, Omnipaque 300 FLUOROSCOPY TIME:  FLUOROSCOPY TIME: 11.9 combined AP and lateral minutes TECHNIQUE: CATHETERS AND WIRES 5-French JB-1 catheter 180 cm 0.035" glidewire 6-French NeuronMax guide sheath 6-French Berenstein Select JB-1 catheter 0.058" CatV guidecatheter 150 cm Marksman microcatheter Synchro 2 standard microwire Excelsior XT-17 microcatheter COILS USED Target XL soft 460mx 6cm VESSELS CATHETERIZED Right internal carotid Left internal carotid Left vertebral Right common femoral  Left posterior inferior cerebellar artery VESSELS STUDIED Right internal carotid, head Left internal carotid, head Left vertebral Left vertebral artery, head (during embolization) Left vertebral artery, head (immediate post-embolization) Left vertebral artery, head (final control) Right common femoral PROCEDURAL NARRATIVE A 5-Fr JB-1 glide catheter was advanced over a 0.035 glidewire into the aortic arch. The above vessels were then sequentially catheterized and cervical / cerebral angiograms taken. After review of images, the catheter was removed without incident. The 5-Fr sheath was then exchanged over the glidewire for an 8-Fr sheath. Under real-time fluoroscopy, the guide sheath was advanced over the select catheter and glidewire into the descending aorta. The select catheter was then advanced into the left subclavian artery. The guide sheath was then advanced into the proximal left subclavian, just proximal to the origin of the left vertebral artery. The Select catheter was removed and the 058 guide catheter was coaxially introduced over the microcatheter and microwire. The microcatheter was then advanced under roadmap guidance into the distal cervical left vertebral artery. The guide catheter was then advanced into the distal cervical left vertebral artery. The microcatheter was further advanced into the proximal basilar artery which allowed advancement of the guide catheter into the V4 segment, just proximal to the origin of the left PICA. The microcatheter was then removed and the coiling catheter introduced over the microwire. The catheter was then navigated into the aneurysm lumen over the microwire. The wire was then removed. The above coil was then placed and angiogram was taken. The coil was then deployed. It appeared that the aneurysm was largely occluded. I therefore replaced the microwire and withdrew the coiling catheter. Angiogram was taken. The guide catheter was then withdrawn into the cervical  vertebral artery and final control angiogram was taken. The guide catheter and guide sheath were then synchronously removed without incident. FINDINGS: Right internal carotid: head: Injection reveals the presence of a widely patent ICA, M1, and A1 segments and their branches. Previously seen coil mass at the right MCA bifurcation is again seen. Y stent configuration extending from the M1 into the major branches of the M2 is again seen. There is no in stent stenosis. There is no aneurysm residual or recurrence identified. 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 The parenchymal and venous phases are normal. The venous  sinuses are widely patent. Left internal carotid: head: Injection reveals the presence of a widely patent ICA, A1, and M1 segments and their branches. Pipeline device is again seen extending from the horizontal cavernous to supraclinoid internal carotid artery. 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 Pattern of filling today appears to be less brisk than the prior angiogram in May of this year suggesting continued flow remodeling. The parenchymal and venous phases are normal. The venous sinuses are widely patent. Left vertebral: Injection reveals the presence of a widely patent vertebral artery. This leads to a widely patent basilar artery that terminates in bilateral P1. The basilar apex is normal. While the previous angiogram demonstrated a neck residual, angiogram today demonstrates filling of the entire dome of the aneurysm, now measuring approximately 5.2 x 3.7 mm. The parenchymal and venous phases are normal. The venous sinuses are widely  patent. Left vertebral, head (during embolization): Injection reveals widely patent left vertebral artery, and a widely patent left PICA. Coil appears well positioned within the aneurysm, without any coil prolapse. No filling defects are seen. There is no filling of the aneurysm dome. There is possible small neck remnant although this could also be the natural course of the parent vessel. Left vertebral, head (immediate post embolization): Injection demonstrates widely patent left vertebral artery, basilar artery, and basilar bifurcation. The PICA is widely patent. Coil mass within the PICA aneurysm is stable. No aneurysm filling is identified. Left vertebral, head (final control): Injection reveals the presence of a widely patent vertebral artery. This leads to a widely patent basilar artery that terminates in bilateral P1. The basilar apex is normal. Coil mass within the PICA aneurysm is stable. The PICA remains widely patent. There is no filling defects. No aneurysm filling is seen. The parenchymal and venous phases are normal. The venous sinuses are widely patent. Right femoral: Normal vessel. No significant atherosclerotic disease. Arterial sheath in adequate position. DISPOSITION: Upon completion of the study, the femoral sheath was removed and hemostasis obtained using a 7-Fr ExoSeal closure device. Good proximal and distal lower extremity pulses were documented upon achievement of hemostasis. The procedure was well tolerated and no early complications were observed. The patient was transferred to the neuro critical care unit for further care. IMPRESSION: 1. 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 No aneurysm filling is seen post embolization 2. 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 3. Stable to slightly improved appearance of miniscule filling of the left  internal carotid artery aneurysm previously treated with the pipeline embolization device in comparison to prior angiogram of May 2019 4. 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 The preliminary results of this procedure were shared with the patient and the patient's family. Electronically Signed   By: Consuella Lose   On: 03/23/2018 18:23    Subjective:  Patient denies interval complains, no fever, cough, chest pain, headache or dizziness.  Discharge Exam: Vitals:   04/16/18 0753 04/16/18 1123  BP:  (!) 159/87  Pulse: 89 94  Resp:  15  Temp: 98.1 F (36.7 C) 98.1 F (36.7 C)  SpO2: 94% 99%   Vitals:   04/16/18 0300 04/16/18 0400 04/16/18 0753 04/16/18 1123  BP: 117/61   (!) 159/87  Pulse: 72  89 94  Resp: 15   15  Temp: 97.9 F (36.6 C) 98.1 F (36.7 C) 98.1 F (36.7 C) 98.1 F (36.7 C)  TempSrc: Oral Oral Oral Oral  SpO2: 98% 98% 94% 99%  Weight: 102 kg     Height:       General: Pt is alert, awake, communicative not in acute distress, obese Cardiovascular: RRR, S1/S2 +, no rubs, no gallops Respiratory: CTA bilaterally, no wheezing, no rhonchi Abdominal: Soft, NT, ND, bowel sounds + CNS: non focal.  Alert awake communicative Extremities: no edema, no cyanosis.  Generalized weakness noted  The results of significant diagnostics from this hospitalization (including imaging, microbiology, ancillary and laboratory) are listed below for reference.    Microbiology: Recent Results (from the past 240 hour(s))  Urine Culture     Status: None   Collection Time: 04/07/18  8:00 PM  Result Value Ref Range Status   Specimen Description URINE, CATHETERIZED  Final   Special Requests NONE  Final   Culture   Final    NO GROWTH Performed at [facility], [address] [place], [place] [address]    Report Status 04/09/2018 FINAL  Final      Labs: BNP (last 3 results) No results for input(s): BNP in the last 8760 hours. Basic Metabolic Panel: Recent Labs  Lab 04/12/18 0620 04/13/18 0511 04/14/18 0205 04/15/18 0531 04/16/18 0600  NA 136 141 138 138 140  K 4.1 4.1 3.8 3.6 3.7  CL 111 108 104 105 102  CO2 20* _0 GLUCOSE 221* 129* 140* 105* 106*  BUN _1 CREATININE 0.66 0.70 0.62 0.58 0.63  CALCIUM 8.0* 8.0* 8.7* 8.6* 9.2  MG  --   --   --  2.3  --    Liver Function Tests: Recent Labs  Lab 04/10/18 0434 04/13/18 0511  AST 23 27  ALT 25 22  ALKPHOS 60 81  BILITOT 0.5 0.5  PROT 6.2* 5.8*  ALBUMIN 2.8* 2.8*   No results for input(s): LIPASE, AMYLASE in the last 168 hours. Recent Labs  Lab 04/10/18 0434  AMMONIA 21   CBC: Recent Labs  Lab 04/10/18 0434 04/11/18 0419 04/13/18 0511 04/15/18 0531 04/16/18 0600  WBC 8.5 10.5 10.6* 5.8 6.6  HGB 8.5* 8.3* 8.1* 8.4* 8.5*  HCT 29.9* 29.7* 27.2* 28.5* 28.4*  MCV 109.1* 109.2* 104.6* 104.0* 103.3*  PLT 224 208 156 169 182   Cardiac Enzymes: No results for input(s): CKTOTAL, CKMB, CKMBINDEX, TROPONINI in the last 168 hours. BNP: Invalid input(s): POCBNP CBG: Recent Labs  Lab 04/14/18 1626 04/14/18 1952 04/14/18 2335 04/15/18 0424 04/15/18 1139  GLUCAP 108* 136* 114* 103* 108*   D-Dimer No results for input(s): DDIMER in the last 72 hours. Hgb A1c No results for input(s): HGBA1C in the last 72 hours. Lipid Profile No results for input(s): CHOL, HDL, LDLCALC, TRIG, CHOLHDL, LDLDIRECT in the last 72 hours. Thyroid function studies No results for input(s): TSH, T4TOTAL, T3FREE, THYROIDAB in the last 72 hours.  Invalid input(s): FREET3 Anemia work up No results for input(s): VITAMINB12, FOLATE, FERRITIN, TIBC, IRON, RETICCTPCT in the last 72 hours. Urinalysis    Component Value Date/Time   COLORURINE YELLOW 04/07/2018 2146   APPEARANCEUR HAZY (A) 04/07/2018 2146   LABSPEC 1.033 (H) 04/07/2018 2146   PHURINE 5.0  04/07/2018 2146   GLUCOSEU NEGATIVE 04/07/2018 2146   HGBUR NEGATIVE 04/07/2018 2146   BILIRUBINUR NEGATIVE 04/07/2018 2146   [place] NEGATIVE 04/07/2018  2146   PROTEINUR 30 (A) 04/07/2018 2146   NITRITE NEGATIVE 04/07/2018 2146   LEUKOCYTESUR NEGATIVE 04/07/2018 2146   Sepsis Labs Invalid input(s): PROCALCITONIN,  WBC,  LACTICIDVEN Microbiology Recent Results (from the past 240 hour(s))  Urine Culture     Status: None   Collection Time: 04/07/18  8:00 PM  Result Value Ref Range Status   Specimen Description URINE, CATHETERIZED  Final   Special Requests NONE  Final   Culture   Final    NO GROWTH Performed at [facility], [address] [place], [place] [address]    Report Status 04/09/2018 FINAL  Final    Please note: You were cared for by a hospitalist during your hospital stay. Once you are discharged, your primary care physician will handle any further medical issues. Please note that NO REFILLS for any discharge medications will be authorized once you are discharged, as it is imperative that you return to your primary care physician (or establish a relationship with a primary care physician if you do not have one) for your post hospital discharge needs so that they can reassess your need for medications and monitor your lab values.  Time coordinating discharge: 40 minutes  SIGNED:  Flora Lipps, MD  [facility] 04/16/2018, 12:57 PM Pager [phone]  If 7PM-7AM, please contact night-coverage [email or web address] Password [email or web address]

## 2018-04-16 NOTE — Progress Notes (Signed)
PMR Admission Coordinator Pre-Admission Assessment  Patient: Diane Short is an 62 y.o., female MRN: [id number] DOB: 12-30-1955 Height: '5\' 4"'  (162.6 cm) Weight: 102 kg                                                                                                                                                  Insurance Information HMO:     PPO:      PCP:      IPA:      80/20:      OTHER:  PRIMARY: Medicaid of [employer]      Policy#: [id number]      Subscriber: Patient CM Name:       Phone#:      Fax#:  Pre-Cert#:       Employer:  Benefits:  Phone #: NA     Name: verified eligibility online via [employer] on 04/16/18 Coverage Code: MAFWN Eff. Date: checked on 04/16/18     Deduct:       Out of Pocket Max:       Life Max:  CIR: Covered per Medicaid Guidelines       SNF:  Outpatient:      Co-Pay:  Home Health:       Co-Pay:  DME:      Co-Pay:  Providers:  SECONDARY:       Policy#:       Subscriber:  CM Name:       Phone#:      Fax#:  Pre-Cert#:       Employer:  Benefits:  Phone #:      Name:  Eff. Date:      Deduct:       Out of Pocket Max:       Life Max:  CIR:       SNF:  Outpatient:      Co-Pay:  Home Health:       Co-Pay:  DME:      Co-Pay:  Medicaid Application Date:       Case Manager:  Disability Application Date:       Case Worker:   Emergency Contact Information         Contact Information    Name Relation Home Work [place] Spouse [phone]  [phone]   Diane Short Daughter   [phone]     Current Medical History  Patient Admitting Diagnosis: Right hemiplegia, aphasia, severe cognitive deficits and dysphagia secondary to aneurysmal bleed in a patient with stage IV breastCAwho has finished a second round of palliative chemotherapy  History of Present Illness: Diane Short is a 62 year old right-handed female who is married reportedly independent up until few weeks ago with complicated medical history to include progressive  stage IV progressive breast cancer followed by Dr. Hinton Rao with current completion of second cycle of chemotherapy for  palliative management. Patient recently had radiation stereotactic surgery for single solitary brain met as well as history of 5 intracerebral aneurysms 3 of which treated endovascularly. Noted progressive decline over the last few weeks with intermittent headaches. She was seen by EMS 03/22/2018 taken to [facility] for unresponsive episode requiring intubation. CT scan of the head showed subarachnoid hemorrhage and subsequently transferred to [facility] for further treatment. EEG completed showing general background slowing and no seizure activity. Echocardiogram with ejection fraction of 54% grade 1 diastolic dysfunction. CT angiogram of head and neck showed increased volume of subarachnoid hemorrhage predominantly located in the basal cisterns and sylvian fissures with intraventricular extension in a pattern most consistent with aneurysmal hemorrhage. 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 Kathyrn Sheriff. She remained intubated until 04/03/2018. Maintained on Dilantin/Kepprafor seizure prophylaxis.Nimotopfor blood pressure controlwhich has been completed. Subcutaneous. Lovenox initiated for DVT prophylaxis 03/31/2018. She is on a dysphagia #2 nectar thick liquid diet. Hospital course palliative care follow-up therapy evaluations completed with recommendations of physical medicine rehab consult. Patient is to be admitted for a comprehensive rehab program on 04/16/18.   Complete NIHSS TOTAL: 19  Past Medical History      Past Medical History:  Diagnosis Date  . 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 Anxiety   . Arthritis   . Cancer [facility])    breast cancer  . Cerebral hemorrhage ([place]) 1986  . Depression    due to cancer dx  . Diverticulosis   . GERD (gastroesophageal reflux disease)   . Headache   . History of hiatal  hernia   . Hypertension   . 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 Neutropenic fever ([place])    Chemo related  . Osteonecrosis ([place]) 2017-2018   Right Jaw bone  . Thrush, oral    Chemo related    Family History  family history includes Alzheimer's disease in her mother; Cerebral aneurysm in her father; Colon cancer in her father; Diabetes in her sister; Esophageal cancer in her brother; Hypertension in her sister; Multiple myeloma in her brother; Stomach cancer in her brother.  Prior Rehab/Hospitalizations:  Has the patient had major surgery during 100 days prior to admission? No  Current Medications   Current Facility-Administered Medications:  .  0.9 %  sodium chloride infusion, , Intravenous, PRN, Consuella Lose, MD, Stopped at 04/10/18 1537 .  [DISCONTINUED] acetaminophen (TYLENOL) tablet 650 mg, 650 mg, Oral, Q4H PRN, 650 mg at 04/08/18 0423 **OR** acetaminophen (TYLENOL) solution 650 mg, 650 mg, Per Tube, Q4H PRN, 650 mg at 04/16/18 1139 **OR** [DISCONTINUED] acetaminophen (TYLENOL) suppository 650 mg, 650 mg, Rectal, Q4H PRN, Chesley Mires, MD, 650 mg at 04/04/18 0012 .  amLODipine (NORVASC) tablet 10 mg, 10 mg, Per Tube, Daily, Cherene Altes, MD, 10 mg at 04/16/18 1139 .  carvedilol (COREG) tablet 25 mg, 25 mg, Per Tube, BID WC, Cherene Altes, MD, 25 mg at 04/16/18 1140 .  Chlorhexidine Gluconate Cloth 2 % PADS 6 each, 6 each, Topical, Daily, Consuella Lose, MD, 6 each at 04/14/18 1000 .  cholecalciferol (D-VI-SOL) 400 UNIT/ML oral liquid 800 Units, 800 Units, Per Tube, Daily, Cherene Altes, MD, 800 Units at 04/16/18 1149 .  enoxaparin (LOVENOX) injection 30 mg, 30 mg, Subcutaneous, Q24H, Consuella Lose, MD, 30 mg at 04/16/18 1139 .  famotidine (PEPCID) tablet 20 mg, 20 mg, Per Tube, Daily, Cherene Altes, MD, 20 mg at 04/16/18 1140 .  feeding  supplement (ENSURE ENLIVE) (ENSURE ENLIVE) liquid 237 mL, 237 mL, Oral, BID BM, Pokhrel, Laxman, MD, 237  mL at 04/15/18 1434 .  Gerhardt's butt cream, , Topical, QID, Cherene Altes, MD, 1 application at 05/16/14 2256 .  hydrALAZINE (APRESOLINE) injection 10 mg, 10 mg, Intravenous, Q4H PRN, Patrecia Pour, Christean Grief, MD, 10 mg at 04/11/18 0618 .  labetalol (NORMODYNE) tablet 100 mg, 100 mg, Per Tube, TID, Cherene Altes, MD, 100 mg at 04/16/18 1140 .  levETIRAcetam (KEPPRA) 100 MG/ML solution 750 mg, 750 mg, Per Tube, BID, Greta Doom, MD, 750 mg at 04/16/18 1139 .  loperamide HCl (IMODIUM) 1 MG/7.5ML suspension 2 mg, 2 mg, Per Tube, Q6H, Cherene Altes, MD, 2 mg at 04/16/18 1150 .  MEDLINE mouth rinse, 15 mL, Mouth Rinse, BID, Pokhrel, Laxman, MD, 15 mL at 04/16/18 1152 .  metoprolol tartrate (LOPRESSOR) injection 2.5-5 mg, 2.5-5 mg, Intravenous, Q3H PRN, Patrecia Pour, Christean Grief, MD, 5 mg at 04/09/18 0853 .  ondansetron (ZOFRAN-ODT) disintegrating tablet 4 mg, 4 mg, Oral, Q6H PRN **OR** ondansetron (ZOFRAN) injection 4 mg, 4 mg, Intravenous, Q6H PRN, Halford Chessman, Vineet, MD .  phenytoin (DILANTIN) 125 MG/5ML suspension 100 mg, 100 mg, Per Tube, TID, Cherene Altes, MD, 100 mg at 04/16/18 1149 .  QUEtiapine (SEROQUEL) tablet 25 mg, 25 mg, Oral, QHS, Cherene Altes, MD, 25 mg at 04/15/18 2255 .  RESOURCE THICKENUP CLEAR, , Oral, PRN, Pokhrel, Laxman, MD .  sodium chloride flush (NS) 0.9 % injection 10-40 mL, 10-40 mL, Intracatheter, Q12H, Consuella Lose, MD, 10 mL at 04/16/18 1152 .  sodium chloride flush (NS) 0.9 % injection 10-40 mL, 10-40 mL, Intracatheter, PRN, Cherene Altes, MD, 20 mL at 04/10/18 0323  Patients Current Diet:     Diet Order                  Diet - low sodium heart healthy         DIET DYS 2 Room service appropriate? Yes; Fluid consistency: Nectar Thick  Diet effective now               Precautions / Restrictions Precautions Precautions: Fall Restrictions Weight Bearing Restrictions: No   Has the patient had 2 or more falls or a  fall with injury in the past year?No  Prior Activity Level Community (5-7x/wk): active PTA, retired but Independent in tasks and mobility; went out to meet friends  Development worker, international aid / [place] Devices/Equipment: Grab bars in shower, Hand-held shower hose Home Equipment: Grab bars - tub/shower  Prior Device Use: Indicate devices/aids used by the patient prior to current illness, exacerbation or injury? None of the above  Prior Functional Level Prior Function Level of Independence: Independent Comments: drove; husband occasionally helpes with drying off legs due to unsteadiness  Self Care: Did the patient need help bathing, dressing, using the toilet or eating?  Independent  Indoor Mobility: Did the patient need assistance with walking from room to room (with or without device)? Independent  Stairs: Did the patient need assistance with internal or external stairs (with or without device)? Independent  Functional Cognition: Did the patient need help planning regular tasks such as shopping or remembering to take medications? Independent  Current Functional Level Cognition  Arousal/Alertness: Awake/alert Overall Cognitive Status: Impaired/Different from baseline Current Attention Level: Selective Orientation Level: Disoriented to situation, Oriented to person, Oriented to place, Oriented to time Following Commands: Follows one step commands consistently, Follows one step commands with increased  time Safety/Judgement: Decreased awareness of safety, Decreased awareness of deficits General Comments: oriented to place, time, situation Attention: Focused, Sustained Focused Attention: Appears intact Sustained Attention: Impaired Sustained Attention Impairment: Verbal basic, Functional basic Memory: Impaired Memory Impairment: Storage deficit Awareness: Impaired Awareness Impairment: Emergent impairment Problem Solving: Impaired Problem Solving  Impairment: Verbal basic, Functional basic Executive Function: Self Monitoring, Initiating Initiating: Impaired Initiating Impairment: Verbal basic, Functional basic Self Monitoring: Impaired Self Monitoring Impairment: Verbal basic, Functional basic    Extremity Assessment (includes Sensation/Coordination)  Upper Extremity Assessment: Generalized weakness, LUE deficits/detail RUE Deficits / Details: Pt able to attempt to comb front section of hair with support at Rt elbow  RUE Sensation: decreased light touch RUE Coordination: decreased fine motor, decreased gross motor LUE Deficits / Details: Lt UE tremulous, and pt tends to use Rt UE for all tasks unless cued.  By end of session, pt was noted to gesture spontaneously with both Lt and Rt hands while telling jokes  LUE Sensation: decreased light touch LUE Coordination: decreased gross motor, decreased fine motor  Lower Extremity Assessment: Defer to PT evaluation RLE Deficits / Details: grossly 3-/5, quads locking out in standing. RLE Coordination: decreased fine motor LLE Deficits / Details: grossly 3-/5, quads 2+ LLE Coordination: decreased fine motor    ADLs  Overall ADL's : Needs assistance/impaired Eating/Feeding: Maximal assistance Grooming: Oral care, Wash/dry face, Maximal assistance, Sitting Grooming Details (indicate cue type and reason): Pt able to comb the front portion of her hair, but requires assist to complete task  Upper Body Bathing: Maximal assistance, Bed level Lower Body Bathing: Maximal assistance, Sit to/from stand Upper Body Dressing : Maximal assistance, Sitting Lower Body Dressing: Total assistance, Bed level Toilet Transfer: Moderate assistance, +2 for physical assistance, +2 for safety/equipment, BSC Toilet Transfer Details (indicate cue type and reason): using stedy  Toileting- Clothing Manipulation and Hygiene: Maximal assistance, Sit to/from stand Toileting - Clothing Manipulation Details (indicate  cue type and reason): incontinent of BM; total A to clean; apparent moisture assisted skin damage Functional mobility during ADLs: Moderate assistance, +2 for physical assistance, +2 for safety/equipment General ADL Comments: total A all ADL    Mobility  Overal bed mobility: Needs Assistance Bed Mobility: Rolling, Sidelying to Sit Rolling: Mod assist Sidelying to sit: Mod assist Supine to sit: Mod assist, HOB elevated Sit to supine: Min assist Sit to sidelying: Total assist, +2 for physical assistance General bed mobility comments: able to move her feet to EOB, assist to scoot, used rail    Transfers  Overall transfer level: Needs assistance Equipment used: Ambulation equipment used Transfer via Lift Equipment: Stedy Transfers: Sit to/from Stand Sit to Stand: Mod assist, +2 physical assistance Squat pivot transfers: Total assist, +2 physical assistance General transfer comment: from elevated surface stood up pulling up on stedy with increased time to reach railing with dysmetria, lifting assist, cues and assist for anterior weight shift; stood from bed to [facility] then from [facility] with RW stood for hygiene, but LE's fatigued and unable to take steps so sat on [facility] and got to recliner with stedy.     Ambulation / Gait / Stairs / Wheelchair Mobility  Ambulation/Gait Ambulation/Gait assistance: (NT) General Gait Details: not able    Posture / Balance Dynamic Sitting Balance Sitting balance - Comments: cues and minguard for safety due to lateral lean Balance Overall balance assessment: Needs assistance Sitting-balance support: Feet supported Sitting balance-Leahy Scale: Poor Sitting balance - Comments: cues and minguard for safety due to lateral lean  Postural control: Right lateral lean Standing balance support: Bilateral upper extremity supported Standing balance-Leahy Scale: Poor Standing balance comment: posterior lean in standing fatigues quickly, stood approx 40 seconds for  hygiene and donning briefs; sit to stand x 3; tremors noted in standing    Special needs/care consideration BiPAP/CPAP: no CPM: no Continuous Drip IV: no Dialysis: no        Days: NA Life Vest: no Oxygen: no Special Bed: no Trach Size: no Wound Vac (area): no      Location: no Skin: abrasion to knees, ecchymosis bilateral arms and legs, moisture associated damage to groin and buttocks, petechiae on bilateral arms, skin teat to right buttocks; from 04/09/18: documented pressure injury stage III to anus; right groin incision area from 03/23/18.                          Bowel mgmt: last BM: 04/16/18 per pt; 04/13/18 in chart Bladder mgmt:pur[place], continent Diabetic mgmt: no per pt report; glucose measured in chart     Previous Home Environment Living Arrangements: Spouse/significant other  Lives With: Spouse Available Help at Discharge: Family, Available 24 hours/day Type of Home: [place]: One level, Other (Comment)(1 step to den/kitchen) Home Access: Level entry Bathroom Shower/Tub: Tub/shower unit, Architectural technologist: Standard Bathroom Accessibility: Yes How Accessible: Accessible via wheelchair [place]: No  Discharge Living Setting Plans for Discharge Living Setting: Patient's home, Lives with (comment)(lives with husband) Type of Home at Discharge: House Discharge Home Layout: One level Discharge Home Access: Level entry(1 small ledge to enter) Discharge Bathroom Shower/Tub: Tub/shower unit Discharge Bathroom Toilet: Standard Discharge Bathroom Accessibility: Yes How Accessible: Accessible via wheelchair, Accessible via walker Does the patient have any problems obtaining your medications?: No  Social/Family/Support Systems Patient Roles: Spouse Contact Information: emergency contact is spouse: [name]  Anticipated Caregiver: [name] (husband) and daughter lives in town and can assist (but works 3pm-11pm at an [place]) [place]: husband: [phone] (home: [phone]); daughter [place]: [phone]) Ability/Limitations of Caregiver: Mod A per husband Caregiver Availability: 24/7 Discharge Plan Discussed with Primary Caregiver: Yes(with pt and husband) Is Caregiver In Agreement with Plan?: Yes Does Caregiver/Family have Issues with Lodging/Transportation while Pt is in Rehab?: No   Goals/Additional Needs Patient/Family Goal for Rehab: PT/OT/SLP: Mod A Expected length of stay: 5-7 days Cultural Considerations: Christian Dietary Needs: DYS 2, nectar thick Equipment Needs: TBD based on function Pt/Family Agrees to Admission and willing to participate: Yes Program Orientation Provided & Reviewed with Pt/Caregiver Including Roles  & Responsibilities: Yes(with pt and husband )  Barriers to Discharge: Lack of/limited family support, Pending chemo/radiation(pt will need to be +1 Mod A for transfers)   Decrease burden of Care through IP rehab admission: Specialzed equipment needs, Decrease number of caregivers and Patient/family education Family and pt understand CIR stay will be to reduce burden of care to allow pt to return home for a 1 person transfer assist and enable pt to be care by husband at home for ADLs.    Possible need for SNF placement upon discharge: Not anticipated; Pt's husband is able to provide 24/7 care and plans to take pt home if able to transfer safely.    Patient Condition: This patient's medical and functional status has changed since the consult dated 04/07/18 in which the Rehabilitation Physician documented that the patient was not appropriate for intensive rehabilitative care in an inpatient rehabilitation facility. Due to change in status and issues  being addressed, patient's case has been discussed with Dr. Naaman Plummer and patient now appropriate for inpatient rehabilitation. Pt has improved nutrition intake per family, has demonstrated improvement in alertness, participation,  initiation, and endurance per therapy, and family contnue to want to pursue rehab to enable pt to return home with her husband for care. Will admit to inpatient rehab today on 04/16/18.   Preadmission Screen Completed By:  Jhonnie Garner, 04/16/2018 1:09 PM ______________________________________________________________________   Discussed status with Dr. Naaman Plummer on 04/16/18 at 1:08PM and received telephone approval for admission today.  Admission Coordinator:  Jhonnie Garner, time 1:08PM Sudie Grumbling 04/16/18           Cosigned by: Meredith Staggers, MD at 04/16/2018 1:15 PM  Revision History

## 2018-04-16 NOTE — Progress Notes (Signed)
  NEUROSURGERY PROGRESS NOTE   No issues overnight. {  EXAM:  BP 117/61 (BP Location: Left Leg)   Pulse 89   Temp 98.1 F (36.7 C) (Oral)   Resp 15   Ht 5\' 4"  (1.626 m)   Wt 102 kg   LMP 08/14/2010 (Exact Date)   SpO2 94%   BMI 38.60 kg/m   Awake, alert, oriented  Speech slow but appropriate MAE symmetrically. Generalized weakness. No focal deficits  IMPRESSION:  62 y.o. female s/p SAH, neurologically stable. - Worked with therapy yesterday. Appropriate for CIR. Consult placed. - Cleared medically when bed available

## 2018-04-16 NOTE — Progress Notes (Signed)
Physical Medicine and Rehabilitation Consult Reason for Consult: Decreased functional mobility Referring Physician: [facility]   HPI: Diane Short is a 62 y.o. right-handed female who is married and reported to be independent up until few weeks ago with complicated medical history to include progressive stage IV breast cancer followed by Dr. Hinton Rao with current completion of second cycle of chemotherapy for palliative management.  Patient recently had radiation stereotactic radiosurgery for a single solitary brain met as well as history of 5 intracerebral aneurysms 3 of which treated endovascularly.  Noted progressive decline over the last few weeks with intermittent headaches..  She was seen by EMS 03/22/2018 taken to [facility] for unresponsive episode requiring intubation.  CT scan showed subarachnoid hemorrhage and subsequently transferred to [facility] for further treatment.  EEG completed showing general background slowing and no epileptiform activity noted.  Echocardiogram with ejection fraction of 37% grade 1 diastolic dysfunction.  CT angiogram of head and neck showed increased volume of subarachnoid hemorrhage predominantly located in the basal cisterns and sylvian fissures with intraventricular extension in a pattern most consistent with aneurysmal hemorrhage.  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 Kathyrn Sheriff.  Patient remained intubated until 04/03/2018.  Remained on Keppra for seizure prophylaxis.Nimotop ongoing for blood pressure control.  Subcutaneous Lovenox initiated for DVT prophylaxis 03/31/2018.  Patient is currently completing a course of Unasyn for aspiration pneumonia while maintained on a dysphagia #1 honey thick liquid diet.  Therapy evaluation completed 04/06/2018 with recommendations of physical medicine rehab consult.  SLP working with pt on swallow Husband at bedside, stating pt hasn't eaten "enough for a bird" since Sunday RN at  bedside indicating constant diarrhea requiring rectal tube Review of Systems  Constitutional: Negative for chills and fever.  HENT: Negative for hearing loss.   Eyes: Positive for blurred vision.  Respiratory: Negative for cough and shortness of breath.   Cardiovascular: Negative for chest pain, palpitations and leg swelling.  Gastrointestinal: Positive for nausea.       GERD  Genitourinary: Negative for dysuria, flank pain and hematuria.  Musculoskeletal: Positive for myalgias.  Skin: Negative for rash.  Neurological: Positive for headaches.  Psychiatric/Behavioral: Positive for depression.       Anxiety  All other systems reviewed and are negative.      Past Medical History:  Diagnosis Date  . 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 Anxiety   . Arthritis   . Cancer [facility])    breast cancer  . Cerebral hemorrhage ([place]) 1986  . Depression    due to cancer dx  . Diverticulosis   . GERD (gastroesophageal reflux disease)   . Headache   . History of hiatal hernia   . Hypertension   . 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 Neutropenic fever ([place])    Chemo related  . Osteonecrosis ([place]) 2017-2018   Right Jaw bone  . Thrush, oral    Chemo related        Past Surgical History:  Procedure Laterality Date  . BREAST SURGERY Left 11/2016   metastic to bone  . CATARACT EXTRACTION Right 03/02/2017  . CEREBRAL ANGIOGRAM  1986  . CESAREAN SECTION  1980  . IR 3D INDEPENDENT WKST  01/20/2017  . IR 3D INDEPENDENT WKST  05/26/2017  . IR ANGIO INTRA EXTRACRAN SEL COM CAROTID INNOMINATE UNI R MOD SED  04/09/2017  . IR ANGIO INTRA EXTRACRAN SEL INTERNAL CAROTID BILAT MOD SED  01/20/2017  .  IR ANGIO INTRA EXTRACRAN SEL INTERNAL CAROTID BILAT MOD SED  11/17/2017  . IR ANGIO INTRA EXTRACRAN SEL INTERNAL CAROTID BILAT MOD SED  03/23/2018  . IR ANGIO INTRA EXTRACRAN SEL INTERNAL CAROTID UNI R MOD SED  03/13/2017  . IR ANGIO VERTEBRAL SEL VERTEBRAL BILAT MOD SED  01/20/2017  . IR ANGIO  VERTEBRAL SEL VERTEBRAL UNI L MOD SED  05/26/2017  . IR ANGIO VERTEBRAL SEL VERTEBRAL UNI L MOD SED  11/17/2017  . IR ANGIOGRAM FOLLOW UP STUDY  03/13/2017  . IR ANGIOGRAM FOLLOW UP STUDY  03/13/2017  . IR ANGIOGRAM FOLLOW UP STUDY  03/13/2017  . IR ANGIOGRAM FOLLOW UP STUDY  03/13/2017  . IR ANGIOGRAM FOLLOW UP STUDY  03/13/2017  . IR ANGIOGRAM FOLLOW UP STUDY  03/13/2017  . IR ANGIOGRAM FOLLOW UP STUDY  03/13/2017  . IR ANGIOGRAM FOLLOW UP STUDY  03/13/2017  . IR ANGIOGRAM FOLLOW UP STUDY  03/13/2017  . IR ANGIOGRAM FOLLOW UP STUDY  03/13/2017  . IR ANGIOGRAM FOLLOW UP STUDY  03/13/2017  . IR ANGIOGRAM FOLLOW UP STUDY  03/13/2017  . IR ANGIOGRAM FOLLOW UP STUDY  04/09/2017  . IR ANGIOGRAM FOLLOW UP STUDY  05/26/2017  . IR ANGIOGRAM FOLLOW UP STUDY  05/26/2017  . IR ANGIOGRAM FOLLOW UP STUDY  03/23/2018  . IR NEURO EACH ADD'L AFTER BASIC UNI LEFT (MS)  04/09/2017  . IR NEURO EACH ADD'L AFTER BASIC UNI LEFT (MS)  03/23/2018  . IR NEURO EACH ADD'L AFTER BASIC UNI RIGHT (MS)  03/13/2017  . IR TRANSCATH/EMBOLIZ  03/13/2017  . IR TRANSCATH/EMBOLIZ  04/09/2017  . IR TRANSCATH/EMBOLIZ  05/26/2017  . IR TRANSCATH/EMBOLIZ  03/23/2018  . IR US GUIDE VASC ACCESS RIGHT  04/09/2017  . MASTECTOMY Left   . port a cathh  04/2016  . RADIOLOGY WITH ANESTHESIA N/A 03/13/2017   Procedure: stent coiling of right MCA aneurysm;  Surgeon: Consuella Lose, MD;  Location: [place];  Service: Radiology;  Laterality: N/A;  . RADIOLOGY WITH ANESTHESIA N/A 04/09/2017   Procedure: Arteriogram, pipeline embolization of aneruysm;  Surgeon: Consuella Lose, MD;  Location: [place];  Service: Radiology;  Laterality: N/A;  . RADIOLOGY WITH ANESTHESIA N/A 05/26/2017   Procedure: Stent supported coil embolization;  Surgeon: Consuella Lose, MD;  Location: [place];  Service: Radiology;  Laterality: N/A;  . RADIOLOGY WITH ANESTHESIA N/A 03/23/2018   Procedure: IR WITH ANESTHESIA;  Surgeon: Consuella Lose, MD;   Location: [place];  Service: Radiology;  Laterality: N/A;  . TUBAL LIGATION  1983        Family History  Problem Relation Age of Onset  . Alzheimer's disease Mother   . Colon cancer Father   . Cerebral aneurysm Father   . Diabetes Sister   . Stomach cancer Brother   . Esophageal cancer Brother   . Multiple myeloma Brother   . Hypertension Sister    Social History:  reports that she has been smoking cigarettes. She has a 21.00 pack-year smoking history. She has never used smokeless tobacco. She reports that she does not drink alcohol or use drugs. Allergies:       Allergies  Allergen Reactions  . Nsaids Other (See Comments)    GI upset   . Ace Inhibitors Other (See Comments)    cough         Medications Prior to Admission  Medication Sig Dispense Refill  . acetaminophen (TYLENOL) 500 MG tablet Take 1,000 mg by mouth 2 (two) times daily as needed  for mild pain.     Marland Kitchen aspirin 81 MG tablet Take 81 mg by mouth daily.     . Calcium Citrate-Vitamin D (CALCIUM CITRATE + D3 PO) Take 1-2 tablets by mouth 2 (two) times daily. Takes 2 in the am and 1 in the evening    . carvedilol (COREG) 25 MG tablet Take 25 mg by mouth 2 (two) times daily with a meal.    . citalopram (CELEXA) 20 MG tablet Take 20 mg by mouth daily.    . diphenhydrAMINE (BENADRYL) 25 MG tablet Take 25 mg by mouth at bedtime as needed for sleep.    Marland Kitchen loperamide (IMODIUM A-D) 2 MG tablet Take 2-4 mg by mouth daily as needed for diarrhea or loose stools.    Marland Kitchen loratadine (CLARITIN) 10 MG tablet Take 10 mg by mouth daily.    Marland Kitchen LORazepam (ATIVAN) 0.5 MG tablet Take 0.5 mg by mouth every 4 (four) hours as needed for anxiety.    Marland Kitchen omeprazole (PRILOSEC) 20 MG capsule Take 20 mg by mouth daily.    . ondansetron (ZOFRAN) 4 MG tablet Take 4 mg by mouth every 4 (four) hours as needed for nausea or vomiting.    . prochlorperazine (COMPAZINE) 10 MG tablet Take 10 mg by mouth every 6 (six) hours as  needed for nausea or vomiting.    . traMADol (ULTRAM) 50 MG tablet Take 50 mg by mouth every 6 (six) hours as needed for moderate pain.      Home: Home Living Family/patient expects to be discharged to:: Private residence Living Arrangements: Spouse/significant other Available Help at Discharge: Family, Available 24 hours/day Type of Home: House Home Access: Level entry [place]: One level, Other (Comment)(1 step to den/kitchen) Bathroom Shower/Tub: Tub/shower unit, Architectural technologist: Standard Bathroom Accessibility: Yes Home Equipment: Grab bars - tub/shower  Lives With: Spouse  Functional History: Prior Function Level of Independence: Independent Comments: drove; husband occasionally helpes with drying off legs due to unsteadiness Functional Status:  Mobility: Bed Mobility Overal bed mobility: Needs Assistance Bed Mobility: Supine to Sit, Sit to Supine Supine to sit: Max assist, +2 for physical assistance Sit to supine: Max assist, +2 for physical assistance General bed mobility comments: Pt initiates movement for bed mobility;  Transfers Overall transfer level: Needs assistance Equipment used: 2 person hand held assist Transfers: Sit to/from Stand Sit to Stand: +2 physical assistance, Max assist General transfer comment: gati belt and bed pad used Ambulation/Gait General Gait Details: not able  ADL: ADL Overall ADL's : Needs assistance/impaired Eating/Feeding: Maximal assistance Grooming: Maximal assistance Upper Body Bathing: Maximal assistance, Bed level Lower Body Bathing: Total assistance Upper Body Dressing : Total assistance Lower Body Dressing: Total assistance, Bed level Functional mobility during ADLs: Maximal assistance, +2 for physical assistance(sit - stand) General ADL Comments: increased attempts at participation once upright in bed; easily distracted during ADL  Cognition: Cognition Overall Cognitive Status: Impaired/Different  from baseline Arousal/Alertness: Awake/alert Orientation Level: Oriented to person Attention: Focused, Sustained Focused Attention: Appears intact Sustained Attention: Impaired Sustained Attention Impairment: Verbal basic, Functional basic Memory: Impaired Memory Impairment: Storage deficit Awareness: Impaired Awareness Impairment: Emergent impairment Problem Solving: Impaired Problem Solving Impairment: Verbal basic, Functional basic Executive Function: Self Monitoring, Initiating Initiating: Impaired Initiating Impairment: Verbal basic, Functional basic Self Monitoring: Impaired Self Monitoring Impairment: Verbal basic, Functional basic Cognition Arousal/Alertness: Awake/alert Behavior During Therapy: Flat affect Overall Cognitive Status: Impaired/Different from baseline Area of Impairment: Orientation, Attention, Memory, Following commands, Safety/judgement, Awareness, Problem solving Orientation  Level: Disoriented to, Place([place]) Current Attention Level: Sustained Memory: Decreased recall of precautions, Decreased short-term memory Following Commands: Follows one step commands with increased time Safety/Judgement: Decreased awareness of safety, Decreased awareness of deficits Awareness: Intellectual Problem Solving: Slow processing, Decreased initiation, Difficulty sequencing, Requires verbal cues, Requires tactile cues  Blood pressure (!) 162/63, pulse 95, temperature 99.6 F (37.6 C), temperature source Oral, resp. rate 14, height _0  (1.626 m), weight 102 kg, last menstrual period 08/14/2010, SpO2 99 %. Physical Exam  Vitals reviewed. HENT:  Head: Normocephalic.  Eyes:  Pupils reactive to light  Neck: Normal range of motion. Neck supple. No thyromegaly present.  Cardiovascular: Normal rate and regular rhythm.  Respiratory: Effort normal and breath sounds normal. No respiratory distress.  GI: Soft. Bowel sounds are normal. She exhibits no distension.    Neurological:  Patient is a bit lethargic but arousable.  Husband at bedside.  She will state her name and age.  Follows simple commands  Patient has difficulty with naming fingers correctly.  Cannot follow commands for visual field confrontation testing. Sensation cannot be assessed secondary to aphasia. Patient has long latency prior to response with single word answers. Motor strength is 0/5 in the right deltoid bicep tricep grip, 2- hip knee extensor synergy in the right lower limb otherwise 0/5 Left upper limb and left lower limb are 4/5   LabResultsLast24Hours       Results for orders placed or performed during the hospital encounter of 03/22/18 (from the past 24 hour(s))  Glucose, capillary     Status: Abnormal   Collection Time: 04/06/18  7:50 AM  Result Value Ref Range   Glucose-Capillary 126 (H) 70 - 99 mg/dL   Comment 1 Notify RN    Comment 2 Document in Chart   Glucose, capillary     Status: Abnormal   Collection Time: 04/06/18 12:24 PM  Result Value Ref Range   Glucose-Capillary 141 (H) 70 - 99 mg/dL   Comment 1 Notify RN    Comment 2 Document in Chart   Glucose, capillary     Status: Abnormal   Collection Time: 04/06/18  4:30 PM  Result Value Ref Range   Glucose-Capillary 110 (H) 70 - 99 mg/dL   Comment 1 Notify RN    Comment 2 Document in Chart   Glucose, capillary     Status: Abnormal   Collection Time: 04/06/18  9:26 PM  Result Value Ref Range   Glucose-Capillary 140 (H) 70 - 99 mg/dL      ImagingResults(Last48hours)  Dg Swallowing Func-speech Pathology  Result Date: 04/05/2018 Objective Swallowing Evaluation: Type of Study: MBS-Modified Barium Swallow Study  Patient Details Name: Diane Short MRN: [id number] Date of Birth: [birth year] Today's Date: 04/05/2018 Time: SLP Start Time (ACUTE ONLY): 1110 -SLP Stop Time (ACUTE ONLY): 1136 SLP Time Calculation (min) (ACUTE ONLY): 26 min Past Medical History: Past Medical History:  Diagnosis Date . Anemia   due to chemo . Anxiety  . Arthritis  . Cancer [facility])   breast cancer . Cerebral hemorrhage ([place]) 1986 . Depression   due to cancer dx . Diverticulosis  . GERD (gastroesophageal reflux disease)  . Headache  . History of hiatal hernia  . Hypertension  . 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 Neutropenic fever ([place])   Chemo related . Osteonecrosis ([place]) 2017-2018  Right Jaw bone . Thrush, oral   Chemo related Past Surgical History: Past Surgical History: Procedure Laterality Date . BREAST SURGERY Left 11/2016  metastic to  bone . CATARACT EXTRACTION Right 03/02/2017 . CEREBRAL ANGIOGRAM  1986 . CESAREAN SECTION  1980 . IR 3D INDEPENDENT WKST  01/20/2017 . IR 3D INDEPENDENT WKST  05/26/2017 . IR ANGIO INTRA EXTRACRAN SEL COM CAROTID INNOMINATE UNI R MOD SED  04/09/2017 . IR ANGIO INTRA EXTRACRAN SEL INTERNAL CAROTID BILAT MOD SED  01/20/2017 . IR ANGIO INTRA EXTRACRAN SEL INTERNAL CAROTID BILAT MOD SED  11/17/2017 . IR ANGIO INTRA EXTRACRAN SEL INTERNAL CAROTID BILAT MOD SED  03/23/2018 . IR ANGIO INTRA EXTRACRAN SEL INTERNAL CAROTID UNI R MOD SED  03/13/2017 . IR ANGIO VERTEBRAL SEL VERTEBRAL BILAT MOD SED  01/20/2017 . IR ANGIO VERTEBRAL SEL VERTEBRAL UNI L MOD SED  05/26/2017 . IR ANGIO VERTEBRAL SEL VERTEBRAL UNI L MOD SED  11/17/2017 . IR ANGIOGRAM FOLLOW UP STUDY  03/13/2017 . IR ANGIOGRAM FOLLOW UP STUDY  03/13/2017 . IR ANGIOGRAM FOLLOW UP STUDY  03/13/2017 . IR ANGIOGRAM FOLLOW UP STUDY  03/13/2017 . IR ANGIOGRAM FOLLOW UP STUDY  03/13/2017 . IR ANGIOGRAM FOLLOW UP STUDY  03/13/2017 . IR ANGIOGRAM FOLLOW UP STUDY  03/13/2017 . IR ANGIOGRAM FOLLOW UP STUDY  03/13/2017 . IR ANGIOGRAM FOLLOW UP STUDY  03/13/2017 . IR ANGIOGRAM FOLLOW UP STUDY  03/13/2017 . IR ANGIOGRAM FOLLOW UP STUDY  03/13/2017 . IR ANGIOGRAM FOLLOW UP STUDY  03/13/2017 . IR ANGIOGRAM FOLLOW UP STUDY  04/09/2017 . IR ANGIOGRAM FOLLOW UP STUDY  05/26/2017 . IR ANGIOGRAM FOLLOW UP STUDY  05/26/2017 . IR ANGIOGRAM FOLLOW UP STUDY  03/23/2018 . IR NEURO EACH  ADD'L AFTER BASIC UNI LEFT (MS)  04/09/2017 . IR NEURO EACH ADD'L AFTER BASIC UNI LEFT (MS)  03/23/2018 . IR NEURO EACH ADD'L AFTER BASIC UNI RIGHT (MS)  03/13/2017 . IR TRANSCATH/EMBOLIZ  03/13/2017 . IR TRANSCATH/EMBOLIZ  04/09/2017 . IR TRANSCATH/EMBOLIZ  05/26/2017 . IR TRANSCATH/EMBOLIZ  03/23/2018 . IR US GUIDE VASC ACCESS RIGHT  04/09/2017 . MASTECTOMY Left  . port a cathh  04/2016 . RADIOLOGY WITH ANESTHESIA N/A 03/13/2017  Procedure: stent coiling of right MCA aneurysm;  Surgeon: Consuella Lose, MD;  Location: [place];  Service: Radiology;  Laterality: N/A; . RADIOLOGY WITH ANESTHESIA N/A 04/09/2017  Procedure: Arteriogram, pipeline embolization of aneruysm;  Surgeon: Consuella Lose, MD;  Location: [place];  Service: Radiology;  Laterality: N/A; . RADIOLOGY WITH ANESTHESIA N/A 05/26/2017  Procedure: Stent supported coil embolization;  Surgeon: Consuella Lose, MD;  Location: [place];  Service: Radiology;  Laterality: N/A; . RADIOLOGY WITH ANESTHESIA N/A 03/23/2018  Procedure: IR WITH ANESTHESIA;  Surgeon: Consuella Lose, MD;  Location: [place];  Service: Radiology;  Laterality: N/A; . TUBAL LIGATION  1983 HPI: 62 yo female hx smoking, stage 4 breast cancer with brain met tx with chemo and brain XRT, cerebral aneurysms s/p intervention presented with persistent headache, and altered mental status. Found to have stable intraventricular hemorrhage as well as subarachnoid hemorrhage concentrated in left prepontine cistern and left CP angle cistern. Intubated 9/9-9/11 and re-intubated 9/11-9/21. CXR 9/22 mild patchy/platelike lower lobe opacities, likely atelectasis. Multiple attempts at NGT unsuccessful and pt has crucial po medication.   No data recorded Assessment / Plan / Recommendation CHL IP CLINICAL IMPRESSIONS 04/05/2018 Clinical Impression Pt demonstrates a moderate post extubation related dysphagia complicated by cognitive deficits. Pt needs total hand over hand assist to self feed due to weakness,  verbal cues to take straw sips, verbal cues to initaite swallow. She orally holds most boluses for several seconds with good oral containment but intermittent slight delay in  swallow initiation. With larger boluses of nectar this does result in trace sensed aspiration events before/during the swallow, apparently via the interarytenoid space where some glottic incompetence may be present after intubation. Study was quite limited as pt appeared to become nauseated. Pt is recommended to initiate a conservative diet of puree and and honey thick liquids with upgrade at bedside given consistent sensation on exam. Be aware of potential for esophageal dysphagia, pt did have one instance of trace backflow that she sensed and swallowed.  SLP Visit Diagnosis Dysphagia, oropharyngeal phase (R13.12) Attention and concentration deficit following -- Frontal lobe and executive function deficit following -- Impact on safety and function Mild aspiration risk   CHL IP TREATMENT RECOMMENDATION 04/05/2018 Treatment Recommendations Therapy as outlined in treatment plan below   Prognosis 04/05/2018 Prognosis for Safe Diet Advancement Good Barriers to Reach Goals Cognitive deficits Barriers/Prognosis Comment -- CHL IP DIET RECOMMENDATION 04/05/2018 SLP Diet Recommendations Dysphagia 1 (Puree) solids;Honey thick liquids Liquid Administration via Cup;Straw Medication Administration Crushed with puree Compensations Slow rate;Small sips/bites Postural Changes Seated upright at 90 degrees;Remain semi-upright after after feeds/meals (Comment)   CHL IP OTHER RECOMMENDATIONS 04/05/2018 Recommended Consults -- Oral Care Recommendations Oral care BID Other Recommendations Order thickener from pharmacy   CHL IP FOLLOW UP RECOMMENDATIONS 04/05/2018 Follow up Recommendations Inpatient Rehab   CHL IP FREQUENCY AND DURATION 04/05/2018 Speech Therapy Frequency (ACUTE ONLY) min 2x/week Treatment Duration 2 weeks      CHL IP ORAL PHASE 04/05/2018 Oral Phase  Impaired Oral - Pudding Teaspoon -- Oral - Pudding Cup -- Oral - Honey Teaspoon -- Oral - Honey Cup -- Oral - Nectar Teaspoon Reduced posterior propulsion;Holding of bolus Oral - Nectar Cup Reduced posterior propulsion;Holding of bolus Oral - Nectar Straw Reduced posterior propulsion;Holding of bolus Oral - Thin Teaspoon -- Oral - Thin Cup -- Oral - Thin Straw -- Oral - Puree Reduced posterior propulsion;Holding of bolus Oral - Mech Soft -- Oral - Regular -- Oral - Multi-Consistency -- Oral - Pill -- Oral Phase - Comment --  CHL IP PHARYNGEAL PHASE 04/05/2018 Pharyngeal Phase Impaired Pharyngeal- Pudding Teaspoon -- Pharyngeal -- Pharyngeal- Pudding Cup -- Pharyngeal -- Pharyngeal- Honey Teaspoon -- Pharyngeal -- Pharyngeal- Honey Cup -- Pharyngeal -- Pharyngeal- Nectar Teaspoon WFL Pharyngeal -- Pharyngeal- Nectar Cup Penetration/Aspiration before swallow;Penetration/Aspiration during swallow;Reduced airway/laryngeal closure;Trace aspiration Pharyngeal Material enters airway, passes BELOW cords then ejected out Pharyngeal- Nectar Straw Penetration/Aspiration before swallow;Penetration/Aspiration during swallow;Reduced airway/laryngeal closure;Trace aspiration Pharyngeal Material enters airway, passes BELOW cords then ejected out;Material does not enter airway Pharyngeal- Thin Teaspoon -- Pharyngeal -- Pharyngeal- Thin Cup -- Pharyngeal -- Pharyngeal- Thin Straw -- Pharyngeal -- Pharyngeal- Puree WFL Pharyngeal -- Pharyngeal- Mechanical Soft -- Pharyngeal -- Pharyngeal- Regular -- Pharyngeal -- Pharyngeal- Multi-consistency -- Pharyngeal -- Pharyngeal- Pill -- Pharyngeal -- Pharyngeal Comment --  No flowsheet data found. Herbie [place], [place] CCC-SLP [phone] [place], Katherene Ponto 04/05/2018, 12:20 PM                Assessment/Plan: Diagnosis: Right hemiplegia, aphasia, severe cognitive deficits and dysphagia secondary to aneurysmal bleed in a patient with stage IV breast CA who has finished a second round of  palliative chemotherapy 1. Does the need for close, 24 hr/day medical supervision in concert with the patient's rehab needs make it unreasonable for this patient to be served in a less intensive setting? Potentially 2. Co-Morbidities requiring supervision/potential complications: Bowel incontinence and diarrhea requiring rectal tube, urinary retention, at risk for aspiration pneumonia 3. Due to bladder  management, bowel management, safety, skin/wound care, disease management, medication administration, pain management and patient education, does the patient require 24 hr/day rehab nursing? Potentially 4. Does the patient require coordinated care of a physician, rehab nurse, PT, OT, speech to address physical and functional deficits in the context of the above medical diagnosis(es)? Potentially Addressing deficits in the following areas: balance, endurance, locomotion, strength, transferring, bowel/bladder control, bathing, dressing, feeding, grooming, toileting, cognition, speech, language, swallowing and psychosocial support 5. Can the patient actively participate in an intensive therapy program of at least 3 hrs of therapy per day at least 5 days per week? No 6. The potential for patient to make measurable gains while on inpatient rehab is poor 7. Anticipated functional outcomes upon discharge from inpatient rehab are n/a  with PT, n/a with OT, n/a with SLP. 8. Estimated rehab length of stay to reach the above functional goals is: deferred 9. Anticipated D/C setting: home vs SNF 10. Anticipated post D/C treatments: see below 11. Overall Rehab/Functional Prognosis: poor  RECOMMENDATIONS: This patient's condition is appropriate for continued rehabilitative care in the following setting: acute care setting Patient has agreed to participate in recommended program. N/A Note that insurance prior authorization may be required for reimbursement for recommended care.  Comment: Discussion with patient's  husband, SLP and nursing in the room.  We discussed poor endurance and inability to tolerate an intensive rehabilitation program.  We also discussed very poor intake which is a combination between oral pharyngeal dysphagia as well as severe cognitive deficits from the aneurysmal bleed.  We discussed that adequate nutrition may require PEG and patient's husband will need to decide whether he wants to pursue this.  We discussed that palliative care may help establish this goal and other goals of care given complexity of her medical condition Stage IV, breast CA.  We discussed that only a partial recovery is expected.  "I have personally performed a face to face diagnostic evaluation of this patient.  Additionally, I have reviewed and concur with the physician assistant's documentation above." Charlett Blake M.D. [facility] FAAPM&R (Sports Med, Neuromuscular Med) Diplomate Am Board of Electrodiagnostic Med  Elizabeth Sauer 04/07/2018        Revision History

## 2018-04-16 NOTE — Plan of Care (Signed)
  Problem: Consults Goal: RH GENERAL PATIENT EDUCATION Description See Patient Education module for education specifics. Outcome: Not Progressing Goal: Skin Care Protocol Initiated - if Braden Score 18 or less Description If consults are not indicated, leave blank or document N/A Outcome: Not Progressing   Problem: RH BOWEL ELIMINATION Goal: RH STG MANAGE BOWEL WITH ASSISTANCE Description STG Manage Bowel with min  Assistance.  Outcome: Not Progressing Goal: RH STG MANAGE BOWEL W/MEDICATION W/ASSISTANCE Description STG Manage Bowel with Medication with min-mod Assistance.  Outcome: Not Progressing   Problem: RH BLADDER ELIMINATION Goal: RH STG MANAGE BLADDER WITH ASSISTANCE Description STG Manage Bladder With min-mod Assistance  Outcome: Not Progressing   Problem: RH SKIN INTEGRITY Goal: RH STG SKIN FREE OF INFECTION/BREAKDOWN Outcome: Not Progressing Goal: RH STG MAINTAIN SKIN INTEGRITY WITH ASSISTANCE Description STG Maintain Skin Integrity With min-mod Assistance.  Outcome: Not Progressing   Problem: RH SAFETY Goal: RH STG ADHERE TO SAFETY PRECAUTIONS W/ASSISTANCE/DEVICE Description STG Adhere to Safety Precautions With min-mod Assistance/Device.  Outcome: Not Progressing   Problem: RH PAIN MANAGEMENT Goal: RH STG PAIN MANAGED AT OR BELOW PT'S PAIN GOAL Outcome: Not Progressing   Problem: RH KNOWLEDGE DEFICIT GENERAL Goal: RH STG INCREASE KNOWLEDGE OF SELF CARE AFTER HOSPITALIZATION Outcome: Not Progressing   Problem: RH Vision Goal: RH LTG Vision (Specify) Outcome: Not Progressing   Problem: RH Pre-functional/Other (Specify) Goal: RH LTG Pre-functional (Specify) Outcome: Not Progressing Goal: RH LTG Interdisciplinary (Specify) 1 Description RH LTG Interdisciplinary (Specify)1 Outcome: Not Progressing Goal: RH LTG Interdisciplinary (Specify) 2 Description RH LTG Interdisciplinary (Specify) 2  Outcome: Not Progressing  New admit

## 2018-04-16 NOTE — Progress Notes (Signed)
Inpatient Rehabilitation-Admissions Coordinator   Spoke with pt and husband at the bedside as follow up from therapy request. PM&R consult was completed on 04/07/18; Since this consult, pt has had noted improvement in activity tolerance and engagement. Pt and family understand goals of care for CIR are to address transfers and reducing burden of care to allow pt to return home with 1 person assist. Pt and family wanting to pursue CIR at this time.   AC has received medical approval from attending and will pursue admitting pt to CIR today.   Please call if questions.   Jhonnie Garner, OTR/L  Rehab Admissions Coordinator  [phone] 04/16/2018 12:28 PM

## 2018-04-16 NOTE — PMR Pre-admission (Signed)
PMR Admission Coordinator Pre-Admission Assessment  Patient: Diane Short is an 62 y.o., female MRN: [id number] DOB: February 27, 1956 Height: _0  (162.6 cm) Weight: 102 kg              Insurance Information HMO:     PPO:      PCP:      IPA:      80/20:      OTHER:  PRIMARY: Medicaid [place]      Policy#: [id number]      Subscriber: Patient CM Name:       Phone#:      Fax#:  Pre-Cert#:       Employer:  Benefits:  Phone #: NA     Name: verified eligibility online via [employer] on 04/16/18 Coverage Code: MAFWN Eff. Date: checked on 04/16/18     Deduct:       Out of Pocket Max:       Life Max:  CIR: Covered per Medicaid Guidelines       SNF:  Outpatient:      Co-Pay:  Home Health:       Co-Pay:  DME:      Co-Pay:  Providers:  SECONDARY:       Policy#:       Subscriber:  CM Name:       Phone#:      Fax#:  Pre-Cert#:       Employer:  Benefits:  Phone #:      Name:  Eff. Date:      Deduct:       Out of Pocket Max:       Life Max:  CIR:       SNF:  Outpatient:      Co-Pay:  Home Health:       Co-Pay:  DME:      Co-Pay:  Medicaid Application Date:       Case Manager:  Disability Application Date:       Case Worker:   Emergency Contact Information Contact Information    Name Relation Home Work Duson Spouse [phone]  [phone]   Jasmine December Daughter   [phone]     Current Medical History  Patient Admitting Diagnosis: Right hemiplegia, aphasia, severe cognitive deficits and dysphagia secondary to aneurysmal bleed in a patient with stage IV breast CA who has finished a second round of palliative chemotherapy  History of Present Illness: Diane Short is a 62 year old right-handed female who is married reportedly independent up until few weeks ago with complicated medical history to include progressive stage IV progressive breast cancer followed by Dr. Hinton Rao with current completion of second cycle of chemotherapy for palliative management.  Patient  recently had radiation stereotactic surgery for single solitary brain met as well as history of 5 intracerebral aneurysms 3 of which treated endovascularly.  Noted progressive decline over the last few weeks with intermittent headaches.  She was seen by EMS 03/22/2018 taken to [facility] for unresponsive episode requiring intubation.  CT scan of the head showed subarachnoid hemorrhage and subsequently transferred to [facility] for further treatment.  EEG completed showing general background slowing and no seizure activity.  Echocardiogram with ejection fraction of 10% grade 1 diastolic dysfunction.  CT angiogram of head and neck showed increased volume of subarachnoid hemorrhage predominantly located in the basal cisterns and sylvian fissures with intraventricular extension in a pattern most consistent with aneurysmal hemorrhage.  Underwent cerebral angiogram coil embolization of left PICA  aneurysm per Dr. Kathyrn Sheriff.  She remained intubated until 04/03/2018.  Maintained on Dilantin/Keppra for seizure prophylaxis.Nimotop for blood pressure control which has been completed.  Subcutaneous.  Lovenox initiated for DVT prophylaxis 03/31/2018.  She is on a dysphagia #2 nectar thick liquid diet.  Hospital course palliative care follow-up therapy evaluations completed with recommendations of physical medicine rehab consult.  Patient is to be admitted for a comprehensive rehab program on 04/16/18.   Complete NIHSS TOTAL: 19    Past Medical History  Past Medical History:  Diagnosis Date  . 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 Anxiety   . Arthritis   . Cancer [facility])    breast cancer  . Cerebral hemorrhage ([place]) 1986  . Depression    due to cancer dx  . Diverticulosis   . GERD (gastroesophageal reflux disease)   . Headache   . History of hiatal hernia   . Hypertension   . 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 Neutropenic fever ([place])    Chemo related  . Osteonecrosis ([place]) 2017-2018   Right Jaw bone  . Thrush,  oral    Chemo related    Family History  family history includes Alzheimer's disease in her mother; Cerebral aneurysm in her father; Colon cancer in her father; Diabetes in her sister; Esophageal cancer in her brother; Hypertension in her sister; Multiple myeloma in her brother; Stomach cancer in her brother.  Prior Rehab/Hospitalizations:  Has the patient had major surgery during 100 days prior to admission? No  Current Medications   Current Facility-Administered Medications:  .  0.9 %  sodium chloride infusion, , Intravenous, PRN, Consuella Lose, MD, Stopped at 04/10/18 1537 .  [DISCONTINUED] acetaminophen (TYLENOL) tablet 650 mg, 650 mg, Oral, Q4H PRN, 650 mg at 04/08/18 0423 **OR** acetaminophen (TYLENOL) solution 650 mg, 650 mg, Per Tube, Q4H PRN, 650 mg at 04/16/18 1139 **OR** [DISCONTINUED] acetaminophen (TYLENOL) suppository 650 mg, 650 mg, Rectal, Q4H PRN, Chesley Mires, MD, 650 mg at 04/04/18 0012 .  amLODipine (NORVASC) tablet 10 mg, 10 mg, Per Tube, Daily, Cherene Altes, MD, 10 mg at 04/16/18 1139 .  carvedilol (COREG) tablet 25 mg, 25 mg, Per Tube, BID WC, Cherene Altes, MD, 25 mg at 04/16/18 1140 .  Chlorhexidine Gluconate Cloth 2 % PADS 6 each, 6 each, Topical, Daily, Consuella Lose, MD, 6 each at 04/14/18 1000 .  cholecalciferol (D-VI-SOL) 400 UNIT/ML oral liquid 800 Units, 800 Units, Per Tube, Daily, Cherene Altes, MD, 800 Units at 04/16/18 1149 .  enoxaparin (LOVENOX) injection 30 mg, 30 mg, Subcutaneous, Q24H, Consuella Lose, MD, 30 mg at 04/16/18 1139 .  famotidine (PEPCID) tablet 20 mg, 20 mg, Per Tube, Daily, Cherene Altes, MD, 20 mg at 04/16/18 1140 .  feeding supplement (ENSURE ENLIVE) (ENSURE ENLIVE) liquid 237 mL, 237 mL, Oral, BID BM, Pokhrel, Laxman, MD, 237 mL at 04/15/18 1434 .  Gerhardt's butt cream, , Topical, QID, Cherene Altes, MD, 1 application at 07/37/10 2256 .  hydrALAZINE (APRESOLINE) injection 10 mg, 10 mg,  Intravenous, Q4H PRN, Patrecia Pour, Christean Grief, MD, 10 mg at 04/11/18 0618 .  labetalol (NORMODYNE) tablet 100 mg, 100 mg, Per Tube, TID, Cherene Altes, MD, 100 mg at 04/16/18 1140 .  levETIRAcetam (KEPPRA) 100 MG/ML solution 750 mg, 750 mg, Per Tube, BID, Greta Doom, MD, 750 mg at 04/16/18 1139 .  loperamide HCl (IMODIUM) 1 MG/7.5ML suspension 2 mg, 2 mg, Per Tube, Q6H, Cherene Altes,  MD, 2 mg at 04/16/18 1150 .  MEDLINE mouth rinse, 15 mL, Mouth Rinse, BID, Pokhrel, Laxman, MD, 15 mL at 04/16/18 1152 .  metoprolol tartrate (LOPRESSOR) injection 2.5-5 mg, 2.5-5 mg, Intravenous, Q3H PRN, Patrecia Pour, Christean Grief, MD, 5 mg at 04/09/18 0853 .  ondansetron (ZOFRAN-ODT) disintegrating tablet 4 mg, 4 mg, Oral, Q6H PRN **OR** ondansetron (ZOFRAN) injection 4 mg, 4 mg, Intravenous, Q6H PRN, Halford Chessman, Vineet, MD .  phenytoin (DILANTIN) 125 MG/5ML suspension 100 mg, 100 mg, Per Tube, TID, Cherene Altes, MD, 100 mg at 04/16/18 1149 .  QUEtiapine (SEROQUEL) tablet 25 mg, 25 mg, Oral, QHS, Cherene Altes, MD, 25 mg at 04/15/18 2255 .  RESOURCE THICKENUP CLEAR, , Oral, PRN, Pokhrel, Laxman, MD .  sodium chloride flush (NS) 0.9 % injection 10-40 mL, 10-40 mL, Intracatheter, Q12H, Consuella Lose, MD, 10 mL at 04/16/18 1152 .  sodium chloride flush (NS) 0.9 % injection 10-40 mL, 10-40 mL, Intracatheter, PRN, Cherene Altes, MD, 20 mL at 04/10/18 0323  Patients Current Diet:  Diet Order            Diet - low sodium heart healthy        DIET DYS 2 Room service appropriate? Yes; Fluid consistency: Nectar Thick  Diet effective now              Precautions / Restrictions Precautions Precautions: Fall Restrictions Weight Bearing Restrictions: No   Has the patient had 2 or more falls or a fall with injury in the past year?No  Prior Activity Level Community (5-7x/wk): active PTA, retired but Independent in tasks and mobility; went out to meet friends  Development worker, international aid /  [place] Devices/Equipment: Grab bars in shower, Hand-held shower hose Home Equipment: Grab bars - tub/shower  Prior Device Use: Indicate devices/aids used by the patient prior to current illness, exacerbation or injury? None of the above  Prior Functional Level Prior Function Level of Independence: Independent Comments: drove; husband occasionally helpes with drying off legs due to unsteadiness  Self Care: Did the patient need help bathing, dressing, using the toilet or eating?  Independent  Indoor Mobility: Did the patient need assistance with walking from room to room (with or without device)? Independent  Stairs: Did the patient need assistance with internal or external stairs (with or without device)? Independent  Functional Cognition: Did the patient need help planning regular tasks such as shopping or remembering to take medications? Independent  Current Functional Level Cognition  Arousal/Alertness: Awake/alert Overall Cognitive Status: Impaired/Different from baseline Current Attention Level: Selective Orientation Level: Disoriented to situation, Oriented to person, Oriented to place, Oriented to time Following Commands: Follows one step commands consistently, Follows one step commands with increased time Safety/Judgement: Decreased awareness of safety, Decreased awareness of deficits General Comments: oriented to place, time, situation Attention: Focused, Sustained Focused Attention: Appears intact Sustained Attention: Impaired Sustained Attention Impairment: Verbal basic, Functional basic Memory: Impaired Memory Impairment: Storage deficit Awareness: Impaired Awareness Impairment: Emergent impairment Problem Solving: Impaired Problem Solving Impairment: Verbal basic, Functional basic Executive Function: Self Monitoring, Initiating Initiating: Impaired Initiating Impairment: Verbal basic, Functional basic Self Monitoring: Impaired Self Monitoring  Impairment: Verbal basic, Functional basic    Extremity Assessment (includes Sensation/Coordination)  Upper Extremity Assessment: Generalized weakness, LUE deficits/detail RUE Deficits / Details: Pt able to attempt to comb front section of hair with support at Rt elbow  RUE Sensation: decreased light touch RUE Coordination: decreased fine motor, decreased gross motor LUE Deficits / Details: Lt  UE tremulous, and pt tends to use Rt UE for all tasks unless cued.  By end of session, pt was noted to gesture spontaneously with both Lt and Rt hands while telling jokes  LUE Sensation: decreased light touch LUE Coordination: decreased gross motor, decreased fine motor  Lower Extremity Assessment: Defer to PT evaluation RLE Deficits / Details: grossly 3-/5, quads locking out in standing. RLE Coordination: decreased fine motor LLE Deficits / Details: grossly 3-/5, quads 2+ LLE Coordination: decreased fine motor    ADLs  Overall ADL's : Needs assistance/impaired Eating/Feeding: Maximal assistance Grooming: Oral care, Wash/dry face, Maximal assistance, Sitting Grooming Details (indicate cue type and reason): Pt able to comb the front portion of her hair, but requires assist to complete task  Upper Body Bathing: Maximal assistance, Bed level Lower Body Bathing: Maximal assistance, Sit to/from stand Upper Body Dressing : Maximal assistance, Sitting Lower Body Dressing: Total assistance, Bed level Toilet Transfer: Moderate assistance, +2 for physical assistance, +2 for safety/equipment, BSC Toilet Transfer Details (indicate cue type and reason): using stedy  Toileting- Clothing Manipulation and Hygiene: Maximal assistance, Sit to/from stand Toileting - Clothing Manipulation Details (indicate cue type and reason): incontinent of BM; total A to clean; apparent moisture assisted skin damage Functional mobility during ADLs: Moderate assistance, +2 for physical assistance, +2 for safety/equipment General  ADL Comments: total A all ADL    Mobility  Overal bed mobility: Needs Assistance Bed Mobility: Rolling, Sidelying to Sit Rolling: Mod assist Sidelying to sit: Mod assist Supine to sit: Mod assist, HOB elevated Sit to supine: Min assist Sit to sidelying: Total assist, +2 for physical assistance General bed mobility comments: able to move her feet to EOB, assist to scoot, used rail    Transfers  Overall transfer level: Needs assistance Equipment used: Ambulation equipment used Transfer via Lift Equipment: Stedy Transfers: Sit to/from Stand Sit to Stand: Mod assist, +2 physical assistance Squat pivot transfers: Total assist, +2 physical assistance General transfer comment: from elevated surface stood up pulling up on stedy with increased time to reach railing with dysmetria, lifting assist, cues and assist for anterior weight shift; stood from bed to [facility] then from [facility] with RW stood for hygiene, but LE's fatigued and unable to take steps so sat on [facility] and got to recliner with stedy.     Ambulation / Gait / Stairs / Wheelchair Mobility  Ambulation/Gait Ambulation/Gait assistance: (NT) General Gait Details: not able    Posture / Balance Dynamic Sitting Balance Sitting balance - Comments: cues and minguard for safety due to lateral lean Balance Overall balance assessment: Needs assistance Sitting-balance support: Feet supported Sitting balance-Leahy Scale: Poor Sitting balance - Comments: cues and minguard for safety due to lateral lean Postural control: Right lateral lean Standing balance support: Bilateral upper extremity supported Standing balance-Leahy Scale: Poor Standing balance comment: posterior lean in standing fatigues quickly, stood approx 40 seconds for hygiene and donning briefs; sit to stand x 3; tremors noted in standing    Special needs/care consideration BiPAP/CPAP: no CPM: no Continuous Drip IV: no Dialysis: no        Days: NA Life Vest: no Oxygen: no Special  Bed: no Trach Size: no Wound Vac (area): no      Location: no Skin: abrasion to knees, ecchymosis bilateral arms and legs, moisture associated damage to groin and buttocks, petechiae on bilateral arms, skin teat to right buttocks; from 04/09/18: documented pressure injury stage III to anus; right groin incision area from 03/23/18.  Bowel mgmt: last BM: 04/16/18 per pt; 04/13/18 in chart Bladder mgmt:pur[place], continent Diabetic mgmt: no per pt report; glucose measured in chart     Previous Home Environment Living Arrangements: Spouse/significant other  Lives With: Spouse Available Help at Discharge: Family, Available 24 hours/day Type of Home: [address]: One level, Other (Comment)(1 step to den/kitchen) Home Access: Level entry Bathroom Shower/Tub: Tub/shower unit, Architectural technologist: Standard Bathroom Accessibility: Yes How Accessible: Accessible via wheelchair [place]: No  Discharge Living Setting Plans for Discharge Living Setting: Patient's home, Lives with (comment)(lives with husband) Type of Home at Discharge: House Discharge Home Layout: One level Discharge Home Access: Level entry(1 small ledge to enter) Discharge Bathroom Shower/Tub: Tub/shower unit Discharge Bathroom Toilet: Standard Discharge Bathroom Accessibility: Yes How Accessible: Accessible via wheelchair, Accessible via walker Does the patient have any problems obtaining your medications?: No  Social/Family/Support Systems Patient Roles: Spouse Contact Information: emergency contact is spouse: [name]  Anticipated Caregiver: [name] (husband) and daughter lives in town and can assist (but works 3pm-11pm at an [place]) [place]: husband: [phone] (home: [phone]); daughter [place]: [phone]) Ability/Limitations of Caregiver: Mod A per husband Caregiver Availability: 24/7 Discharge Plan Discussed with Primary Caregiver:  Yes(with pt and husband) Is Caregiver In Agreement with Plan?: Yes Does Caregiver/Family have Issues with Lodging/Transportation while Pt is in Rehab?: No   Goals/Additional Needs Patient/Family Goal for Rehab: PT/OT/SLP: Mod A Expected length of stay: 5-7 days Cultural Considerations: Christian Dietary Needs: DYS 2, nectar thick Equipment Needs: TBD based on function Pt/Family Agrees to Admission and willing to participate: Yes Program Orientation Provided & Reviewed with Pt/Caregiver Including Roles  & Responsibilities: Yes(with pt and husband )  Barriers to Discharge: Lack of/limited family support, Pending chemo/radiation(pt will need to be +1 Mod A for transfers)   Decrease burden of Care through IP rehab admission: Specialzed equipment needs, Decrease number of caregivers and Patient/family education Family and pt understand CIR stay will be to reduce burden of care to allow pt to return home for a 1 person transfer assist and enable pt to be care by husband at home for ADLs.    Possible need for SNF placement upon discharge: Not anticipated; Pt's husband is able to provide 24/7 care and plans to take pt home if able to transfer safely.    Patient Condition: This patient's medical and functional status has changed since the consult dated 04/07/18 in which the Rehabilitation Physician documented that the patient was not appropriate for intensive rehabilitative care in an inpatient rehabilitation facility. Due to change in status and issues being addressed, patient's case has been discussed with Dr. Naaman Plummer and patient now appropriate for inpatient rehabilitation. Pt has improved nutrition intake per family, has demonstrated improvement in alertness, participation, initiation, and endurance per therapy, and family contnue to want to pursue rehab to enable pt to return home with her husband for care. Will admit to inpatient rehab today on 04/16/18.   Preadmission Screen Completed By:  Jhonnie Garner, 04/16/2018 1:09 PM ______________________________________________________________________   Discussed status with Dr. Naaman Plummer on 04/16/18 at 1:08PM and received telephone approval for admission today.  Admission Coordinator:  Jhonnie Garner, time 1:08PM Sudie Grumbling 04/16/18

## 2018-04-17 ENCOUNTER — Inpatient Hospital Stay (HOSPITAL_COMMUNITY): Payer: Medicaid Other | Admitting: Speech Pathology

## 2018-04-17 ENCOUNTER — Inpatient Hospital Stay (HOSPITAL_COMMUNITY): Payer: Medicaid Other

## 2018-04-17 ENCOUNTER — Inpatient Hospital Stay (HOSPITAL_COMMUNITY): Payer: Medicaid Other | Admitting: Physical Therapy

## 2018-04-17 DIAGNOSIS — R1312 Dysphagia, oropharyngeal phase: Secondary | ICD-10-CM

## 2018-04-17 DIAGNOSIS — Z2989 Encounter for other specified prophylactic measures: Secondary | ICD-10-CM

## 2018-04-17 DIAGNOSIS — D638 Anemia in other chronic diseases classified elsewhere: Secondary | ICD-10-CM

## 2018-04-17 DIAGNOSIS — Z298 Encounter for other specified prophylactic measures: Secondary | ICD-10-CM

## 2018-04-17 DIAGNOSIS — C50919 Malignant neoplasm of unspecified site of unspecified female breast: Secondary | ICD-10-CM

## 2018-04-17 DIAGNOSIS — I609 Nontraumatic subarachnoid hemorrhage, unspecified: Secondary | ICD-10-CM

## 2018-04-17 DIAGNOSIS — I10 Essential (primary) hypertension: Secondary | ICD-10-CM

## 2018-04-17 DIAGNOSIS — G8191 Hemiplegia, unspecified affecting right dominant side: Secondary | ICD-10-CM

## 2018-04-17 LAB — GLUCOSE, CAPILLARY: GLUCOSE-CAPILLARY: 110 mg/dL — AB (ref 70–99)

## 2018-04-17 MED ORDER — SODIUM CHLORIDE 0.9% FLUSH: 10.0000 mL | INTRAVENOUS | Status: DC | PRN

## 2018-04-17 NOTE — Evaluation (Signed)
Speech Language Pathology Assessment and Plan  Patient Details  Name: Diane Short MRN: [id number] Date of Birth: [birth year]  SLP Diagnosis: Dysphagia;Cognitive Impairments;Dysarthria  Rehab Potential: Good ELOS: 14-17 days     Today's Date: 04/17/2018 SLP Individual Time: 3614-4315 SLP Individual Time Calculation (min): 60 min   Problem List:  Patient Active Problem List   Diagnosis Date Noted  . Dysphagia, oropharyngeal   . Pressure injury of skin 04/11/2018  . Goals of care, counseling/discussion   . Palliative care by specialist   . Adult failure to thrive   . Endotracheal tube present   . SAH (subarachnoid hemorrhage) () 03/22/2018  . Malignant neoplasm of left breast in female, estrogen receptor negative ([place]) 01/07/2018  . Brain metastasis ([place]) 01/07/2018  . Cerebral aneurysm, nonruptured 03/13/2017   Past Medical History:  Past Medical History:  Diagnosis Date  . 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 Anxiety   . Arthritis   . Cancer [facility])    breast cancer  . Cerebral hemorrhage ([place]) 1986  . Depression    due to cancer dx  . Diverticulosis   . GERD (gastroesophageal reflux disease)   . Headache   . History of hiatal hernia   . Hypertension   . 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 Neutropenic fever ([place])    Chemo related  . Osteonecrosis ([place]) 2017-2018   Right Jaw bone  . Thrush, oral    Chemo related   Past Surgical History:  Past Surgical History:  Procedure Laterality Date  . BREAST SURGERY Left 11/2016   metastic to bone  . CATARACT EXTRACTION Right 03/02/2017  . CEREBRAL ANGIOGRAM  1986  . CESAREAN SECTION  1980  . IR 3D INDEPENDENT WKST  01/20/2017  . IR 3D INDEPENDENT WKST  05/26/2017  . IR ANGIO INTRA EXTRACRAN SEL COM CAROTID INNOMINATE UNI R MOD SED  04/09/2017  . IR ANGIO INTRA EXTRACRAN SEL INTERNAL CAROTID BILAT MOD SED  01/20/2017  . IR ANGIO INTRA EXTRACRAN SEL INTERNAL CAROTID BILAT MOD SED  11/17/2017  . IR ANGIO INTRA EXTRACRAN SEL  INTERNAL CAROTID BILAT MOD SED  03/23/2018  . IR ANGIO INTRA EXTRACRAN SEL INTERNAL CAROTID UNI R MOD SED  03/13/2017  . IR ANGIO VERTEBRAL SEL VERTEBRAL BILAT MOD SED  01/20/2017  . IR ANGIO VERTEBRAL SEL VERTEBRAL UNI L MOD SED  05/26/2017  . IR ANGIO VERTEBRAL SEL VERTEBRAL UNI L MOD SED  11/17/2017  . IR ANGIOGRAM FOLLOW UP STUDY  03/13/2017  . IR ANGIOGRAM FOLLOW UP STUDY  03/13/2017  . IR ANGIOGRAM FOLLOW UP STUDY  03/13/2017  . IR ANGIOGRAM FOLLOW UP STUDY  03/13/2017  . IR ANGIOGRAM FOLLOW UP STUDY  03/13/2017  . IR ANGIOGRAM FOLLOW UP STUDY  03/13/2017  . IR ANGIOGRAM FOLLOW UP STUDY  03/13/2017  . IR ANGIOGRAM FOLLOW UP STUDY  03/13/2017  . IR ANGIOGRAM FOLLOW UP STUDY  03/13/2017  . IR ANGIOGRAM FOLLOW UP STUDY  03/13/2017  . IR ANGIOGRAM FOLLOW UP STUDY  03/13/2017  . IR ANGIOGRAM FOLLOW UP STUDY  03/13/2017  . IR ANGIOGRAM FOLLOW UP STUDY  04/09/2017  . IR ANGIOGRAM FOLLOW UP STUDY  05/26/2017  . IR ANGIOGRAM FOLLOW UP STUDY  05/26/2017  . IR ANGIOGRAM FOLLOW UP STUDY  03/23/2018  . IR NEURO EACH ADD'L AFTER BASIC UNI LEFT (MS)  04/09/2017  . IR NEURO EACH ADD'L AFTER BASIC UNI LEFT (MS)  03/23/2018  . IR NEURO EACH ADD'L  AFTER BASIC UNI RIGHT (MS)  03/13/2017  . IR TRANSCATH/EMBOLIZ  03/13/2017  . IR TRANSCATH/EMBOLIZ  04/09/2017  . IR TRANSCATH/EMBOLIZ  05/26/2017  . IR TRANSCATH/EMBOLIZ  03/23/2018  . IR US GUIDE VASC ACCESS RIGHT  04/09/2017  . MASTECTOMY Left   . port a cathh  04/2016  . RADIOLOGY WITH ANESTHESIA N/A 03/13/2017   Procedure: stent coiling of right MCA aneurysm;  Surgeon: Consuella Lose, MD;  Location: [place];  Service: Radiology;  Laterality: N/A;  . RADIOLOGY WITH ANESTHESIA N/A 04/09/2017   Procedure: Arteriogram, pipeline embolization of aneruysm;  Surgeon: Consuella Lose, MD;  Location: [place];  Service: Radiology;  Laterality: N/A;  . RADIOLOGY WITH ANESTHESIA N/A 05/26/2017   Procedure: Stent supported coil embolization;  Surgeon: Consuella Lose, MD;   Location: [place];  Service: Radiology;  Laterality: N/A;  . RADIOLOGY WITH ANESTHESIA N/A 03/23/2018   Procedure: IR WITH ANESTHESIA;  Surgeon: Consuella Lose, MD;  Location: [place];  Service: Radiology;  Laterality: N/A;  . TUBAL LIGATION  1983    Assessment / Plan / Recommendation Clinical Impression Patient is a 62 year old right-handed female who is married reportedly independent up until few weeks ago with complicated medical history to include progressive stage IV progressive breast cancer followed by Dr. Hinton Rao with current completion of second cycle of chemotherapy for palliative management. Patient recently had radiation stereotactic surgery for single solitary brain met as well as history of 5 intracerebral aneurysms 3 of which treated endovascularly. Noted progressive decline over the last few weeks with intermittent headaches. She was seen by EMS 03/22/2018 taken to [facility] for unresponsive episode requiring intubation. CT scan of the head showed subarachnoid hemorrhage and subsequently transferred to [facility] for further treatment. EEG completed showing general background slowing and no seizure activity. Echocardiogram with ejection fraction of 40% grade 1 diastolic dysfunction. CT angiogram of head and neck showed increased volume of subarachnoid hemorrhage predominantly located in the basal cisterns and sylvian fissures with intraventricular extension in a pattern most consistent with aneurysmal hemorrhage. 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 Kathyrn Sheriff. She remained intubated until 04/03/2018. Maintained on Dilantin/Keppra for seizure prophylaxis.Nimotop for blood pressure control which has been completed. Subcutaneous. Lovenox initiated for DVT prophylaxis 03/31/2018. She is on a dysphagia #2 nectar thick liquid diet. Hospital course palliative care follow-up therapy evaluations completed with recommendations of physical medicine  rehab consult. Patient was admitted for a comprehensive rehab program 04/16/18.  Patient demonstrates moderate-severe cognitive impairments that are further exacerbated by fatigue. Patient demonstrates impaired sustained attention, functional problem solving, recall and awareness with delayed processing resulting in decreased safety with functional tasks. Of note, per report from both the patient and her husband, the patient has been consuming thin liquids via straw and taking medications whole with thin while on acute care but was unable to verbalize who agreed to upgrade. Patient consumed ~8 oz of thin liquids via straw with use of multiple swallows but without overt s/s of aspiration. Due to patient's improved vocal intensity and appeared airway protection at the bedside  (Per last MBS on 10/2, all aspiration events were sensed with thin liquids), patient upgraded to thin lqiuids. Patient also consumed trials of Dys. 3 textures, but mastication was slow with prolonged oral transit (suspect due to fatigue) with liquid washes needed, therefore, recommend continued trials with SLP prior to upgrade. Patient demonstrates mildly decreased vocal intensity but was 100% intelligible at the sentence level. Patient would benefit from skilled  SLP intervention to maximize her swallowing and cognitive functioning prior to discharge.    Skilled Therapeutic Interventions          Patient administered a cognitive-linguistic evaluation and BSE, please see above for details. Educated patient and husband in regards to current cognitive and swallowing deficits and goals of skilled SLP intervention, both verbalized understanding and agreement.    SLP Assessment  Patient will need skilled Speech Lanaguage Pathology Services during CIR admission    Recommendations  SLP Diet Recommendations: Dysphagia 2 (Fine chop);Thin Liquid Administration via: Cup Medication Administration: Whole meds with liquid(or puree ) Supervision:  Patient able to self feed;Full supervision/cueing for compensatory strategies(husband ok ) Compensations: Slow rate;Small sips/bites;Minimize environmental distractions Postural Changes and/or Swallow Maneuvers: Seated upright 90 degrees Oral Care Recommendations: Oral care QID Recommendations for Other Services: Neuropsych consult Patient destination: Home Follow up Recommendations: Home Health SLP;24 hour supervision/assistance;Outpatient SLP Equipment Recommended: None recommended by SLP    SLP Frequency 3 to 5 out of 7 days   SLP Duration  SLP Intensity  SLP Treatment/Interventions 14-17 days   Minumum of 1-2 x/day, 30 to 90 minutes  Cognitive remediation/compensation;Cueing hierarchy;Dysphagia/aspiration precaution training;Functional tasks;Patient/family education;Therapeutic Activities;Speech/Language facilitation;Internal/external aids;Environmental controls    Pain Pain Assessment Pain Scale: 0-10 Pain Score: 0-No pain  Short Term Goals: Week 1: SLP Short Term Goal 1 (Week 1): Patient will consume current diet with minimal overt s/s of aspiration and Mod I for use of swallowing strategies.  SLP Short Term Goal 2 (Week 1): Patient will demonstrate efficient mastication and complete oral clearance with trials of Dys. 3 textures with supervision verbal cues prior to upgrade.  SLP Short Term Goal 3 (Week 1): Patient will demonstrate sustained attention to tasks for ~15 minutes with Min A verbal cues for redirection.  SLP Short Term Goal 4 (Week 1): Patient will demonstrate functional problem solving for basic and familiar tasks with Min A verbal cues.  SLP Short Term Goal 5 (Week 1): Patient will self-monitor and correct errors during functional tasks with Min A verbal cues.  SLP Short Term Goal 6 (Week 1): Patient will utilize external aids to recall new, daily information with Mod A verbal cues.   Refer to Care Plan for Long Term Goals  Recommendations for other services:  Neuropsych  Discharge Criteria: Patient will be discharged from SLP if patient refuses treatment 3 consecutive times without medical reason, if treatment goals not met, if there is a change in medical status, if patient makes no progress towards goals or if patient is discharged from hospital.  The above assessment, treatment plan, treatment alternatives and goals were discussed and mutually agreed upon: by patient and by family  Dorsey Charette 04/17/2018, 12:17 PM

## 2018-04-17 NOTE — Evaluation (Addendum)
Physical Therapy Assessment and Plan  Patient Details  Name: Diane Short MRN: [id number] Date of Birth: June 07, 1956  PT Diagnosis: Abnormality of gait, Cognitive deficits, Coordination disorder, Hemiplegia non-dominant and Muscle weakness Rehab Potential: Good ELOS: 21-25 days    Today's Date: 04/17/2018 PT Individual Time: 1300-1400 PT Individual Time Calculation (min): 60 min    Problem List:  Patient Active Problem List   Diagnosis Date Noted  . Right hemiparesis ([place])   . Stage IV breast cancer in female [facility])   . Essential hypertension   . Seizure prophylaxis   . Anemia of chronic disease   . Dysphagia, oropharyngeal   . Pressure injury of skin 04/11/2018  . Goals of care, counseling/discussion   . Palliative care by specialist   . Adult failure to thrive   . Endotracheal tube present   . SAH (subarachnoid hemorrhage) ([place]) 03/22/2018  . Malignant neoplasm of left breast in female, estrogen receptor negative ([place]) 01/07/2018  . Brain metastasis ([place]) 01/07/2018  . Cerebral aneurysm, nonruptured 03/13/2017    Past Medical History:  Past Medical History:  Diagnosis Date  . 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 Anxiety   . Arthritis   . Cancer [facility])    breast cancer  . Cerebral hemorrhage ([place]) 1986  . Depression    due to cancer dx  . Diverticulosis   . GERD (gastroesophageal reflux disease)   . Headache   . History of hiatal hernia   . Hypertension   . 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 Neutropenic fever ([place])    Chemo related  . Osteonecrosis ([place]) 2017-2018   Right Jaw bone  . Thrush, oral    Chemo related   Past Surgical History:  Past Surgical History:  Procedure Laterality Date  . BREAST SURGERY Left 11/2016   metastic to bone  . CATARACT EXTRACTION Right 03/02/2017  . CEREBRAL ANGIOGRAM  1986  . CESAREAN SECTION  1980  . IR 3D INDEPENDENT WKST  01/20/2017  . IR 3D INDEPENDENT WKST  05/26/2017  . IR ANGIO INTRA EXTRACRAN SEL COM CAROTID INNOMINATE UNI  R MOD SED  04/09/2017  . IR ANGIO INTRA EXTRACRAN SEL INTERNAL CAROTID BILAT MOD SED  01/20/2017  . IR ANGIO INTRA EXTRACRAN SEL INTERNAL CAROTID BILAT MOD SED  11/17/2017  . IR ANGIO INTRA EXTRACRAN SEL INTERNAL CAROTID BILAT MOD SED  03/23/2018  . IR ANGIO INTRA EXTRACRAN SEL INTERNAL CAROTID UNI R MOD SED  03/13/2017  . IR ANGIO VERTEBRAL SEL VERTEBRAL BILAT MOD SED  01/20/2017  . IR ANGIO VERTEBRAL SEL VERTEBRAL UNI L MOD SED  05/26/2017  . IR ANGIO VERTEBRAL SEL VERTEBRAL UNI L MOD SED  11/17/2017  . IR ANGIOGRAM FOLLOW UP STUDY  03/13/2017  . IR ANGIOGRAM FOLLOW UP STUDY  03/13/2017  . IR ANGIOGRAM FOLLOW UP STUDY  03/13/2017  . IR ANGIOGRAM FOLLOW UP STUDY  03/13/2017  . IR ANGIOGRAM FOLLOW UP STUDY  03/13/2017  . IR ANGIOGRAM FOLLOW UP STUDY  03/13/2017  . IR ANGIOGRAM FOLLOW UP STUDY  03/13/2017  . IR ANGIOGRAM FOLLOW UP STUDY  03/13/2017  . IR ANGIOGRAM FOLLOW UP STUDY  03/13/2017  . IR ANGIOGRAM FOLLOW UP STUDY  03/13/2017  . IR ANGIOGRAM FOLLOW UP STUDY  03/13/2017  . IR ANGIOGRAM FOLLOW UP STUDY  03/13/2017  . IR ANGIOGRAM FOLLOW UP STUDY  04/09/2017  . IR ANGIOGRAM FOLLOW UP STUDY  05/26/2017  . IR ANGIOGRAM FOLLOW UP STUDY  05/26/2017  . IR ANGIOGRAM FOLLOW UP STUDY  03/23/2018  . IR NEURO EACH ADD'L AFTER BASIC UNI LEFT (MS)  04/09/2017  . IR NEURO EACH ADD'L AFTER BASIC UNI LEFT (MS)  03/23/2018  . IR NEURO EACH ADD'L AFTER BASIC UNI RIGHT (MS)  03/13/2017  . IR TRANSCATH/EMBOLIZ  03/13/2017  . IR TRANSCATH/EMBOLIZ  04/09/2017  . IR TRANSCATH/EMBOLIZ  05/26/2017  . IR TRANSCATH/EMBOLIZ  03/23/2018  . IR US GUIDE VASC ACCESS RIGHT  04/09/2017  . MASTECTOMY Left   . port a cathh  04/2016  . RADIOLOGY WITH ANESTHESIA N/A 03/13/2017   Procedure: stent coiling of right MCA aneurysm;  Surgeon: Consuella Lose, MD;  Location: [place];  Service: Radiology;  Laterality: N/A;  . RADIOLOGY WITH ANESTHESIA N/A 04/09/2017   Procedure: Arteriogram, pipeline embolization of aneruysm;  Surgeon: Consuella Lose, MD;  Location: [place];  Service: Radiology;  Laterality: N/A;  . RADIOLOGY WITH ANESTHESIA N/A 05/26/2017   Procedure: Stent supported coil embolization;  Surgeon: Consuella Lose, MD;  Location: [place];  Service: Radiology;  Laterality: N/A;  . RADIOLOGY WITH ANESTHESIA N/A 03/23/2018   Procedure: IR WITH ANESTHESIA;  Surgeon: Consuella Lose, MD;  Location: [place];  Service: Radiology;  Laterality: N/A;  . TUBAL LIGATION  1983    Assessment & Plan Clinical Impression: Patient is a 62 year old right-handed female who is married reportedly independent up until few weeks ago with complicated medical history to include progressive stage IV progressive breast cancer followed by Dr. Hinton Rao with current completion of second cycle of chemotherapy for palliative management. Patient recently had radiation stereotactic surgery for single solitary brain met as well as history of 5 intracerebral aneurysms 3 of which treated endovascularly. Noted progressive decline over the last few weeks with intermittent headaches. She was seen by EMS 03/22/2018 taken to [facility] for unresponsive episode requiring intubation. CT scan of the head showed subarachnoid hemorrhage and subsequently transferred to [facility] for further treatment. EEG completed showing general background slowing and no seizure activity. Echocardiogram with ejection fraction of 70% grade 1 diastolic dysfunction. CT angiogram of head and neck showed increased volume of subarachnoid hemorrhage predominantly located in the basal cisterns and sylvian fissures with intraventricular extension in a pattern most consistent with aneurysmal hemorrhage. 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 Kathyrn Sheriff. She remained intubated until 04/03/2018. Maintained on Dilantin/Keppra for seizure prophylaxis.Nimotop for blood pressure control which has been completed. Subcutaneous. Lovenox initiated for DVT  prophylaxis 03/31/2018. She is on a dysphagia #2 nectar thick liquid diet. Hospital course palliative care follow-up therapy evaluations completed with recommendations of physical medicine rehab consult  Patient transferred to CIR on 04/16/2018 .   Patient currently requires max with mobility secondary to muscle weakness and muscle joint tightness, decreased coordination and decreased motor planning and decreased sitting balance, decreased standing balance, decreased postural control, hemiplegia and decreased balance strategies.  Prior to hospitalization, patient was independent  with mobility and lived with Spouse in a House home.  Home access is  Level entry.  Patient will benefit from skilled PT intervention to maximize safe functional mobility, minimize fall risk and decrease caregiver burden for planned discharge home with intermittent assist.  Anticipate patient will benefit from follow up [facility] at discharge.  PT - End of Session Activity Tolerance: Tolerates < 10 min activity, no significant change in vital signs Endurance Deficit: Yes PT Assessment Rehab Potential (ACUTE/IP ONLY): Good PT Barriers to Discharge: Medical stability;Wound Care;Pending chemo/radiation  PT Patient demonstrates impairments in the following area(s): Balance;Behavior;Edema;Endurance;Motor;Pain;Nutrition;Perception;Safety;Sensory;Skin Integrity PT Transfers Functional Problem(s): Bed Mobility;Bed to Chair;Car;Furniture PT Locomotion Functional Problem(s): Ambulation;Wheelchair Mobility;Stairs PT Plan PT Intensity: Minimum of 1-2 x/day ,45 to 90 minutes PT Frequency: 5 out of 7 days PT Duration Estimated Length of Stay: 21-25 days  PT Treatment/Interventions: Ambulation/gait training;Balance/vestibular training;Cognitive remediation/compensation;Community reintegration;Discharge planning;Disease management/prevention;DME/adaptive equipment instruction;Functional electrical stimulation;Functional mobility  training;Neuromuscular re-education;Pain management;Patient/family education;Psychosocial support;Skin care/wound management;Splinting/orthotics;Stair training;Therapeutic Activities;Therapeutic Exercise;UE/LE Strength taining/ROM;UE/LE Coordination activities;Wheelchair propulsion/positioning;Visual/perceptual remediation/compensation PT Transfers Anticipated Outcome(s): Supervision assist transfers  PT Locomotion Anticipated Outcome(s): min assist ambulatory with LRAD  PT Recommendation Recommendations for Other Services: Neuropsych consult;Therapeutic Recreation consult Therapeutic Recreation Interventions: Stress management;Kitchen group Follow Up Recommendations: Home health PT Patient destination: Home Equipment Recommended: Wheelchair (measurements);Wheelchair cushion (measurements);Rolling walker with 5" wheels  Skilled Therapeutic Intervention Pt received supine in bed and agreeable to PT. Supine>sit transfer with min assist and min cues for safety. PT instructed patient in PT Evaluation and initiated treatment intervention; see below for results. PT educated patient in [place], rehab potential, rehab goals, and discharge recommendations.  Patient returned to room and left sitting in recliner with call bell in reach and all needs met.      PT Evaluation Precautions/Restrictions   fall.  General   Vital Signs  Pain   Home Living/Prior Functioning Home Living Available Help at Discharge: Family;Available 24 hours/day Type of Home: House Home Access: Level entry Home Layout: One level;Other (Comment) Bathroom Shower/Tub: Sports coach: Yes  Lives With: Spouse Prior Function Level of Independence: Independent with basic ADLs  Able to Take Stairs?: Yes Driving: Yes Vocation: Retired Comments: drove; husband occasionally helpes with drying off legs due to unsteadiness Vision/Perception  Vision -  Assessment Tracking/Visual Pursuits: Decreased smoothness of horizontal tracking  Cognition Overall Cognitive Status: Impaired/Different from baseline Arousal/Alertness: Lethargic Orientation Level: Oriented to person;Oriented to place;Disoriented to time Attention: Focused;Sustained Focused Attention: Appears intact Sustained Attention: Impaired Sustained Attention Impairment: Verbal basic;Functional basic Memory: Impaired Memory Impairment: Decreased recall of new information;Decreased short term memory;Storage deficit Awareness: Impaired Awareness Impairment: Intellectual impairment Problem Solving: Impaired Problem Solving Impairment: Verbal basic;Functional basic Safety/Judgment: Impaired Sensation Sensation Light Touch: Impaired Detail Light Touch Impaired Details: Impaired LLE;Impaired RLE Proprioception: Appears Intact Stereognosis: Not tested Coordination Gross Motor Movements are Fluid and Coordinated: No Fine Motor Movements are Fluid and Coordinated: No Coordination and Movement Description: tremulous movement; reports PTA "whole life", slow discoordinated  reporting "less control" Motor  Motor Motor: Hemiplegia Motor - Skilled Clinical Observations: mild L hemiplegia as well as mild tremor in all for extremities  Mobility Bed Mobility Bed Mobility: Supine to Sit;Sit to Supine Supine to Sit: Minimal Assistance - Patient > 75% Sit to Supine: Minimal Assistance - Patient > 75% Transfers Transfers: Sit to [employer] Transfers Sit to Stand: Moderate Assistance - Patient 50-74%;Maximal Assistance - Patient 25-49% Stand to Sit: Moderate Assistance - Patient 50-74%;Maximal Assistance - Patient 25-49% Squat Pivot Transfers: Maximal Assistance - Patient 25-49% Transfer (Assistive device): Rolling walker Locomotion  Gait Ambulation: Yes Gait Assistance: Moderate Assistance - Patient 50-74%;Maximal Assistance - Patient 25-49% Gait Distance (Feet): 4  Feet Assistive device: Rolling walker Gait Gait: Yes Gait Pattern: Impaired Gait Pattern: Narrow base of support;Scissoring Stairs / Additional Locomotion Stairs: No Wheelchair Mobility Wheelchair Mobility: Yes Wheelchair Assistance: Minimal assistance - Patient >75% Wheelchair Propulsion: Both upper extremities Wheelchair Parts Management: Needs assistance Distance: 77f  Trunk/Postural Assessment  Cervical Assessment Cervical Assessment: Exceptions to WFL(head foreward) Thoracic  Assessment Thoracic Assessment: Exceptions to WFL(rounded shoulders) Lumbar Assessment Lumbar Assessment: Exceptions to WFL(posterior pelic tilt) Postural Control Postural Control: Deficits on evaluation(delayed)  Balance Balance Balance Assessed: Yes Dynamic Sitting Balance Dynamic Sitting - Level of Assistance: 5: Stand by assistance Dynamic Sitting - Balance Activities: Reaching for weighted objects Static Standing Balance Static Standing - Level of Assistance: 3: Mod assist Dynamic Standing Balance Dynamic Standing - Level of Assistance: 3: Mod assist;2: Max assist Extremity Assessment      RLE Assessment RLE Assessment: Exceptions to [facility] Active Range of Motion (AROM) Comments: ankle DF limited to -5 deg from neutral  General Strength Comments: grossly 4-/5  LLE Assessment LLE Assessment: Exceptions to [facility] General Strength Comments: grosslt 4-/5 except hip flexion 3+/5     Refer to Care Plan for Long Term Goals  Recommendations for other services: Neuropsych and Therapeutic Recreation  Stress management and Outing/community reintegration  Discharge Criteria: Patient will be discharged from PT if patient refuses treatment 3 consecutive times without medical reason, if treatment goals not met, if there is a change in medical status, if patient makes no progress towards goals or if patient is discharged from hospital.  The above assessment, treatment plan, treatment alternatives and  goals were discussed and mutually agreed upon: by patient  Lorie Phenix 04/17/2018, 1:53 PM

## 2018-04-17 NOTE — Evaluation (Signed)
Occupational Therapy Assessment and Plan  Patient Details  Name: Diane Short MRN: [id number] Date of Birth: 05-18-1956  OT Diagnosis: abnormal posture, ataxia, cognitive deficits, hemiplegia affecting non-dominant side and muscle weakness (generalized) Rehab Potential:   ELOS: 14-18   Today's Date: 04/17/2018 OT Individual Time: 2725-3664 OT Individual Time Calculation (min): 75 min     Problem List:  Patient Active Problem List   Diagnosis Date Noted  . Dysphagia, oropharyngeal   . Pressure injury of skin 04/11/2018  . Goals of care, counseling/discussion   . Palliative care by specialist   . Adult failure to thrive   . Endotracheal tube present   . SAH (subarachnoid hemorrhage) ([place]) 03/22/2018  . Malignant neoplasm of left breast in female, estrogen receptor negative ([place]) 01/07/2018  . Brain metastasis ([place]) 01/07/2018  . Cerebral aneurysm, nonruptured 03/13/2017    Past Medical History:  Past Medical History:  Diagnosis Date  . 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 Anxiety   . Arthritis   . Cancer [facility])    breast cancer  . Cerebral hemorrhage ([place]) 1986  . Depression    due to cancer dx  . Diverticulosis   . GERD (gastroesophageal reflux disease)   . Headache   . History of hiatal hernia   . Hypertension   . 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 Neutropenic fever ([place])    Chemo related  . Osteonecrosis ([place]) 2017-2018   Right Jaw bone  . Thrush, oral    Chemo related   Past Surgical History:  Past Surgical History:  Procedure Laterality Date  . BREAST SURGERY Left 11/2016   metastic to bone  . CATARACT EXTRACTION Right 03/02/2017  . CEREBRAL ANGIOGRAM  1986  . CESAREAN SECTION  1980  . IR 3D INDEPENDENT WKST  01/20/2017  . IR 3D INDEPENDENT WKST  05/26/2017  . IR ANGIO INTRA EXTRACRAN SEL COM CAROTID INNOMINATE UNI R MOD SED  04/09/2017  . IR ANGIO INTRA EXTRACRAN SEL INTERNAL CAROTID BILAT MOD SED  01/20/2017  . IR ANGIO INTRA EXTRACRAN SEL INTERNAL CAROTID  BILAT MOD SED  11/17/2017  . IR ANGIO INTRA EXTRACRAN SEL INTERNAL CAROTID BILAT MOD SED  03/23/2018  . IR ANGIO INTRA EXTRACRAN SEL INTERNAL CAROTID UNI R MOD SED  03/13/2017  . IR ANGIO VERTEBRAL SEL VERTEBRAL BILAT MOD SED  01/20/2017  . IR ANGIO VERTEBRAL SEL VERTEBRAL UNI L MOD SED  05/26/2017  . IR ANGIO VERTEBRAL SEL VERTEBRAL UNI L MOD SED  11/17/2017  . IR ANGIOGRAM FOLLOW UP STUDY  03/13/2017  . IR ANGIOGRAM FOLLOW UP STUDY  03/13/2017  . IR ANGIOGRAM FOLLOW UP STUDY  03/13/2017  . IR ANGIOGRAM FOLLOW UP STUDY  03/13/2017  . IR ANGIOGRAM FOLLOW UP STUDY  03/13/2017  . IR ANGIOGRAM FOLLOW UP STUDY  03/13/2017  . IR ANGIOGRAM FOLLOW UP STUDY  03/13/2017  . IR ANGIOGRAM FOLLOW UP STUDY  03/13/2017  . IR ANGIOGRAM FOLLOW UP STUDY  03/13/2017  . IR ANGIOGRAM FOLLOW UP STUDY  03/13/2017  . IR ANGIOGRAM FOLLOW UP STUDY  03/13/2017  . IR ANGIOGRAM FOLLOW UP STUDY  03/13/2017  . IR ANGIOGRAM FOLLOW UP STUDY  04/09/2017  . IR ANGIOGRAM FOLLOW UP STUDY  05/26/2017  . IR ANGIOGRAM FOLLOW UP STUDY  05/26/2017  . IR ANGIOGRAM FOLLOW UP STUDY  03/23/2018  . IR NEURO EACH ADD'L AFTER BASIC UNI LEFT (MS)  04/09/2017  . IR NEURO EACH ADD'L AFTER BASIC UNI  LEFT (MS)  03/23/2018  . IR NEURO EACH ADD'L AFTER BASIC UNI RIGHT (MS)  03/13/2017  . IR TRANSCATH/EMBOLIZ  03/13/2017  . IR TRANSCATH/EMBOLIZ  04/09/2017  . IR TRANSCATH/EMBOLIZ  05/26/2017  . IR TRANSCATH/EMBOLIZ  03/23/2018  . IR US GUIDE VASC ACCESS RIGHT  04/09/2017  . MASTECTOMY Left   . port a cathh  04/2016  . RADIOLOGY WITH ANESTHESIA N/A 03/13/2017   Procedure: stent coiling of right MCA aneurysm;  Surgeon: Consuella Lose, MD;  Location: [place];  Service: Radiology;  Laterality: N/A;  . RADIOLOGY WITH ANESTHESIA N/A 04/09/2017   Procedure: Arteriogram, pipeline embolization of aneruysm;  Surgeon: Consuella Lose, MD;  Location: [place];  Service: Radiology;  Laterality: N/A;  . RADIOLOGY WITH ANESTHESIA N/A 05/26/2017   Procedure: Stent supported coil  embolization;  Surgeon: Consuella Lose, MD;  Location: [place];  Service: Radiology;  Laterality: N/A;  . RADIOLOGY WITH ANESTHESIA N/A 03/23/2018   Procedure: IR WITH ANESTHESIA;  Surgeon: Consuella Lose, MD;  Location: [place];  Service: Radiology;  Laterality: N/A;  . TUBAL LIGATION  1983    Assessment & Plan Clinical Impression:  Diane Short is a 62 year old right-handed female who is married reportedly independent up until few weeks ago with complicated medical history to include progressive stage IV progressive breast cancer followed by Dr. Hinton Rao with current completion of second cycle of chemotherapy for palliative management. Patient recently had radiation stereotactic surgery for single solitary brain met as well as history of 5 intracerebral aneurysms 3 of which treated endovascularly. Noted progressive decline over the last few weeks with intermittent headaches. She was seen by EMS 03/22/2018 taken to [facility] for unresponsive episode requiring intubation. CT scan of the head showed subarachnoid hemorrhage and subsequently transferred to [facility] for further treatment. EEG completed showing general background slowing and no seizure activity. Echocardiogram with ejection fraction of 19% grade 1 diastolic dysfunction. CT angiogram of head and neck showed increased volume of subarachnoid hemorrhage predominantly located in the basal cisterns and sylvian fissures with intraventricular extension in a pattern most consistent with aneurysmal hemorrhage. 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 Kathyrn Sheriff. She remained intubated until 04/03/2018. Maintained on Dilantin/Keppra for seizure prophylaxis.Nimotop for blood pressure control which has been completed. Subcutaneous. Lovenox initiated for DVT prophylaxis 03/31/2018. She is on a dysphagia #2 nectar thick liquid diet. Hospital course palliative care follow-up therapy evaluations completed  with recommendations of physical medicine rehab consult.    Patient currently requires max with basic self-care skills secondary to muscle weakness, decreased cardiorespiratoy endurance, unbalanced muscle activation, ataxia, decreased coordination and decreased motor planning, decreased attention to left and decreased motor planning, decreased attention, decreased awareness, decreased problem solving, decreased safety awareness, decreased memory and delayed processing and decreased sitting balance, decreased standing balance, decreased postural control, hemiplegia and decreased balance strategies.  Prior to hospitalization, patient could complete BADL with min-S.  Patient will benefit from skilled intervention to decrease level of assist with basic self-care skills and increase independence with basic self-care skills prior to discharge home with care partner.  Anticipate patient will require 24 hour supervision and follow up home health.  OT - End of Session Activity Tolerance: Tolerates 30+ min activity with multiple rests Endurance Deficit: Yes OT Assessment Rehab Potential (ACUTE ONLY): Fair OT Patient demonstrates impairments in the following area(s): Balance;Cognition;Endurance;Motor;Perception;Safety;Sensory OT Basic ADL's Functional Problem(s): Eating;Grooming;Bathing;Dressing;Toileting OT Transfers Functional Problem(s): Toilet;Tub/Shower OT Plan OT Intensity: Minimum of 1-2 x/day, 45 to  90 minutes OT Frequency: 5 out of 7 days;Total of 15 hours over 7 days of combined therapies OT Duration/Estimated Length of Stay: 14-18 OT Treatment/Interventions: Balance/vestibular training;Cognitive remediation/compensation;Community reintegration;Discharge planning;Disease mangement/prevention;DME/adaptive equipment instruction;Functional mobility training;Neuromuscular re-education;Pain management;Patient/family education;Psychosocial support;Self Care/advanced ADL retraining;Skin care/wound  managment;Splinting/orthotics;Therapeutic Activities;Therapeutic Exercise;UE/LE Strength taining/ROM;UE/LE Coordination activities;Visual/perceptual remediation/compensation;Wheelchair propulsion/positioning OT Self Feeding Anticipated Outcome(s): S OT Basic Self-Care Anticipated Outcome(s): S OT Toileting Anticipated Outcome(s): S OT Bathroom Transfers Anticipated Outcome(s): S toilet, MIN A shower OT Recommendation Recommendations for Other Services: Neuropsych consult;Therapeutic Recreation consult Patient destination: Home Follow Up Recommendations: Home health OT Equipment Recommended: To be determined   Skilled Therapeutic Intervention  1:1. Pt educated on role/purpose of OT, CIR, ELOS, and POC. Pt completes supine>sitting with MOD A for LE management. Pt sits EOB for 40+ min statically and min dynamically with S-CGA while washing UB and donning new gown. Pt sit to stand with MOD A HHA and with RW on separate occasions. Pt able to take 2 shuffle steps forward towardsBSC, but then reports needing to sit d/t fear of falling. Pt able to transfer to Advanced Pain Management in stedy with mod A fading to min A to sit to stand. Pt requires overall max A for toileting for clothing management. Pt able to complete hygiene. Pt requires increased time throughout session for rest breaks, processing commands and BUE use. Exited session with pt supine in bed, call light in reach and all needs met.   OT Evaluation Precautions/Restrictions  Precautions Precautions: Fall Restrictions Weight Bearing Restrictions: No General Chart Reviewed: Yes Vital Signs Therapy Vitals Temp: 98.4 F (36.9 C) Temp Source: Oral Pulse Rate: 89 BP: 132/83 Patient Position (if appropriate): Lying Oxygen Therapy SpO2: 100 % O2 Device: Room Air Pain Pain Assessment Pain Score: 0-No pain Home Living/Prior Functioning Home Living Family/patient expects to be discharged to:: Private residence Living Arrangements: Spouse/significant  other Available Help at Discharge: Family, Available 24 hours/day Type of Home: House Home Access: Level entry Home Layout: One level, Other (Comment) Bathroom Shower/Tub: Tub/shower unit, Architectural technologist: Standard Bathroom Accessibility: Yes  Lives With: Spouse IADL History Homemaking Responsibilities: Yes Meal Prep Responsibility: Secondary Laundry Responsibility: Secondary Cleaning Responsibility: Secondary Bill Paying/Finance Responsibility: Secondary Shopping Responsibility: Secondary Homemaking Comments: Wants to be able to stand at the stove and cook Current License: Yes Mode of Transportation: Car Prior Function Level of Independence: Independent with basic ADLs Comments: drove; husband occasionally helpes with drying off legs due to unsteadiness ADL   Vision Baseline Vision/History: Wears glasses Wears Glasses: Reading only Patient Visual Report: No change from baseline Vision Assessment?: Yes Ocular Range of Motion: Restricted on the left Tracking/Visual Pursuits: Decreased smoothness of horizontal tracking Saccades: Impaired - to be further tested in functional context Convergence: Impaired - to be further tested in functional context Additional Comments: Pt able to read clock on the wall Perception    Praxis   Cognition Orientation Level: Person;Place;Situation Person: Oriented Situation: Oriented Year: 2019 Month: September Day of Week: Incorrect Memory: Impaired Immediate Memory Recall: Sock;Blue;Bed Memory Recall: Blue Memory Recall Blue: Without Cue Attention: Focused;Sustained Sustained Attention: Impaired Sensation Sensation Light Touch: Impaired Detail Light Touch Impaired Details: Impaired LLE;Impaired RLE Proprioception: Appears Intact Stereognosis: Not tested Coordination Gross Motor Movements are Fluid and Coordinated: No Fine Motor Movements are Fluid and Coordinated: No Coordination and Movement Description: tremulous  movement; reports PTA "whole life", slow discoordinated  reporting "less control" Motor  Motor Motor: Hemiplegia Motor - Skilled Clinical Observations: mild L hemi Mobility  Transfers Stand to  Sit: Moderate Assistance - Patient 50-74%  Trunk/Postural Assessment  Cervical Assessment Cervical Assessment: Exceptions to WFL(head foreward) Thoracic Assessment Thoracic Assessment: Exceptions to WFL(rounded shoulders) Lumbar Assessment Lumbar Assessment: Exceptions to WFL(posterior pelic tilt) Postural Control Postural Control: Deficits on evaluation(delayed)  Balance Balance Balance Assessed: Yes Dynamic Sitting Balance Sitting balance - Comments: cues and minguard for safety due to lateral lean; Supervision statically Static Standing Balance Static Standing - Level of Assistance: 3: Mod assist Static Standing - Comment/# of Minutes: posterior lean Extremity/Trunk Assessment RUE Assessment RUE Assessment: Exceptions to [facility] Active Range of Motion (AROM) Comments: 0-90 shouler, full ROM elbow, wrist, digit, generalized weakness LUE Assessment LUE Assessment: Exceptions to [facility] Active Range of Motion (AROM) Comments: 0-80 shouler, full ROM elbow, wrist, digit, generalized weakness     Refer to Care Plan for Long Term Goals  Recommendations for other services: Neuropsych and Therapeutic Recreation  Pet therapy, Kitchen group, Stress management and Outing/community reintegration   Discharge Criteria: Patient will be discharged from OT if patient refuses treatment 3 consecutive times without medical reason, if treatment goals not met, if there is a change in medical status, if patient makes no progress towards goals or if patient is discharged from hospital.  The above assessment, treatment plan, treatment alternatives and goals were discussed and mutually agreed upon: by patient  Tonny Branch 04/17/2018, 8:14 AM

## 2018-04-17 NOTE — Plan of Care (Signed)
  Problem: Consults Goal: RH GENERAL PATIENT EDUCATION Description See Patient Education module for education specifics. Outcome: Progressing Goal: Skin Care Protocol Initiated - if Braden Score 18 or less Description If consults are not indicated, leave blank or document N/A Outcome: Progressing   Problem: RH BLADDER ELIMINATION Goal: RH STG MANAGE BLADDER WITH ASSISTANCE Description STG Manage Bladder With min-mod Assistance  Outcome: Progressing   Problem: RH SKIN INTEGRITY Goal: RH STG SKIN FREE OF INFECTION/BREAKDOWN Outcome: Progressing Goal: RH STG MAINTAIN SKIN INTEGRITY WITH ASSISTANCE Description STG Maintain Skin Integrity With min-mod Assistance.  Outcome: Progressing   Problem: RH SAFETY Goal: RH STG ADHERE TO SAFETY PRECAUTIONS W/ASSISTANCE/DEVICE Description STG Adhere to Safety Precautions With min-mod Assistance/Device.  Outcome: Progressing   Problem: RH PAIN MANAGEMENT Goal: RH STG PAIN MANAGED AT OR BELOW PT'S PAIN GOAL Outcome: Progressing   Problem: RH KNOWLEDGE DEFICIT GENERAL Goal: RH STG INCREASE KNOWLEDGE OF SELF CARE AFTER HOSPITALIZATION Outcome: Progressing   Problem: RH Vision Goal: RH LTG Vision (Specify) Outcome: Progressing   Problem: RH Pre-functional/Other (Specify) Goal: RH LTG Pre-functional (Specify) Outcome: Progressing Goal: RH LTG Interdisciplinary (Specify) 1 Description RH LTG Interdisciplinary (Specify)1 Outcome: Progressing Goal: RH LTG Interdisciplinary (Specify) 2 Description RH LTG Interdisciplinary (Specify) 2  Outcome: Progressing   Problem: RH BOWEL ELIMINATION Goal: RH STG MANAGE BOWEL WITH ASSISTANCE Description STG Manage Bowel with min  Assistance.  Outcome: Not Progressing Goal: RH STG MANAGE BOWEL W/MEDICATION W/ASSISTANCE Description STG Manage Bowel with Medication with min-mod Assistance.  Outcome: Not Progressing

## 2018-04-17 NOTE — IPOC Note (Addendum)
Overall Plan of Care [facility]) Patient Details Name: Diane Short MRN: [id number] DOB: 1956/05/30  Admitting Diagnosis: [facility] Problems: Active Problems:   SAH (subarachnoid hemorrhage) ([place])   Dysphagia, oropharyngeal   Right hemiparesis (HCC)   Stage IV breast cancer in female [facility])   Essential hypertension   Seizure prophylaxis   Anemia of chronic disease   Hyperglycemia   Labile blood glucose   Hypoglycemia   Transaminitis     Functional Problem List: Nursing Behavior, Bladder, Bowel, Medication Management, Pain, Safety  PT Balance, Behavior, Edema, Endurance, Motor, Pain, Nutrition, Perception, Safety, Sensory, Skin Integrity  OT Balance, Cognition, Endurance, Motor, Perception, Safety, Sensory  SLP Cognition, Linguistic, Nutrition  TR         Basic ADL's: OT Eating, Grooming, Bathing, Dressing, Toileting     Advanced  ADL's: OT       Transfers: PT Bed Mobility, Bed to Chair, Car, Manufacturing systems engineer, Metallurgist: PT Ambulation, Emergency planning/management officer, Stairs     Additional Impairments: OT    SLP Swallowing, Communication, Social Cognition expression Problem Solving, Memory, Social Interaction, Attention, Awareness  TR      Anticipated Outcomes Item Anticipated Outcome  Self Feeding S  Swallowing  Mod I   Basic self-care  S  Toileting  S   Bathroom Transfers S toilet, MIN A shower  Bowel/Bladder  Cont B/B LBM 04/16/18  Transfers  Supervision assist transfers   Locomotion  min assist ambulatory with LRAD   Communication  Mod I  Cognition  Supervision-Min A   Pain  0/10 denies pain, tolerable 4/10. assess pain, administer pain regimen as needed  Safety/Judgment  Refrain from falls/injuries    Therapy Plan: PT Intensity: Minimum of 1-2 x/day ,45 to 90 minutes PT Frequency: 5 out of 7 days PT Duration Estimated Length of Stay: 21-25 days  OT Intensity: Minimum of 1-2 x/day, 45 to 90 minutes OT Frequency: 5 out of 7  days, Total of 15 hours over 7 days of combined therapies OT Duration/Estimated Length of Stay: 14-18 SLP Intensity: Minumum of 1-2 x/day, 30 to 90 minutes SLP Frequency: 3 to 5 out of 7 days SLP Duration/Estimated Length of Stay: 14-17 days     Team Interventions: Nursing Interventions Patient/Family Education, Bladder Management, Bowel Management, Pain Management, Medication Management, Skin Care/Wound Management  PT interventions Ambulation/gait training, Balance/vestibular training, Cognitive remediation/compensation, Community reintegration, Discharge planning, Disease management/prevention, DME/adaptive equipment instruction, Functional electrical stimulation, Functional mobility training, Neuromuscular re-education, Pain management, Patient/family education, Psychosocial support, Skin care/wound management, Splinting/orthotics, Stair training, Therapeutic Activities, Therapeutic Exercise, UE/LE Strength taining/ROM, UE/LE Coordination activities, Wheelchair propulsion/positioning, Visual/perceptual remediation/compensation  OT Interventions Training and development officer, Cognitive remediation/compensation, Community reintegration, Discharge planning, Disease mangement/prevention, DME/adaptive equipment instruction, Functional mobility training, Neuromuscular re-education, Pain management, Patient/family education, Psychosocial support, Self Care/advanced ADL retraining, Skin care/wound managment, Splinting/orthotics, Therapeutic Activities, Therapeutic Exercise, UE/LE Strength taining/ROM, UE/LE Coordination activities, Visual/perceptual remediation/compensation, Wheelchair propulsion/positioning  SLP Interventions Cognitive remediation/compensation, Cueing hierarchy, Dysphagia/aspiration precaution training, Functional tasks, Patient/family education, Therapeutic Activities, Speech/Language facilitation, Internal/external aids, Environmental controls  TR Interventions    SW/CM Interventions  Discharge Planning, Psychosocial Support, Patient/Family Education   Barriers to Discharge MD  Medical stability  Nursing      PT Medical stability, Wound Care, Pending chemo/radiation    OT      SLP      SW       Team Discharge Planning: Destination: PT-Home ,OT- Home , SLP-Home Projected Follow-up: PT-Home health  PT, OT-  Home health OT, SLP-Home Health SLP, 24 hour supervision/assistance, Outpatient SLP Projected Equipment Needs: PT-Wheelchair (measurements), Wheelchair cushion (measurements), Rolling walker with 5" wheels, OT- To be determined, SLP-None recommended by SLP Equipment Details: PT- , OT-  Patient/family involved in discharge planning: PT- Patient, Family member/caregiver,  OT-Patient, SLP-Patient, Family member/caregiver  MD ELOS: 14-17 days. Medical Rehab Prognosis:  Fair Assessment: 62 year old right-handed female who is married reportedly independent up until few weeks ago with complicated medical history to include progressive stage IV progressive breast cancer followed by Dr. Hinton Rao with current completion of second cycle of chemotherapy for palliative management. Patient recently had radiation stereotactic surgery for single solitary brain met as well as history of 5 intracerebral aneurysms 3 of which treated endovascularly. Noted progressive decline over the last few weeks with intermittent headaches. She was seen by EMS 03/22/2018 taken to [facility] for unresponsive episode requiring intubation. CT scan of the head showed subarachnoid hemorrhage and subsequently transferred to [facility] for further treatment. EEG completed showing general background slowing and no seizure activity. Echocardiogram with ejection fraction of 93% grade 1 diastolic dysfunction. CT angiogram of head and neck showed increased volume of subarachnoid hemorrhage predominantly located in the basal cisterns and sylvian fissures with intraventricular extension in a pattern most  consistent with aneurysmal hemorrhage. 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 Kathyrn Sheriff. She remained intubated until 04/03/2018. Maintained on Dilantin/Keppra for seizure prophylaxis.Nimotop for blood pressure control which has been completed. She is on a dysphagia #2 thin liquid diet. Hospital course palliative care follow-up.  Patient with resulting functional deficits with mobility, endurance, self-care.  We will set goals for Supervision/Mod I with PT/OT and Supervision/Min A with SLP.  See Team Conference Notes for weekly updates to the plan of care

## 2018-04-17 NOTE — Progress Notes (Signed)
[place] PHYSICAL MEDICINE & REHABILITATION PROGRESS NOTE  Subjective/Complaints: Patient seen laying in bed this morning.  She states she slept well overnight.  She states she is ready to begin therapies today.  She has questions about it for PICC line can be DC'd.  Discussed with nursing as well.  ROS: Denies CP, S OB, nausea, vomiting, diarrhea.  Objective: Vital Signs: Blood pressure 132/83, pulse 89, temperature 98.4 F (36.9 C), temperature source Oral, resp. rate 18, height 5' 4.5" (1.638 m), weight 87.7 kg, last menstrual period 08/14/2010, SpO2 100 %. No results found. Recent Labs    04/15/18 0531 04/16/18 0600  WBC 5.8 6.6  HGB 8.4* 8.5*  HCT 28.5* 28.4*  PLT 169 182   Recent Labs    04/15/18 0531 04/16/18 0600  NA 138 140  K 3.6 3.7  CL 105 102  CO2 26 27  GLUCOSE 105* 106*  BUN 11 7  CREATININE 0.58 0.63  CALCIUM 8.6* 9.2    Physical Exam: BP 132/83 (BP Location: Right Leg)   Pulse 89   Temp 98.4 F (36.9 C) (Oral)   Resp 18   Ht 5' 4.5" (1.638 m)   Wt 87.7 kg   LMP 08/14/2010 (Exact Date)   SpO2 100%   BMI 32.67 kg/m  Constitutional: Well-developed.  NAD.  Obese. HENT: Normocephalic.  Atraumatic. Eyes: EOMI.  No discharge. Cardiovascular: RRR.  No JVD. Respiratory: Effort normal and breath sounds normal.  GI: She exhibits no distension.  Bowel sounds normal. Musculoskeletal: No edema or tenderness in extremities. Neurological:  Alert.  Makes good eye contact with examiner.  Follows full commands.  Some delays with processing  Motor: RUE/RLE 4-4+/5.  LUE/LLE: 4+/5 Sensation intact light touch Skin: Skin is warm.  Intact. Psychiatric: Pleasant and cooperative   Assessment/Plan: 1. Functional deficits secondary to [facility] which require 3+ hours per day of interdisciplinary therapy in a comprehensive inpatient rehab setting.  Physiatrist is providing close team supervision and 24 hour management of active medical problems listed  below.  Physiatrist and rehab team continue to assess barriers to discharge/monitor patient progress toward functional and medical goals  Care Tool:  Bathing    Body parts bathed by patient: Right arm, Left arm, Chest, Abdomen, Front perineal area, Face   Body parts bathed by helper: Buttocks, Left upper leg, Right lower leg, Left lower leg     Bathing assist Assist Level: Moderate Assistance - Patient 50 - 74%     Upper Body Dressing/Undressing Upper body dressing   What is the patient wearing?: Hospital gown only(no clothing available)    Upper body assist      Lower Body Dressing/Undressing Lower body dressing      What is the patient wearing?: Hospital gown only     Lower body assist       Toileting Toileting    Toileting assist Assist for toileting: Maximal Assistance - Patient 25 - 49%     Transfers Chair/bed transfer  Transfers assist           Locomotion Ambulation   Ambulation assist        Assistive device: (stedy)     Walk 10 feet activity   Assist           Walk 50 feet activity   Assist           Walk 150 feet activity   Assist           Walk 10 feet on uneven surface  activity   Assist           Wheelchair     Assist               Wheelchair 50 feet with 2 turns activity    Assist            Wheelchair 150 feet activity     Assist            Medical Problem List and Plan:  1. Right side weakness, dysphagia secondary to aneurysmal bleed/SAH   Begin CIR  Notes reviewed- history of stage IV breast cancer with brain met and intracerebral aneurysms, images reviewed-CT head showing left SAH, labs reviewed 2. DVT Prophylaxis/Anticoagulation: Subcutaneous Lovenox. Monitor for any bleeding episodes  3. Pain Management: Tylenol as needed  4. Mood: Seroquel 25 mg nightly  5. Neuropsych: This patient is capable of making decisions on her own behalf.  6. Skin/Wound Care:  Routine skin checks  7. Fluids/Electrolytes/Nutrition: Routine in and outs  BMP within acceptable range on 10/4 8. Stage IV breast cancer. Palliative chemotherapy completed. Follow-up Dr. Hinton Rao   Palliative care following patient  9. Dysphagia. Dysphasia #2 nectar liquids, advance to D2 thins  Continue to advance as tolerated 10. Hypertension. Norvasc 10 mg daily, Coreg 25 mg twice daily, labetalol 100 mg 3 times daily   Monitor with increased mobility 11. Seizure prophylaxis. Dilantin 100 mg 3 times daily, Keppra 750 mg twice daily  12.  Anemia of chronic disease  Hemoglobin 8.5 on 10/4  Continue to monitor  LOS: 1 days A FACE TO FACE EVALUATION WAS PERFORMED  Seylah Wernert Lorie Phenix 04/17/2018, 12:27 PM

## 2018-04-18 ENCOUNTER — Inpatient Hospital Stay (HOSPITAL_COMMUNITY): Payer: Medicaid Other

## 2018-04-18 DIAGNOSIS — R739 Hyperglycemia, unspecified: Secondary | ICD-10-CM

## 2018-04-18 LAB — GLUCOSE, CAPILLARY: Glucose-Capillary: 111 mg/dL — ABNORMAL HIGH (ref 70–99)

## 2018-04-18 MED ORDER — LOPERAMIDE HCL 2 MG PO CAPS
2.0000 mg | ORAL_CAPSULE | Freq: Four times a day (QID) | ORAL | Status: DC
  Administered 2018-04-18 – 2018-04-20 (×6): 2 mg via ORAL
  Filled 2018-04-18 (×7): qty 1

## 2018-04-18 NOTE — Progress Notes (Signed)
PICC line d/c 'ed per order.  Site cleaned with CHG, covered with vaseline guaze and dry 2x2. SIte with mild bruising present.  No signs of infection present.  Verbalizes understanding of site care and interventions for bleeding or infection.  Husband at bedside.  RN aware.

## 2018-04-18 NOTE — Progress Notes (Signed)
[place] PHYSICAL MEDICINE & REHABILITATION PROGRESS NOTE  Subjective/Complaints: Patient seen sitting up at the edge of her bed this morning.  She states she slept well overnight.  She states she had a good first day of therapies yesterday.  ROS: Denies CP, S OB, nausea, vomiting, diarrhea.  Objective: Vital Signs: Blood pressure (!) 139/98, pulse 94, temperature 98.4 F (36.9 C), temperature source Oral, resp. rate 18, height 5' 4.5" (1.638 m), weight 88.9 kg, last menstrual period 08/14/2010, SpO2 100 %. No results found. Recent Labs    04/16/18 0600  WBC 6.6  HGB 8.5*  HCT 28.4*  PLT 182   Recent Labs    04/16/18 0600  NA 140  K 3.7  CL 102  CO2 27  GLUCOSE 106*  BUN 7  CREATININE 0.63  CALCIUM 9.2    Physical Exam: BP (!) 139/98 (BP Location: Right Leg)   Pulse 94   Temp 98.4 F (36.9 C) (Oral)   Resp 18   Ht 5' 4.5" (1.638 m)   Wt 88.9 kg   LMP 08/14/2010 (Exact Date)   SpO2 100%   BMI 33.12 kg/m  Constitutional: Well-developed.  NAD.  Obese. HENT: Normocephalic.  Atraumatic. Eyes: EOMI.  No discharge. Cardiovascular: RRR.  No JVD. Respiratory: Effort normal and breath sounds normal.  GI: She exhibits no distension.  Bowel sounds normal. Musculoskeletal: No edema or tenderness in extremities. Neurological:  Alert.  Makes good eye contact with examiner.  Follows full commands.  Some delays with processing  Motor: RUE/RLE 4--4+/5.  LUE/LLE: 4+/5 Sensation intact light touch Skin: Skin is warm.  Intact. Psychiatric: Pleasant and cooperative   Assessment/Plan: 1. Functional deficits secondary to [facility] which require 3+ hours per day of interdisciplinary therapy in a comprehensive inpatient rehab setting.  Physiatrist is providing close team supervision and 24 hour management of active medical problems listed below.  Physiatrist and rehab team continue to assess barriers to discharge/monitor patient progress toward functional and medical  goals  Care Tool:  Bathing    Body parts bathed by patient: Right arm, Left arm, Chest, Abdomen, Front perineal area, Face, Right upper leg, Left upper leg, Right lower leg   Body parts bathed by helper: Buttocks     Bathing assist Assist Level: Minimal Assistance - Patient > 75%     Upper Body Dressing/Undressing Upper body dressing   What is the patient wearing?: Pull over shirt    Upper body assist Assist Level: Moderate Assistance - Patient 50 - 74%    Lower Body Dressing/Undressing Lower body dressing      What is the patient wearing?: Pants, Incontinence brief     Lower body assist Assist for lower body dressing: Maximal Assistance - Patient 25 - 49%     Toileting Toileting    Toileting assist Assist for toileting: Maximal Assistance - Patient 25 - 49%     Transfers Chair/bed transfer  Transfers assist  Chair/bed transfer activity did not occur: N/A  Chair/bed transfer assist level: Dependent - mechanical lift(stedy)     Locomotion Ambulation   Ambulation assist      Assist level: Moderate Assistance - Patient 50 - 74% Assistive device: Walker-rolling Max distance: 4   Walk 10 feet activity   Assist  Walk 10 feet activity did not occur: Safety/medical concerns        Walk 50 feet activity   Assist Walk 50 feet with 2 turns activity did not occur: Safety/medical concerns  Walk 150 feet activity   Assist Walk 150 feet activity did not occur: Safety/medical concerns         Walk 10 feet on uneven surface  activity   Assist Walk 10 feet on uneven surfaces activity did not occur: Safety/medical concerns         Wheelchair     Assist   Type of Wheelchair: Manual    Wheelchair assist level: Minimal Assistance - Patient > 75% Max wheelchair distance: 45    Wheelchair 50 feet with 2 turns activity    Assist        Assist Level: Minimal Assistance - Patient > 75%   Wheelchair 150 feet activity      Assist Wheelchair 150 feet activity did not occur: Safety/medical concerns          Medical Problem List and Plan:  1. Right side weakness, dysphagia secondary to aneurysmal bleed/SAH   Continue CIR 2. DVT Prophylaxis/Anticoagulation: Subcutaneous Lovenox. Monitor for any bleeding episodes  3. Pain Management: Tylenol as needed  4. Mood: Seroquel 25 mg nightly  5. Neuropsych: This patient is capable of making decisions on her own behalf.  6. Skin/Wound Care: Routine skin checks  7. Fluids/Electrolytes/Nutrition: Routine in and outs  BMP within acceptable range on 10/4 8. Stage IV breast cancer. Palliative chemotherapy completed. Follow-up Dr. Hinton Rao   Palliative care following patient  9. Dysphagia. Dysphasia #2 nectar liquids, advance to D2 thins  Continue to advance as tolerated 10. Hypertension. Norvasc 10 mg daily, Coreg 25 mg twice daily, labetalol 100 mg 3 times daily   Slightly elevated on 10/6 11. Seizure prophylaxis. Dilantin 100 mg 3 times daily, Keppra 750 mg twice daily  12.  Anemia of chronic disease  Hemoglobin 8.5 on 10/4  Continue to monitor 13.  Hyperglycemia  Will order HbA1c with next set of labs  CBGs ordered  LOS: 2 days A FACE TO FACE EVALUATION WAS PERFORMED  Ankit Lorie Phenix 04/18/2018, 5:45 PM

## 2018-04-18 NOTE — Progress Notes (Signed)
Occupational Therapy Session Note  Patient Details  Name: Diane Short MRN: [id number] Date of Birth: 19-Aug-1955  Today's Date: 04/18/2018 OT Individual Time: 1020-1103 OT Individual Time Calculation (min): 43 min    Short Term Goals: Week 1:  OT Short Term Goal 1 (Week 1): Pt will don shirt wiht min A OT Short Term Goal 2 (Week 1): Pt will transfer to toilet/BSC wiht MOD A of 1 caregiver with LRAD OT Short Term Goal 3 (Week 1): Pt will don pants with min A OT Short Term Goal 4 (Week 1): Pt will don footwear with min A and AE PRN  Skilled Therapeutic Interventions/Progress Updates:    1:1. Pt received seated in recliner requesting to bathe and dress. Pt sit to stand for bathing and dressing from recliner with mod fading to min A. Pt completes UB bathing with A to wash back and steady A for standing balance while washing buttocks (A for thoroughness). Pt able to cross BLE into seated figure 4 to doff socks and wash feet. Pt with stress incontinence during sit to stand. Pt dons shirt with A to orient to self despite increased time and question cues for noticing/responding to backwards orientation, A to thread BUE, and pt able to pull overhead/trunk. Pt dons LLE into pants and requries  A for RUE/advancing pants past hips in standing. OT dons non skid socks d/t fatigue. Pt requesting to toilet and transfers via stedy and touching A sit to stand in stedy. Pt left seated in [facility] with RN in room to finish toileting  Therapy Documentation Precautions:  Precautions Precautions: Fall Restrictions Weight Bearing Restrictions: No   Therapy/Group: Individual Therapy  Tonny Branch 04/18/2018, 12:17 PM

## 2018-04-19 ENCOUNTER — Inpatient Hospital Stay (HOSPITAL_COMMUNITY): Payer: Medicaid Other | Admitting: Speech Pathology

## 2018-04-19 ENCOUNTER — Inpatient Hospital Stay (HOSPITAL_COMMUNITY): Payer: Medicaid Other | Admitting: Physical Therapy

## 2018-04-19 ENCOUNTER — Inpatient Hospital Stay (HOSPITAL_COMMUNITY): Payer: Medicaid Other | Admitting: Occupational Therapy

## 2018-04-19 DIAGNOSIS — E162 Hypoglycemia, unspecified: Secondary | ICD-10-CM

## 2018-04-19 DIAGNOSIS — R7401 Elevation of levels of liver transaminase levels: Secondary | ICD-10-CM

## 2018-04-19 DIAGNOSIS — R7309 Other abnormal glucose: Secondary | ICD-10-CM

## 2018-04-19 DIAGNOSIS — R740 Nonspecific elevation of levels of transaminase and lactic acid dehydrogenase [LDH]: Secondary | ICD-10-CM

## 2018-04-19 LAB — COMPREHENSIVE METABOLIC PANEL
ALK PHOS: 103 U/L (ref 38–126)
ALT: 74 U/L — ABNORMAL HIGH (ref 0–44)
ANION GAP: 9 (ref 5–15)
AST: 64 U/L — ABNORMAL HIGH (ref 15–41)
Albumin: 3.3 g/dL — ABNORMAL LOW (ref 3.5–5.0)
BUN: 5 mg/dL — AB (ref 6–20)
CALCIUM: 8.9 mg/dL (ref 8.9–10.3)
CO2: 25 mmol/L (ref 22–32)
Chloride: 102 mmol/L (ref 98–111)
Creatinine, Ser: 0.68 mg/dL (ref 0.44–1.00)
GFR calc Af Amer: >60 mL/min (ref >60)
GLUCOSE: 117 mg/dL — AB (ref 70–99)
POTASSIUM: 3.7 mmol/L (ref 3.5–5.1)
Sodium: 136 mmol/L (ref 135–145)
TOTAL PROTEIN: 7 g/dL (ref 6.5–8.1)
Total Bilirubin: 0.7 mg/dL (ref 0.3–1.2)

## 2018-04-19 LAB — CBC WITH DIFFERENTIAL/PLATELET
Abs Immature Granulocytes: 0 10*3/uL (ref 0.0–0.1)
Basophils Absolute: 0 10*3/uL (ref 0.0–0.1)
Basophils Relative: 0 %
EOS ABS: 0.2 10*3/uL (ref 0.0–0.7)
EOS PCT: 1 %
HCT: 34.5 % — ABNORMAL LOW (ref 36.0–46.0)
HEMOGLOBIN: 10.5 g/dL — AB (ref 12.0–15.0)
Immature Granulocytes: 0 %
LYMPHS ABS: 1.2 10*3/uL (ref 0.7–4.0)
LYMPHS PCT: 11 %
MCH: 31 pg (ref 26.0–34.0)
MCHC: 30.4 g/dL (ref 30.0–36.0)
MCV: 101.8 fL — AB (ref 78.0–100.0)
MONO ABS: 0.9 10*3/uL (ref 0.1–1.0)
MONOS PCT: 8 %
Neutro Abs: 8.5 10*3/uL — ABNORMAL HIGH (ref 1.7–7.7)
Neutrophils Relative %: 80 %
Platelets: 216 10*3/uL (ref 150–400)
RBC: 3.39 MIL/uL — ABNORMAL LOW (ref 3.87–5.11)
RDW: 18.8 % — AB (ref 11.5–15.5)
WBC: 10.8 10*3/uL — ABNORMAL HIGH (ref 4.0–10.5)

## 2018-04-19 LAB — HEMOGLOBIN A1C
HEMOGLOBIN A1C: 5.7 % — AB (ref 4.8–5.6)
MEAN PLASMA GLUCOSE: 116.89 mg/dL

## 2018-04-19 LAB — GLUCOSE, CAPILLARY
GLUCOSE-CAPILLARY: 53 mg/dL — AB (ref 70–99)
GLUCOSE-CAPILLARY: 99 mg/dL (ref 70–99)
Glucose-Capillary: 192 mg/dL — ABNORMAL HIGH (ref 70–99)
Glucose-Capillary: 119 mg/dL — ABNORMAL HIGH (ref 70–99)
Glucose-Capillary: 99 mg/dL (ref 70–99)

## 2018-04-19 MED ORDER — CHOLECALCIFEROL 10 MCG (400 UNIT) PO TABS
800.0000 [IU] | ORAL_TABLET | Freq: Every day | ORAL | Status: DC
  Administered 2018-04-20 – 2018-04-22 (×3): 800 [IU] via ORAL
  Filled 2018-04-19 (×3): qty 2

## 2018-04-19 MED ORDER — PHENYTOIN SODIUM EXTENDED 100 MG PO CAPS
300.0000 mg | ORAL_CAPSULE | Freq: Every day | ORAL | Status: DC
  Administered 2018-04-19 – 2018-04-21 (×3): 300 mg via ORAL
  Filled 2018-04-19 (×3): qty 3

## 2018-04-19 MED ORDER — LEVETIRACETAM 750 MG PO TABS
750.0000 mg | ORAL_TABLET | Freq: Two times a day (BID) | ORAL | Status: DC
  Administered 2018-04-19 – 2018-04-22 (×6): 750 mg via ORAL
  Filled 2018-04-19 (×6): qty 1

## 2018-04-19 MED ORDER — ENOXAPARIN SODIUM 40 MG/0.4ML ~~LOC~~ SOLN
40.0000 mg | SUBCUTANEOUS | Status: DC
  Administered 2018-04-20 – 2018-04-22 (×3): 40 mg via SUBCUTANEOUS
  Filled 2018-04-19 (×3): qty 0.4

## 2018-04-19 NOTE — Progress Notes (Signed)
Hypoglycemic Event  CBG: 52  Treatment: 15 GM carbohydrate snack  Symptoms: None  Follow-up CBG: Time:745 CBG Result:192 Possible Reasons for Event: Unknown  Comments/MD notified:dr. patel notified.    Diane Short, [employer]

## 2018-04-19 NOTE — Progress Notes (Signed)
Occupational Therapy Session Note  Patient Details  Name: Diane Short MRN: [id number] Date of Birth: 04-08-1956  Today's Date: 04/19/2018 OT Individual Time: 1100-1155 OT Individual Time Calculation (min): 55 min    Short Term Goals: Week 1:  OT Short Term Goal 1 (Week 1): Pt will don shirt wiht min A OT Short Term Goal 2 (Week 1): Pt will transfer to toilet/BSC wiht MOD A of 1 caregiver with LRAD OT Short Term Goal 3 (Week 1): Pt will don pants with min A OT Short Term Goal 4 (Week 1): Pt will don footwear with min A and AE PRN  Skilled Therapeutic Interventions/Progress Updates:    Pt supine in bed upon entry with no reports of pain however reports of fatigue from previous sessions- husband present. Pt performed stand pivot with RW min-mod A depending on surface and squat pivot transfers with mod A. See below for details regarding self care tasks. Of note, pt educated on energy conservation techniques and breath support strategies during bathing tasks due to SOB with good carryover.   Due to fatigue, pt performed lateral leans while seated for periarea care. Education and discussion with pt and husband regarding balance between maximizing pt's independence/building endurance during self care tasks and husband assisting when pt fatigued. Ongoing education and carryover needed with husband still reporting by end of session "I'll just completely bathe her if she's tired." however pt able to do so herself.   Therapist demonstrated stand pivot transfer with RW from w/c <> BSC and EOB <> BSC with pt and husband then able to return demonstrate technique- husband checked off on safety plan for performing bedside stand pivot transfers to [facility] with pt. Education provided regarding safety during transfer with husband assisting with clothing mgmt when pt balanced and stable in standing position rather than during transfer- verbal comprehension from husband. Pt left in room with all needs in reach and  chair alarm set, husband present.   Therapy Documentation Precautions:  Precautions Precautions: Fall Restrictions Weight Bearing Restrictions: No ADL: ADL Grooming: Setup Where Assessed-Grooming: Sitting at sink, Wheelchair Upper Body Bathing: Supervision/safety Where Assessed-Upper Body Bathing: Shower Lower Body Bathing: Supervision/safety Where Assessed-Lower Body Bathing: Shower Upper Body Dressing: Supervision/safety Where Assessed-Upper Body Dressing: Sitting at sink, Wheelchair Lower Body Dressing: Moderate assistance Where Assessed-Lower Body Dressing: Teaching laboratory technician: Moderate assistance Social research officer, government Method: Radiographer, therapeutic: Radio broadcast assistant, Grab bars   Therapy/Group: Individual Therapy  Ingris Pasquarella 04/19/2018, 2:07 PM

## 2018-04-19 NOTE — Progress Notes (Signed)
Physical Therapy Session Note  Patient Details  Name: Diane Short MRN: [id number] Date of Birth: [birth year]  Today's Date: 04/19/2018 PT Individual Time: 0900-1000 PT Individual Time Calculation (min): 60 min   Short Term Goals: Week 1:  PT Short Term Goal 1 (Week 1): Pt will perform bed<>WC transfer with mod assist  PT Short Term Goal 2 (Week 1): Pt will ambulate 61ft with mod assist  PT Short Term Goal 3 (Week 1): Pt will propell WC 179ft with supervision assist  PT Short Term Goal 4 (Week 1): Pt will perform bed mobiltiy with supervision assist   Skilled Therapeutic Interventions/Progress Updates:    Pt received laying in bed with nurse and husband [name]) present. Pt agreeable to PT treatment. MinA bed mobility with HOB elevated. Min/modA sit>< stand throughout session. MinA stand pivot transfer x 4 trials, 2x each direction; husband provided assistance for second 2 trials. Husband educated on position and hand placement for guarding/assisting. TotalA w/c transport for energy and time conservation. MinA gait 31ft with HHAx1, hall rail, and w/c follow. Pt able to complete without rest breaks, but very slow gait speed, decreased stride length, and narrow BOS. Seated reaching balance activity by matching playing cards from side to side, from below, and from above; pt inconsistently matched cards. Pt required verbal cues to reach beyond arm length to facilitate reaching outside BOS. Static unsupported standing trials x5 ranging from 5-10sec each; pt reports feeling "unsteady." Moderate visible tremor of BUE with reaching and BLE in standing. Exercises for LE strengthening initiated as follows 2x15 each: seated marching, LAQ, seated ABD with level 1 theraband, bridges (2x10), and ADD squeezes with ball; handout provided with instructions and images. ModA supine>sitting EOB on mat table and minA stand pivot to w/c. Self-propelling w/c ~29ft with S/verbal cues and increased time. Pt remained in  w/c self-propelling with S/verbal cues and husband present at the end of the session. Husband instructed to help propel her back to room when she fatigued.  Therapy Documentation Precautions:  Precautions Precautions: Fall Restrictions Weight Bearing Restrictions: No   Therapy/Group: Individual Therapy  [name], SPT 04/19/2018, 11:08 AM

## 2018-04-19 NOTE — Progress Notes (Signed)
[place] PHYSICAL MEDICINE & REHABILITATION PROGRESS NOTE  Subjective/Complaints: Patient seen laying in bed this morning.  She states she slept well overnight.  She states she wants to go home.  Informed by nursing regarding hypoglycemia.  ROS: Denies CP, S OB, nausea, vomiting, diarrhea.  Objective: Vital Signs: Blood pressure (!) 141/65, pulse 82, temperature 98.2 F (36.8 C), resp. rate 18, height 5' 4.5" (1.638 m), weight 86.1 kg, last menstrual period 08/14/2010, SpO2 97 %. No results found. Recent Labs    04/19/18 0716  WBC 10.8*  HGB 10.5*  HCT 34.5*  PLT 216   Recent Labs    04/19/18 0716  NA 136  K 3.7  CL 102  CO2 25  GLUCOSE 117*  BUN 5*  CREATININE 0.68  CALCIUM 8.9    Physical Exam: BP (!) 141/65 (BP Location: Right Leg)   Pulse 82   Temp 98.2 F (36.8 C)   Resp 18   Ht 5' 4.5" (1.638 m)   Wt 86.1 kg   LMP 08/14/2010 (Exact Date)   SpO2 97%   BMI 32.08 kg/m  Constitutional: Well-developed.  NAD.  Obese. HENT: Normocephalic.  Atraumatic. Eyes: EOMI.  No discharge. Cardiovascular: RRR.  No JVD. Respiratory: Effort normal and breath sounds normal.  GI: She exhibits no distension.  Bowel sounds normal. Musculoskeletal: No edema or tenderness in extremities. Neurological:  Alert.  Makes good eye contact with examiner.  Follows full commands.  Some delays with processing  Motor: RUE/RLE 4--4+/5, stable.  LUE/LLE: 4+/5, stable Sensation intact light touch Skin: Skin is warm.  Intact. Psychiatric: Pleasant and cooperative   Assessment/Plan: 1. Functional deficits secondary to [facility] which require 3+ hours per day of interdisciplinary therapy in a comprehensive inpatient rehab setting.  Physiatrist is providing close team supervision and 24 hour management of active medical problems listed below.  Physiatrist and rehab team continue to assess barriers to discharge/monitor patient progress toward functional and medical goals  Care  Tool:  Bathing    Body parts bathed by patient: Right arm, Left arm, Chest, Abdomen, Front perineal area, Face, Right upper leg, Left upper leg, Right lower leg   Body parts bathed by helper: Buttocks     Bathing assist Assist Level: Minimal Assistance - Patient > 75%     Upper Body Dressing/Undressing Upper body dressing   What is the patient wearing?: Pull over shirt    Upper body assist Assist Level: Moderate Assistance - Patient 50 - 74%    Lower Body Dressing/Undressing Lower body dressing      What is the patient wearing?: Pants, Incontinence brief     Lower body assist Assist for lower body dressing: Maximal Assistance - Patient 25 - 49%     Toileting Toileting    Toileting assist Assist for toileting: Maximal Assistance - Patient 25 - 49%     Transfers Chair/bed transfer  Transfers assist  Chair/bed transfer activity did not occur: N/A  Chair/bed transfer assist level: Dependent - mechanical lift(stedy)     Locomotion Ambulation   Ambulation assist      Assist level: Moderate Assistance - Patient 50 - 74% Assistive device: Walker-rolling Max distance: 4   Walk 10 feet activity   Assist  Walk 10 feet activity did not occur: Safety/medical concerns        Walk 50 feet activity   Assist Walk 50 feet with 2 turns activity did not occur: Safety/medical concerns         Walk 150 feet  activity   Assist Walk 150 feet activity did not occur: Safety/medical concerns         Walk 10 feet on uneven surface  activity   Assist Walk 10 feet on uneven surfaces activity did not occur: Safety/medical concerns         Wheelchair     Assist   Type of Wheelchair: Manual    Wheelchair assist level: Minimal Assistance - Patient > 75% Max wheelchair distance: 45    Wheelchair 50 feet with 2 turns activity    Assist        Assist Level: Minimal Assistance - Patient > 75%   Wheelchair 150 feet activity     Assist  Wheelchair 150 feet activity did not occur: Safety/medical concerns          Medical Problem List and Plan:  1. Right side weakness, dysphagia secondary to aneurysmal bleed/SAH   Continue CIR 2. DVT Prophylaxis/Anticoagulation: Subcutaneous Lovenox. Monitor for any bleeding episodes  3. Pain Management: Tylenol as needed  4. Mood: Seroquel 25 mg nightly  5. Neuropsych: This patient is capable of making decisions on her own behalf.  6. Skin/Wound Care: Routine skin checks  7. Fluids/Electrolytes/Nutrition: Routine in and outs  BMP within acceptable range on 10/4 8. Stage IV breast cancer. Palliative chemotherapy completed. Follow-up Dr. Hinton Rao   Palliative care following patient  9. Dysphagia. Dysphasia #2 nectar liquids, advance to D2 thins  Continue to advance as tolerated 10. Hypertension. Norvasc 10 mg daily, Coreg 25 mg twice daily, labetalol 100 mg 3 times daily   Slightly elevated on 10/7 11. Seizure prophylaxis. Dilantin 100 mg 3 times daily, Keppra 750 mg twice daily  12.  Anemia of chronic disease  Hemoglobin 10.5 on 10/7  Continue to monitor 13.  Hyperglycemia  Will order HbA1c with next set of labs  Labile blood glucose with hypoglycemia,?  Machine error, improved with carbohydrate supplementation 14.  Transaminitis  LFTs elevated on 10/7  Continue to monitor 15.  Leukocytosis  Afebrile  WBCs 10.8 on 10/7  Continue to monitor  LOS: 3 days A FACE TO FACE EVALUATION WAS PERFORMED  Elyce Zollinger Lorie Phenix 04/19/2018, 8:37 AM

## 2018-04-19 NOTE — Progress Notes (Addendum)
Physical Therapy Session Note  Patient Details  Name: Diane Short MRN: [id number] Date of Birth: 07/16/1955  Today's Date: 04/19/2018 PT Individual Time: 1400-1500 PT Individual Time Calculation (min): 60 min   Short Term Goals: Week 1:  PT Short Term Goal 1 (Week 1): Pt will perform bed<>WC transfer with mod assist  PT Short Term Goal 2 (Week 1): Pt will ambulate 33ft with mod assist  PT Short Term Goal 3 (Week 1): Pt will propell WC 155ft with supervision assist  PT Short Term Goal 4 (Week 1): Pt will perform bed mobiltiy with supervision assist   Skilled Therapeutic Interventions/Progress Updates:    Pt received sidelying in bed attempting to take a nap, easily arouseable and agreeable to participate in therapy session. Pt is disoriented upon first waking up and believes yesterday to be Monday, easily redirected. Pt reports feeling significantly fatigued this PM but is agreeable to participate in therapy session. No complaints of pain. Supine to sit with min A. Stand pivot transfer bed to w/c with mod A for balance. Pt reports urge to urinate. Stand pivot transfer w/c to bedside commode with mod A, pt has incontinence of urine prior to underwear and pants being fully removed and before sitting on commode. Pt requires total A for clothing management and setup assist for pericare. Standing tolerance x 73 sec, x 100 sec, x 102 sec while completing card sorting task with alternating use of one UE while using other UE for support, min A for balance. Sit to stand x 5 reps from w/c to RW with min A, focus on safe transfer technique and UE placement during transfer. Squat pivot transfer w/c to bed with mod A. Sit to supine mod A for BLE management. Pt left supine in bed with needs in reach, bed alarm in place, husband present.  Therapy Documentation Precautions:  Precautions Precautions: Fall Restrictions Weight Bearing Restrictions: No  Therapy/Group: Individual Therapy  Excell Seltzer,  PT, DPT  04/19/2018, 3:27 PM

## 2018-04-19 NOTE — Care Management Note (Signed)
[facility] Individual Statement of Services  Patient Name:  Diane Short  Date:  04/19/2018  Welcome to the [facility].  Our goal is to provide you with an individualized program based on your diagnosis and situation, designed to meet your specific needs.  With this comprehensive rehabilitation program, you will be expected to participate in at least 3 hours of rehabilitation therapies Monday-Friday, with modified therapy programming on the weekends.  Your rehabilitation program will include the following services:  Physical Therapy (PT), Occupational Therapy (OT), Speech Therapy (ST), 24 hour per day rehabilitation nursing, Neuropsychology, Case Management (Social Worker), Rehabilitation Medicine, Nutrition Services and Pharmacy Services  Weekly team conferences will be held on Wednesday to discuss your progress.  Your Social Worker will talk with you frequently to get your input and to update you on team discussions.  Team conferences with you and your family in attendance may also be held.  Expected length of stay: supervision-some min assist  Overall anticipated outcome: 10-12 days  Depending on your progress and recovery, your program may change. Your Social Worker will coordinate services and will keep you informed of any changes. Your Social Worker's name and contact numbers are listed  below.  The following services may also be recommended but are not provided by the [facility] will be made to provide these services after discharge if needed.  Arrangements include referral to agencies that provide these services.  Your insurance has been verified to be:  Medicaid Your primary doctor is:  Radio producer  Pertinent information will be shared with your doctor and your insurance company.  Social Worker:   Ovidio Kin, [place] or [phone]  Information discussed with and copy given to patient by: Elease Hashimoto, 04/19/2018, 11:46 AM

## 2018-04-19 NOTE — Progress Notes (Signed)
Speech Language Pathology Daily Session Note  Patient Details  Name: Diane Short MRN: [id number] Date of Birth: 1956/02/05  Today's Date: 04/19/2018 SLP Individual Time: 1200-1230 SLP Individual Time Calculation (min): 30 min  Short Term Goals: Week 1: SLP Short Term Goal 1 (Week 1): Patient will consume current diet with minimal overt s/s of aspiration and Mod I for use of swallowing strategies.  SLP Short Term Goal 2 (Week 1): Patient will demonstrate efficient mastication and complete oral clearance with trials of Dys. 3 textures with supervision verbal cues prior to upgrade.  SLP Short Term Goal 3 (Week 1): Patient will demonstrate sustained attention to tasks for ~15 minutes with Min A verbal cues for redirection.  SLP Short Term Goal 4 (Week 1): Patient will demonstrate functional problem solving for basic and familiar tasks with Min A verbal cues.  SLP Short Term Goal 5 (Week 1): Patient will self-monitor and correct errors during functional tasks with Min A verbal cues.  SLP Short Term Goal 6 (Week 1): Patient will utilize external aids to recall new, daily information with Mod A verbal cues.   Skilled Therapeutic Interventions: Skilled treatment session focused on dysphagia goals and education. SLP facilitated session by providing skilled observation of pt consuming trial dysphagia 3 lunch tray with thin liquids via straw. Pt consumed thin liquids via straw without overt s/s of aspiration. Pt with slow pace and small bite size of dysphagia 3 items and supplemental items (fruit cup). Pt with complete oral clearing and appear appropriate for diet upgrade to dysphagia 3. Education provided to husband and pt as well as nursing on current diet and nursing to order liquid medicines in pill form. Pt left upright in wheelchair with husband present. Continue per current plan of care.      Pain Pain Assessment Pain Scale: 0-10 Pain Score: 0-No pain  Therapy/Group: Individual  Therapy  Diane Short 04/19/2018, 12:47 PM

## 2018-04-19 NOTE — Progress Notes (Signed)
Social Work  Social Work Assessment and Plan  Patient Details  Name: Diane Short MRN: [id number] Date of Birth: [birth year]  Today's Date: 04/19/2018  Problem List:  Patient Active Problem List   Diagnosis Date Noted  . Labile blood glucose   . Hypoglycemia   . Transaminitis   . Hyperglycemia   . Right hemiparesis ([place])   . Stage IV breast cancer in female [facility])   . Essential hypertension   . Seizure prophylaxis   . Anemia of chronic disease   . Dysphagia, oropharyngeal   . Pressure injury of skin 04/11/2018  . Goals of care, counseling/discussion   . Palliative care by specialist   . Adult failure to thrive   . Endotracheal tube present   . SAH (subarachnoid hemorrhage) ([place]) 03/22/2018  . Malignant neoplasm of left breast in female, estrogen receptor negative ([place]) 01/07/2018  . Brain metastasis ([place]) 01/07/2018  . Cerebral aneurysm, nonruptured 03/13/2017   Past Medical History:  Past Medical History:  Diagnosis Date  . 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 Anxiety   . Arthritis   . Cancer [facility])    breast cancer  . Cerebral hemorrhage ([place]) 1986  . Depression    due to cancer dx  . Diverticulosis   . GERD (gastroesophageal reflux disease)   . Headache   . History of hiatal hernia   . Hypertension   . 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 Neutropenic fever ([place])    Chemo related  . Osteonecrosis ([place]) 2017-2018   Right Jaw bone  . Thrush, oral    Chemo related   Past Surgical History:  Past Surgical History:  Procedure Laterality Date  . BREAST SURGERY Left 11/2016   metastic to bone  . CATARACT EXTRACTION Right 03/02/2017  . CEREBRAL ANGIOGRAM  1986  . CESAREAN SECTION  1980  . IR 3D INDEPENDENT WKST  01/20/2017  . IR 3D INDEPENDENT WKST  05/26/2017  . IR ANGIO INTRA EXTRACRAN SEL COM CAROTID INNOMINATE UNI R MOD SED  04/09/2017  . IR ANGIO INTRA EXTRACRAN SEL INTERNAL CAROTID BILAT MOD SED  01/20/2017  . IR ANGIO INTRA EXTRACRAN SEL INTERNAL CAROTID BILAT MOD  SED  11/17/2017  . IR ANGIO INTRA EXTRACRAN SEL INTERNAL CAROTID BILAT MOD SED  03/23/2018  . IR ANGIO INTRA EXTRACRAN SEL INTERNAL CAROTID UNI R MOD SED  03/13/2017  . IR ANGIO VERTEBRAL SEL VERTEBRAL BILAT MOD SED  01/20/2017  . IR ANGIO VERTEBRAL SEL VERTEBRAL UNI L MOD SED  05/26/2017  . IR ANGIO VERTEBRAL SEL VERTEBRAL UNI L MOD SED  11/17/2017  . IR ANGIOGRAM FOLLOW UP STUDY  03/13/2017  . IR ANGIOGRAM FOLLOW UP STUDY  03/13/2017  . IR ANGIOGRAM FOLLOW UP STUDY  03/13/2017  . IR ANGIOGRAM FOLLOW UP STUDY  03/13/2017  . IR ANGIOGRAM FOLLOW UP STUDY  03/13/2017  . IR ANGIOGRAM FOLLOW UP STUDY  03/13/2017  . IR ANGIOGRAM FOLLOW UP STUDY  03/13/2017  . IR ANGIOGRAM FOLLOW UP STUDY  03/13/2017  . IR ANGIOGRAM FOLLOW UP STUDY  03/13/2017  . IR ANGIOGRAM FOLLOW UP STUDY  03/13/2017  . IR ANGIOGRAM FOLLOW UP STUDY  03/13/2017  . IR ANGIOGRAM FOLLOW UP STUDY  03/13/2017  . IR ANGIOGRAM FOLLOW UP STUDY  04/09/2017  . IR ANGIOGRAM FOLLOW UP STUDY  05/26/2017  . IR ANGIOGRAM FOLLOW UP STUDY  05/26/2017  . IR ANGIOGRAM FOLLOW UP STUDY  03/23/2018  . IR NEURO EACH ADD'L  AFTER BASIC UNI LEFT (MS)  04/09/2017  . IR NEURO EACH ADD'L AFTER BASIC UNI LEFT (MS)  03/23/2018  . IR NEURO EACH ADD'L AFTER BASIC UNI RIGHT (MS)  03/13/2017  . IR TRANSCATH/EMBOLIZ  03/13/2017  . IR TRANSCATH/EMBOLIZ  04/09/2017  . IR TRANSCATH/EMBOLIZ  05/26/2017  . IR TRANSCATH/EMBOLIZ  03/23/2018  . IR US GUIDE VASC ACCESS RIGHT  04/09/2017  . MASTECTOMY Left   . port a cathh  04/2016  . RADIOLOGY WITH ANESTHESIA N/A 03/13/2017   Procedure: stent coiling of right MCA aneurysm;  Surgeon: Consuella Lose, MD;  Location: [place];  Service: Radiology;  Laterality: N/A;  . RADIOLOGY WITH ANESTHESIA N/A 04/09/2017   Procedure: Arteriogram, pipeline embolization of aneruysm;  Surgeon: Consuella Lose, MD;  Location: [place];  Service: Radiology;  Laterality: N/A;  . RADIOLOGY WITH ANESTHESIA N/A 05/26/2017   Procedure: Stent supported coil  embolization;  Surgeon: Consuella Lose, MD;  Location: [place];  Service: Radiology;  Laterality: N/A;  . RADIOLOGY WITH ANESTHESIA N/A 03/23/2018   Procedure: IR WITH ANESTHESIA;  Surgeon: Consuella Lose, MD;  Location: [place];  Service: Radiology;  Laterality: N/A;  . TUBAL LIGATION  1983   Social History:  reports that she has been smoking cigarettes. She has a 21.00 pack-year smoking history. She has never used smokeless tobacco. She reports that she does not drink alcohol or use drugs.  Family / Support Systems Marital Status: Married How Long?: 6 years Patient Roles: Spouse, Parent, Other (Comment)(retiree) Spouse/Significant Other: [name] [phone]  [phone] Children: Willene Hatchet [phone] Other Supports: Friends and church members Anticipated Caregiver: [name] has been checked off with transfers and daughter works evenings but is local Ability/Limitations of Caregiver: Husband is transferring pt and doing well with her-he can provide min assist level Caregiver Availability: 24/7 Family Dynamics: Close knit family pt and husband have been married for 6 years and feel like newlyweds still. They take care of each other. Husband was in [place] in 2014 and pt cared for him. Husband is very attentive to pt and her needs.  Social History Preferred language: English Religion: Christian Cultural Background: No issues Education: High School Read: Yes Write: Yes Employment Status: Retired Freight forwarder Issues: No issues Guardian/Conservator: None-according to MD pt is capable of making her own decisions while here, husband is here daily and assisting with her care   Abuse/Neglect Abuse/Neglect Assessment Can Be Completed: Yes Physical Abuse: Denies Verbal Abuse: Denies Sexual Abuse: Denies Exploitation of patient/patient's resources: Denies Self-Neglect: Denies  Emotional Status Pt's affect, behavior adn adjustment status: Pt is  motivatd to get home and out of the hospital. Husband is learning her care along the way. Pt is doing remarkable well considering where she started off from. She will work hard in therapies to get home sooner. Husband will be here during therapies to learn and assist wife. Recent Psychosocial Issues: other health issues-breast cancer and treatments Pyschiatric History: History fo axniety/depression takes medications for this and finds them helpful. She may benefit from seeing neuro-psych while here. Will make referral. Substance Abuse History: No issues  Patient / Family Perceptions, Expectations & Goals Pt/Family understanding of illness & functional limitations: Pt and husband can explain her condition and treatments up until now. Pt has done very well and progressed quickly in her therapies. Husband is a strong support of her. They talk with the MD and feel they have a good understanding of her treatment going forward. Premorbid pt/family roles/activities: Wife, mother, retiree, freind, Heritage manager  member Anticipated changes in roles/activities/participation: resume Pt/family expectations/goals: Pt states: " I want to get home soon."  Husband states: " I will assist her and have been since being hospitalized, she is doing great."  [employer]: Other (Comment)(Pt at [facility]) Premorbid Home Care/DME Agencies: Other (Comment)(has all DME and has had HH in the past) Transportation available at discharge: Husband and daughter Resource referrals recommended: Neuropsychology, Support group (specify)  Discharge Planning Living Arrangements: Spouse/significant other Support Systems: Spouse/significant other, Children, Friends/neighbors, Church/faith community Type of Residence: Private residence Insurance Resources: [employer] (specify county) Pensions consultant: SSD, Family Support Financial Screen Referred: No Living Expenses: Education officer, community Management: Patient,  Spouse Does the patient have any problems obtaining your medications?: No Home Management: Both mostly husband now due to pt's weakness Patient/Family Preliminary Plans: Return home with husband who is here daily and learning her care. Both are very supportive of one another. Both hope to be here a short time and be going home soon. Therapy team feels is doing well and will be a short length of stay.. Social Work Anticipated Follow Up Needs: HH/OP, Support Group  Clinical Impression Pleasant couple who are very supportive of one another and husband cheers pt on. Both want her to be here a short time and be home soon. Pt is doing well and working hard in her therapies. Do feel she would benefit from seeing neuro-psych while here, will make referral. Work on discharge needs.  Elease Hashimoto 04/19/2018, 12:03 PM

## 2018-04-20 ENCOUNTER — Inpatient Hospital Stay (HOSPITAL_COMMUNITY): Payer: Medicaid Other

## 2018-04-20 ENCOUNTER — Inpatient Hospital Stay (HOSPITAL_COMMUNITY): Payer: Medicaid Other | Admitting: Physical Therapy

## 2018-04-20 ENCOUNTER — Inpatient Hospital Stay (HOSPITAL_COMMUNITY): Payer: Medicaid Other | Admitting: Occupational Therapy

## 2018-04-20 DIAGNOSIS — R7303 Prediabetes: Secondary | ICD-10-CM

## 2018-04-20 DIAGNOSIS — R0989 Other specified symptoms and signs involving the circulatory and respiratory systems: Secondary | ICD-10-CM

## 2018-04-20 LAB — GLUCOSE, CAPILLARY
GLUCOSE-CAPILLARY: 102 mg/dL — AB (ref 70–99)
GLUCOSE-CAPILLARY: 108 mg/dL — AB (ref 70–99)
GLUCOSE-CAPILLARY: 117 mg/dL — AB (ref 70–99)
Glucose-Capillary: 122 mg/dL — ABNORMAL HIGH (ref 70–99)

## 2018-04-20 NOTE — Progress Notes (Signed)
Occupational Therapy Session Note  Patient Details  Name: Diane Short MRN: [id number] Date of Birth: 1955/07/17  Today's Date: 04/20/2018 OT Individual Time: 1100-1200 OT Individual Time Calculation (min): 60 min    Short Term Goals: Week 1:  OT Short Term Goal 1 (Week 1): Pt will don shirt wiht min A OT Short Term Goal 2 (Week 1): Pt will transfer to toilet/BSC wiht MOD A of 1 caregiver with LRAD OT Short Term Goal 3 (Week 1): Pt will don pants with min A OT Short Term Goal 4 (Week 1): Pt will don footwear with min A and AE PRN  Skilled Therapeutic Interventions/Progress Updates:    Pt supine in bed upon with no reports of pain however reporting fatigue- husband present for session. Pt performed functional transfers this session with RW and close supervision-min A dependent on surface height. Pt ambulated throughout room with RW and performed toileting tasks- see below- rest breaks required throughout with cues for breath support strategies. Pt transported to ADL apartment total A for energy conservation. Therapist demonstrated TTB transfer with pt able to return demonstrate with close supervision and max VC'ing due to pt attempting to sit prior to obtaining safe position. Education required regarding body mechanics, level of assistance needed, and transfer methods to husband- pt's husband attempting to lift pt unsafely and reporting "we've figured out this method works" once education provided husband with verbal comprehension and agreeable to perform transfer simulating therapist.   Discussed with pt and husband regarding rollator options due to pt's poor activity tolerance and increased fatigue with exertion. Pt trialed bariatric and standard rollator options- pt with increased ability to navigate standard rollator, able to operate all features of rollator and perform pivot to sit on seat with max VC's for completing full rotation prior to sitting. Ongoing education and home mobility  practice needed with rollator to ensure safe d/c. PT made aware of rollator option for home. Pt returned to room total A in w/c due to fatigue. Pt left in room with chair alarm set and husband present.   Therapy Documentation Precautions:  Precautions Precautions: Fall Restrictions Weight Bearing Restrictions: No ADL: ADL Toileting: Supervision/safety Where Assessed-T/oileting: Toilet(BSC over toilet) Toilet Transfer: Close supervision, Minimal verbal cueing Toilet Transfer Method: Ambulating Toilet Transfer Equipment: Other (comment)(BSC over toilet) Tub/Shower Transfer: Close supervison, Moderate cueing Tub/Shower Transfer Method: Ambulating Tub/Shower Equipment: Transfer tub bench    Therapy/Group: Individual Therapy  Sharita Bienaime 04/20/2018, 12:56 PM

## 2018-04-20 NOTE — Progress Notes (Addendum)
Speech Language Pathology Daily Session Note  Patient Details  Name: Diane Short MRN: [id number] Date of Birth: 10-Jan-1956  Today's Date: 04/20/2018 SLP Individual Time: 1220-1250 [phone] SLP Individual Time Calculation (min): 30 min and 30 min  Short Term Goals: Week 1: SLP Short Term Goal 1 (Week 1): Patient will consume current diet with minimal overt s/s of aspiration and Mod I for use of swallowing strategies.  SLP Short Term Goal 2 (Week 1): Patient will demonstrate efficient mastication and complete oral clearance with trials of Dys. 3 textures with supervision verbal cues prior to upgrade.  SLP Short Term Goal 3 (Week 1): Patient will demonstrate sustained attention to tasks for ~15 minutes with Min A verbal cues for redirection.  SLP Short Term Goal 4 (Week 1): Patient will demonstrate functional problem solving for basic and familiar tasks with Min A verbal cues.  SLP Short Term Goal 5 (Week 1): Patient will self-monitor and correct errors during functional tasks with Min A verbal cues.  SLP Short Term Goal 6 (Week 1): Patient will utilize external aids to recall new, daily information with Mod A verbal cues.   Skilled Therapeutic Interventions: 1# Skilled ST services focused on cognitive skills. SLP facilitated error awareness and basic problem solving with PEG design task, pt required supervision A verbal cues and given semi-complex design required max-mod A verbal cues. Pt demonstrated sustained attention in 30 minute intervals with supervision A verbal cues. Pt was left in room with call bell within reach and bed alarm   set. SLP reccomends to continue skilled services.  2# Skilled ST services focused on swallow skills. SLP facilitated recall of pervious therapy events (ST and OT) , pt required mod-min A verbal cues. SLP facilitated PO consumption of dys 3 and thin liquid lunch tray demonstrated slight prolonged mastication, however swallow function appeared functional with  no s/s aspiration, set up assist only. Pt was left in room with call bell within reach and bed alarm set. SLP reccomends to continue skilled services.     Function:  Eating Eating                 Cognition Comprehension    Expression      Social Interaction    Problem Solving    Memory      Pain Pain Assessment Pain Score: 0-No pain  Therapy/Group: Individual Therapy  Diane Short 04/20/2018, 1:10 PM

## 2018-04-20 NOTE — Progress Notes (Signed)
Physical Therapy Session Note  Patient Details  Name: Diane Short MRN: [id number] Date of Birth: February 20, 1956  Today's Date: 04/20/2018 PT Individual Time: 0900-1000 PT Individual Time Calculation (min): 60 min   Short Term Goals: Week 1:  PT Short Term Goal 1 (Week 1): Pt will perform bed<>WC transfer with mod assist  PT Short Term Goal 2 (Week 1): Pt will ambulate 25ft with mod assist  PT Short Term Goal 3 (Week 1): Pt will propell WC 163ft with supervision assist  PT Short Term Goal 4 (Week 1): Pt will perform bed mobiltiy with supervision assist   Skilled Therapeutic Interventions/Progress Updates:    Pt received sitting in w/c with husband present; denies pain and agreeable to PT treatment. TotalA w/c transport to rehab gym for energy/time conservation. MinA/CGA sit>stand and CGA gait 43ft x2 trials with RW to enhanve endurance; pt able to ambulate with quicker gait speed compared to yesterday, but still slow. CGA stair negotiation on 4 inch steps with BUE support for LE strengthening, increasing activity tolerance, and enhancing endurance; trial 1: 5 steps, trial 2: 8 steps with seated rest break between the two trials; ascended with reciprocal pattern, descending with step-to pattern and decreased eccentric control bilaterally. Attempted to perform balance training with wii, but batteries were dead. Static standing without UE support x2, increasing time from first trial (30sec) to second trial (45 sec). 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 The pt's nurse and PA were informed of pt complaint/concern with this issue. Pt returned to room with totalA w/c transport; CGA stand pivot transfer to bed and S bed mobility. Pt remained in bed with husband and nurse present with all needs in reach.  Therapy Documentation Precautions:  Precautions Precautions:  Fall Restrictions Weight Bearing Restrictions: No   Therapy/Group: Individual Therapy  [place] Estie Sproule, SPT 04/20/2018, 11:31 AM

## 2018-04-20 NOTE — Progress Notes (Signed)
Physical Therapy Session Note  Patient Details  Name: Diane Short MRN: [id number] Date of Birth: November 06, 1955  Today's Date: 04/20/2018 PT Individual Time: 1550-1607 PT Individual Time Calculation (min): 17 min   Short Term Goals: Week 1:  PT Short Term Goal 1 (Week 1): Pt will perform bed<>WC transfer with mod assist  PT Short Term Goal 2 (Week 1): Pt will ambulate 38f with mod assist  PT Short Term Goal 3 (Week 1): Pt will propell WC 1077fwith supervision assist  PT Short Term Goal 4 (Week 1): Pt will perform bed mobiltiy with supervision assist   Skilled Therapeutic Interventions/Progress Updates:   Pt asleep in supine, easily woken up. Agreeable to therapy w/ encouragement, reports increased fatigue and headache this afternoon. Worked on gait w/ rollator w/ addition of 9# weight to keep the rollator from getting too far in front of her. Pt ambulating w/ appropriate gait speed using rollater w/ max verbal and tactile cues for brake management and turning to sit safely. CGA to ambulate 25' x2 w/ seated rest break in between on seat of rollator. Pt w/ increased fatigue/lethargy. Returned to room and ended session in supine, in care of husband and all needs met. Missed 13 min of skilled PT 2/2 fatigue.   Therapy Documentation Precautions:  Precautions Precautions: Fall Restrictions Weight Bearing Restrictions: No General: PT Amount of Missed Time (min): 13 Minutes PT Missed Treatment Reason: Patient fatigue Vital Signs: Therapy Vitals Temp: 99.1 F (37.3 C) Temp Source: Oral Pulse Rate: 84 Resp: 18 BP: (!) 153/56 Patient Position (if appropriate): Lying Oxygen Therapy SpO2: 100 % O2 Device: Room Air Pain: Pain Assessment Pain Scale: 0-10 Pain Score: 6  Pain Type: Acute pain Pain Location: Head Pain Orientation: Anterior Pain Descriptors / Indicators: Aching Pain Frequency: Occasional Pain Onset: Gradual Patients Stated Pain Goal: 2 Pain Intervention(s):  Medication (See eMAR) Multiple Pain Sites: No  Therapy/Group: Individual Therapy  Milanna Kozlov K Bonny Egger 04/20/2018, 4:08 PM

## 2018-04-20 NOTE — Progress Notes (Signed)
[facility] PHYSICAL MEDICINE & REHABILITATION PROGRESS NOTE  Subjective/Complaints: Patient seen laying in bed this morning.  He states he slept well overnight.  She denies complaints.  ROS: Denies CP, S OB, nausea, vomiting, diarrhea.  Objective: Vital Signs: Blood pressure 138/69, pulse 89, temperature 98.2 F (36.8 C), temperature source Oral, resp. rate 18, height 5' 4.5" (1.638 m), weight 86.2 kg, last menstrual period 08/14/2010, SpO2 98 %. No results found. Recent Labs    04/19/18 0716  WBC 10.8*  HGB 10.5*  HCT 34.5*  PLT 216   Recent Labs    04/19/18 0716  NA 136  K 3.7  CL 102  CO2 25  GLUCOSE 117*  BUN 5*  CREATININE 0.68  CALCIUM 8.9    Physical Exam: BP 138/69 (BP Location: Right Leg)   Pulse 89   Temp 98.2 F (36.8 C) (Oral)   Resp 18   Ht 5' 4.5" (1.638 m)   Wt 86.2 kg   LMP 08/14/2010 (Exact Date)   SpO2 98%   BMI 32.12 kg/m  Constitutional: Well-developed.  NAD.  Obese. HENT: Normocephalic.  Atraumatic. Eyes: EOMI.  No discharge. Cardiovascular: RRR.  No JVD. Respiratory: Effort normal and breath sounds normal.  GI: She exhibits no distension.  Bowel sounds normal. Musculoskeletal: No edema or tenderness in extremities. Neurological:  Alert.  Makes good eye contact with examiner.  Follows full commands.  Some delays with processing  Motor: RUE/RLE 4--4+/5, unchanged.  LUE/LLE: 4+/5, unchanged Skin: Skin is warm.  Intact. Psychiatric: Pleasant and cooperative   Assessment/Plan: 1. Functional deficits secondary to [facility] which require 3+ hours per day of interdisciplinary therapy in a comprehensive inpatient rehab setting.  Physiatrist is providing close team supervision and 24 hour management of active medical problems listed below.  Physiatrist and rehab team continue to assess barriers to discharge/monitor patient progress toward functional and medical goals  Care Tool:  Bathing    Body parts bathed by patient: Right arm, Left  arm, Chest, Abdomen, Front perineal area, Face, Right upper leg, Left upper leg, Right lower leg, Left lower leg, Buttocks   Body parts bathed by helper: Buttocks     Bathing assist Assist Level: Supervision/Verbal cueing     Upper Body Dressing/Undressing Upper body dressing   What is the patient wearing?: Pull over shirt    Upper body assist Assist Level: Set up assist    Lower Body Dressing/Undressing Lower body dressing      What is the patient wearing?: Underwear/pull up, Pants     Lower body assist Assist for lower body dressing: Moderate Assistance - Patient 50 - 74%     Toileting Toileting    Toileting assist Assist for toileting: Maximal Assistance - Patient 25 - 49%     Transfers Chair/bed transfer  Transfers assist  Chair/bed transfer activity did not occur: N/A  Chair/bed transfer assist level: Moderate Assistance - Patient 50 - 74%     Locomotion Ambulation   Ambulation assist      Assist level: Minimal Assistance - Patient > 75% Assistive device: Hand held assist(1 hall rail and w/c follow) Max distance: 30 ft   Walk 10 feet activity   Assist  Walk 10 feet activity did not occur: Safety/medical concerns  Assist level: Minimal Assistance - Patient > 75% Assistive device: Hand held assist(1 hall rail and w/c follow)   Walk 50 feet activity   Assist Walk 50 feet with 2 turns activity did not occur: Safety/medical concerns  Walk 150 feet activity   Assist Walk 150 feet activity did not occur: Safety/medical concerns         Walk 10 feet on uneven surface  activity   Assist Walk 10 feet on uneven surfaces activity did not occur: Safety/medical concerns         Wheelchair     Assist   Type of Wheelchair: Manual    Wheelchair assist level: Supervision/Verbal cueing Max wheelchair distance: 10 ft    Wheelchair 50 feet with 2 turns activity    Assist    Wheelchair 50 feet with 2 turns activity did  not occur: Safety/medical concerns   Assist Level: Minimal Assistance - Patient > 75%   Wheelchair 150 feet activity     Assist Wheelchair 150 feet activity did not occur: Safety/medical concerns          Medical Problem List and Plan:  1. Right side weakness, dysphagia secondary to aneurysmal bleed/SAH   Continue CIR 2. DVT Prophylaxis/Anticoagulation: Subcutaneous Lovenox. Monitor for any bleeding episodes  3. Pain Management: Tylenol as needed  4. Mood: Seroquel 25 mg nightly  5. Neuropsych: This patient is capable of making decisions on her own behalf.  6. Skin/Wound Care: Routine skin checks  7. Fluids/Electrolytes/Nutrition: Routine in and outs  BMP within acceptable range on 10/4 8. Stage IV breast cancer. Palliative chemotherapy completed. Follow-up Dr. Hinton Rao   Palliative care following patient  9. Dysphagia. Dysphasia #2 nectar liquids, advance to D2 thins  Continue to advance as tolerated 10. Hypertension. Norvasc 10 mg daily, Coreg 25 mg twice daily, labetalol 100 mg 3 times daily   Labile on 10/8 11. Seizure prophylaxis. Dilantin 100 mg 3 times daily, Keppra 750 mg twice daily  12.  Anemia of chronic disease  Hemoglobin 10.5 on 10/7  Continue to monitor 13.  Borderline prediabetes  Hemoglobin A1c 5.7 on 10/7  Relatively controlled on 10/8 14.  Transaminitis  LFTs elevated on 10/7  Continue to monitor 15.  Leukocytosis  Afebrile  WBCs 10.8 on 10/7  Continue to monitor  LOS: 4 days A FACE TO FACE EVALUATION WAS PERFORMED  Gavino Fouch Lorie Phenix 04/20/2018, 8:15 AM

## 2018-04-21 ENCOUNTER — Encounter (HOSPITAL_COMMUNITY): Payer: Medicaid Other | Admitting: Psychology

## 2018-04-21 ENCOUNTER — Inpatient Hospital Stay (HOSPITAL_COMMUNITY): Payer: Medicaid Other

## 2018-04-21 ENCOUNTER — Inpatient Hospital Stay (HOSPITAL_COMMUNITY): Payer: Medicaid Other | Admitting: Occupational Therapy

## 2018-04-21 ENCOUNTER — Inpatient Hospital Stay (HOSPITAL_COMMUNITY): Payer: Medicaid Other | Admitting: Physical Therapy

## 2018-04-21 LAB — GLUCOSE, CAPILLARY
GLUCOSE-CAPILLARY: 103 mg/dL — AB (ref 70–99)
GLUCOSE-CAPILLARY: 94 mg/dL (ref 70–99)
GLUCOSE-CAPILLARY: 103 mg/dL — AB (ref 70–99)
Glucose-Capillary: 109 mg/dL — ABNORMAL HIGH (ref 70–99)

## 2018-04-21 NOTE — Discharge Summary (Addendum)
NAME: Diane Short, HIPP MEDICAL RECORD [id number] ACCOUNT [id number] DATE OF BIRTH:November 03, 1955 FACILITY: [facility] LOCATION: [facility] PHYSICIAN:Darcia Lampi, MD  DISCHARGE SUMMARY  DATE OF DISCHARGE:  04/22/2018  DISCHARGE DIAGNOSES:  1.  Aneurysmal bleed with subarachnoid hemorrhage. 2.  Subcutaneous Lovenox for deep venous thrombosis prophylaxis. 3.  Stage IV breast cancer. 4.  Dysphagia.   5.  Hypertension. 6.  Seizure prophylaxis.   7.  Anemia of chronic disease. 8.  Borderline prediabetes.  HISTORY OF PRESENT ILLNESS:  This is a 62 year old right-handed female, married, reportedly independent until a few weeks ago complicated by medical history of progressive stage IV breast cancer followed by Dr. Hinton Rao with current completion of second  cycle of chemotherapy for palliative management.  The patient recently had radiation stereotactic surgery for single solitary brain met as well as history of 5 intracerebral aneurysms, 3 of which is treated endovascularly.  Noted progressive decline over  the last few weeks, intermittent headaches, seen by EMS 03/22/2018 taken at [facility] for unresponsive episode required intubation.  CT of the head showed subarachnoid hemorrhage, subsequently transferred to [facility] for further  treatment.  EEG showed no seizure.  Echocardiogram with ejection fraction of 76%, grade I diastolic dysfunction.  CT angiogram of head and neck showed increased volume of subarachnoid hemorrhage, predominantly located in the basal cisterns and sylvian  fissures with intraventricular extension in a pattern most consistent with aneurysmal hemorrhage.  Underwent cerebral angiogram with coil embolization left PICA per Dr. Kathyrn Sheriff.  Remained intubated until 04/03/2018.  Continued on Dilantin and Keppra  for seizure prophylaxis.  Nimotop for blood pressure control completed.  Subcutaneous Lovenox for DVT prophylaxis initiated, 03/31/2018.  Dysphagia #2 nectar  thick liquid diet.  The patient was admitted for a comprehensive rehabilitation program.  PAST MEDICAL HISTORY:  See discharge diagnoses.  SOCIAL HISTORY:  Lives with spouse.  Reported to be independent until a few weeks ago.  FUNCTIONAL  HISTORY:  Upon admission to rehab services was +2 physical assist supine to sit, +2 physical assist sit to stand, total assist squat pivot transfers, max assist with activities of daily living.  PHYSICAL EXAMINATION: VITAL SIGNS:  Blood pressure 159/87, pulse 94, temperature 98, respirations 20. GENERAL:  Alert female, pleasant, in no acute distress. HEENT:  EOMs intact. NECK:  Supple, nontender, no JVD. CARDIOVASCULAR:  Rate controlled. ABDOMEN:  Soft, nontender, good bowel sounds. LUNGS:  Clear to auscultation without wheeze. ABDOMEN:  Soft, nontender, good bowel sounds.  Right upper extremity 3- 4/5, left upper extremity 4/5, right lower extremity 3-4/5, left lower extremity 4-/5.  REHABILITATION HOSPITAL COURSE:  The patient was admitted to inpatient rehabilitation services.  Therapies initiated on a 3-hour daily basis, consisting of physical therapy, occupational therapy, speech therapy and rehabilitation nursing.  The following  issues were addressed during patient's rehabilitation stay:    Pertaining to the patient's aneurysmal bleed, SAH coiling embolization, she would follow up with neurosurgery.  Subcutaneous Lovenox for DVT prophylaxis during hospital course.  No bleeding episodes.    Mood stabilization with Seroquel.    Stage IV breast cancer, palliative chemotherapy completed.  Follow up with Dr. Hinton Rao.  Palliative care had been following.    Diet advanced to a mechanical soft.    Blood pressure is controlled with Norvasc, Coreg, labetalol.  She would follow up with her primary MD.  Seizure prophylaxis with Dilantin and Keppra.  No seizure activity.  Chronic anemia 10.5.  No bleeding episodes.  Borderline prediabetic 5.7.  Blood sugars  acceptable.  The patient received weekly collaborative interdisciplinary team conferences to  discuss estimated length of stay, family teaching, any barriers to discharge.  She required contact guard assist to ambulate 25 feet x2 with seated rest breaks in-between.  Working with energy conservation.  Needed very minimal cues.  Activities of daily  living and homemaking performed functional transfers, rolling walker, close supervision.  Ambulated throughout the room with rolling walker performing toileting tasks.  Full family teaching was completed and planned discharge to home.  DISCHARGE MEDICATIONS:  Included Norvasc 10 mg p.o. daily, Coreg 25 mg p.o. b.i.d., vitamin D 800 units daily, Pepcid 20 mg p.o. daily, labetalol 100 mg p.o. t.i.d., Keppra 750 mg p.o. b.i.d., Imodium 2 mg every 6 hours as needed, Dilantin 300 mg p.o. at  bedtime, Seroquel 25 mg p.o. at bedtime.    DIET:  Her diet was mechanical soft.    FOLLOWUP:  She would follow up with Dr. Delice Lesch at the outpatient rehab service office as directed; Dr. Kary Kos, call for appointment.  Hosie Poisson, oncology services.  Dr. Reed Pandy medical management.  Patient seen and examined by me on day of discharge.   AN/NUANCE D:04/21/2018 T:04/21/2018 JOB:[id number]

## 2018-04-21 NOTE — Progress Notes (Signed)
Physical Therapy Discharge Summary  Patient Details  Name: Diane Short MRN: [id number] Date of Birth: 14-Apr-1956  Today's Date: 04/21/2018 PT Individual Time: 1300-1400 PT Individual Time Calculation (min): 60 min    Patient has met 10 of 10 long term goals due to improved activity tolerance, improved balance, improved postural control, increased strength, ability to compensate for deficits, functional use of  right upper extremity and right lower extremity, improved attention, improved awareness and improved coordination.  Patient to discharge at an ambulatory level [place].   Patient's care partner is independent to provide the necessary physical and cognitive assistance at discharge. Goals initially set at supervision, however both pt and husband requested d/c earlier than anticipated as husband reports and demonstrates he is able to provide the physical assist needed for safe mobility within the home and community.   Reasons goals not met: All goals met  Recommendation:  Patient will benefit from ongoing skilled PT services in home health setting to continue to advance safe functional mobility, address ongoing impairments in strength, endurance, balance, and minimize fall risk.  Equipment: rollator  Reasons for discharge: treatment goals met and discharge from hospital  Patient/family agrees with progress made and goals achieved: Yes  PT Discharge Precautions/Restrictions Precautions Precautions: Fall Restrictions Weight Bearing Restrictions: No Vision/Perception  Perception Perception: Within Functional Limits Praxis Praxis: Intact  Cognition Overall Cognitive Status: Impaired/Different from baseline Orientation Level: Oriented X4 Focused Attention: Appears intact Sustained Attention: Impaired Selective Attention: Appears intact Memory: Appears intact Awareness: Impaired Awareness Impairment: Anticipatory impairment Safety/Judgment: Impaired Comments: Pt  requires VC's for self care skills and safety with rollator  Sensation Sensation Light Touch: Appears Intact Proprioception: Appears Intact Coordination Gross Motor Movements are Fluid and Coordinated: No Fine Motor Movements are Fluid and Coordinated: No Coordination and Movement Description: Pt has tremors during movement and at rest- pt reports tremors at baseline  Heel Shin Test: decreased speed/excursion Motor  Motor Motor: Hemiplegia Motor - Discharge Observations: mild R hemiparesis; tremors impacting motor movements   Mobility Bed Mobility Bed Mobility: Supine to Sit;Sit to Supine Supine to Sit: Supervision/Verbal cueing Sit to Supine: Supervision/Verbal cueing Transfers Transfers: Sit to Stand;Stand Pivot Transfers Sit to Stand: Supervision/Verbal cueing Stand to Sit: Supervision/Verbal cueing Stand Pivot Transfers: Supervision/Verbal cueing Transfer (Assistive device): Rollator Locomotion  Gait Ambulation: Yes Gait Assistance: Contact Guard/Touching assist Gait Distance (Feet): 84 Feet Assistive device: Rollator Gait Assistance Details: Verbal cues for technique;Verbal cues for precautions/safety Gait Gait: Yes Gait Pattern: Impaired Gait Pattern: Decreased stride length;Poor foot clearance - left;Poor foot clearance - right;Trunk flexed;Decreased trunk rotation;Lateral hip instability Stairs / Additional Locomotion Stairs: Yes Stairs Assistance: Contact Guard/Touching assist Stair Management Technique: Two rails;Step to pattern;Forwards Number of Stairs: 4 Height of Stairs: 6 Ramp: Contact Guard/touching assist Curb: Nurse, mental health Mobility: Yes Wheelchair Assistance: Chartered loss adjuster: Both upper extremities Wheelchair Parts Management: Needs assistance Distance: 100  Trunk/Postural Assessment  Cervical Assessment Cervical Assessment: Within Functional Limits Thoracic  Assessment Thoracic Assessment: Exceptions to WFL(increased kyphosis, rounded shoulders) Lumbar Assessment Lumbar Assessment: Exceptions to WFL(reduced lumbar lordosis) Postural Control Postural Control: Within Functional Limits  Balance Balance Balance Assessed: Yes Static Sitting Balance Static Sitting - Balance Support: Feet supported Static Sitting - Level of Assistance: 6: Modified independent (Device/Increase time) Dynamic Sitting Balance Dynamic Sitting - Balance Support: Feet supported Dynamic Sitting - Level of Assistance: 6: Modified independent (Device/Increase time) Dynamic Sitting - Balance Activities: Forward lean/weight shifting;Lateral lean/weight shifting Sitting balance - Comments:  sitting EOB Static Standing Balance Static Standing - Balance Support: During functional activity Static Standing - Level of Assistance: 5: Stand by assistance Dynamic Standing Balance Dynamic Standing - Balance Support: During functional activity;Bilateral upper extremity supported Dynamic Standing - Level of Assistance: 5: Stand by assistance Dynamic Standing - Balance Activities: Lateral lean/weight shifting;Forward lean/weight shifting;Reaching for objects;Reaching across midline Extremity Assessment  RUE Assessment RUE Assessment: Within Functional Limits Active Range of Motion (AROM) Comments: Full ROM of shoulder, elbow, wrist  General Strength Comments: Shoulder, elbow, wrist 4/5 LUE Assessment LUE Assessment: Within Functional Limits Active Range of Motion (AROM) Comments: Full ROM of shoulder, elbow, wrist  General Strength Comments: Shoulder, elbow, wrist 4/5 RLE Assessment RLE Assessment: Exceptions to [facility] Active Range of Motion (AROM) Comments: grossly WFL General Strength Comments: grossly 4/5 LLE Assessment LLE Assessment: Exceptions to [facility] General Strength Comments: grossly 4/5  Skilled Therapeutic Intervention: Pt received in bed, husband present; denies pain and  agreeable to treatment. Reviewed discharge recommendations, use of rollator and CGA for gait and stairs for home entry. Assessed mobility as above with min guard/S overall with rollator. Instructed pt in LE strengthening HEP and provided handout for performance at home with ongoing assessment/progression by [facility] PT. Pt and husband with no further questions regarding d/c home at this time; remained seated in recliner at end of session, all needs in reach.    Diane Short 04/21/2018, 2:30 PM

## 2018-04-21 NOTE — Progress Notes (Signed)
Occupational Therapy Session Note  Patient Details  Name: Stephenie Navejas MRN: [id number] Date of Birth: 1956-03-20  Today's Date: 04/21/2018 OT Individual Time: 1000-1055 OT Individual Time Calculation (min): 55 min    Short Term Goals: Week 1:  OT Short Term Goal 1 (Week 1): Pt will don shirt wiht min A OT Short Term Goal 2 (Week 1): Pt will transfer to toilet/BSC wiht MOD A of 1 caregiver with LRAD OT Short Term Goal 3 (Week 1): Pt will don pants with min A OT Short Term Goal 4 (Week 1): Pt will don footwear with min A and AE PRN  Skilled Therapeutic Interventions/Progress Updates:    Pt sitting in recliner upon entry with no reports of pain and husband present. Pt performed functional transfers and ambulation this session with close supervision and min VC's at times for locking brakes of rollator. Pt with multiple trials each transfer with reports of "it just takes me a couple of tries" however pt able to complete transfer each time with min VC's for momentum and leaning forward. Pt ambulated to shower and performed all aspects of bathing while seated on TTB with lateral leans performed for hygiene. Pt with reports of "feeling safe" when performing all bathing tasks while seated. Pt then ambulated to EOB and performed dressing tasks while seated- multiple rest breaks required due to poor endurance and generalized weakness however pt with improvements in ability to maintain balance while performing clothing mgmt in standing. Pt then ambulated to sink and performed grooming tasks with education provided by therapist for rollator positioning at sink and energy conservation with rest breaks on rollator seat PRN during grooming tasks at home. Pt performed all rollator mgmt of brakes and positioning in simulation of sitting at home during grooming tasks with supervision. Pt left supine in bed with all needs in reach, husband present, with reports of taking nap before PT session.   Husband provided  less assistance this session with verbal reports and understanding of importance of allowing pt opportunity to perform tasks and maximize independence.  Therapy Documentation Precautions:  Precautions Precautions: Fall Restrictions Weight Bearing Restrictions: No ADL: ADL Grooming: Supervision/safety Where Assessed-Grooming: Standing at sink Upper Body Bathing: Supervision/safety Where Assessed-Upper Body Bathing: Shower Lower Body Bathing: Supervision/safety Where Assessed-Lower Body Bathing: Shower Upper Body Dressing: Setup Where Assessed-Upper Body Dressing: Edge of bed Lower Body Dressing: Supervision/safety Where Assessed-Lower Body Dressing: Edge of bed Toileting: Supervision/safety Where Assessed-Toileting: Glass blower/designer: Close supervision, Minimal verbal cueing Toilet Transfer Method: Counselling psychologist: Grab bars, Other (comment)(BSC over toilet ) Tub/Shower Transfer: Close supervison, Moderate cueing Tub/Shower Transfer Method: Optometrist: Facilities manager: Close supervision Social research officer, government Method: Heritage manager: Radio broadcast assistant, Grab bars   Therapy/Group: Individual Therapy  Leontyne Manville 04/21/2018, 7:06 AM

## 2018-04-21 NOTE — Consult Note (Signed)
Neuropsychological Consultation   Patient:   Diane Short   DOB:   07-07-1956  MR Number:  [id number]  Location:  [facility] [address] [address] [place] [id number] [place] [address] Dept: [place]: [phone]           Date of Service:   04/21/2018  Start Time:   2:30 PM End Time:   3:30 PM  Provider/Observer:  Ilean Skill, Psy.D.       Clinical Neuropsychologist       Billing Code/Service: [phone] 4 Units  Chief Complaint:    Diane Short is a 62 year old female who has a complicated medical history including progressive stage IV breast cancer and ongoing chemotherapy for palliative management.  The patient has been followed due to 5 intracerebral aneurysms 3 of which have been treated.  The patient had progressive decline over the past few weeks with intermittent headaches.  She was seen by EMS on 03/22/2018 and taken to the hospital and was unresponsive and requiring intubation.  CT scan of the head showed subarachnoid hemorrhage and subsequent transfer to [facility].  The patient remained intubated until 04/03/2018.  The patient had ongoing issues and was recommended for physical medicine and rehab.  Reason for Service:  HPI: Diane Short is a 53 year old right-handed female who is married reportedly independent up until few weeks ago with complicated medical history to include progressive stage IV progressive breast cancer followed by Dr. Hinton Rao with current completion of second cycle of chemotherapy for palliative management. Patient recently had radiation stereotactic surgery for single solitary brain met as well as history of 5 intracerebral aneurysms 3 of which treated endovascularly. Noted progressive decline over the last few weeks with intermittent headaches. She was seen by EMS 03/22/2018 taken to [facility] for unresponsive episode requiring intubation. CT scan of the head  showed subarachnoid hemorrhage and subsequently transferred to [facility] for further treatment. EEG completed showing general background slowing and no seizure activity. Echocardiogram with ejection fraction of 69% grade 1 diastolic dysfunction. CT angiogram of head and neck showed increased volume of subarachnoid hemorrhage predominantly located in the basal cisterns and sylvian fissures with intraventricular extension in a pattern most consistent with aneurysmal hemorrhage. 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 Kathyrn Sheriff. She remained intubated until 04/03/2018. Maintained on Dilantin/Keppra for seizure prophylaxis.Nimotop for blood pressure control which has been completed. Subcutaneous. Lovenox initiated for DVT prophylaxis 03/31/2018. She is on a dysphagia #2 nectar thick liquid diet. Hospital course palliative care follow-up therapy evaluations completed with recommendations of physical medicine rehab consult. Patient was admitted for a comprehensive rehab program.   Current Status:  The patient reports that she is managing okay with her stage IV breast cancer and ongoing chemotherapy.  She reports that while it is been difficult to be in the hospital she feels like she is getting stronger and recovering from her intracranial hemorrhage.  Behavioral Observation: Diane Short  presents as a 62 y.o.-year-old Right Caucasian Female who appeared her stated age. her dress was Appropriate and she was Well Groomed and her manners were Appropriate to the situation.  her participation was indicative of Appropriate behaviors.  There were any physical disabilities noted.  she displayed an appropriate level of cooperation and motivation.     Interactions:    Active Appropriate  Attention:   abnormal and attention span appeared shorter than expected for age  Memory:  within normal limits; recent and remote memory intact  Visuo-spatial:  not  examined  Speech (Volume):  low  Speech:   normal; normal  Thought Process:  Coherent and Relevant  Though Content:  WNL; not suicidal and not homicidal  Orientation:   person, place, time/date and situation  Judgment:   Good  Planning:   Good  Affect:    Appropriate  Mood:    Euthymic  Insight:   Good  Intelligence:   normal  Medical History:   Past Medical History:  Diagnosis Date  . 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 Anxiety   . Arthritis   . Cancer [facility])    breast cancer  . Cerebral hemorrhage ([place]) 1986  . Depression    due to cancer dx  . Diverticulosis   . GERD (gastroesophageal reflux disease)   . Headache   . History of hiatal hernia   . Hypertension   . 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 Neutropenic fever ([place])    Chemo related  . Osteonecrosis ([place]) 2017-2018   Right Jaw bone  . Thrush, oral    Chemo related         Psychiatric History:  The patient does have a past history of anxiety and has been through a great deal medically after her diagnosis of breast cancer.  There have also been a number of family issues and medical issues that she has had to cope with.  Family Med/Psych History:  Family History  Problem Relation Age of Onset  . Alzheimer's disease Mother   . Colon cancer Father   . Cerebral aneurysm Father   . Diabetes Sister   . Stomach cancer Brother   . Esophageal cancer Brother   . Multiple myeloma Brother   . Hypertension Sister    Impression/DX:  Diane Short is a 62 year old female who has a complicated medical history including progressive stage IV breast cancer and ongoing chemotherapy for palliative management.  The patient has been followed due to 5 intracerebral aneurysms 3 of which have been treated.  The patient had progressive decline over the past few weeks with intermittent headaches.  She was seen by EMS on 03/22/2018 and taken to the hospital and was unresponsive and requiring intubation.  CT scan of the head showed  subarachnoid hemorrhage and subsequent transfer to [facility].  The patient remained intubated until 04/03/2018.  The patient had ongoing issues and was recommended for physical medicine and rehab.  The patient reports that she is managing okay with her stage IV breast cancer and ongoing chemotherapy.  She reports that while it is been difficult to be in the hospital she feels like she is getting stronger and recovering from her intracranial hemorrhage.  Disposition/Plan:  Today we worked on coping and adjustment issues and the patient does appear to be doing fairly well with regard to adjusting to her significant medical complications.  She has very good family support and it appears that she has a great drive to keep working on her medical status and rehab post discharge.  Diagnosis:    SAH (subarachnoid hemorrhage) ([place]) - Plan: Ambulatory referral to Neurology, Ambulatory referral to Physical Medicine Rehab         Electronically Signed   _______________________ Ilean Skill, Psy.D.

## 2018-04-21 NOTE — Discharge Summary (Signed)
Discharge summary job [id number]

## 2018-04-21 NOTE — Progress Notes (Signed)
Occupational Therapy Discharge Summary  Patient Details  Name: Diane Short MRN: [id number] Date of Birth: 11/10/1955  Today's Date: 04/21/2018   Patient has met 52 of 52 long term goals due to improved activity tolerance, improved balance, ability to compensate for deficits, functional use of  LEFT upper and LEFT lower extremity, improved awareness and improved coordination.  Patient to discharge at overall Supervision level with min A needed for tub/shower transfer and supervision level assistance needed for awareness and attention during functional tasks.  Patient's care partner is independent to provide the necessary physical and cognitive assistance at discharge.    Pt's husband has been present for all OT sessions with education provided to both pt and husband regarding pacing, energy conservation techniques, functional transfers including tub/shower transfer, assist level needed at d/c with proper body mechanics and home/community mobility with rollator. Pt and husband are comfortable with assistance needed at d/c by pt. Pt is overall supervision level with min VC's for safety and requires increased time for self care tasks/transfers however pt's husband continues to provide more assistance than needed and not allowing pt time to complete tasks. Continuous education has been provided each session regarding maximizing pt's independence with tasks to increase activity tolerance and confidence in abilities as well as decreasing caregiver burden and risk of injury to caregiver. Education also provided regarding pt requiring VC's needed for safety during functional transfers as well as close supervision level assistance- verbal comprehension by husband and pt.   Recommendation:  Patient will benefit from ongoing skilled OT services in home health setting to continue to advance functional skills in the area of BADL, iADL and Reduce care partner burden.  Equipment: Drop arm BSC, TTB  Reasons  for discharge: treatment goals met and discharge from hospital  Patient/family agrees with progress made and goals achieved: Yes  OT Discharge Precautions/Restrictions  Restrictions Weight Bearing Restrictions: No ADL ADL Grooming: mod I  Where Assessed-Grooming: sitting at sink (rollator) Upper Body Bathing: Supervision/safety Where Assessed-Upper Body Bathing: Shower Lower Body Bathing: Supervision/safety Where Assessed-Lower Body Bathing: Shower Upper Body Dressing: Supervision Where Assessed-Upper Body Dressing: Edge of bed Lower Body Dressing: Supervision/safety Where Assessed-Lower Body Dressing: Edge of bed Toileting: Supervision/safety Where Assessed-Toileting: Glass blower/designer: Close supervision, Minimal verbal cueing Toilet Transfer Method: Counselling psychologist: Grab bars, Other (comment)(BSC over toilet ) Social research officer, government: Close supervision Social research officer, government Method: Heritage manager: Radio broadcast assistant, Grab bars Vision Baseline Vision/History: Wears glasses Wears Glasses: Reading only Patient Visual Report: No change from baseline Vision Assessment?: No apparent visual deficits Ocular Range of Motion: Within Functional Limits Perception  Perception: Within Functional Limits Praxis Praxis: Intact Cognition Overall Cognitive Status: Impaired/Different from baseline Orientation Level: Oriented X4 Focused Attention: Appears intact Sustained Attention: Impaired Selective Attention: Appears intact Memory: Appears intact Awareness: Impaired Awareness Impairment: Anticipatory impairment Safety/Judgment: Impaired Comments: Pt requires VC's for safety with rollator  Sensation Sensation Light Touch: Appears Intact Proprioception: Appears Intact Coordination Gross Motor Movements are Fluid and Coordinated: No Fine Motor Movements are Fluid and Coordinated: No Coordination and Movement Description: Pt has  tremors during movement and at rest- pt reports tremors at baseline  Motor  Motor Motor: Hemiplegia Motor - Discharge Observations: mild deficits present in LUE; tremors impacting motor movements bilaterally Trunk/Postural Assessment  Cervical Assessment Cervical Assessment: Exceptions to WFL(head forward) Thoracic Assessment Thoracic Assessment: Exceptions to WFL(rounded shoulders ) Lumbar Assessment Lumbar Assessment: Exceptions to WFL(posterior pelvic tilt) Postural Control Postural Control: Within Functional  Limits  Balance Balance Balance Assessed: Yes Static Sitting Balance Static Sitting - Balance Support: Feet supported Static Sitting - Level of Assistance: 6: Modified independent (Device/Increase time) Dynamic Sitting Balance Dynamic Sitting - Balance Support: Feet supported Dynamic Sitting - Level of Assistance: 6: Modified independent (Device/Increase time) Sitting balance - Comments: sitting EOB Static Standing Balance Static Standing - Balance Support: During functional activity Static Standing - Level of Assistance: 5: Stand by assistance Dynamic Standing Balance Dynamic Standing - Balance Support: During functional activity Dynamic Standing - Level of Assistance: 5: Stand by assistance Dynamic Standing - Comments: Ambulation during self care tasks Extremity/Trunk Assessment RUE Assessment RUE Assessment: Within Functional Limits Active Range of Motion (AROM) Comments: Full ROM of shoulder, elbow, wrist  General Strength Comments: Shoulder, elbow, wrist 4/5 LUE Assessment LUE Assessment: Within Functional Limits Active Range of Motion (AROM) Comments: Full ROM of shoulder, elbow, wrist  General Strength Comments: Shoulder, elbow, wrist 4/5; decreased grip strength in L    Diane Short 04/21/2018, 7:47 AM

## 2018-04-21 NOTE — Discharge Instructions (Signed)
Inpatient Rehab Discharge Instructions  Liliya Fullenwider Discharge date and time: No discharge date for patient encounter.   Activities/Precautions/ Functional Status: Activity: activity as tolerated Diet: Soft Wound Care: none needed Functional status:  ___ No restrictions     ___ Walk up steps independently ___ 24/7 supervision/assistance   ___ Walk up steps with assistance ___ Intermittent supervision/assistance  ___ Bathe/dress independently ___ Walk with walker     _x__ Bathe/dress with assistance ___ Walk Independently    ___ Shower independently ___ Walk with assistance    ___ Shower with assistance ___ No alcohol     ___ Return to work/school ________  Special Instructions:    COMMUNITY REFERRALS UPON DISCHARGE:    Home Health:   PT, OT, SP  Agency:[facility]  Phone:[phone]  Date of last service:04/22/2018  Medical Equipment/Items Ordered:ROLLATOR ROLLING WALKER, 3 IN 1, [place]   [phone]   GENERAL COMMUNITY RESOURCES FOR PATIENT/FAMILY: Support Groups:BREAST CANCER SUPPORT GROUP CALL [place] [phone]   My questions have been answered and I understand these instructions. I will adhere to these goals and the provided educational materials after my discharge from the hospital.  Patient/Caregiver Signature _______________________________ Date __________  Clinician Signature _______________________________________ Date __________  Please bring this form and your medication list with you to all your follow-up doctor's appointments.

## 2018-04-21 NOTE — Progress Notes (Addendum)
Social Work Patient ID: Diane Short, female   DOB: 26-Jan-1956, 62 y.o.   MRN: [id number]  Met with pt and husband to discuss team conference goals supervision-mod/i level and discharge tomorrow. Have ordered equipment and pt request [facility] which they report they can not staff. Looking for another agency to take pt. Both pleased with discharge and pt's progress here. [facility] can provide home health services to pt.

## 2018-04-21 NOTE — Patient Care Conference (Signed)
Inpatient RehabilitationTeam Conference and Plan of Care Update Date: 04/21/2018   Time: 2:10 PM    Patient Name: Diane Short      Medical Record Number: [id number]  Date of Birth: 1956/06/28 Sex: Female         Room/Bed: 4M08C/4M08C-01 Payor Info: Payor: MEDICAID [place] / Plan: MEDICAID OF [place] / Product Type: *No Product type* /    Admitting Diagnosis: subarachnoid hemorheage  Admit Date/Time:  04/16/2018  4:44 PM Admission Comments: No comment available   Primary Diagnosis:  <principal problem not specified> Principal Problem: <principal problem not specified>  Patient Active Problem List   Diagnosis Date Noted  . Prediabetes   . Labile blood pressure   . Labile blood glucose   . Hypoglycemia   . Transaminitis   . Hyperglycemia   . Right hemiparesis ([place])   . Stage IV breast cancer in female [facility])   . Essential hypertension   . Seizure prophylaxis   . Anemia of chronic disease   . Dysphagia, oropharyngeal   . Pressure injury of skin 04/11/2018  . Goals of care, counseling/discussion   . Palliative care by specialist   . Adult failure to thrive   . Endotracheal tube present   . SAH (subarachnoid hemorrhage) ([place]) 03/22/2018  . Malignant neoplasm of left breast in female, estrogen receptor negative ([place]) 01/07/2018  . Brain metastasis ([place]) 01/07/2018  . Cerebral aneurysm, nonruptured 03/13/2017    Expected Discharge Date: Expected Discharge Date: 04/22/18  Team Members Present: Physician leading conference: Dr. Delice Lesch Social Worker Present: Ovidio Kin, LCSW Nurse Present: Rayetta Humphrey, RN PT Present: Kem Parkinson, PT OT Present: Napoleon Form, OT SLP Present: Windell Moulding, SLP PPS Coordinator present : Daiva Nakayama, RN, CRRN     Current Status/Progress Goal Weekly Team Focus  Medical   Right side weakness, dysphagia secondary to aneurysmal bleed/SAH   Improve mobility, safety, prediabetes, HTN, WBCs, LFTs  See above   Bowel/Bladder   mostly cont b/b;  lbm 10/6  assess q shhift anf prn  offer toileting q 3hr; assess q shift and prn    Swallow/Nutrition/ Hydration             ADL's   Supervision for dressing/grooming/bathing/toileting; min A tub/shower transfer   d/c supervision level with min A for tub/shower transfer  Dynamic standing balance, ADL re-training, home mobility, tub/shower transfer. Plan to d/c 10/10- pt and husband eager to go home   Mobility   S/min guard overall  S/min guard overall  pt/family education, activity tolerance, LE strength   Communication             Safety/Cognition/ Behavioral Observations            Pain   c/o headache; tylenol  <3  assess q shift and prn   Skin   masd buttocks/groin  free from infection/breakdown  assess q shift and prn; barrier cream      *See Care Plan and progress notes for long and short-term goals.     Barriers to Discharge  Current Status/Progress Possible Resolutions Date Resolved   Physician    Medical stability     See above  Therapies, plan d/c tomorrow      Nursing                  PT  Medical stability;Wound Care;Pending chemo/radiation                 OT  SLP                SW                Discharge Planning/Teaching Needs:  Home with husband who stays here with her and is particiapting in her care while here, has been checked off for her transfers. Both are eager to return home. Neuro-psych to see today      Team Discussion:  Progressing toward her goals of mod/i-supervision level. Neuro-psych seeing for support/coping. Building endurance. Upgraded diet to Dys 3 thin. Chronic medical issues managed per MD. Lorelee Cover tyo go home. Husband here daily and participates in therapies and pt's care.  Revisions to Treatment Plan:  DC 10/10    Continued Need for Acute Rehabilitation Level of Care: The patient requires daily medical management by a physician with specialized training in physical medicine and rehabilitation for the following  conditions: Daily analysis of laboratory values and/or radiology reports with any subsequent need for medication adjustment of medical intervention for : Neurological problems;Other;Diabetes problems   I attest that I was present, lead the team conference, and concur with the assessment and plan of the team.   Elease Hashimoto 04/21/2018, 4:04 PM

## 2018-04-21 NOTE — Progress Notes (Signed)
Speech Language Pathology Discharge Summary  Patient Details  Name: Diane Short MRN: [id number] Date of Birth: [birth year]  Today's Date: 04/21/2018 SLP Individual Time: 0820-0900 SLP Individual Time Calculation (min): 40 min   Skilled Therapeutic Interventions:   Skilled ST services focused on cognitive skills and education. Pt was toileting/getting dressed and requested SLP wait outside of room, missed 20 minutes of session. SLP facilitated semi-complex problem solving and error awareness skills utilizing money management task requiring supervision A verbal cues and calendar organization task pt required min A cues. SLP educated on recall and organization strategies. All questions answered to satisfaction. Pt was left in room with call bell within reach and bed/chair alarm set. SLP reccomends to continue skilled services.    Patient has met 5 of 6 long term goals.  Patient to discharge at overall Supervision;Min;Modified Independent level.  Reasons goals not met:     Clinical Impression/Discharge Summary:   Pt demonstrated great progress meeting 5 out 6 goals during limited length of stay. Pt demonstrates ability to consume dys 3 and thin diet, basic problem solving, basic error awareness and use of recall strategies. Pt continues to require increase cuing for semi-complex problem solving, higher level error awareness and attention. Pt would continue to benefit from skilled ST services in order to maximize functional independence and reduce burden of care requiring 24 hour supervision and continues ST services.   Care Partner:  Caregiver Able to Provide Assistance: Yes  Type of Caregiver Assistance: Physical;Cognitive  Recommendation:  Home Health SLP;24 hour supervision/assistance;Outpatient SLP  Rationale for SLP Follow Up: Maximize cognitive function and independence;Maximize functional communication;Reduce caregiver burden   Equipment: N/A   Reasons for discharge: Discharged  from hospital   Patient/Family Agrees with Progress Made and Goals Achieved: Yes   Function:  Eating Eating                 Cognition Comprehension    Expression      Social Interaction    Problem Solving    Memory     [name]  [facility] 04/21/2018, 4:53 PM

## 2018-04-21 NOTE — Progress Notes (Signed)
[place] PHYSICAL MEDICINE & REHABILITATION PROGRESS NOTE  Subjective/Complaints: Patient seen laying in bed this morning.  Husband at bedside.  She states she slept well overnight.  She has questions about her discharge date.  Husband has questions regarding occasional confusion.  Discussed.  ROS: Denies CP, S OB, nausea, vomiting, diarrhea.  Objective: Vital Signs: Blood pressure (!) 156/88, pulse 89, temperature 98.4 F (36.9 C), temperature source Oral, resp. rate 18, height 5' 4.5" (1.638 m), weight 87.3 kg, last menstrual period 08/14/2010, SpO2 100 %. No results found. Recent Labs    04/19/18 0716  WBC 10.8*  HGB 10.5*  HCT 34.5*  PLT 216   Recent Labs    04/19/18 0716  NA 136  K 3.7  CL 102  CO2 25  GLUCOSE 117*  BUN 5*  CREATININE 0.68  CALCIUM 8.9    Physical Exam: BP (!) 156/88 (BP Location: Right Leg)   Pulse 89   Temp 98.4 F (36.9 C) (Oral)   Resp 18   Ht 5' 4.5" (1.638 m)   Wt 87.3 kg   LMP 08/14/2010 (Exact Date)   SpO2 100%   BMI 32.53 kg/m  Constitutional: Well-developed.  NAD.  Obese. HENT: Normocephalic.  Atraumatic. Eyes: EOMI.  No discharge. Cardiovascular: RRR.  No JVD. Respiratory: Effort normal and breath sounds normal.  GI: She exhibits no distension.  Bowel sounds normal. Musculoskeletal: No edema or tenderness in extremities. Neurological:  Alert.  Makes good eye contact with examiner.  Follows full commands.  Some delays with processing  Motor: RUE/RLE 4--4+/5, stable.  LUE/LLE: 4+/5, stable Skin: Skin is warm.  Intact. Psychiatric: Pleasant and cooperative   Assessment/Plan: 1. Functional deficits secondary to [facility] which require 3+ hours per day of interdisciplinary therapy in a comprehensive inpatient rehab setting.  Physiatrist is providing close team supervision and 24 hour management of active medical problems listed below.  Physiatrist and rehab team continue to assess barriers to discharge/monitor patient  progress toward functional and medical goals  Care Tool:  Bathing    Body parts bathed by patient: Right arm, Left arm, Chest, Abdomen, Front perineal area, Face, Right upper leg, Left upper leg, Right lower leg, Left lower leg, Buttocks   Body parts bathed by helper: Buttocks     Bathing assist Assist Level: Supervision/Verbal cueing     Upper Body Dressing/Undressing Upper body dressing   What is the patient wearing?: Pull over shirt    Upper body assist Assist Level: Set up assist    Lower Body Dressing/Undressing Lower body dressing      What is the patient wearing?: Underwear/pull up, Pants     Lower body assist Assist for lower body dressing: Moderate Assistance - Patient 50 - 74%     Toileting Toileting    Toileting assist Assist for toileting: Supervision/Verbal cueing     Transfers Chair/bed transfer  Transfers assist  Chair/bed transfer activity did not occur: N/A  Chair/bed transfer assist level: Moderate Assistance - Patient 50 - 74%     Locomotion Ambulation   Ambulation assist      Assist level: Minimal Assistance - Patient > 75% Assistive device: Hand held assist(1 hall rail and w/c follow) Max distance: 30 ft   Walk 10 feet activity   Assist  Walk 10 feet activity did not occur: Safety/medical concerns  Assist level: Minimal Assistance - Patient > 75% Assistive device: Hand held assist(1 hall rail and w/c follow)   Walk 50 feet activity   Assist Walk  50 feet with 2 turns activity did not occur: Safety/medical concerns         Walk 150 feet activity   Assist Walk 150 feet activity did not occur: Safety/medical concerns         Walk 10 feet on uneven surface  activity   Assist Walk 10 feet on uneven surfaces activity did not occur: Safety/medical concerns         Wheelchair     Assist   Type of Wheelchair: Manual    Wheelchair assist level: Supervision/Verbal cueing Max wheelchair distance: 10 ft     Wheelchair 50 feet with 2 turns activity    Assist    Wheelchair 50 feet with 2 turns activity did not occur: Safety/medical concerns   Assist Level: Minimal Assistance - Patient > 75%   Wheelchair 150 feet activity     Assist Wheelchair 150 feet activity did not occur: Safety/medical concerns          Medical Problem List and Plan:  1. Right side weakness, dysphagia secondary to aneurysmal bleed/SAH   Continue CIR  Plan for d/c tomorrow  Will see patient for transitional care management in 1-2 weeks post-discharge 2. DVT Prophylaxis/Anticoagulation: Subcutaneous Lovenox. Monitor for any bleeding episodes  3. Pain Management: Tylenol as needed  4. Mood: Seroquel 25 mg nightly  5. Neuropsych: This patient is capable of making decisions on her own behalf.  6. Skin/Wound Care: Routine skin checks  7. Fluids/Electrolytes/Nutrition: Routine in and outs  BMP within acceptable range on 10/4 8. Stage IV breast cancer. Palliative chemotherapy completed. Follow-up Dr. Hinton Rao   Palliative care following patient  9. Dysphagia. Dysphasia #2 nectar liquids, advance to D2 thins, advance to D3 thins on 10/8  Continue to advance as tolerated 10. Hypertension. Norvasc 10 mg daily, Coreg 25 mg twice daily, labetalol 100 mg 3 times daily   ?  Trending up on 10/9 11. Seizure prophylaxis. Dilantin 100 mg 3 times daily, Keppra 750 mg twice daily  12.  Anemia of chronic disease  Hemoglobin 10.5 on 10/7  Continue to monitor 13.  Borderline prediabetes  Hemoglobin A1c 5.7 on 10/7  Controlled on 10/9  CBGs DC'd on 10/9 14.  Transaminitis  LFTs elevated on 10/7  Continue to monitor 15.  Leukocytosis  Afebrile  WBCs 10.8 on 10/7  Continue to monitor  LOS: 5 days A FACE TO FACE EVALUATION WAS PERFORMED  Ankit Lorie Phenix 04/21/2018, 8:24 AM

## 2018-04-21 NOTE — Progress Notes (Signed)
Physical Therapy Session Note  Patient Details  Name: Diane Short MRN: [id number] Date of Birth: 1955-09-29  Today's Date: 04/21/2018 PT Individual Time: 0920-0950 PT Individual Time Calculation (min): 30 min   Short Term Goals: Week 1:  PT Short Term Goal 1 (Week 1): Pt will perform bed<>WC transfer with mod assist  PT Short Term Goal 2 (Week 1): Pt will ambulate 2ft with mod assist  PT Short Term Goal 3 (Week 1): Pt will propell WC 141ft with supervision assist  PT Short Term Goal 4 (Week 1): Pt will perform bed mobiltiy with supervision assist     Skilled Therapeutic Interventions/Progress Updates:   Pt in bed, using cell phone. Bed mobility with mod assist and mod cues for sequencing.   PT discussed with pt and husband bed mobility at home on regular bed.  Husband reported that their bed is high.  Pt reported needing to use toilet.  Stand pivot bed> 4WW with min guard, min cues for brakes of walker.  Gait to toilet and sink with contact guard assist 4WW.  Pt continent of urine on toilet. After washing hands and brushing teeth at sink,  pt needed seated rest break, using 4WW safely.   Gait to return to recliner; feet elevated and back reclined slightly.    PT educated pt and husband about increasing OOB tolerance to improve general endurance and trunk strength.   They expressed understanding.    2 x 10 bil ankle pumps.   Pt left resting in recliner with needs at hand and husband present.    Therapy Documentation Precautions:  Precautions Precautions: Fall Restrictions Weight Bearing Restrictions: No  Pain: 2/10 HA; premedicated     Therapy/Group: Individual Therapy  Braxson Hollingsworth 04/21/2018, 4:39 PM

## 2018-04-22 ENCOUNTER — Inpatient Hospital Stay (HOSPITAL_COMMUNITY): Payer: Medicaid Other | Admitting: Physical Therapy

## 2018-04-22 LAB — GLUCOSE, CAPILLARY: Glucose-Capillary: 95 mg/dL (ref 70–99)

## 2018-04-22 MED ORDER — LEVETIRACETAM 750 MG PO TABS: 750.0000 mg | ORAL_TABLET | Freq: Two times a day (BID) | ORAL | 1 refills | Status: DC

## 2018-04-22 MED ORDER — ACETAMINOPHEN 325 MG PO TABS: 325.0000 mg | ORAL_TABLET | ORAL | Status: AC | PRN

## 2018-04-22 MED ORDER — GERHARDT'S BUTT CREAM: 1.0000 "application " | TOPICAL_CREAM | Freq: Four times a day (QID) | CUTANEOUS | 1 refills | Status: DC

## 2018-04-22 MED ORDER — LABETALOL HCL 100 MG PO TABS: 100.0000 mg | ORAL_TABLET | Freq: Three times a day (TID) | ORAL | 1 refills | Status: AC

## 2018-04-22 MED ORDER — PHENYTOIN SODIUM EXTENDED 300 MG PO CAPS: 300.0000 mg | ORAL_CAPSULE | Freq: Every day | ORAL | 1 refills | Status: AC

## 2018-04-22 MED ORDER — QUETIAPINE FUMARATE 25 MG PO TABS: 25.0000 mg | ORAL_TABLET | Freq: Every day | ORAL | 0 refills | Status: AC

## 2018-04-22 MED ORDER — CARVEDILOL 25 MG PO TABS: 25.0000 mg | ORAL_TABLET | Freq: Two times a day (BID) | ORAL | 1 refills | Status: AC

## 2018-04-22 MED ORDER — AMLODIPINE BESYLATE 10 MG PO TABS: 10.0000 mg | ORAL_TABLET | Freq: Every day | ORAL | 1 refills | Status: AC

## 2018-04-22 MED ORDER — FAMOTIDINE 20 MG PO TABS: 20.0000 mg | ORAL_TABLET | Freq: Every day | ORAL | 1 refills | Status: AC

## 2018-04-22 NOTE — Progress Notes (Signed)
Social Work  Discharge Note  The overall goal for the admission was met for:   Discharge location: Yes-HOME WITH HUSBAND WHO CAN PROVIDE 24 HR CARE  Length of Stay: Yes-7 DAYS  Discharge activity level: Yes-SUPERVISION LEVEL  Home/community participation: Yes  Services provided included: MD, RD, PT, OT, SLP, RN, CM, Pharmacy, Neuropsych and SW  Financial Services: Medicaid  Follow-up services arranged: Home Health: [facility]-PT,OT,SP, DME: [place], 3 IN 1, [facility] and Patient/Family has no preference for HH/DME agencies  Comments (or additional information):HUSBAND HAS BEEN STAYING [place]. HE WILL PROVIDE 24 HR CARE AND AT TIMES DOES TOO MUCH FOR PT  Patient/Family verbalized understanding of follow-up arrangements: Yes  Individual responsible for coordination of the follow-up plan: LARRY-HUSBAND  Confirmed correct DME delivered: Elease Hashimoto 04/22/2018    Elease Hashimoto

## 2018-04-22 NOTE — Progress Notes (Signed)
Patient discharged to home per wheelchair accompanied by NT and husband. Discharge instructions done by Linna Hoff PA no further questions noted.

## 2018-04-23 ENCOUNTER — Telehealth: Payer: Self-pay

## 2018-04-23 NOTE — Telephone Encounter (Addendum)
Transitional Care call-patient    1. Are you/is patient experiencing any problems since coming home?No Are there any questions regarding any aspect of care?No 2. Are there any questions regarding medications administration/dosing? YesAre meds being taken as prescribed?Yes Patient should review meds with caller to confirm 3. Have there been any falls?No 4. Has Home Health been to the house and/or have they contacted you?Yes If not, have you tried to contact them? Can we help you contact them? 5. Are bowels and bladder emptying properly?Yes Are there any unexpected incontinence issues?No If applicable, is patient following bowel/bladder programs? 6. Any fevers, problems with breathing, unexpected pain? No 7. Are there any skin problems or new areas of breakdown?No 8. Has the patient/family member arranged specialty MD follow up (ie cardiology/neurology/renal/surgical/etc)? Yes Can we help arrange? 9. Does the patient need any other services or support that we can help arrange?No 10. Are caregivers following through as expected in assisting the patient?No 11. Has the patient quit smoking, drinking alcohol, or using drugs as recommended?Yes  Appointment time, arrive time 11:20 for 11:40 with Dr. Posey Pronto on 05/06/18 [place] [address]

## 2018-04-27 ENCOUNTER — Telehealth: Payer: Self-pay

## 2018-04-27 NOTE — Telephone Encounter (Signed)
Mark, PT from [facility] called requesting verbal orders for HHPT 1wk1 and 2wk4. Approved orders per discharge summary.

## 2018-04-28 ENCOUNTER — Telehealth: Payer: Self-pay

## 2018-04-28 DIAGNOSIS — D6489 Other specified anemias: Secondary | ICD-10-CM

## 2018-04-28 DIAGNOSIS — C7951 Secondary malignant neoplasm of bone: Secondary | ICD-10-CM | POA: Diagnosis not present

## 2018-04-28 DIAGNOSIS — Z7982 Long term (current) use of aspirin: Secondary | ICD-10-CM

## 2018-04-28 DIAGNOSIS — C771 Secondary and unspecified malignant neoplasm of intrathoracic lymph nodes: Secondary | ICD-10-CM

## 2018-04-28 DIAGNOSIS — C7931 Secondary malignant neoplasm of brain: Secondary | ICD-10-CM | POA: Diagnosis not present

## 2018-04-28 DIAGNOSIS — G620 Drug-induced polyneuropathy: Secondary | ICD-10-CM

## 2018-04-28 DIAGNOSIS — Z79899 Other long term (current) drug therapy: Secondary | ICD-10-CM

## 2018-04-28 DIAGNOSIS — I890 Lymphedema, not elsewhere classified: Secondary | ICD-10-CM

## 2018-04-28 DIAGNOSIS — Z9221 Personal history of antineoplastic chemotherapy: Secondary | ICD-10-CM

## 2018-04-28 DIAGNOSIS — Z923 Personal history of irradiation: Secondary | ICD-10-CM

## 2018-04-28 DIAGNOSIS — C50812 Malignant neoplasm of overlapping sites of left female breast: Secondary | ICD-10-CM | POA: Diagnosis not present

## 2018-04-28 NOTE — Telephone Encounter (Signed)
Erin ST [facility] called stating further service for this patient is no longer required at this time for speech therapy. Called her back and confirmed message.

## 2018-04-30 DIAGNOSIS — J9691 Respiratory failure, unspecified with hypoxia: Secondary | ICD-10-CM

## 2018-04-30 DIAGNOSIS — D649 Anemia, unspecified: Secondary | ICD-10-CM | POA: Diagnosis not present

## 2018-04-30 DIAGNOSIS — F419 Anxiety disorder, unspecified: Secondary | ICD-10-CM | POA: Diagnosis not present

## 2018-04-30 DIAGNOSIS — J690 Pneumonitis due to inhalation of food and vomit: Secondary | ICD-10-CM | POA: Diagnosis not present

## 2018-04-30 DIAGNOSIS — I10 Essential (primary) hypertension: Secondary | ICD-10-CM

## 2018-05-01 DIAGNOSIS — F419 Anxiety disorder, unspecified: Secondary | ICD-10-CM | POA: Diagnosis not present

## 2018-05-01 DIAGNOSIS — J690 Pneumonitis due to inhalation of food and vomit: Secondary | ICD-10-CM | POA: Diagnosis not present

## 2018-05-01 DIAGNOSIS — J9691 Respiratory failure, unspecified with hypoxia: Secondary | ICD-10-CM | POA: Diagnosis not present

## 2018-05-01 DIAGNOSIS — D649 Anemia, unspecified: Secondary | ICD-10-CM | POA: Diagnosis not present

## 2018-05-02 DIAGNOSIS — D649 Anemia, unspecified: Secondary | ICD-10-CM | POA: Diagnosis not present

## 2018-05-02 DIAGNOSIS — F419 Anxiety disorder, unspecified: Secondary | ICD-10-CM | POA: Diagnosis not present

## 2018-05-02 DIAGNOSIS — J9691 Respiratory failure, unspecified with hypoxia: Secondary | ICD-10-CM | POA: Diagnosis not present

## 2018-05-02 DIAGNOSIS — J690 Pneumonitis due to inhalation of food and vomit: Secondary | ICD-10-CM | POA: Diagnosis not present

## 2018-05-03 ENCOUNTER — Telehealth: Payer: Self-pay

## 2018-05-03 DIAGNOSIS — J9691 Respiratory failure, unspecified with hypoxia: Secondary | ICD-10-CM | POA: Diagnosis not present

## 2018-05-03 DIAGNOSIS — D649 Anemia, unspecified: Secondary | ICD-10-CM | POA: Diagnosis not present

## 2018-05-03 DIAGNOSIS — J690 Pneumonitis due to inhalation of food and vomit: Secondary | ICD-10-CM | POA: Diagnosis not present

## 2018-05-03 DIAGNOSIS — F419 Anxiety disorder, unspecified: Secondary | ICD-10-CM | POA: Diagnosis not present

## 2018-05-03 NOTE — Telephone Encounter (Signed)
Izora Gala, OT/[facility] called requesting verbal orders for 1wk4, orders approved and given per discharge summary.

## 2018-05-06 ENCOUNTER — Ambulatory Visit: Payer: Medicaid Other | Admitting: Neurology

## 2018-05-06 ENCOUNTER — Encounter: Payer: Self-pay | Admitting: Physical Medicine & Rehabilitation

## 2018-05-06 ENCOUNTER — Encounter: Payer: Medicaid Other | Attending: Physical Medicine & Rehabilitation | Admitting: Physical Medicine & Rehabilitation

## 2018-05-06 ENCOUNTER — Telehealth: Payer: Self-pay | Admitting: Neurology

## 2018-05-06 ENCOUNTER — Other Ambulatory Visit: Payer: Self-pay

## 2018-05-06 ENCOUNTER — Encounter: Payer: Self-pay | Admitting: Neurology

## 2018-05-06 VITALS — BP 100/66 | HR 92 | Ht 64.5 in | Wt 197.0 lb

## 2018-05-06 VITALS — BP 94/64 | HR 83 | Ht 64.0 in | Wt 197.6 lb

## 2018-05-06 DIAGNOSIS — R569 Unspecified convulsions: Secondary | ICD-10-CM | POA: Insufficient documentation

## 2018-05-06 DIAGNOSIS — Z9889 Other specified postprocedural states: Secondary | ICD-10-CM | POA: Diagnosis not present

## 2018-05-06 DIAGNOSIS — Z8673 Personal history of transient ischemic attack (TIA), and cerebral infarction without residual deficits: Secondary | ICD-10-CM | POA: Insufficient documentation

## 2018-05-06 DIAGNOSIS — G620 Drug-induced polyneuropathy: Secondary | ICD-10-CM | POA: Diagnosis not present

## 2018-05-06 DIAGNOSIS — G939 Disorder of brain, unspecified: Secondary | ICD-10-CM

## 2018-05-06 DIAGNOSIS — Z8249 Family history of ischemic heart disease and other diseases of the circulatory system: Secondary | ICD-10-CM | POA: Insufficient documentation

## 2018-05-06 DIAGNOSIS — M1990 Unspecified osteoarthritis, unspecified site: Secondary | ICD-10-CM | POA: Insufficient documentation

## 2018-05-06 DIAGNOSIS — R1310 Dysphagia, unspecified: Secondary | ICD-10-CM | POA: Diagnosis not present

## 2018-05-06 DIAGNOSIS — Z833 Family history of diabetes mellitus: Secondary | ICD-10-CM | POA: Insufficient documentation

## 2018-05-06 DIAGNOSIS — T451X5A Adverse effect of antineoplastic and immunosuppressive drugs, initial encounter: Secondary | ICD-10-CM

## 2018-05-06 DIAGNOSIS — Z298 Encounter for other specified prophylactic measures: Secondary | ICD-10-CM

## 2018-05-06 DIAGNOSIS — R531 Weakness: Secondary | ICD-10-CM | POA: Diagnosis present

## 2018-05-06 DIAGNOSIS — I10 Essential (primary) hypertension: Secondary | ICD-10-CM | POA: Diagnosis not present

## 2018-05-06 DIAGNOSIS — C7931 Secondary malignant neoplasm of brain: Secondary | ICD-10-CM

## 2018-05-06 DIAGNOSIS — I609 Nontraumatic subarachnoid hemorrhage, unspecified: Secondary | ICD-10-CM | POA: Diagnosis not present

## 2018-05-06 DIAGNOSIS — G9340 Encephalopathy, unspecified: Secondary | ICD-10-CM

## 2018-05-06 DIAGNOSIS — Z87891 Personal history of nicotine dependence: Secondary | ICD-10-CM | POA: Insufficient documentation

## 2018-05-06 DIAGNOSIS — R269 Unspecified abnormalities of gait and mobility: Secondary | ICD-10-CM

## 2018-05-06 DIAGNOSIS — C50919 Malignant neoplasm of unspecified site of unspecified female breast: Secondary | ICD-10-CM | POA: Insufficient documentation

## 2018-05-06 DIAGNOSIS — Z9012 Acquired absence of left breast and nipple: Secondary | ICD-10-CM | POA: Diagnosis not present

## 2018-05-06 DIAGNOSIS — G629 Polyneuropathy, unspecified: Secondary | ICD-10-CM | POA: Insufficient documentation

## 2018-05-06 DIAGNOSIS — Z800 Family history of malignant neoplasm of digestive organs: Secondary | ICD-10-CM | POA: Insufficient documentation

## 2018-05-06 MED ORDER — LEVETIRACETAM 750 MG PO TABS: 750.0000 mg | ORAL_TABLET | Freq: Two times a day (BID) | ORAL | 1 refills | Status: DC

## 2018-05-06 NOTE — Telephone Encounter (Signed)
Rn spoke with DR. Leonie Man about pts keppra. He wants pt to take 1/2 keppra bid for 2 months,and than discontinue.

## 2018-05-06 NOTE — Telephone Encounter (Signed)
Diane Short with [facility] requesting a call stating she has a few questions regarding medication levETIRAcetam (KEPPRA) 750 MG tablet please advise at [phone]

## 2018-05-06 NOTE — Patient Instructions (Signed)
I had a long discussion with the patient and her husband regarding her recent aneurysmal subarachnoid hemorrhage and encephalopathy/delirium with questionable seizures.  She is doing quite well and I recommend we check an EEG and start tapering Keppra to 375 mg twice daily for 2 months and then stop.  Continue Dilantin 300 mg daily for now.  She will continue follow-up for her cerebral aneurysms with Dr. Kathyrn Sheriff neurosurgeon.  She will return for follow-up with my nurse practitioner in 2 months or call earlier if necessary.

## 2018-05-06 NOTE — Progress Notes (Signed)
Subjective:    Patient ID: Diane Short, female    DOB: [birth year], 62 y.o.   MRN: [id number]  HPI 62 year old right-handed female with past medical history of progressive stage IV breast cancer presents for transitional care management after receiving CIR for aneurysmal bleed/SAH.   DATE OF ADMISSION: 04/16/2018 DATE OF DISCHARGE: 04/22/2018   She states she went to the hospital for aspiration PNA and treated with abx..  At discharge, she was instructed to follow up with Neurosurgery, which she does not have. She saw Oncology.  She sees PCP next week. She is swallowing without difficulty. BP is controlled. Denies falls.  Therapies: 2/week Mobility:  Rollator at all time for the most part DME: Bedside commode, shower chair  Pain Inventory Average Pain 2 Pain Right Now 0 My pain is na  In the last 24 hours, has pain interfered with the following? General activity 0 Relation with others 0 Enjoyment of life 0 What TIME of day is your pain at its worst? na Sleep (in general) Good  Pain is worse with: na Pain improves with: na Relief from Meds: na  Mobility use a walker do you drive?  no  Function retired  Neuro/Psych No problems in this area  Prior Studies Any changes since last visit?  no  Physicians involved in your care Any changes since last visit?  no   Family History  Problem Relation Age of Onset  . Alzheimer's disease Mother   . Colon cancer Father   . Cerebral aneurysm Father   . Diabetes Sister   . Stomach cancer Brother   . Esophageal cancer Brother   . Multiple myeloma Brother   . Hypertension Sister    Social History   Socioeconomic History  . Marital status: Married    Spouse name: Not on file  . Number of children: Not on file  . Years of education: Not on file  . Highest education level: Not on file  Occupational History  . Not on file  Social Needs  . Financial resource strain: Not on file  . Food insecurity:    Worry: Not  on file    Inability: Not on file  . Transportation needs:    Medical: Not on file    Non-medical: Not on file  Tobacco Use  . Smoking status: Former Smoker    Packs/day: 0.50    Years: 42.00    Pack years: 21.00    Types: Cigarettes  . Smokeless tobacco: Never Used  Substance and Sexual Activity  . Alcohol use: No  . Drug use: No  . Sexual activity: Not on file  Lifestyle  . Physical activity:    Days per week: Not on file    Minutes per session: Not on file  . Stress: Not on file  Relationships  . Social connections:    Talks on phone: Not on file    Gets together: Not on file    Attends religious service: Not on file    Active member of club or organization: Not on file    Attends meetings of clubs or organizations: Not on file    Relationship status: Not on file  Other Topics Concern  . Not on file  Social History Narrative   Reside with spouse    Past Surgical History:  Procedure Laterality Date  . BREAST SURGERY Left 11/2016   metastic to bone  . CATARACT EXTRACTION Right 03/02/2017  . CEREBRAL ANGIOGRAM  1986  . CESAREAN SECTION  [place]  01/20/2017  . IR 3D INDEPENDENT WKST  05/26/2017  . IR ANGIO INTRA EXTRACRAN SEL COM CAROTID INNOMINATE UNI R MOD SED  04/09/2017  . IR ANGIO INTRA EXTRACRAN SEL INTERNAL CAROTID BILAT MOD SED  01/20/2017  . IR ANGIO INTRA EXTRACRAN SEL INTERNAL CAROTID BILAT MOD SED  11/17/2017  . IR ANGIO INTRA EXTRACRAN SEL INTERNAL CAROTID BILAT MOD SED  03/23/2018  . IR ANGIO INTRA EXTRACRAN SEL INTERNAL CAROTID UNI R MOD SED  03/13/2017  . IR ANGIO VERTEBRAL SEL VERTEBRAL BILAT MOD SED  01/20/2017  . IR ANGIO VERTEBRAL SEL VERTEBRAL UNI L MOD SED  05/26/2017  . IR ANGIO VERTEBRAL SEL VERTEBRAL UNI L MOD SED  11/17/2017  . IR ANGIOGRAM FOLLOW UP STUDY  03/13/2017  . IR ANGIOGRAM FOLLOW UP STUDY  03/13/2017  . IR ANGIOGRAM FOLLOW UP STUDY  03/13/2017  . IR ANGIOGRAM FOLLOW UP STUDY  03/13/2017  . IR ANGIOGRAM FOLLOW UP STUDY   03/13/2017  . IR ANGIOGRAM FOLLOW UP STUDY  03/13/2017  . IR ANGIOGRAM FOLLOW UP STUDY  03/13/2017  . IR ANGIOGRAM FOLLOW UP STUDY  03/13/2017  . IR ANGIOGRAM FOLLOW UP STUDY  03/13/2017  . IR ANGIOGRAM FOLLOW UP STUDY  03/13/2017  . IR ANGIOGRAM FOLLOW UP STUDY  03/13/2017  . IR ANGIOGRAM FOLLOW UP STUDY  03/13/2017  . IR ANGIOGRAM FOLLOW UP STUDY  04/09/2017  . IR ANGIOGRAM FOLLOW UP STUDY  05/26/2017  . IR ANGIOGRAM FOLLOW UP STUDY  05/26/2017  . IR ANGIOGRAM FOLLOW UP STUDY  03/23/2018  . IR NEURO EACH ADD'L AFTER BASIC UNI LEFT (MS)  04/09/2017  . IR NEURO EACH ADD'L AFTER BASIC UNI LEFT (MS)  03/23/2018  . IR NEURO EACH ADD'L AFTER BASIC UNI RIGHT (MS)  03/13/2017  . IR TRANSCATH/EMBOLIZ  03/13/2017  . IR TRANSCATH/EMBOLIZ  04/09/2017  . IR TRANSCATH/EMBOLIZ  05/26/2017  . IR TRANSCATH/EMBOLIZ  03/23/2018  . IR US GUIDE VASC ACCESS RIGHT  04/09/2017  . MASTECTOMY Left   . port a cathh  04/2016  . RADIOLOGY WITH ANESTHESIA N/A 03/13/2017   Procedure: stent coiling of right MCA aneurysm;  Surgeon: Consuella Lose, MD;  Location: [place];  Service: Radiology;  Laterality: N/A;  . RADIOLOGY WITH ANESTHESIA N/A 04/09/2017   Procedure: Arteriogram, pipeline embolization of aneruysm;  Surgeon: Consuella Lose, MD;  Location: [facility];  Service: Radiology;  Laterality: N/A;  . RADIOLOGY WITH ANESTHESIA N/A 05/26/2017   Procedure: Stent supported coil embolization;  Surgeon: Consuella Lose, MD;  Location: [place];  Service: Radiology;  Laterality: N/A;  . RADIOLOGY WITH ANESTHESIA N/A 03/23/2018   Procedure: IR WITH ANESTHESIA;  Surgeon: Consuella Lose, MD;  Location: [place];  Service: Radiology;  Laterality: N/A;  . TUBAL LIGATION  1983   Past Medical History:  Diagnosis Date  . 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 Anxiety   . Arthritis   . Cancer [facility])    breast cancer  . Cerebral hemorrhage ([place]) 1986  . Depression    due to cancer dx  . Diverticulosis   . GERD (gastroesophageal reflux disease)     . Headache   . History of hiatal hernia   . Hypertension   . 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 Neutropenic fever ([place])    Chemo related  . Osteonecrosis ([place]) 2017-2018   Right Jaw bone  . Seizures ([place])   . Stroke ([place])   .  Thrush, oral    Chemo related   BP 100/66   Pulse 92   Ht 5' 4.5" (1.638 m)   Wt 197 lb (89.4 kg)   LMP 08/14/2010 (Exact Date)   SpO2 95%   BMI 33.29 kg/m   Opioid Risk Score:   Fall Risk Score:  `1  Depression screen PHQ 2/9  Depression screen PHQ 2/9 01/07/2018  Decreased Interest 0  Down, Depressed, Hopeless 0  PHQ - 2 Score 0     Review of Systems  Constitutional: Negative.   HENT: Negative.   Eyes: Negative.   Respiratory: Negative.   Cardiovascular: Negative.   Gastrointestinal: Negative.   Endocrine: Negative.   Genitourinary: Negative.   Musculoskeletal: Positive for gait problem.  Skin: Negative.   Allergic/Immunologic: Negative.   Hematological: Negative.   Psychiatric/Behavioral: Negative.        Objective:   Physical Exam Constitutional: Well-developed.  NAD.  Obese. HENT: Normocephalic.  Atraumatic. Eyes: EOMI.  No discharge. Cardiovascular: RRR. No JVD. Respiratory: Effort normal and breath sounds normal.  GI: She exhibits no distension.  Bowel sounds normal. Musculoskeletal: No edema or tenderness in extremities. Neurological:  Alert.  Follows full commands.  Some delays with processing  Motor: RUE/RLE 4+/5, stable.  LUE/LLE: 4+/5, stable Skin: Skin is warm.  Intact. Psychiatric: Pleasant and cooperative     Assessment & Plan:  62 year old right-handed female with past medical history of progressive stage IV breast cancer presents for transitional care management after receiving CIR for aneurysmal bleed/SAH.    1. Right side weakness, dysphagia secondary to aneurysmal bleed/SAH   Cont therapies  Follow up with Neurosurgy - needs appointment  2. Pain Management:   Controlled  3. Stage IV breast cancer.    Cont follow up with Oncology  4. Dysphagia.   Advanced to regular diet by SLP  5. Hypertension.   Cont meds  6. Seizure prophylaxis.   Cont meds per Neurology  7. Gait abnormality  Cont rollator for safety  Meds reviewed Referrals reviewed All questions answered

## 2018-05-06 NOTE — Progress Notes (Signed)
[facility] [address] [place]. [place] [address] [phone]       OFFICE CONSULT NOTE  Diane. Diane Short Date of Birth:  10/19/1955 Medical Record Number:  [id number]   Referring MD:  Jethro Bolus, PA-c  Reason for Referral: Seizures and encephalopathy  HPI: Diane Short is a 62 year old Caucasian lady who was seen today for initial office consultation visit for encephalopathy of confusion.  She is accompanied by her husband.  History is obtained from them, review of electronic medical records and I have personally reviewed imaging films.  She has a complicated past medical history of stage IV breast cancer treated with chemotherapy palliative management as well as recent radiation and stereotactic surgery for single solitary brain metastasis and history of 5 intracerebral aneurysms 3 of which have been treated endovascularly.  She developed progressive headaches as well as altered mental status and was taken by EMS on 03/22/2018 to [facility] for an unresponsive episode which required intubation.  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 Kathyrn Sheriff.  She had prolonged intubation and subsequently was found to have some involuntary movements and jerking which were felt to be seizures.  She was initially started on Dilantin and then Keppra for seizures.  By Dr. Cheral Marker on 9/20//19 with subsequent follow-up by Dr. Kathrynn Speed felt that the diagnosis of seizure was not so clear.  In fact he felt Keppra may be contributing to patient's lethargy and encephalopathy.  However she had EEG done on 04/08/2018 which suggested some sharp waves and irritability in the right temporal region at T4 suggesting epileptogenic potential but overnight  prolonged EEG with video monitoring on 04/09/2018 did not show definite seizure activity.  Patient subsequently had some confusion and it was unclear whether Keppra was contributing.  The patient however made gradual improvement and was extubated and went to rehab.  She is done well and is currently at home.  She is almost back to baseline but feels cognitively she is slow but she is unsure whether this is not her baseline from prior chemotherapy fog.  She has had no definite documented seizures.  She is able to walk with a wheeled walker and is presently participating in therapies.  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  ROS:   14 system review of systems is positive for blurred vision, gait imbalance, tingling and numbness and all other systems negative  PMH:  Past Medical History:  Diagnosis Date  . 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 Anxiety   . Arthritis   . Cancer [facility])    breast cancer  . Cerebral hemorrhage ([place]) 1986  . Depression    due to cancer dx  . Diverticulosis   . GERD (gastroesophageal reflux disease)   . Headache   . History of hiatal hernia   . Hypertension   . 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 Neutropenic fever ([place])    Chemo related  . Osteonecrosis ([place]) 2017-2018   Right Jaw bone  . Seizures ([place])   . Stroke ([place])   . Thrush, oral    Chemo related    Social History:  Social History   Socioeconomic History  . Marital status: Married  Spouse name: Not on file  . Number of children: Not on file  . Years of education: Not on file  . Highest education level: Not on file  Occupational History  . Not on file  Social Needs  . Financial resource strain: Not on file  . Food insecurity:    Worry: Not on file    Inability: Not on file  . Transportation needs:    Medical: Not on file    Non-medical: Not on file  Tobacco Use  . Smoking status: Former Smoker    Packs/day: 0.50    Years: 42.00    Pack  years: 21.00    Types: Cigarettes  . Smokeless tobacco: Never Used  Substance and Sexual Activity  . Alcohol use: No  . Drug use: No  . Sexual activity: Not on file  Lifestyle  . Physical activity:    Days per week: Not on file    Minutes per session: Not on file  . Stress: Not on file  Relationships  . Social connections:    Talks on phone: Not on file    Gets together: Not on file    Attends religious service: Not on file    Active member of club or organization: Not on file    Attends meetings of clubs or organizations: Not on file    Relationship status: Not on file  . Intimate partner violence:    Fear of current or ex partner: No    Emotionally abused: No    Physically abused: No    Forced sexual activity: No  Other Topics Concern  . Not on file  Social History Narrative   Reside with spouse     Medications:   Current Outpatient Medications on File Prior to Visit  Medication Sig Dispense Refill  . acetaminophen (TYLENOL) 325 MG tablet Take 1-2 tablets (325-650 mg total) by mouth every 4 (four) hours as needed for mild pain.    Marland Kitchen amLODipine (NORVASC) 10 MG tablet Take 1 tablet (10 mg total) by mouth daily. 30 tablet 1  . Calcium Citrate-Vitamin D (CALCIUM CITRATE + D3 PO) Take 1-2 tablets by mouth 2 (two) times daily. Takes 2 in the am and 1 in the evening    . carvedilol (COREG) 25 MG tablet Take 1 tablet (25 mg total) by mouth 2 (two) times daily with a meal. 60 tablet 1  . Cholecalciferol (VITAMIN D3) 400 units CAPS Take by mouth.    . citalopram (CELEXA) 20 MG tablet Take 20 mg by mouth daily.    . famotidine (PEPCID) 20 MG tablet Take 1 tablet (20 mg total) by mouth daily. 30 tablet 1  . Hydrocortisone (GERHARDT'S BUTT CREAM) CREA Apply 1 application topically 4 (four) times daily. 1 each 1  . labetalol (NORMODYNE) 100 MG tablet Take 1 tablet (100 mg total) by mouth 3 (three) times daily. 90 tablet 1  . labetalol (NORMODYNE) 100 MG tablet Take by mouth.    .  loperamide (IMODIUM A-D) 2 MG tablet Take 2-4 mg by mouth daily as needed for diarrhea or loose stools.    . phenytoin (DILANTIN) 100 MG ER capsule Take by mouth.    . phenytoin (DILANTIN) 300 MG ER capsule Take 1 capsule (300 mg total) by mouth at bedtime. 30 capsule 1  . QUEtiapine (SEROQUEL) 25 MG tablet Take 1 tablet (25 mg total) by mouth at bedtime. 30 tablet 0   No current facility-administered medications on file prior to visit.  Allergies:   Allergies  Allergen Reactions  . Nsaids Other (See Comments)    GI upset   . Ace Inhibitors Other (See Comments)    cough    Physical Exam General: well developed, well nourished middle aged Caucasian lady, seated, in no evident distress Head: head normocephalic and atraumatic.   Neck: supple with no carotid or supraclavicular bruits Cardiovascular: regular rate and rhythm, no murmurs Musculoskeletal: no deformity Skin:  no rash/petichiae Vascular:  Normal pulses all extremities  Neurologic Exam Mental Status: Awake and fully alert. Oriented to place and time. Recent and remote memory intact. Attention span, concentration and fund of knowledge appropriate. Mood and affect appropriate. Recall 3/3. AFT 12. Clock drawing 4/4. Cranial Nerves: Fundoscopic exam reveals sharp disc margins. Pupils equal, briskly reactive to light. Extraocular movements full without nystagmus. Visual fields full to confrontation. Hearing intact. Facial sensation intact. Face, tongue, palate moves normally and symmetrically.  Motor: Normal bulk and tone. Normal strength in all tested extremity muscles except bilateral ankle dorsiflexor weakness..mild fine action tremors b/l UE Sensory.: intact to touch , pinprick , position sense but diminished vibratory sensation over. Toes bilaterally Coordination: Rapid alternating movements normal in all extremities. Finger-to-nose and heel-to-shin performed accurately bilaterally. Gait and Station: Arises from chair without  difficulty. Stance is broadbased. Uses a wheeled walker. Mild footdrop bilaterally  Gait demonstrates broad base and mild imbalance balance .   Reflexes: 1+ and symmetric except both ankle jerks are absent. Toes downgoing.       ASSESSMENT: 62 year old Caucasian lady with recent episode of encephalopathy and delirium following admission for subarachnoid hemorrhage which appears to have improved.  She was started on empirical anticonvulsants which could now be tapered and discontinued.  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  She is doing quite well and I recommend we check an EEG and start tapering Keppra to 375 mg twice daily for 2 months and then stop.  Continue Dilantin 300 mg daily for now.  She will continue follow-up for her cerebral aneurysms with Dr. Kathyrn Sheriff neurosurgeon.  Greater than 50% time during this 45-minute consultation visit was spent on counseling and coordination of care about her encephalopathy, seizures, cerebral aneurysms and subarachnoid hemorrhage and answering questions.  She will return for follow-up with my nurse practitioner in 2 months or call earlier if necessary. Antony Contras, MD  [facility] [address] [place], [place] [address]  Phone [phone] Fax [phone] Note: This document was prepared with digital dictation and possible smart phrase technology. Any transcriptional errors that result from this process are unintentional.

## 2018-05-06 NOTE — Telephone Encounter (Signed)
Rn spoke with pharmacy and stated pt is to take half of 750mg  which is 375mg . Pt will take 375 twice a day,and than be taper off. Pt will see NP in 2 months.

## 2018-05-19 ENCOUNTER — Ambulatory Visit: Payer: Medicaid Other | Admitting: Neurology

## 2018-05-19 DIAGNOSIS — G939 Disorder of brain, unspecified: Secondary | ICD-10-CM

## 2018-05-19 DIAGNOSIS — R2990 Unspecified symptoms and signs involving the nervous system: Secondary | ICD-10-CM

## 2018-05-24 ENCOUNTER — Telehealth: Payer: Self-pay

## 2018-05-24 NOTE — Telephone Encounter (Signed)
Rn call patient that the EEG was normal. PT verbalized understanding. Rn stated per DR.Leonie Man note to taper keppra 375 directions as prescribed for 2 months and than stop. Continue the dilantin. Pt verbalized understanding.

## 2018-05-24 NOTE — Telephone Encounter (Signed)
Pt returning RN's call.

## 2018-05-24 NOTE — Telephone Encounter (Signed)
-----   Message from Garvin Fila, MD sent at 05/21/2018 11:19 AM EST ----- Kindly inform patient that EEG study was normal

## 2018-05-24 NOTE — Telephone Encounter (Signed)
Notes recorded by Marval Regal, RN on 05/24/2018 at 8:45 AM EST LEft vm for patient to call back about EEG results. ------

## 2018-05-28 DIAGNOSIS — Z7982 Long term (current) use of aspirin: Secondary | ICD-10-CM

## 2018-05-28 DIAGNOSIS — Z923 Personal history of irradiation: Secondary | ICD-10-CM

## 2018-05-28 DIAGNOSIS — D6489 Other specified anemias: Secondary | ICD-10-CM

## 2018-05-28 DIAGNOSIS — Z79899 Other long term (current) drug therapy: Secondary | ICD-10-CM

## 2018-05-28 DIAGNOSIS — C50812 Malignant neoplasm of overlapping sites of left female breast: Secondary | ICD-10-CM

## 2018-05-28 DIAGNOSIS — G620 Drug-induced polyneuropathy: Secondary | ICD-10-CM

## 2018-05-28 DIAGNOSIS — C771 Secondary and unspecified malignant neoplasm of intrathoracic lymph nodes: Secondary | ICD-10-CM | POA: Diagnosis not present

## 2018-05-28 DIAGNOSIS — C7951 Secondary malignant neoplasm of bone: Secondary | ICD-10-CM

## 2018-05-28 DIAGNOSIS — C7931 Secondary malignant neoplasm of brain: Secondary | ICD-10-CM

## 2018-05-28 DIAGNOSIS — Z9221 Personal history of antineoplastic chemotherapy: Secondary | ICD-10-CM

## 2018-05-28 DIAGNOSIS — I890 Lymphedema, not elsewhere classified: Secondary | ICD-10-CM

## 2018-06-17 ENCOUNTER — Other Ambulatory Visit: Payer: Self-pay | Admitting: *Deleted

## 2018-06-17 DIAGNOSIS — C7931 Secondary malignant neoplasm of brain: Secondary | ICD-10-CM

## 2018-07-01 ENCOUNTER — Encounter: Payer: Medicaid Other | Admitting: Physical Medicine & Rehabilitation

## 2018-07-05 ENCOUNTER — Encounter: Payer: Self-pay | Admitting: Adult Health

## 2018-07-05 ENCOUNTER — Ambulatory Visit: Payer: Medicaid Other | Admitting: Adult Health

## 2018-07-05 VITALS — BP 97/70 | HR 77 | Ht 64.0 in | Wt 188.0 lb

## 2018-07-05 DIAGNOSIS — I609 Nontraumatic subarachnoid hemorrhage, unspecified: Secondary | ICD-10-CM

## 2018-07-05 DIAGNOSIS — G620 Drug-induced polyneuropathy: Secondary | ICD-10-CM | POA: Diagnosis not present

## 2018-07-05 DIAGNOSIS — T451X5A Adverse effect of antineoplastic and immunosuppressive drugs, initial encounter: Secondary | ICD-10-CM

## 2018-07-05 DIAGNOSIS — G40909 Epilepsy, unspecified, not intractable, without status epilepticus: Secondary | ICD-10-CM | POA: Diagnosis not present

## 2018-07-05 NOTE — Patient Instructions (Addendum)
Your Plan:  Continue dilantin 300mg  nightly at this time. We can consider discontinuing dilantin at follow up  Completely stop keppra at this time  Continue to follow up with neuro surgery and oncology as scheduled   Follow up in 4 months or call earlier if needed      Thank you for coming to see [place] at [facility]. I hope we have been able to provide you high quality care today.  You may receive a patient satisfaction survey over the next few weeks. We would appreciate your feedback and comments so that we may continue to improve ourselves and the health of our patients.

## 2018-07-05 NOTE — Progress Notes (Signed)
[facility] [address] [place]. [place] [address] [phone]       OFFICE CONSULT NOTE  Ms. Myalee Stengel Date of Birth:  1956/05/05 Medical Record Number:  [id number]   Referring MD:  Jethro Bolus, PA-c  Reason for Referral: Seizures and encephalopathy  Chief Complaint  Patient presents with  . Follow-up    Follow up for Seizures and encephalopathy room 9 pt with [name] pt is seeing a retina specialist for her eyes, Pt sees oncologist and PCP     HPI initial visit 05/06/2018: Ms Gimpel is a 62 year old Caucasian lady who was seen today for initial office consultation visit for encephalopathy of confusion.  She is accompanied by her husband.  History is obtained from them, review of electronic medical records and I have personally reviewed imaging films.  She has a complicated past medical history of stage IV breast cancer treated with chemotherapy palliative management as well as recent radiation and stereotactic surgery for single solitary brain metastasis and history of 5 intracerebral aneurysms 3 of which have been treated endovascularly.  She developed progressive headaches as well as altered mental status and was taken by EMS on 03/22/2018 to [facility] for an unresponsive episode which required intubation.  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 Kathyrn Sheriff.  She had prolonged intubation and subsequently was found to have some involuntary movements and jerking which were felt to be seizures.  She was initially started on Dilantin and then Keppra for seizures.  By Dr. Cheral Marker on 9/20//19 with subsequent follow-up by Dr. Kathrynn Speed felt that the diagnosis of seizure was not so clear.  In fact he felt Keppra may be  contributing to patient's lethargy and encephalopathy.  However she had EEG done on 04/08/2018 which suggested some sharp waves and irritability in the right temporal region at T4 suggesting epileptogenic potential but overnight prolonged EEG with video monitoring on 04/09/2018 did not show definite seizure activity.  Patient subsequently had some confusion and it was unclear whether Keppra was contributing.  The patient however made gradual improvement and was extubated and went to rehab.  She is done well and is currently at home.  She is almost back to baseline but feels cognitively she is slow but she is unsure whether this is not her baseline from prior chemotherapy fog.  She has had no definite documented seizures.  She is able to walk with a wheeled walker and is presently participating in therapies.  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  Interval history 07/05/2018: Patient is being seen today for 56-month follow-up and is accompanied by her husband.  Patient has decreased her Keppra dosage as recommended and is currently taking 375 mg twice daily.  She denies any additional seizure activity or symptoms.  She did have repeat EEG which was normal without any noted abnormalities.  She continues on Dilantin 300 mg nightly and is tolerating well without side effects.  Blood pressure today 97/70 which appears to her typical readings and provider appointments but she does monitor at home and typical SBP 120-130 and does plan on bringing her home blood pressure cuff to her oncology appointment on 07/19/2018 to ensure she is obtaining correct readings.  She has  MRI brain scheduled on 07/15/2018 and follow-up appointment with radiation oncology on 07/19/2018 and per patient, they will discuss possibly restarting chemotherapy.  She does have an appointment scheduled with neurosurgery in 07/2018.  She continues to use a rolling walker at all times due to  intermittent orthostatic hypotension episodes along with continued balance difficulties but does state this has been greatly improving.  No further concerns at this time.  Denies new or worsening stroke/TIA symptoms or seizure activity.      ROS:   14 system review of systems is positive for walking difficulty and seizures and all other systems negative  PMH:  Past Medical History:  Diagnosis Date  . 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 Anxiety   . Arthritis   . Cancer [facility])    breast cancer  . Cerebral hemorrhage ([place]) 1986  . Depression    due to cancer dx  . Diverticulosis   . GERD (gastroesophageal reflux disease)   . Headache   . History of hiatal hernia   . Hypertension   . 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 Neutropenic fever ([place])    Chemo related  . Osteonecrosis ([place]) 2017-2018   Right Jaw bone  . Seizures ([place])   . Stroke ([place])   . Thrush, oral    Chemo related    Social History:  Social History   Socioeconomic History  . Marital status: Married    Spouse name: Not on file  . Number of children: Not on file  . Years of education: Not on file  . Highest education level: Not on file  Occupational History  . Not on file  Social Needs  . Financial resource strain: Not on file  . Food insecurity:    Worry: Not on file    Inability: Not on file  . Transportation needs:    Medical: Not on file    Non-medical: Not on file  Tobacco Use  . Smoking status: Former Smoker    Packs/day: 0.50    Years: 42.00    Pack years: 21.00    Types: Cigarettes  . Smokeless tobacco: Never Used  Substance and Sexual Activity  . Alcohol use: No  . Drug use: No  . Sexual activity: Not on file  Lifestyle  . Physical activity:    Days per week: Not on file    Minutes per session: Not on file  . Stress: Not on file  Relationships  . Social connections:    Talks on phone: Not on file    Gets together: Not on file    Attends religious service: Not on file    Active member of club  or organization: Not on file    Attends meetings of clubs or organizations: Not on file    Relationship status: Not on file  . Intimate partner violence:    Fear of current or ex partner: No    Emotionally abused: No    Physically abused: No    Forced sexual activity: No  Other Topics Concern  . Not on file  Social History Narrative   Reside with spouse     Medications:   Current Outpatient Medications on File Prior to Visit  Medication Sig Dispense Refill  . acetaminophen (TYLENOL) 325 MG tablet Take 1-2 tablets (325-650 mg total) by mouth every 4 (four) hours as needed for mild pain.    Marland Kitchen amLODipine (NORVASC) 10 MG tablet Take 1 tablet (10 mg total) by  mouth daily. 30 tablet 1  . Calcium Citrate-Vitamin D (CALCIUM CITRATE + D3 PO) Take 1-2 tablets by mouth 2 (two) times daily. Takes 2 in the am and 1 in the evening    . carvedilol (COREG) 25 MG tablet Take 1 tablet (25 mg total) by mouth 2 (two) times daily with a meal. 60 tablet 1  . Cholecalciferol (VITAMIN D3) 400 units CAPS Take by mouth.    . citalopram (CELEXA) 20 MG tablet Take 20 mg by mouth daily.    . famotidine (PEPCID) 20 MG tablet Take 1 tablet (20 mg total) by mouth daily. 30 tablet 1  . labetalol (NORMODYNE) 100 MG tablet Take 1 tablet (100 mg total) by mouth 3 (three) times daily. (Patient taking differently: Take 100 mg by mouth 3 (three) times daily. Take 1/2 tablet three times a day) 90 tablet 1  . loperamide (IMODIUM A-D) 2 MG tablet Take 2-4 mg by mouth daily as needed for diarrhea or loose stools.    Marland Kitchen omeprazole (PRILOSEC) 20 MG capsule Take 20 mg by mouth 2 (two) times daily before a meal.    . ondansetron (ZOFRAN) 4 MG tablet Take 4 mg by mouth every 4 (four) hours as needed.  5  . phenytoin (DILANTIN) 300 MG ER capsule Take 1 capsule (300 mg total) by mouth at bedtime. 30 capsule 1  . QUEtiapine (SEROQUEL) 25 MG tablet Take 1 tablet (25 mg total) by mouth at bedtime. 30 tablet 0   No current  facility-administered medications on file prior to visit.     Allergies:   Allergies  Allergen Reactions  . Nsaids Other (See Comments)    GI upset   . Ace Inhibitors Other (See Comments)    cough   Blood pressure 97/70, pulse 77, height 5\' 4"  (1.626 m), weight 188 lb (85.3 kg), last menstrual period 08/14/2010.  Physical Exam General: well developed, well nourished obese middle aged Caucasian lady, seated, in no evident distress Head: head normocephalic and atraumatic.   Neck: supple with no carotid or supraclavicular bruits Cardiovascular: regular rate and rhythm, no murmurs Musculoskeletal: no deformity Skin:  no rash/petichiae Vascular:  Normal pulses all extremities  Neurologic Exam Mental Status: Awake and fully alert. Oriented to place and time. Recent and remote memory intact. Attention span, concentration and fund of knowledge appropriate. Mood and affect appropriate. Cranial Nerves: Pupils equal, briskly reactive to light. Extraocular movements full without nystagmus. Visual fields full to confrontation. Hearing intact. Facial sensation intact. Face, tongue, palate moves normally and symmetrically.  Motor: Normal bulk and tone. Normal strength in all tested extremity muscles except bilateral ankle dorsiflexor weakness..mild fine action tremors b/l UE Sensory.: intact to touch , pinprick , position sense but diminished vibratory sensation over. Toes bilaterally Coordination: Rapid alternating movements normal in all extremities. Finger-to-nose and heel-to-shin performed accurately bilaterally. Gait and Station: Arises from chair without difficulty. Stance is broadbased. Uses a wheeled walker. Mild footdrop bilaterally  Gait demonstrates broad base.   Reflexes: 1+ and symmetric. Toes downgoing.       ASSESSMENT: 61 year old Caucasian lady with recent episode of encephalopathy and delirium following admission for subarachnoid hemorrhage which appears to have improved.  She  was started on empirical anticonvulsants which could now be tapered and discontinued.  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  Patient is being seen today for 58-month follow-up visit and overall has been doing well without any recurrent seizure activity.    PLAN: -  Recommend to discontinue Keppra at this time -Continue Dilantin 30 mg nightly for continued seizure prophylaxis -can potentially consider tapering Dilantin at follow-up appointment -f/u with neurosurgery and oncology as scheduled -f/u with PCP for continued HTN management -Advised patient to notify us if she has any additional seizure-like activity as she is discontinuing her Keppra  Follow-up in 4 months or call earlier if needed with questions, concerns or need of sooner follow-up appointment   Greater than 50% time during this 45-minute consultation visit was spent on counseling and coordination of care about her encephalopathy, seizures, cerebral aneurysms and subarachnoid hemorrhage and answering questions.     Venancio Poisson, AGNP-BC  [facility] [address] [place], [place] [address]  Phone [phone] Fax [phone] Note: This document was prepared with digital dictation and possible smart phrase technology. Any transcriptional errors that result from this process are unintentional.

## 2018-07-12 ENCOUNTER — Other Ambulatory Visit: Payer: Self-pay | Admitting: Radiation Therapy

## 2018-07-15 ENCOUNTER — Ambulatory Visit
Admission: RE | Admit: 2018-07-15 | Discharge: 2018-07-15 | Disposition: A | Discharge status: Home / Self Care | Payer: Medicaid Other | Source: Ambulatory Visit | Attending: Radiation Oncology | Admitting: Radiation Oncology

## 2018-07-15 ENCOUNTER — Other Ambulatory Visit: Payer: Self-pay

## 2018-07-15 DIAGNOSIS — C7931 Secondary malignant neoplasm of brain: Secondary | ICD-10-CM

## 2018-07-16 ENCOUNTER — Ambulatory Visit
Admission: RE | Admit: 2018-07-16 | Discharge: 2018-07-16 | Disposition: A | Discharge status: Home / Self Care | Payer: Medicaid Other | Source: Ambulatory Visit | Attending: Radiation Oncology | Admitting: Radiation Oncology

## 2018-07-16 MED ORDER — GADOBENATE DIMEGLUMINE 529 MG/ML IV SOLN
18.0000 mL | Freq: Once | INTRAVENOUS | Status: AC | PRN
  Administered 2018-07-16: 18 mL via INTRAVENOUS

## 2018-07-18 NOTE — Progress Notes (Signed)
I agree with the above plan 

## 2018-07-19 ENCOUNTER — Other Ambulatory Visit: Payer: Self-pay

## 2018-07-19 ENCOUNTER — Encounter: Payer: Self-pay | Admitting: Radiation Oncology

## 2018-07-19 ENCOUNTER — Inpatient Hospital Stay: Payer: Medicaid Other | Attending: Radiation Oncology

## 2018-07-19 ENCOUNTER — Ambulatory Visit
Admission: RE | Admit: 2018-07-19 | Discharge: 2018-07-19 | Disposition: A | Discharge status: Home / Self Care | Payer: Medicaid Other | Source: Ambulatory Visit | Attending: Radiation Oncology | Admitting: Radiation Oncology

## 2018-07-19 VITALS — BP 116/76 | HR 79 | Temp 98.0°F | Resp 18 | Wt 189.0 lb

## 2018-07-19 DIAGNOSIS — C50912 Malignant neoplasm of unspecified site of left female breast: Secondary | ICD-10-CM | POA: Diagnosis present

## 2018-07-19 DIAGNOSIS — Z87891 Personal history of nicotine dependence: Secondary | ICD-10-CM | POA: Diagnosis not present

## 2018-07-19 DIAGNOSIS — Z923 Personal history of irradiation: Secondary | ICD-10-CM | POA: Diagnosis not present

## 2018-07-19 DIAGNOSIS — C7931 Secondary malignant neoplasm of brain: Secondary | ICD-10-CM | POA: Diagnosis not present

## 2018-07-19 DIAGNOSIS — I671 Cerebral aneurysm, nonruptured: Secondary | ICD-10-CM | POA: Diagnosis not present

## 2018-07-19 DIAGNOSIS — C7951 Secondary malignant neoplasm of bone: Secondary | ICD-10-CM | POA: Diagnosis not present

## 2018-07-19 DIAGNOSIS — G629 Polyneuropathy, unspecified: Secondary | ICD-10-CM | POA: Insufficient documentation

## 2018-07-19 DIAGNOSIS — Z171 Estrogen receptor negative status [ER-]: Secondary | ICD-10-CM

## 2018-07-19 NOTE — Progress Notes (Signed)
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  The patient tolerated her procedure well, she was given taper instructions for  dexamethasone following this but developed some rebound symptoms of headache which required slower dexamethasone taper. She has had a prolonged course in the hospital recently after an aneurysmal bleed and subarachnoid hemorrhage. She has been on a break from chemotherapy for several months. She has been recovering well since her October 2019 hospitalization and went for routine surveillance MRI on 07/16/2018. Her case was discussed in conference and her left frontal site was felt to be stable and recommendations were to repeat studies in 3 months.                      On review of systems, the patient states she is doing very well. She denies any headaches, speech changes, tremor or seizure like activity. She denies any progressive visual changes but is seeing a retinal specialist due to some vascular changes in her right and left eye. She feels that her symptoms of seeing a dark spot in her right visual field is improved, and denies any trouble with vision on the left. She denies any body pain. She has felt some congestion and pressure in her left ear at times and is able to clear this with valsalva. No other complaints are verbalized.   Past Medical History:  Past Medical History:  Diagnosis Date  . 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 Anxiety   . Arthritis   . Cancer ([facility]    breast  cancer  . Cerebral hemorrhage ([facility] 1986  . Depression    due to cancer dx  . Diverticulosis   . GERD (gastroesophageal reflux disease)   . Headache   . History of hiatal hernia   . Hypertension   . 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 Neutropenic fever ([facility]    Chemo related  . [facility] [place]   Right Jaw bone  . Seizures ([facility]   . Stroke ([facility]   . Thrush, oral    Chemo related    Past Surgical History: Past Surgical History:  Procedure Laterality Date  . BREAST SURGERY Left 11/2016   metastic to bone  . CATARACT EXTRACTION Right 03/02/2017  . CEREBRAL ANGIOGRAM  1986  . CESAREAN SECTION  1980  . IR 3D INDEPENDENT WKST  01/20/2017  . IR 3D INDEPENDENT WKST  05/26/2017  . IR ANGIO INTRA EXTRACRAN SEL COM CAROTID INNOMINATE UNI R MOD SED  04/09/2017  . IR ANGIO INTRA EXTRACRAN SEL INTERNAL CAROTID BILAT MOD SED  01/20/2017  . IR ANGIO INTRA EXTRACRAN SEL INTERNAL CAROTID BILAT MOD SED  11/17/2017  . IR ANGIO INTRA EXTRACRAN SEL INTERNAL CAROTID BILAT MOD SED  03/23/2018  . IR ANGIO INTRA EXTRACRAN SEL INTERNAL CAROTID UNI R MOD SED  03/13/2017  . IR ANGIO VERTEBRAL SEL VERTEBRAL BILAT MOD SED  01/20/2017  . IR ANGIO VERTEBRAL SEL VERTEBRAL  UNI L MOD SED  05/26/2017  . IR ANGIO VERTEBRAL SEL VERTEBRAL UNI L MOD SED  11/17/2017  . IR ANGIOGRAM FOLLOW UP STUDY  03/13/2017  . IR ANGIOGRAM FOLLOW UP STUDY  03/13/2017  . IR ANGIOGRAM FOLLOW UP STUDY  03/13/2017  . IR ANGIOGRAM FOLLOW UP STUDY  03/13/2017  . IR ANGIOGRAM FOLLOW UP STUDY  03/13/2017  . IR ANGIOGRAM FOLLOW UP STUDY  03/13/2017  . IR ANGIOGRAM FOLLOW UP STUDY  03/13/2017  . IR ANGIOGRAM FOLLOW UP STUDY  03/13/2017  . IR ANGIOGRAM FOLLOW UP STUDY  03/13/2017  . IR ANGIOGRAM FOLLOW UP STUDY  03/13/2017  . IR ANGIOGRAM FOLLOW UP STUDY  03/13/2017  . IR ANGIOGRAM FOLLOW UP STUDY  03/13/2017  . IR ANGIOGRAM FOLLOW UP STUDY  04/09/2017  . IR ANGIOGRAM FOLLOW UP STUDY  05/26/2017  . IR ANGIOGRAM FOLLOW UP STUDY  05/26/2017  . IR  ANGIOGRAM FOLLOW UP STUDY  03/23/2018  . IR NEURO EACH ADD'L AFTER BASIC UNI LEFT (MS)  04/09/2017  . IR NEURO EACH ADD'L AFTER BASIC UNI LEFT (MS)  03/23/2018  . IR NEURO EACH ADD'L AFTER BASIC UNI RIGHT (MS)  03/13/2017  . IR TRANSCATH/EMBOLIZ  03/13/2017  . IR TRANSCATH/EMBOLIZ  04/09/2017  . IR TRANSCATH/EMBOLIZ  05/26/2017  . IR TRANSCATH/EMBOLIZ  03/23/2018  . IR US GUIDE VASC ACCESS RIGHT  04/09/2017  . MASTECTOMY Left   . port a cathh  04/2016  . RADIOLOGY WITH ANESTHESIA N/A 03/13/2017   Procedure: stent coiling of right MCA aneurysm;  Surgeon: Consuella Lose, MD;  Location: [place];  Service: Radiology;  Laterality: N/A;  . RADIOLOGY WITH ANESTHESIA N/A 04/09/2017   Procedure: Arteriogram, pipeline embolization of aneruysm;  Surgeon: Consuella Lose, MD;  Location: [place];  Service: Radiology;  Laterality: N/A;  . RADIOLOGY WITH ANESTHESIA N/A 05/26/2017   Procedure: Stent supported coil embolization;  Surgeon: Consuella Lose, MD;  Location: [place];  Service: Radiology;  Laterality: N/A;  . RADIOLOGY WITH ANESTHESIA N/A 03/23/2018   Procedure: IR WITH ANESTHESIA;  Surgeon: Consuella Lose, MD;  Location: [place];  Service: Radiology;  Laterality: N/A;  . TUBAL LIGATION  1983    Social History:  Social History   Socioeconomic History  . Marital status: Married    Spouse name: Not on file  . Number of children: Not on file  . Years of education: Not on file  . Highest education level: Not on file  Occupational History  . Not on file  Social Needs  . Financial resource strain: Not on file  . Food insecurity:    Worry: Not on file    Inability: Not on file  . Transportation needs:    Medical: No    Non-medical: No  Tobacco Use  . Smoking status: Former Smoker    Packs/day: 0.50    Years: 42.00    Pack years: 21.00    Types: Cigarettes  . Smokeless tobacco: Never Used  Substance and Sexual Activity  . Alcohol use: No  . Drug use: No  . Sexual activity: Not on file   Lifestyle  . Physical activity:    Days per week: Not on file    Minutes per session: Not on file  . Stress: Not on file  Relationships  . Social connections:    Talks on phone: Not on file    Gets together: Not on file    Attends religious service: Not on file    Active member of  club or organization: Not on file    Attends meetings of clubs or organizations: Not on file    Relationship status: Not on file  . Intimate partner violence:    Fear of current or ex partner: No    Emotionally abused: No    Physically abused: No    Forced sexual activity: No  Other Topics Concern  . Not on file  Social History Narrative   Reside with spouse     Family History: Family History  Problem Relation Age of Onset  . Alzheimer's disease Mother   . Colon cancer Father   . Cerebral aneurysm Father   . Diabetes Sister   . Stomach cancer Brother   . Esophageal cancer Brother   . Multiple myeloma Brother   . Hypertension Sister      ALLERGIES:  is allergic to nsaids and ace inhibitors.  Meds: Current Outpatient Medications  Medication Sig Dispense Refill  . acetaminophen (TYLENOL) 325 MG tablet Take 1-2 tablets (325-650 mg total) by mouth every 4 (four) hours as needed for mild pain.    Marland Kitchen amLODipine (NORVASC) 10 MG tablet Take 1 tablet (10 mg total) by mouth daily. 30 tablet 1  . Calcium Citrate-Vitamin D (CALCIUM CITRATE + D3 PO) Take 1-2 tablets by mouth 2 (two) times daily. Takes 2 in the am and 1 in the evening    . carvedilol (COREG) 25 MG tablet Take 1 tablet (25 mg total) by mouth 2 (two) times daily with a meal. 60 tablet 1  . Cholecalciferol (VITAMIN D3) 400 units CAPS Take by mouth.    . citalopram (CELEXA) 20 MG tablet Take 20 mg by mouth daily.    Marland Kitchen labetalol (NORMODYNE) 100 MG tablet Take 1 tablet (100 mg total) by mouth 3 (three) times daily. (Patient taking differently: Take 100 mg by mouth 3 (three) times daily. Take 1/2 tablet three times a day) 90 tablet 1  .  loperamide (IMODIUM A-D) 2 MG tablet Take 2-4 mg by mouth daily as needed for diarrhea or loose stools.    Marland Kitchen omeprazole (PRILOSEC) 20 MG capsule Take 20 mg by mouth 2 (two) times daily before a meal.    . ondansetron (ZOFRAN) 4 MG tablet Take 4 mg by mouth every 4 (four) hours as needed.  5  . phenytoin (DILANTIN) 300 MG ER capsule Take 1 capsule (300 mg total) by mouth at bedtime. 30 capsule 1  . QUEtiapine (SEROQUEL) 25 MG tablet Take 1 tablet (25 mg total) by mouth at bedtime. 30 tablet 0  . famotidine (PEPCID) 20 MG tablet Take 1 tablet (20 mg total) by mouth daily. (Patient not taking: Reported on 07/19/2018) 30 tablet 1   No current facility-administered medications for this encounter.     Physical Findings:  weight is 189 lb (85.7 kg). Her oral temperature is 98 F (36.7 C). Her blood pressure is 116/76 and her pulse is 79. Her respiration is 18 and oxygen saturation is 98%.  Pain Assessment Pain Score: 2 /10 In general this is a well appearing caucasian female in no acute distress. She's alert and oriented x4 and appropriate throughout the examination. Cardiopulmonary assessment is negative for acute distress and she exhibits normal effort. She appears to be neurologically intact without focal changes.  Lab Findings: Lab Results  Component Value Date   WBC 10.8 (H) 04/19/2018   HGB 10.5 (L) 04/19/2018   HCT 34.5 (L) 04/19/2018   MCV 101.8 (H) 04/19/2018   PLT 216 04/19/2018  Radiographic Findings: Mr Jeri Cos Wo Contrast  Result Date: 07/16/2018 CLINICAL DATA:  History of breast cancer and brain and bone metastases. History of brain radiation and previously treated cerebral aneurysms. Creatinine was obtained on site at [facility] at [address] Results: Creatinine 0.7 mg/dL. EXAM: MRI HEAD WITHOUT AND WITH CONTRAST TECHNIQUE: Multiplanar, multiecho pulse sequences of the brain and surrounding structures were obtained without and with intravenous contrast.  CONTRAST:  65m MULTIHANCE GADOBENATE DIMEGLUMINE 529 MG/ML IV SOLN COMPARISON:  04/12/2018 and 02/26/2018 FINDINGS: Brain: There is no evidence of acute infarct, midline shift, or extra-axial fluid collection. A small amount of chronic blood products are present in the lateral ventricles less than on the most recent prior MRI. Patchy T2 hyperintensities in the cerebral white matter and pons are similar to the prior studies and nonspecific but compatible with mild-to-moderate chronic small vessel ischemic disease. Encephalomalacia and gliosis are again noted in the right temporal lobe adjacent to the treated aneurysm. Ring-enhancing lesion in the left postcentral gyrus has mildly enlarged compared to the 02/26/2018 MRI, now measuring 16 mm on coronal images (series 12, image 18, previously 12 mm) with mildly increased surrounding T2 hyperintensity which could reflect gliosis or edema. There is no associated mass effect. No new enhancing brain lesions are identified. Vascular: Previously treated right MCA and left PICA aneurysms. Similar appearance of small enhancing left paraophthalmic and anterior communicating enhancing aneurysms, more fully evaluated on 03/2018 CTA. Skull and upper cervical spine: Right pterional craniotomy. Extensive calvarial bone metastases, greatly increased from prior. Worsening destructive central skull base metastasis. Mild smooth dural thickening and enhancement diffusely over the right cerebral convexity, most notable in the frontal region, may be reactive and related to prior craniotomy and overlying bone metastases without a discrete dural or epidural mas. Sinuses/Orbits: Bilateral cataract extraction. Mild left sphenoid sinus mucosal thickening. Persistent large left and small right mastoid effusions. Other: None. IMPRESSION: 1. Mild enlargement of left parietal metastasis, possibly progressive post treatment changes. Close follow-up recommended to exclude recurrent tumor. 2. No  evidence of new brain metastases. 3. Progressive, widespread skull metastases. Enlargement of destructive central skull base tumor. 4. Small volume residual chronic blood products in the lateral ventricles, decreased from prior. Electronically Signed   By: ALogan BoresM.D.   On: 07/16/2018 14:17    Impression/Plan: 1. 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   The patient is doing well considering all of her recent hospitalization issues. We reviewed her MRI in conference which appears stable regarding her treated disease. She does have increasing disease in the skull which coincides with her chemotherapy break. She is seeing Dr. MHinton Raothis Wednesday to discuss restarting systemic therapy. We would recommend repeat imaging in 3 months to assess her brain. She is in agreement. She will let uKoreaknow if she has questions or concerns prior to that visit. 2. History of recurrent MCA aneurysm.  The patient continues to follow expectantly [facility]  I will coordinate with our navigator to make sure we have clarity on the frequency of angiography exams.    ACarola Rhine PAC

## 2018-07-20 NOTE — Progress Notes (Signed)
Brain and Spine Tumor Board Documentation  Diane Short was presented by Cecil Cobbs, MD at Brain and Spine Tumor Board on 07/20/2018, which included representatives from neuro oncology, radiation oncology, surgical oncology, radiology, pathology, navigation.  Diane Short was presented as a current patient with history of the following treatments:  .  Additionally, we reviewed previous medical and familial history, history of present illness, and recent lab results along with all available histopathologic and imaging studies. The tumor board considered available treatment options and made the following recommendations:  Active surveillance    Tumor board is a meeting of clinicians from various specialty areas who evaluate and discuss patients for whom a multidisciplinary approach is being considered. Final determinations in the plan of care are those of the provider(s). The responsibility for follow up of recommendations given during tumor board is that of the provider.   Today's extended care, comprehensive team conference, Diane Short was not present for the discussion and was not examined.

## 2018-07-21 DIAGNOSIS — Z79899 Other long term (current) drug therapy: Secondary | ICD-10-CM

## 2018-07-21 DIAGNOSIS — Z923 Personal history of irradiation: Secondary | ICD-10-CM

## 2018-07-21 DIAGNOSIS — C7931 Secondary malignant neoplasm of brain: Secondary | ICD-10-CM | POA: Diagnosis not present

## 2018-07-21 DIAGNOSIS — D649 Anemia, unspecified: Secondary | ICD-10-CM

## 2018-07-21 DIAGNOSIS — Z7982 Long term (current) use of aspirin: Secondary | ICD-10-CM

## 2018-07-21 DIAGNOSIS — C50812 Malignant neoplasm of overlapping sites of left female breast: Secondary | ICD-10-CM | POA: Diagnosis not present

## 2018-07-21 DIAGNOSIS — Z9221 Personal history of antineoplastic chemotherapy: Secondary | ICD-10-CM

## 2018-07-21 DIAGNOSIS — I890 Lymphedema, not elsewhere classified: Secondary | ICD-10-CM

## 2018-07-21 DIAGNOSIS — G620 Drug-induced polyneuropathy: Secondary | ICD-10-CM

## 2018-07-21 DIAGNOSIS — C771 Secondary and unspecified malignant neoplasm of intrathoracic lymph nodes: Secondary | ICD-10-CM

## 2018-07-21 DIAGNOSIS — C7951 Secondary malignant neoplasm of bone: Secondary | ICD-10-CM | POA: Diagnosis not present

## 2018-07-26 ENCOUNTER — Encounter: Payer: Self-pay | Admitting: Radiation Therapy

## 2018-07-26 NOTE — Progress Notes (Signed)
Per Alison's request, I spoke with Dr. Kathyrn Sheriff about the frequency of angiograms to be done for [facility]. He plans to do another in March and said that we can still plan to do the 3 mo follow-up MRI in early April.   I will reach out to his staff to make sure they are aware of his desire to do an angiogram in March.   Mont Dutton R.T.(R)(T) Special Procedures Navigator

## 2018-08-03 DIAGNOSIS — C771 Secondary and unspecified malignant neoplasm of intrathoracic lymph nodes: Secondary | ICD-10-CM | POA: Diagnosis not present

## 2018-08-03 DIAGNOSIS — C7931 Secondary malignant neoplasm of brain: Secondary | ICD-10-CM | POA: Diagnosis not present

## 2018-08-03 DIAGNOSIS — C7951 Secondary malignant neoplasm of bone: Secondary | ICD-10-CM

## 2018-08-03 DIAGNOSIS — C50812 Malignant neoplasm of overlapping sites of left female breast: Secondary | ICD-10-CM | POA: Diagnosis not present

## 2018-08-11 ENCOUNTER — Other Ambulatory Visit: Payer: Self-pay | Admitting: Radiation Therapy

## 2018-08-11 DIAGNOSIS — C7931 Secondary malignant neoplasm of brain: Secondary | ICD-10-CM

## 2018-08-11 DIAGNOSIS — C7949 Secondary malignant neoplasm of other parts of nervous system: Principal | ICD-10-CM

## 2018-08-13 DIAGNOSIS — I890 Lymphedema, not elsewhere classified: Secondary | ICD-10-CM

## 2018-08-13 DIAGNOSIS — C771 Secondary and unspecified malignant neoplasm of intrathoracic lymph nodes: Secondary | ICD-10-CM

## 2018-08-13 DIAGNOSIS — G620 Drug-induced polyneuropathy: Secondary | ICD-10-CM

## 2018-08-13 DIAGNOSIS — R55 Syncope and collapse: Secondary | ICD-10-CM

## 2018-08-13 DIAGNOSIS — C7931 Secondary malignant neoplasm of brain: Secondary | ICD-10-CM | POA: Diagnosis not present

## 2018-08-13 DIAGNOSIS — C7951 Secondary malignant neoplasm of bone: Secondary | ICD-10-CM

## 2018-08-13 DIAGNOSIS — I2699 Other pulmonary embolism without acute cor pulmonale: Secondary | ICD-10-CM

## 2018-08-13 DIAGNOSIS — D696 Thrombocytopenia, unspecified: Secondary | ICD-10-CM

## 2018-08-13 DIAGNOSIS — I82220 Acute embolism and thrombosis of inferior vena cava: Secondary | ICD-10-CM

## 2018-08-13 DIAGNOSIS — C50812 Malignant neoplasm of overlapping sites of left female breast: Secondary | ICD-10-CM

## 2018-08-13 DIAGNOSIS — D630 Anemia in neoplastic disease: Secondary | ICD-10-CM

## 2018-08-13 DIAGNOSIS — I959 Hypotension, unspecified: Secondary | ICD-10-CM

## 2018-08-16 DIAGNOSIS — C7951 Secondary malignant neoplasm of bone: Secondary | ICD-10-CM | POA: Diagnosis not present

## 2018-08-16 DIAGNOSIS — E538 Deficiency of other specified B group vitamins: Secondary | ICD-10-CM | POA: Diagnosis not present

## 2018-08-16 DIAGNOSIS — Z7982 Long term (current) use of aspirin: Secondary | ICD-10-CM | POA: Diagnosis not present

## 2018-08-16 DIAGNOSIS — D649 Anemia, unspecified: Secondary | ICD-10-CM | POA: Diagnosis not present

## 2018-08-16 DIAGNOSIS — R0781 Pleurodynia: Secondary | ICD-10-CM | POA: Diagnosis not present

## 2018-08-16 DIAGNOSIS — Z923 Personal history of irradiation: Secondary | ICD-10-CM | POA: Diagnosis not present

## 2018-08-16 DIAGNOSIS — D6959 Other secondary thrombocytopenia: Secondary | ICD-10-CM | POA: Diagnosis not present

## 2018-08-16 DIAGNOSIS — C771 Secondary and unspecified malignant neoplasm of intrathoracic lymph nodes: Secondary | ICD-10-CM | POA: Diagnosis not present

## 2018-08-16 DIAGNOSIS — C50812 Malignant neoplasm of overlapping sites of left female breast: Secondary | ICD-10-CM | POA: Diagnosis not present

## 2018-08-16 DIAGNOSIS — C7931 Secondary malignant neoplasm of brain: Secondary | ICD-10-CM | POA: Diagnosis not present

## 2018-08-16 DIAGNOSIS — Z9221 Personal history of antineoplastic chemotherapy: Secondary | ICD-10-CM

## 2018-08-16 DIAGNOSIS — Z79899 Other long term (current) drug therapy: Secondary | ICD-10-CM

## 2018-08-17 DIAGNOSIS — C50919 Malignant neoplasm of unspecified site of unspecified female breast: Secondary | ICD-10-CM | POA: Diagnosis not present

## 2018-08-17 DIAGNOSIS — C7951 Secondary malignant neoplasm of bone: Secondary | ICD-10-CM | POA: Diagnosis not present

## 2018-08-19 DIAGNOSIS — C7951 Secondary malignant neoplasm of bone: Secondary | ICD-10-CM | POA: Diagnosis not present

## 2018-08-19 DIAGNOSIS — C50812 Malignant neoplasm of overlapping sites of left female breast: Secondary | ICD-10-CM | POA: Diagnosis not present

## 2018-08-19 DIAGNOSIS — C778 Secondary and unspecified malignant neoplasm of lymph nodes of multiple regions: Secondary | ICD-10-CM | POA: Diagnosis not present

## 2018-08-24 DIAGNOSIS — C7951 Secondary malignant neoplasm of bone: Secondary | ICD-10-CM | POA: Diagnosis not present

## 2018-08-24 DIAGNOSIS — C801 Malignant (primary) neoplasm, unspecified: Secondary | ICD-10-CM | POA: Diagnosis not present

## 2018-08-24 DIAGNOSIS — C50812 Malignant neoplasm of overlapping sites of left female breast: Secondary | ICD-10-CM | POA: Diagnosis not present

## 2018-08-24 DIAGNOSIS — R300 Dysuria: Secondary | ICD-10-CM | POA: Diagnosis not present

## 2018-08-24 DIAGNOSIS — C778 Secondary and unspecified malignant neoplasm of lymph nodes of multiple regions: Secondary | ICD-10-CM | POA: Diagnosis not present

## 2018-08-24 DIAGNOSIS — Z515 Encounter for palliative care: Secondary | ICD-10-CM | POA: Diagnosis not present

## 2018-08-25 DIAGNOSIS — C50812 Malignant neoplasm of overlapping sites of left female breast: Secondary | ICD-10-CM | POA: Diagnosis not present

## 2018-08-25 DIAGNOSIS — C7951 Secondary malignant neoplasm of bone: Secondary | ICD-10-CM | POA: Diagnosis not present

## 2018-08-26 DIAGNOSIS — C50812 Malignant neoplasm of overlapping sites of left female breast: Secondary | ICD-10-CM | POA: Diagnosis not present

## 2018-08-26 DIAGNOSIS — C7951 Secondary malignant neoplasm of bone: Secondary | ICD-10-CM | POA: Diagnosis not present

## 2018-08-26 DIAGNOSIS — Z510 Encounter for antineoplastic radiation therapy: Secondary | ICD-10-CM | POA: Diagnosis not present

## 2018-08-27 DIAGNOSIS — C50812 Malignant neoplasm of overlapping sites of left female breast: Secondary | ICD-10-CM | POA: Diagnosis not present

## 2018-08-27 DIAGNOSIS — Z79899 Other long term (current) drug therapy: Secondary | ICD-10-CM | POA: Diagnosis not present

## 2018-08-27 DIAGNOSIS — R300 Dysuria: Secondary | ICD-10-CM | POA: Diagnosis not present

## 2018-08-27 DIAGNOSIS — C7951 Secondary malignant neoplasm of bone: Secondary | ICD-10-CM | POA: Diagnosis not present

## 2018-08-30 DIAGNOSIS — Z510 Encounter for antineoplastic radiation therapy: Secondary | ICD-10-CM | POA: Diagnosis not present

## 2018-08-30 DIAGNOSIS — C7951 Secondary malignant neoplasm of bone: Secondary | ICD-10-CM | POA: Diagnosis not present

## 2018-08-30 DIAGNOSIS — C50812 Malignant neoplasm of overlapping sites of left female breast: Secondary | ICD-10-CM | POA: Diagnosis not present

## 2018-08-31 DIAGNOSIS — C50812 Malignant neoplasm of overlapping sites of left female breast: Secondary | ICD-10-CM | POA: Diagnosis not present

## 2018-08-31 DIAGNOSIS — Z515 Encounter for palliative care: Secondary | ICD-10-CM | POA: Diagnosis not present

## 2018-08-31 DIAGNOSIS — C7951 Secondary malignant neoplasm of bone: Secondary | ICD-10-CM | POA: Diagnosis not present

## 2018-08-31 DIAGNOSIS — C778 Secondary and unspecified malignant neoplasm of lymph nodes of multiple regions: Secondary | ICD-10-CM | POA: Diagnosis not present

## 2018-09-01 DIAGNOSIS — Z515 Encounter for palliative care: Secondary | ICD-10-CM | POA: Diagnosis not present

## 2018-09-01 DIAGNOSIS — C7951 Secondary malignant neoplasm of bone: Secondary | ICD-10-CM | POA: Diagnosis not present

## 2018-09-01 DIAGNOSIS — C778 Secondary and unspecified malignant neoplasm of lymph nodes of multiple regions: Secondary | ICD-10-CM | POA: Diagnosis not present

## 2018-09-01 DIAGNOSIS — C50812 Malignant neoplasm of overlapping sites of left female breast: Secondary | ICD-10-CM | POA: Diagnosis not present

## 2018-09-01 DIAGNOSIS — Z510 Encounter for antineoplastic radiation therapy: Secondary | ICD-10-CM | POA: Diagnosis not present

## 2018-09-02 DIAGNOSIS — C778 Secondary and unspecified malignant neoplasm of lymph nodes of multiple regions: Secondary | ICD-10-CM | POA: Diagnosis not present

## 2018-09-02 DIAGNOSIS — Z515 Encounter for palliative care: Secondary | ICD-10-CM | POA: Diagnosis not present

## 2018-09-02 DIAGNOSIS — C7951 Secondary malignant neoplasm of bone: Secondary | ICD-10-CM | POA: Diagnosis not present

## 2018-09-02 DIAGNOSIS — Z510 Encounter for antineoplastic radiation therapy: Secondary | ICD-10-CM | POA: Diagnosis not present

## 2018-09-02 DIAGNOSIS — C50812 Malignant neoplasm of overlapping sites of left female breast: Secondary | ICD-10-CM | POA: Diagnosis not present

## 2018-09-06 DIAGNOSIS — Z510 Encounter for antineoplastic radiation therapy: Secondary | ICD-10-CM | POA: Diagnosis not present

## 2018-09-06 DIAGNOSIS — C50812 Malignant neoplasm of overlapping sites of left female breast: Secondary | ICD-10-CM | POA: Diagnosis not present

## 2018-09-06 DIAGNOSIS — C7951 Secondary malignant neoplasm of bone: Secondary | ICD-10-CM | POA: Diagnosis not present

## 2018-09-07 DIAGNOSIS — Z7901 Long term (current) use of anticoagulants: Secondary | ICD-10-CM | POA: Diagnosis not present

## 2018-09-07 DIAGNOSIS — C50812 Malignant neoplasm of overlapping sites of left female breast: Secondary | ICD-10-CM | POA: Diagnosis not present

## 2018-09-07 DIAGNOSIS — M25552 Pain in left hip: Secondary | ICD-10-CM | POA: Diagnosis not present

## 2018-09-07 DIAGNOSIS — C778 Secondary and unspecified malignant neoplasm of lymph nodes of multiple regions: Secondary | ICD-10-CM | POA: Diagnosis not present

## 2018-09-07 DIAGNOSIS — Z515 Encounter for palliative care: Secondary | ICD-10-CM | POA: Diagnosis not present

## 2018-09-07 DIAGNOSIS — I2699 Other pulmonary embolism without acute cor pulmonale: Secondary | ICD-10-CM | POA: Diagnosis not present

## 2018-09-07 DIAGNOSIS — C7931 Secondary malignant neoplasm of brain: Secondary | ICD-10-CM | POA: Diagnosis not present

## 2018-09-07 DIAGNOSIS — I82422 Acute embolism and thrombosis of left iliac vein: Secondary | ICD-10-CM | POA: Diagnosis not present

## 2018-09-07 DIAGNOSIS — I82220 Acute embolism and thrombosis of inferior vena cava: Secondary | ICD-10-CM | POA: Diagnosis not present

## 2018-09-07 DIAGNOSIS — M25551 Pain in right hip: Secondary | ICD-10-CM | POA: Diagnosis not present

## 2018-09-07 DIAGNOSIS — C7951 Secondary malignant neoplasm of bone: Secondary | ICD-10-CM | POA: Diagnosis not present

## 2018-09-08 DIAGNOSIS — C50812 Malignant neoplasm of overlapping sites of left female breast: Secondary | ICD-10-CM | POA: Diagnosis not present

## 2018-09-08 DIAGNOSIS — Z510 Encounter for antineoplastic radiation therapy: Secondary | ICD-10-CM | POA: Diagnosis not present

## 2018-09-08 DIAGNOSIS — C7951 Secondary malignant neoplasm of bone: Secondary | ICD-10-CM | POA: Diagnosis not present

## 2018-09-09 DIAGNOSIS — R29818 Other symptoms and signs involving the nervous system: Secondary | ICD-10-CM | POA: Diagnosis not present

## 2018-09-09 DIAGNOSIS — G459 Transient cerebral ischemic attack, unspecified: Secondary | ICD-10-CM | POA: Diagnosis not present

## 2018-09-09 DIAGNOSIS — H53132 Sudden visual loss, left eye: Secondary | ICD-10-CM | POA: Diagnosis not present

## 2018-09-09 DIAGNOSIS — C50919 Malignant neoplasm of unspecified site of unspecified female breast: Secondary | ICD-10-CM | POA: Diagnosis not present

## 2018-09-09 DIAGNOSIS — C7951 Secondary malignant neoplasm of bone: Secondary | ICD-10-CM | POA: Diagnosis not present

## 2018-09-13 DIAGNOSIS — N390 Urinary tract infection, site not specified: Secondary | ICD-10-CM | POA: Diagnosis not present

## 2018-09-13 DIAGNOSIS — C778 Secondary and unspecified malignant neoplasm of lymph nodes of multiple regions: Secondary | ICD-10-CM | POA: Diagnosis not present

## 2018-09-13 DIAGNOSIS — Z79899 Other long term (current) drug therapy: Secondary | ICD-10-CM | POA: Diagnosis not present

## 2018-09-13 DIAGNOSIS — C50812 Malignant neoplasm of overlapping sites of left female breast: Secondary | ICD-10-CM | POA: Diagnosis not present

## 2018-09-13 DIAGNOSIS — Z515 Encounter for palliative care: Secondary | ICD-10-CM | POA: Diagnosis not present

## 2018-09-22 ENCOUNTER — Other Ambulatory Visit (HOSPITAL_COMMUNITY): Payer: Self-pay | Admitting: Neurosurgery

## 2018-09-22 DIAGNOSIS — I6052 Nontraumatic subarachnoid hemorrhage from left vertebral artery: Secondary | ICD-10-CM

## 2018-09-27 ENCOUNTER — Other Ambulatory Visit: Payer: Self-pay | Admitting: Neurosurgery

## 2018-09-27 ENCOUNTER — Other Ambulatory Visit (HOSPITAL_COMMUNITY): Payer: Self-pay | Admitting: Neurosurgery

## 2018-09-27 ENCOUNTER — Other Ambulatory Visit: Payer: Self-pay

## 2018-09-27 DIAGNOSIS — C7931 Secondary malignant neoplasm of brain: Secondary | ICD-10-CM

## 2018-09-28 ENCOUNTER — Other Ambulatory Visit: Payer: Self-pay | Admitting: Neurosurgery

## 2018-10-13 DEATH — deceased

## 2018-10-21 ENCOUNTER — Other Ambulatory Visit: Payer: Self-pay

## 2018-10-22 ENCOUNTER — Encounter: Payer: Self-pay | Admitting: Radiation Therapy

## 2018-10-25 ENCOUNTER — Ambulatory Visit: Payer: Self-pay | Admitting: Radiation Oncology

## 2018-10-29 ENCOUNTER — Encounter (HOSPITAL_COMMUNITY): Payer: Self-pay

## 2018-10-29 ENCOUNTER — Ambulatory Visit (HOSPITAL_COMMUNITY): Payer: PPO

## 2018-10-29 ENCOUNTER — Ambulatory Visit (HOSPITAL_COMMUNITY): Admission: RE | Admit: 2018-10-29 | Payer: PPO | Source: Ambulatory Visit

## 2018-11-05 ENCOUNTER — Ambulatory Visit: Payer: Medicaid Other | Admitting: Adult Health

## 2019-10-16 IMAGING — CT CT HEAD W/O CM
4 series · 17 of 47 positions shown, 19 images · non-contrast
Comparison: 03/22/2018 CT head and CTA head.  02/26/2018 MRI head.

CLINICAL DATA: 62 y/o  F; follow-up of subarachnoid hemorrhage.

EXAM:
CT HEAD WITHOUT CONTRAST
TECHNIQUE: Contiguous axial images were obtained from the base of the skull
through the vertex without intravenous contrast.

[Series 3: head wo · axial · 0.42mm/px · z∈[+1181,+1306]mm · 7 of 35 slices shown, 9 images]
[im 5/35  brain]
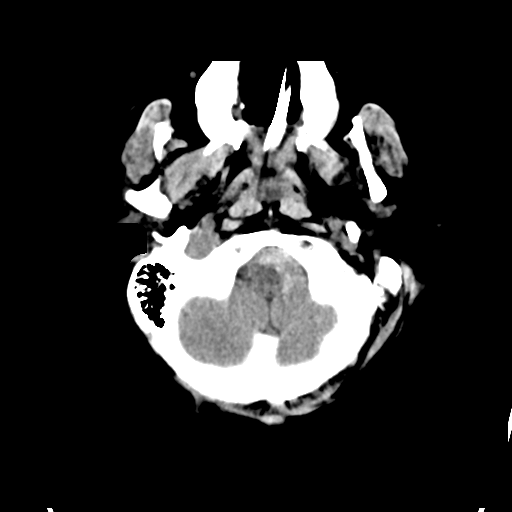
[im 5/35  bone]
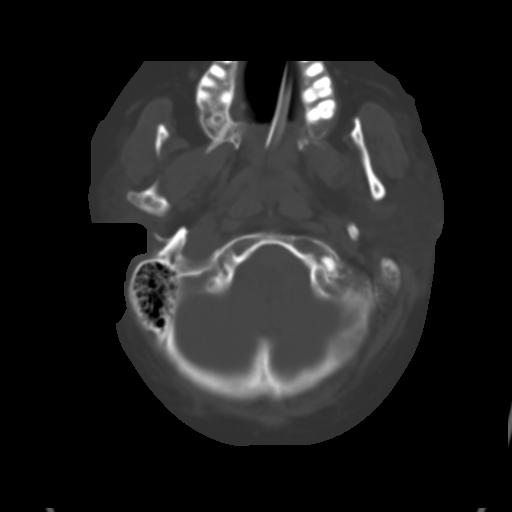
[im 9/35  brain]
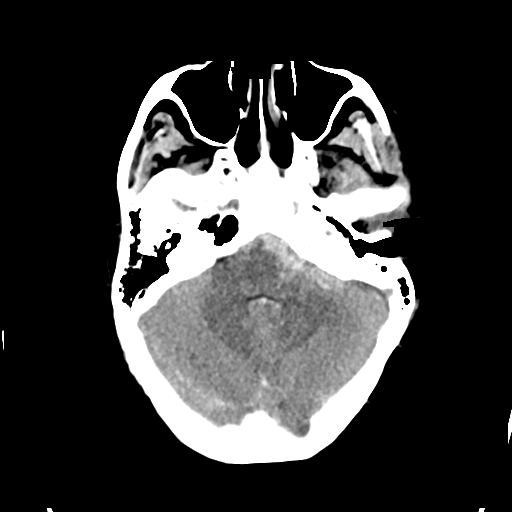
[im 13/35  brain]
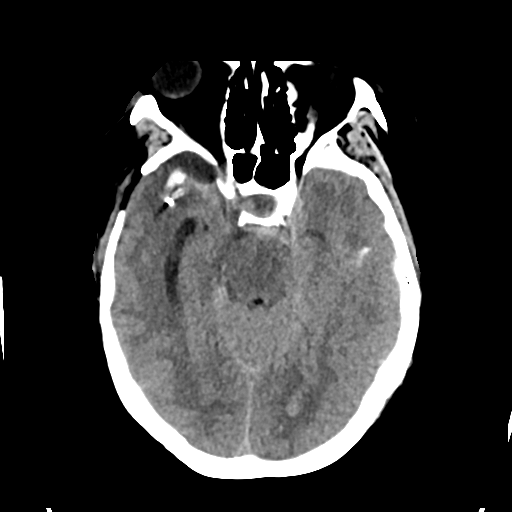
[im 18/35  brain]
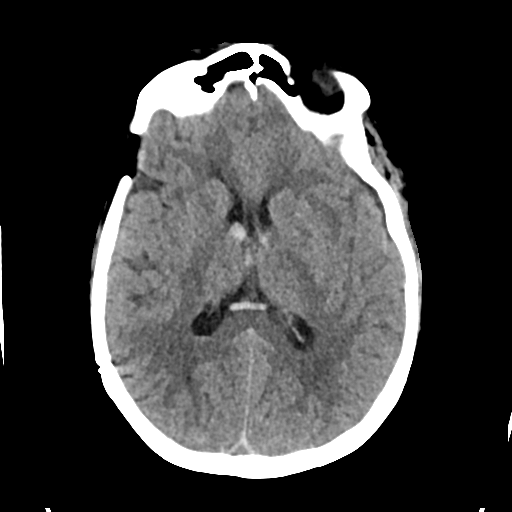
[im 22/35  brain]
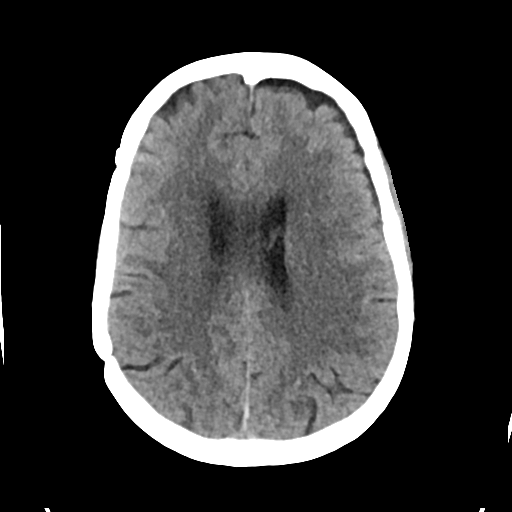
[im 22/35  bone]
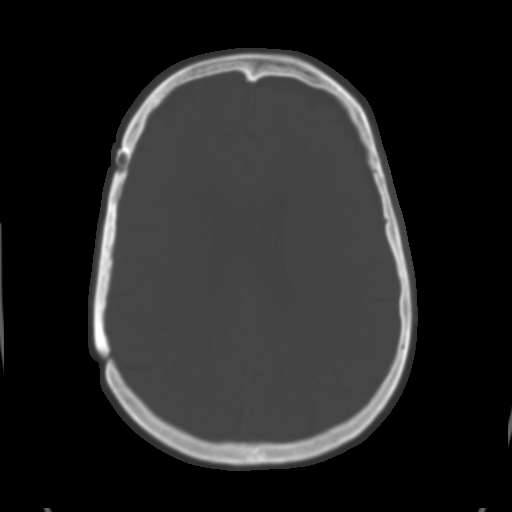
[im 26/35  brain]
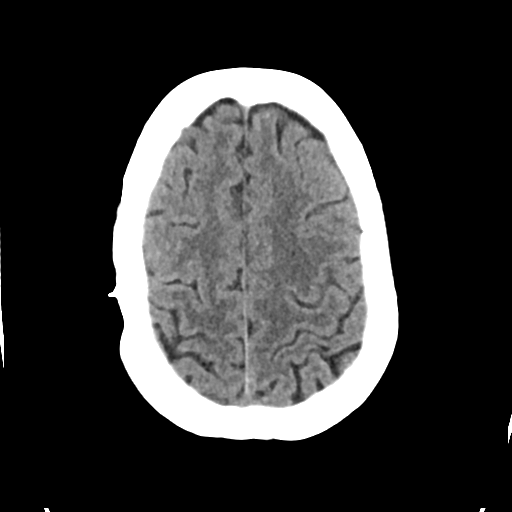
[im 30/35  brain]
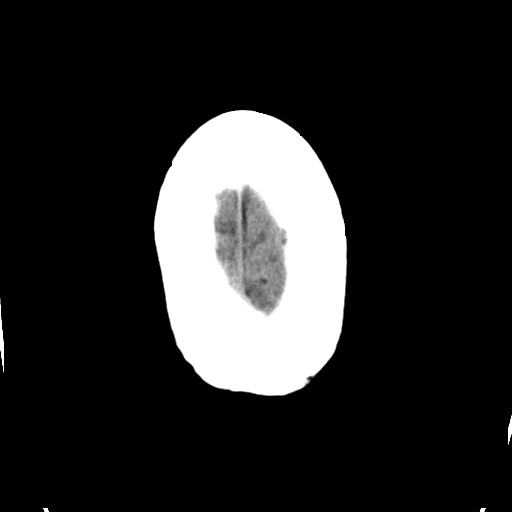

[Series 4: head bone · axial · 0.42mm/px · z∈[+1177,+1237]mm · 4 of 86 slices shown]
[im 9/86  bone]
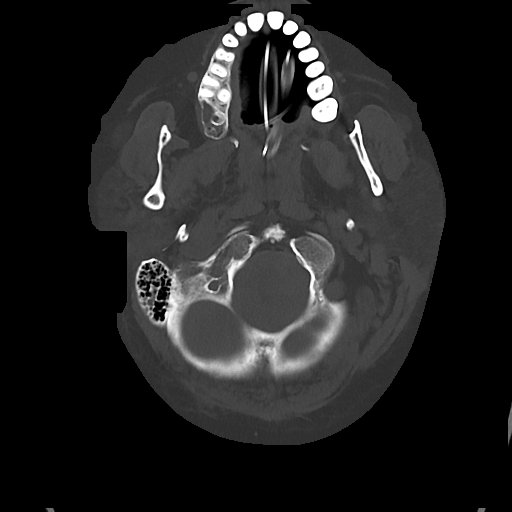
[im 18/86  bone]
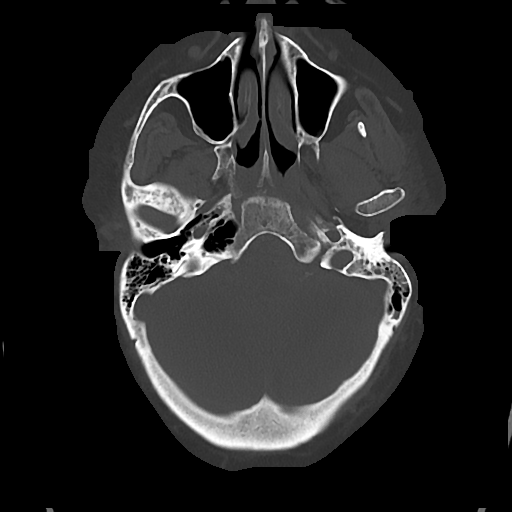
[im 26/86  bone]
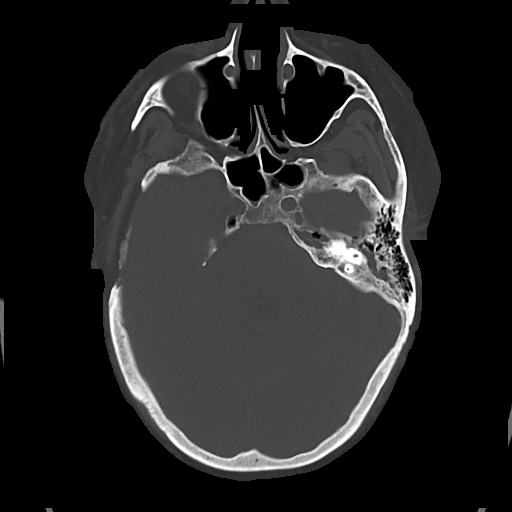
[im 39/86  bone]
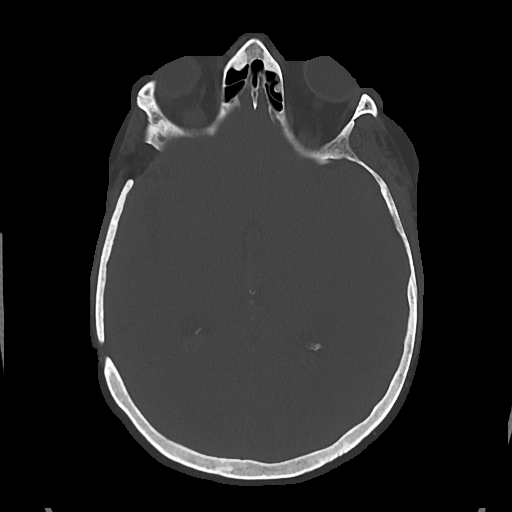

[Series 5: cor soft · coronal · 0.32mm/px · 3 of 67 slices shown]
[im 23/67  brain]
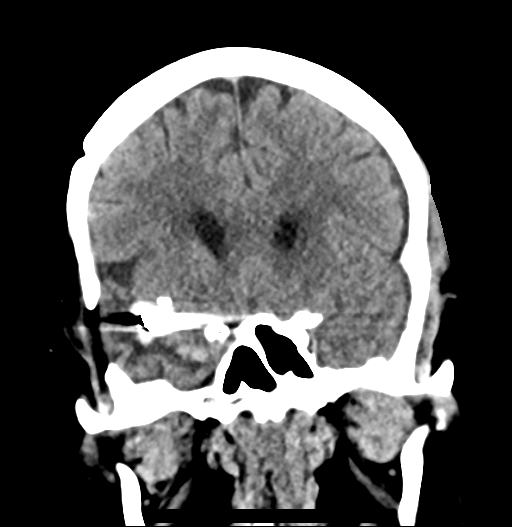
[im 30/67  brain]
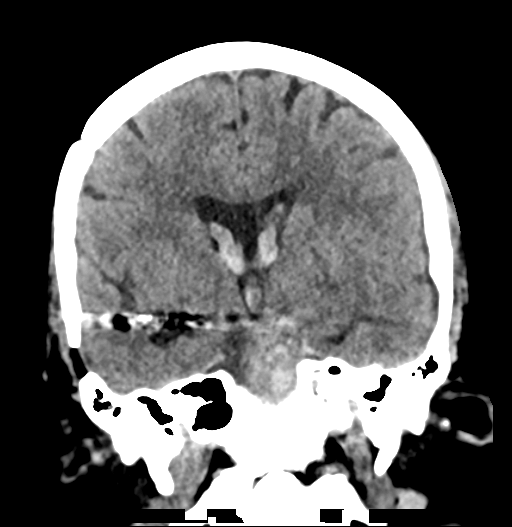
[im 37/67  brain]
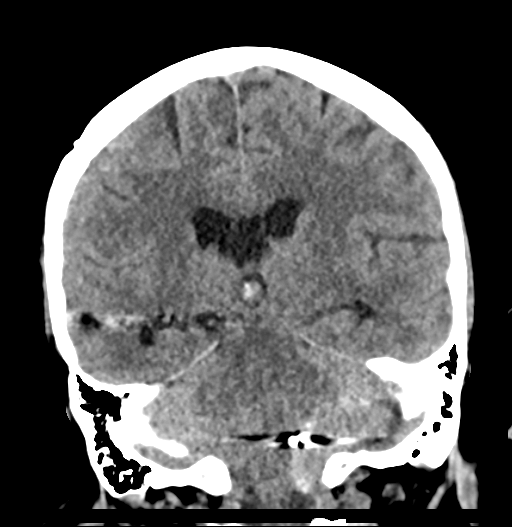

[Series 6: sag soft · sagittal · 0.33mm/px · 3 of 53 slices shown]
[im 18/53  brain]
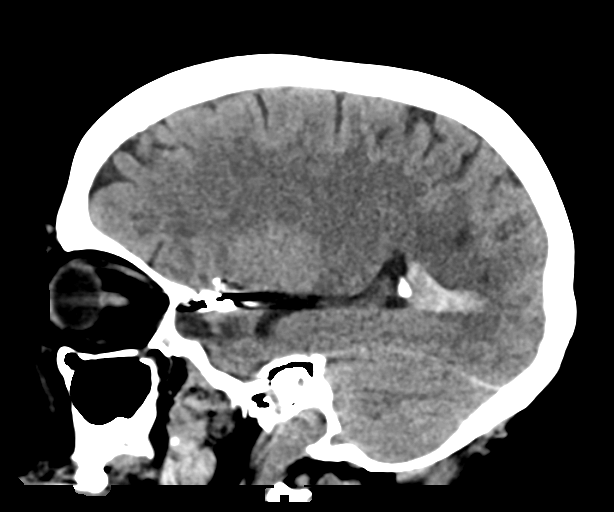
[im 27/53  brain]
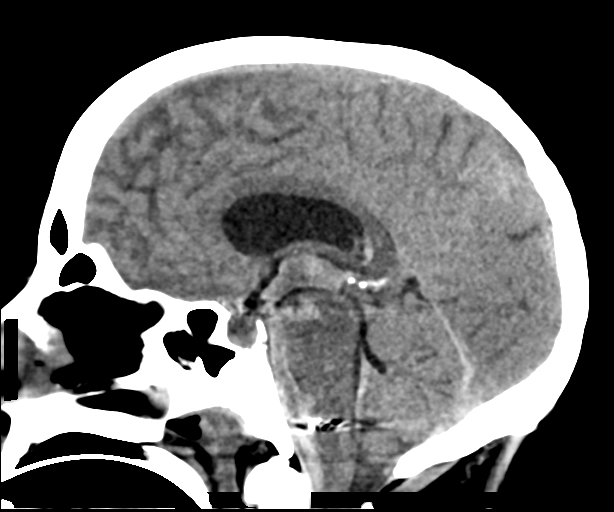
[im 35/53  brain]
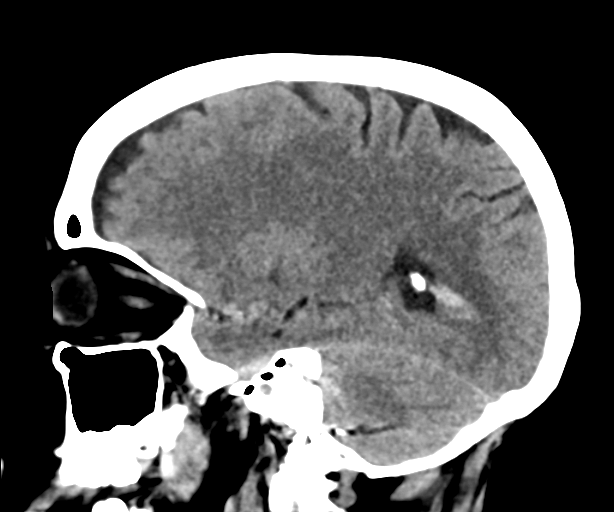

[17 of 47 positions shown; findings below may reference images not displayed]

FINDINGS: Brain: Stable intraventricular hemorrhage as well as subarachnoid
hemorrhage concentrated in left prepontine cistern and left CP angle
cistern. No new intracranial hemorrhage, stroke, or focal mass
effect. Stable ventricle size. Cavum septum pellucidum. Stable
chronic microvascular ischemic changes and volume loss of the brain.
Stable right anterior temporal lobe encephalomalacia.

Vascular: Left cavernous ICA stent as well as left PICA and right
MCA bifurcation coiled aneurysms again noted. Right P-comm and
A-comm aneurysms poorly assessed without contrast.

Skull: Stable postsurgical changes related to right lateral
craniotomy.

Sinuses/Orbits: Partial opacification of the left mastoid air cells.
Mild mucosal thickening of the maxillary sinuses. Normal aeration of
right mastoid air cells. Bilateral intra-ocular lens replacement.

Other: None.
IMPRESSION: 1. Stable subarachnoid and intraventricular hemorrhage. Stable
ventricle size. No herniation.
2. No new acute intracranial abnormality identified.

By: Nya Jumper M.D.
